# Patient Record
Sex: Female | Born: 1945
Health system: Southern US, Community
[De-identification: ages and names within clinical notes are randomized; demographics above are authoritative.]

## PROBLEM LIST (undated history)

## (undated) DIAGNOSIS — M1990 Unspecified osteoarthritis, unspecified site: Secondary | ICD-10-CM

## (undated) DIAGNOSIS — N390 Urinary tract infection, site not specified: Secondary | ICD-10-CM

## (undated) DIAGNOSIS — E039 Hypothyroidism, unspecified: Secondary | ICD-10-CM

## (undated) DIAGNOSIS — I10 Essential (primary) hypertension: Secondary | ICD-10-CM

## (undated) DIAGNOSIS — F4310 Post-traumatic stress disorder, unspecified: Secondary | ICD-10-CM

## (undated) DIAGNOSIS — I639 Cerebral infarction, unspecified: Secondary | ICD-10-CM

## (undated) DIAGNOSIS — E119 Type 2 diabetes mellitus without complications: Secondary | ICD-10-CM

## (undated) DIAGNOSIS — G4730 Sleep apnea, unspecified: Secondary | ICD-10-CM

## (undated) DIAGNOSIS — E785 Hyperlipidemia, unspecified: Secondary | ICD-10-CM

## (undated) DIAGNOSIS — F259 Schizoaffective disorder, unspecified: Secondary | ICD-10-CM

## (undated) DIAGNOSIS — J449 Chronic obstructive pulmonary disease, unspecified: Secondary | ICD-10-CM

## (undated) DIAGNOSIS — F3130 Bipolar disorder, current episode depressed, mild or moderate severity, unspecified: Secondary | ICD-10-CM

## (undated) DIAGNOSIS — K219 Gastro-esophageal reflux disease without esophagitis: Secondary | ICD-10-CM

## (undated) DIAGNOSIS — E079 Disorder of thyroid, unspecified: Secondary | ICD-10-CM

## (undated) DIAGNOSIS — D649 Anemia, unspecified: Secondary | ICD-10-CM

## (undated) HISTORY — PX: CHOLECYSTECTOMY: SHX55

## (undated) HISTORY — DX: Anemia, unspecified: D64.9

## (undated) HISTORY — PX: BREAST BIOPSY: SHX20

## (undated) HISTORY — DX: Schizoaffective disorder, unspecified: F25.9

## (undated) HISTORY — DX: Hyperlipidemia, unspecified: E78.5

## (undated) HISTORY — DX: Essential (primary) hypertension: I10

## (undated) HISTORY — PX: NASAL SINUS SURGERY: SHX719

## (undated) HISTORY — DX: Chronic obstructive pulmonary disease, unspecified: J44.9

## (undated) HISTORY — PX: COLONOSCOPY: SHX174

## (undated) HISTORY — DX: Bipolar disorder, current episode depressed, mild or moderate severity, unspecified: F31.30

## (undated) HISTORY — PX: REPLACEMENT TOTAL KNEE BILATERAL: SUR1225

## (undated) HISTORY — PX: BREAST SURGERY: SHX581

## (undated) HISTORY — DX: Type 2 diabetes mellitus without complications: E11.9

## (undated) HISTORY — PX: BILATERAL CARPAL TUNNEL RELEASE: SHX6508

## (undated) HISTORY — PX: ESOPHAGOGASTRODUODENOSCOPY: SHX1529

## (undated) HISTORY — PX: CATARACT EXTRACTION W/ INTRAOCULAR LENS  IMPLANT, BILATERAL: SHX1307

## (undated) HISTORY — PX: EYE SURGERY: SHX253

## (undated) HISTORY — DX: Post-traumatic stress disorder, unspecified: F43.10

## (undated) HISTORY — PX: JOINT REPLACEMENT: SHX530

## (undated) HISTORY — DX: Urinary tract infection, site not specified: N39.0

## (undated) HISTORY — DX: Gastro-esophageal reflux disease without esophagitis: K21.9

## (undated) HISTORY — PX: SHOULDER SURGERY: SHX246

---
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
## 2008-05-10 DIAGNOSIS — G459 Transient cerebral ischemic attack, unspecified: Secondary | ICD-10-CM

## 2008-05-10 HISTORY — DX: Transient cerebral ischemic attack, unspecified: G45.9

## 2018-05-10 HISTORY — PX: SHOULDER SURGERY: SHX246

## 2019-12-28 LAB — BASIC METABOLIC PANEL
BUN: 22 — AB (ref 4–21)
Creatinine: 0.9 (ref 0.5–1.1)
Glucose: 80
Potassium: 3.5 (ref 3.4–5.3)
Sodium: 136 — AB (ref 137–147)

## 2019-12-28 LAB — LIPID PANEL
Cholesterol: 282 — AB (ref 0–200)
HDL: 40 (ref 35–70)
LDL Cholesterol: 146
Triglycerides: 673 — AB (ref 40–160)

## 2019-12-28 LAB — CBC AND DIFFERENTIAL
HCT: 41 (ref 36–46)
Hemoglobin: 13.2 (ref 12.0–16.0)
Platelets: 421 — AB (ref 150–399)
WBC: 15.5

## 2019-12-28 LAB — HEPATIC FUNCTION PANEL
ALT: 41 — AB (ref 7–35)
AST: 31 (ref 13–35)
Alkaline Phosphatase: 29 (ref 25–125)

## 2019-12-28 LAB — TSH
TSH: 1.02 (ref 0.41–5.90)
TSH: 5.06 (ref <=5.90)

## 2019-12-28 LAB — HEMOGLOBIN A1C: Hemoglobin A1C: 6.8

## 2020-02-19 ENCOUNTER — Other Ambulatory Visit: Payer: Self-pay

## 2020-02-19 ENCOUNTER — Ambulatory Visit (INDEPENDENT_AMBULATORY_CARE_PROVIDER_SITE_OTHER): Payer: Medicare Other | Admitting: Family Medicine

## 2020-02-19 ENCOUNTER — Encounter: Payer: Self-pay | Admitting: Family Medicine

## 2020-02-19 VITALS — BP 150/80 | HR 80 | Ht 64.0 in | Wt 229.0 lb

## 2020-02-19 DIAGNOSIS — E1159 Type 2 diabetes mellitus with other circulatory complications: Secondary | ICD-10-CM

## 2020-02-19 DIAGNOSIS — F419 Anxiety disorder, unspecified: Secondary | ICD-10-CM

## 2020-02-19 DIAGNOSIS — E034 Atrophy of thyroid (acquired): Secondary | ICD-10-CM | POA: Diagnosis not present

## 2020-02-19 DIAGNOSIS — F324 Major depressive disorder, single episode, in partial remission: Secondary | ICD-10-CM

## 2020-02-19 DIAGNOSIS — D509 Iron deficiency anemia, unspecified: Secondary | ICD-10-CM

## 2020-02-19 DIAGNOSIS — M1990 Unspecified osteoarthritis, unspecified site: Secondary | ICD-10-CM

## 2020-02-19 DIAGNOSIS — J449 Chronic obstructive pulmonary disease, unspecified: Secondary | ICD-10-CM

## 2020-02-19 DIAGNOSIS — F4310 Post-traumatic stress disorder, unspecified: Secondary | ICD-10-CM

## 2020-02-19 DIAGNOSIS — K219 Gastro-esophageal reflux disease without esophagitis: Secondary | ICD-10-CM

## 2020-02-19 DIAGNOSIS — F250 Schizoaffective disorder, bipolar type: Secondary | ICD-10-CM

## 2020-02-19 DIAGNOSIS — Z7689 Persons encountering health services in other specified circumstances: Secondary | ICD-10-CM | POA: Diagnosis not present

## 2020-02-19 DIAGNOSIS — I10 Essential (primary) hypertension: Secondary | ICD-10-CM | POA: Diagnosis not present

## 2020-02-19 DIAGNOSIS — K635 Polyp of colon: Secondary | ICD-10-CM

## 2020-02-19 DIAGNOSIS — E782 Mixed hyperlipidemia: Secondary | ICD-10-CM

## 2020-02-19 DIAGNOSIS — I679 Cerebrovascular disease, unspecified: Secondary | ICD-10-CM | POA: Diagnosis not present

## 2020-02-19 DIAGNOSIS — Q453 Other congenital malformations of pancreas and pancreatic duct: Secondary | ICD-10-CM

## 2020-02-19 DIAGNOSIS — Z794 Long term (current) use of insulin: Secondary | ICD-10-CM

## 2020-02-19 DIAGNOSIS — F5101 Primary insomnia: Secondary | ICD-10-CM

## 2020-02-19 MED ORDER — AMLODIPINE BESYLATE 10 MG PO TABS: 10.0000 mg | ORAL_TABLET | Freq: Every day | ORAL | 0 refills | Status: DC

## 2020-02-19 MED ORDER — FERROUS SULFATE 325 (65 FE) MG PO TABS: 325.0000 mg | ORAL_TABLET | Freq: Every day | ORAL | 0 refills | Status: DC

## 2020-02-19 MED ORDER — LISINOPRIL 5 MG PO TABS: 5.0000 mg | ORAL_TABLET | Freq: Every day | ORAL | 0 refills | Status: DC

## 2020-02-19 MED ORDER — ALBUTEROL SULFATE HFA 108 (90 BASE) MCG/ACT IN AERS: 2.0000 | INHALATION_SPRAY | Freq: Four times a day (QID) | RESPIRATORY_TRACT | 2 refills | Status: DC | PRN

## 2020-02-19 MED ORDER — ONDANSETRON HCL 4 MG PO TABS: 4.0000 mg | ORAL_TABLET | Freq: Two times a day (BID) | ORAL | 1 refills | Status: DC

## 2020-02-19 MED ORDER — CYCLOBENZAPRINE HCL 10 MG PO TABS: 10.0000 mg | ORAL_TABLET | Freq: Three times a day (TID) | ORAL | 3 refills | Status: DC | PRN

## 2020-02-19 MED ORDER — MELOXICAM 7.5 MG PO TABS: 7.5000 mg | ORAL_TABLET | Freq: Every day | ORAL | 0 refills | Status: AC

## 2020-02-19 MED ORDER — OMEPRAZOLE 40 MG PO CPDR: 40.0000 mg | DELAYED_RELEASE_CAPSULE | Freq: Every day | ORAL | 0 refills | Status: DC

## 2020-02-19 MED ORDER — SIMVASTATIN 20 MG PO TABS: 20.0000 mg | ORAL_TABLET | Freq: Every day | ORAL | 0 refills | Status: DC

## 2020-02-19 MED ORDER — HYDROCHLOROTHIAZIDE 25 MG PO TABS: 25.0000 mg | ORAL_TABLET | Freq: Every day | ORAL | 0 refills | Status: DC

## 2020-02-19 MED ORDER — METOPROLOL SUCCINATE ER 100 MG PO TB24: 100.0000 mg | ORAL_TABLET | Freq: Every day | ORAL | 0 refills | Status: DC

## 2020-02-19 MED ORDER — FENOFIBRATE 145 MG PO TABS: 145.0000 mg | ORAL_TABLET | Freq: Every day | ORAL | 0 refills | Status: DC

## 2020-02-19 MED ORDER — LEVOTHYROXINE SODIUM 175 MCG PO TABS: 175.0000 ug | ORAL_TABLET | Freq: Every day | ORAL | 0 refills | Status: DC

## 2020-02-19 NOTE — Patient Instructions (Signed)
Carbohydrate Counting for Diabetes Mellitus, Adult  Carbohydrate counting is a method of keeping track of how many carbohydrates you eat. Eating carbohydrates naturally increases the amount of sugar (glucose) in the blood. Counting how many carbohydrates you eat helps keep your blood glucose within normal limits, which helps you manage your diabetes (diabetes mellitus). It is important to know how many carbohydrates you can safely have in each meal. This is different for every person. A diet and nutrition specialist (registered dietitian) can help you make a meal plan and calculate how many carbohydrates you should have at each meal and snack. Carbohydrates are found in the following foods:  Grains, such as breads and cereals.  Dried beans and soy products.  Starchy vegetables, such as potatoes, peas, and corn.  Fruit and fruit juices.  Milk and yogurt.  Sweets and snack foods, such as cake, cookies, candy, chips, and soft drinks. How do I count carbohydrates? There are two ways to count carbohydrates in food. You can use either of the methods or a combination of both. Reading "Nutrition Facts" on packaged food The "Nutrition Facts" list is included on the labels of almost all packaged foods and beverages in the [place] It includes:  The serving size.  Information about nutrients in each serving, including the grams (g) of carbohydrate per serving. To use the "Nutrition Facts":  Decide how many servings you will have.  Multiply the number of servings by the number of carbohydrates per serving.  The resulting number is the total amount of carbohydrates that you will be having. Learning standard serving sizes of other foods When you eat carbohydrate foods that are not packaged or do not include "Nutrition Facts" on the label, you need to measure the servings in order to count the amount of carbohydrates:  Measure the foods that you will eat with a food scale or measuring cup, if  needed.  Decide how many standard-size servings you will eat.  Multiply the number of servings by 15. Most carbohydrate-rich foods have about 15 g of carbohydrates per serving. ? For example, if you eat 8 oz (170 g) of strawberries, you will have eaten 2 servings and 30 g of carbohydrates (2 servings x 15 g = 30 g).  For foods that have more than one food mixed, such as soups and casseroles, you must count the carbohydrates in each food that is included. The following list contains standard serving sizes of common carbohydrate-rich foods. Each of these servings has about 15 g of carbohydrates:   hamburger bun or  English muffin.   oz (15 mL) syrup.   oz (14 g) jelly.  1 slice of bread.  1 six-inch tortilla.  3 oz (85 g) cooked rice or pasta.  4 oz (113 g) cooked dried beans.  4 oz (113 g) starchy vegetable, such as peas, corn, or potatoes.  4 oz (113 g) hot cereal.  4 oz (113 g) mashed potatoes or  of a large baked potato.  4 oz (113 g) canned or frozen fruit.  4 oz (120 mL) fruit juice.  4-6 crackers.  6 chicken nuggets.  6 oz (170 g) unsweetened dry cereal.  6 oz (170 g) plain fat-free yogurt or yogurt sweetened with artificial sweeteners.  8 oz (240 mL) milk.  8 oz (170 g) fresh fruit or one small piece of fruit.  24 oz (680 g) popped popcorn. Example of carbohydrate counting Sample meal  3 oz (85 g) chicken breast.  6 oz (170 g)   brown rice.  4 oz (113 g) corn.  8 oz (240 mL) milk.  8 oz (170 g) strawberries with sugar-free whipped topping. Carbohydrate calculation 1. Identify the foods that contain carbohydrates: ? Rice. ? Corn. ? Milk. ? Strawberries. 2. Calculate how many servings you have of each food: ? 2 servings rice. ? 1 serving corn. ? 1 serving milk. ? 1 serving strawberries. 3. Multiply each number of servings by 15 g: ? 2 servings rice x 15 g = 30 g. ? 1 serving corn x 15 g = 15 g. ? 1 serving milk x 15 g = 15 g. ? 1  serving strawberries x 15 g = 15 g. 4. Add together all of the amounts to find the total grams of carbohydrates eaten: ? 30 g + 15 g + 15 g + 15 g = 75 g of carbohydrates total. Summary  Carbohydrate counting is a method of keeping track of how many carbohydrates you eat.  Eating carbohydrates naturally increases the amount of sugar (glucose) in the blood.  Counting how many carbohydrates you eat helps keep your blood glucose within normal limits, which helps you manage your diabetes.  A diet and nutrition specialist (registered dietitian) can help you make a meal plan and calculate how many carbohydrates you should have at each meal and snack. This information is not intended to replace advice given to you by your health care provider. Make sure you discuss any questions you have with your health care provider. Document Revised: 11/18/2016 Document Reviewed: 10/08/2015 Elsevier Patient Education  2020 Elsevier Inc.  

## 2020-02-19 NOTE — Progress Notes (Signed)
Date:  02/19/2020   Name:  Angela Fernandez   DOB:  Jun 26, 1945   MRN:  [id number]   Chief Complaint: Establish Care  Patient is a 74 year old female who presents for a establish care exam. The patient reports the following problems: multiple. Health maintenance has been reviewed immunization.   No results found for: CREATININE, BUN, NA, K, CL, CO2 No results found for: CHOL, HDL, LDLCALC, LDLDIRECT, TRIG, CHOLHDL No results found for: TSH No results found for: HGBA1C No results found for: WBC, HGB, HCT, MCV, PLT No results found for: ALT, AST, GGT, ALKPHOS, BILITOT   Review of Systems  Constitutional: Negative.  Negative for chills, fatigue, fever and unexpected weight change.  HENT: Positive for mouth sores. Negative for congestion, ear discharge, ear pain, rhinorrhea, sinus pressure, sneezing and sore throat.   Eyes: Negative for photophobia, pain, discharge, redness and itching.  Respiratory: Negative for cough, shortness of breath, wheezing and stridor.   Cardiovascular: Negative for chest pain, palpitations and leg swelling.  Gastrointestinal: Negative for abdominal pain, blood in stool, constipation, diarrhea, nausea and vomiting.  Endocrine: Negative for cold intolerance, heat intolerance, polydipsia, polyphagia and polyuria.  Genitourinary: Negative for dysuria, flank pain, frequency, hematuria, menstrual problem, pelvic pain, urgency, vaginal bleeding and vaginal discharge.  Musculoskeletal: Positive for arthralgias and back pain. Negative for myalgias.  Skin: Negative for rash.  Allergic/Immunologic: Negative for environmental allergies and food allergies.  Neurological: Negative for dizziness, weakness, light-headedness, numbness and headaches.  Hematological: Negative for adenopathy. Does not bruise/bleed easily.  Psychiatric/Behavioral: Negative for dysphoric mood. The patient is not nervous/anxious.     There are no problems to display for this patient.   No Known  Allergies  Past Surgical History:  Procedure Laterality Date  . BREAST SURGERY     lumpectomy  . CHOLECYSTECTOMY    . SHOULDER SURGERY      Social History   Tobacco Use  . Smoking status: Former Smoker    Years: 54.00    Types: Cigarettes    Quit date: 05/10/2016    Years since quitting: 3.7  . Smokeless tobacco: Never Used  Substance Use Topics  . Alcohol use: Not Currently  . Drug use: Never     Medication list has been reviewed and updated.  Current Meds  Medication Sig  . acetaminophen (TYLENOL) 325 MG tablet Take 650 mg by mouth every 4 (four) hours as needed.  Marland Kitchen amLODipine (NORVASC) 10 MG tablet Take 10 mg by mouth daily. Take one-half tablet by mouth in the morning  . buPROPion (WELLBUTRIN SR) 200 MG 12 hr tablet Take 200 mg by mouth 2 (two) times daily. Take one and half tablet in the morning and 1 tablet in the evening/psych  . clonazePAM (KLONOPIN) 1 MG tablet Take 4.5 mg by mouth daily. Take 2 tabs in the am, 1.5 tabs at noon, and 1 tab at bedtime/ psych  . cyclobenzaprine (FLEXERIL) 10 MG tablet Take 10 mg by mouth 3 (three) times daily as needed for muscle spasms.  Marland Kitchen docusate sodium (COLACE) 100 MG capsule Take 200 mg by mouth 2 (two) times daily.  . Dulaglutide (TRULICITY) 6.31 SH/7.73YO SOPN Inject 0.75 mg into the skin once a week.  . fenofibrate (TRICOR) 145 MG tablet Take 145 mg by mouth daily.  . ferrous sulfate 325 (65 FE) MG tablet Take 325 mg by mouth daily with breakfast.  . hydrochlorothiazide (HYDRODIURIL) 25 MG tablet Take 25 mg by mouth daily.  . hydrOXYzine (  ATARAX/VISTARIL) 25 MG tablet Take 25 mg by mouth 3 (three) times daily as needed.  . Insulin Aspart (NOVOLOG FLEXPEN [place]) Inject 15 Units into the skin daily. After biggest meal of the day  . insulin glargine (LANTUS SOLOSTAR) 100 UNIT/ML Solostar Pen Inject 30 Units into the skin daily. 30 units daily into skin  . levothyroxine (SYNTHROID) 175 MCG tablet Take 175 mcg by mouth daily before  breakfast.  . lisinopril (ZESTRIL) 5 MG tablet Take 5 mg by mouth daily.  . Lurasidone HCl (LATUDA) 120 MG TABS Take 1 tablet by mouth daily. psych  . meloxicam (MOBIC) 7.5 MG tablet Take 7.5 mg by mouth daily.  . metoprolol succinate (TOPROL-XL) 100 MG 24 hr tablet Take 100 mg by mouth daily. Take with or immediately following a meal.  . Multiple Vitamins-Iron (MULTIVITAMINS WITH IRON) TABS tablet Take 1 tablet by mouth daily.  . Multiple Vitamins-Minerals (HAIR SKIN AND NAILS FORMULA PO) Take 1 tablet by mouth daily.  . naltrexone (DEPADE) 50 MG tablet Take 25 mg by mouth in the morning and at bedtime.  Marland Kitchen omeprazole (PRILOSEC) 40 MG capsule Take 40 mg by mouth daily.  . ondansetron (ZOFRAN) 4 MG tablet Take 4 mg by mouth 2 (two) times daily. One tab twice a day As needed  . promethazine (PHENERGAN) 12.5 MG tablet Take 12.5 mg by mouth every 8 (eight) hours as needed for nausea or vomiting.  . simvastatin (ZOCOR) 20 MG tablet Take 20 mg by mouth daily.  . traZODone (DESYREL) 50 MG tablet Take 50 mg by mouth at bedtime. Take one tablet at bedtime/ psych  . venlafaxine (EFFEXOR) 50 MG tablet Take 50 mg by mouth daily. 1 tablet daily/ psych  . zolpidem (AMBIEN) 10 MG tablet Take 10 mg by mouth at bedtime as needed for sleep. 1 tablet at bedtime as needed/ psych    PHQ 2/9 Scores 02/19/2020  PHQ - 2 Score 3  PHQ- 9 Score 8    GAD 7 : Generalized Anxiety Score 02/19/2020  Nervous, Anxious, on Edge 0  Control/stop worrying 1  Worry too much - different things 0  Trouble relaxing 0  Restless 0  Easily annoyed or irritable 0  Afraid - awful might happen 0  Total GAD 7 Score 1  Anxiety Difficulty Somewhat difficult    BP Readings from Last 3 Encounters:  02/19/20 (!) 160/100    Physical Exam Vitals and nursing note reviewed.  Constitutional:      General: She is not in acute distress.    Appearance: She is not diaphoretic.  HENT:     Head: Normocephalic and atraumatic.     Right  Ear: Tympanic membrane, ear canal and external ear normal.     Left Ear: Tympanic membrane, ear canal and external ear normal.     Nose: Nose normal.  Eyes:     General:        Right eye: No discharge.        Left eye: No discharge.     Conjunctiva/sclera: Conjunctivae normal.     Pupils: Pupils are equal, round, and reactive to light.  Neck:     Thyroid: No thyromegaly.     Vascular: No JVD.  Cardiovascular:     Rate and Rhythm: Normal rate and regular rhythm.     Heart sounds: Normal heart sounds. No murmur heard.  No friction rub. No gallop.   Pulmonary:     Effort: Pulmonary effort is normal.  Breath sounds: Normal breath sounds. No wheezing or rhonchi.  Abdominal:     General: Bowel sounds are normal.     Palpations: Abdomen is soft. There is no mass.     Tenderness: There is no abdominal tenderness. There is no guarding.  Musculoskeletal:        General: Normal range of motion.     Cervical back: Normal range of motion and neck supple.  Lymphadenopathy:     Cervical: No cervical adenopathy.  Skin:    General: Skin is warm and dry.  Neurological:     Mental Status: She is alert.     Deep Tendon Reflexes: Reflexes are normal and symmetric.     Wt Readings from Last 3 Encounters:  02/19/20 229 lb (103.9 kg)    BP (!) 160/100   Pulse 80   Ht 5\' 4"  (1.626 m)   Wt 229 lb (103.9 kg)   BMI 39.31 kg/m   Assessment and Plan:  1. Establishing care with new doctor, encounter for Patient chart was not available for review of previous encounters.  There were no previous labs, imaging, nor care everywhere available.  2. Type 2 diabetes mellitus with other circulatory complication, with long-term current use of insulin (HCC) Chronic.  Controlled.  Stable.  Patient is on a following regimen including Lantus 30 units nightly day.  Patient also takes NovoLog 15 units with the biggest meal the day for what may be a blood sugar greater than a certain level that has been  ascribed is at home.  Trulicity 3.38 mg/mL to inject 0.75 mg and sent once a week. - insulin glargine (LANTUS SOLOSTAR) 100 UNIT/ML Solostar Pen; Inject 30 Units into the skin daily. 30 units daily into skin - Insulin Aspart (NOVOLOG FLEXPEN [place]); Inject 15 Units into the skin daily. After biggest meal of the day - Dulaglutide (TRULICITY) 2.50 NL/9.7QB SOPN; Inject 0.75 mg into the skin once a week. - Ambulatory referral to Endocrinology  3. Cerebral vascular disease Chronic.  Controlled.  Stable.  Patient states she has a history of TIAs for which this is controlled with control of blood pressure, control of hyperlipidemia.  I am uncertain if she is currently on low-dose aspirin.  4. Hypothyroidism due to acquired atrophy of thyroid Chronic.  Controlled.  Stable.  We will continue 175 mcg levothyroxine daily. - levothyroxine (SYNTHROID) 175 MCG tablet; Take 1 tablet (175 mcg total) by mouth daily before breakfast.  Dispense: 90 tablet; Refill: 0  5. Primary hypertension Chronic.  Controlled.  Stable.  Continue lisinopril 5 mg once a day, hydrochlorothiazide 25 mg once a day, metoprolol XL 100 mg once a day, and amlodipine 10 mg 1 tablet 1/2 tablet in the morning. - amLODipine (NORVASC) 10 MG tablet; . Take one-half tablet by mouth in the morning  Dispense: 45 tablet; Refill: 0 - metoprolol succinate (TOPROL-XL) 100 MG 24 hr tablet; Take 1 tablet (100 mg total) by mouth daily. Take with or immediately following a meal.  Dispense: 90 tablet; Refill: 0 - hydrochlorothiazide (HYDRODIURIL) 25 MG tablet; Take 1 tablet (25 mg total) by mouth daily.  Dispense: 90 tablet; Refill: 0 - lisinopril (ZESTRIL) 5 MG tablet; Take 1 tablet (5 mg total) by mouth daily.  Dispense: 90 tablet; Refill: 0   6. Mixed hyperlipidemia Chronic.  Controlled.  Stable.  Continue fenofibrate 145 mg once a day and simvastatin 20 mg once a day. - fenofibrate (TRICOR) 145 MG tablet; Take 1 tablet (145 mg total) by mouth  daily.   Dispense: 90 tablet; Refill: 0 - simvastatin (ZOCOR) 20 MG tablet; Take 1 tablet (20 mg total) by mouth daily.  Dispense: 90 tablet; Refill: 0  7. Arthritis Chronic.  Controlled.  Stable.  Patient has history of shoulder pain where she had a rotator cuff injury as well as some back discomfort.  She will continue Flexeril as prescribed 3 times a day as needed and meloxicam 7.5 mg once a day. - cyclobenzaprine (FLEXERIL) 10 MG tablet; Take 1 tablet (10 mg total) by mouth 3 (three) times daily as needed for muscle spasms.  Dispense: 30 tablet; Refill: 3 - meloxicam (MOBIC) 7.5 MG tablet; Take 1 tablet (7.5 mg total) by mouth daily.  Dispense: 90 tablet; Refill: 0  8. Iron deficiency anemia, unspecified iron deficiency anemia type She with history of iron deficiency anemia for which she will continue ferrous sulfate 325 mg once every morning - ferrous sulfate 325 (65 FE) MG tablet; Take 1 tablet (325 mg total) by mouth daily with breakfast.  Dispense: 90 tablet; Refill: 0  9. Major depressive disorder in partial remission, unspecified whether recurrent [facility]) Patient with a history of depression for which she is followed by psychiatry [place].  Patient is on multiple medications and we will refer to psychiatry for continuance.  10. Anxiety Patient is on multiple medications for anxiety including controlled clonazepam.  This is been discussed with patient that this will need to be handled by psychiatry and this is been given referral information to secure a psychiatrist.  11. Schizoaffective disorder, bipolar type [facility]) Patient states she has a schizoaffective disorder and bipolar by her history.  For this she will be referred to psychiatry and information has been given for this for obtaining referral.  12. PTSD (post-traumatic stress disorder) Patient also states she has a PTSD disorder for which she will be referred to psychiatry for evaluation.  13. Primary insomnia Patient has and primary  insomnia for which she takes Ambien which is a controlled drug in which will be referred to psychiatry for further continuance as well as hydroxyzine on an as-needed basis.  Patient is also on there is a refill.  14. Chronic obstructive pulmonary disease, unspecified COPD type ([place]) Chronic.  Controlled.  Intermittent.  Patient will be given albuterol 1 to 2 puffs as needed.  15. Pancreatic ductal abnormality Patient states that she has some type of pancreatic duct abnormal for which she has been having endoscopy on yearly basis.  We will refer to gastroenterology in the meantime we are sending a release of information to her gastroenterologist in [place] for clarification.  - Ambulatory referral to Gastroenterology  16. Polyp of colon, unspecified part of colon, unspecified type There is some confusion as to why she was also getting colonoscopies on an annual basis and we will be sending for clarification with her gastroenterology in [place].  In the meantime referral has been placed with gastroenterology for continuance of surveillance. . - Ambulatory referral to Gastroenterology  17. Gastroesophageal reflux disease, unspecified whether esophagitis present Noted above patient has history of GERD and we will continue Prilosec 40 mg once a day. - ondansetron (ZOFRAN) 4 MG tablet; Take 1 tablet (4 mg total) by mouth 2 (two) times daily. One tab twice a day As needed  Dispense: 20 tablet; Refill: 1 - omeprazole (PRILOSEC) 40 MG capsule; Take 1 capsule (40 mg total) by mouth daily.  Dispense: 90 capsule; Refill: 0  I spent 60 minutes with this patient,  More than 50% of that time was spent in face to face education, counseling and care coordination.

## 2020-02-24 ENCOUNTER — Encounter: Payer: Self-pay | Admitting: Family Medicine

## 2020-02-25 ENCOUNTER — Other Ambulatory Visit: Payer: Self-pay

## 2020-02-25 DIAGNOSIS — M1990 Unspecified osteoarthritis, unspecified site: Secondary | ICD-10-CM

## 2020-02-25 DIAGNOSIS — J449 Chronic obstructive pulmonary disease, unspecified: Secondary | ICD-10-CM

## 2020-02-25 MED ORDER — AMOXICILLIN 500 MG PO CAPS: 2000.0000 mg | ORAL_CAPSULE | Freq: Every day | ORAL | 0 refills | Status: DC

## 2020-02-25 MED ORDER — TRAMADOL HCL 50 MG PO TABS: 50.0000 mg | ORAL_TABLET | Freq: Two times a day (BID) | ORAL | 0 refills | Status: AC | PRN

## 2020-02-25 MED ORDER — TRELEGY ELLIPTA 100-62.5-25 MCG/INH IN AEPB: 1.0000 | INHALATION_SPRAY | Freq: Every day | RESPIRATORY_TRACT | 0 refills | Status: DC

## 2020-02-25 NOTE — Progress Notes (Unsigned)
Added in Amox, Trellegy and Tramadol to pt's drug list- did not prescribe

## 2020-03-05 ENCOUNTER — Encounter: Payer: Self-pay | Admitting: Family Medicine

## 2020-03-06 ENCOUNTER — Other Ambulatory Visit: Payer: Self-pay

## 2020-03-06 ENCOUNTER — Telehealth: Payer: Self-pay | Admitting: Family Medicine

## 2020-03-06 NOTE — Telephone Encounter (Signed)
Pt needs a refill for one touch verio test strips / please advise and send to  [facility] [place] Phone:  [phone]  Fax:  [phone]

## 2020-03-06 NOTE — Telephone Encounter (Signed)
Spoke to pharm and faxed script for test strips to [phone]

## 2020-03-08 ENCOUNTER — Encounter: Payer: Self-pay | Admitting: Family Medicine

## 2020-03-17 ENCOUNTER — Encounter: Payer: Self-pay | Admitting: Family Medicine

## 2020-03-17 ENCOUNTER — Other Ambulatory Visit: Payer: Self-pay

## 2020-03-17 DIAGNOSIS — F324 Major depressive disorder, single episode, in partial remission: Secondary | ICD-10-CM

## 2020-03-17 DIAGNOSIS — F250 Schizoaffective disorder, bipolar type: Secondary | ICD-10-CM

## 2020-03-17 DIAGNOSIS — F419 Anxiety disorder, unspecified: Secondary | ICD-10-CM

## 2020-03-17 NOTE — Progress Notes (Unsigned)
Referral placed to [facility]

## 2020-03-21 ENCOUNTER — Telehealth: Payer: Self-pay

## 2020-03-21 ENCOUNTER — Encounter: Payer: Self-pay | Admitting: Family Medicine

## 2020-03-21 NOTE — Telephone Encounter (Signed)
I called daughter and left message concerning the [employer] message I received. I am trying to help her get her mother in somewhere. I have asked the CMA to see if they can help [place] with that. Also, I advised the daughter to see if they can get her in by getting an appt rather than a walk-in basis. Also, advised them to call customer service line on back of card and ask who is in network.

## 2020-04-12 DIAGNOSIS — R300 Dysuria: Secondary | ICD-10-CM | POA: Diagnosis not present

## 2020-04-18 ENCOUNTER — Ambulatory Visit: Payer: Medicare Other | Admitting: Family Medicine

## 2020-04-18 ENCOUNTER — Other Ambulatory Visit: Payer: Self-pay

## 2020-04-18 ENCOUNTER — Encounter: Payer: Self-pay | Admitting: Family Medicine

## 2020-04-18 ENCOUNTER — Ambulatory Visit (INDEPENDENT_AMBULATORY_CARE_PROVIDER_SITE_OTHER): Payer: Medicare Other | Admitting: Family Medicine

## 2020-04-18 VITALS — BP 110/74 | HR 69 | Ht 64.0 in | Wt 215.0 lb

## 2020-04-18 DIAGNOSIS — G8929 Other chronic pain: Secondary | ICD-10-CM

## 2020-04-18 DIAGNOSIS — E1159 Type 2 diabetes mellitus with other circulatory complications: Secondary | ICD-10-CM | POA: Diagnosis not present

## 2020-04-18 DIAGNOSIS — I10 Essential (primary) hypertension: Secondary | ICD-10-CM

## 2020-04-18 DIAGNOSIS — M25511 Pain in right shoulder: Secondary | ICD-10-CM

## 2020-04-18 DIAGNOSIS — E782 Mixed hyperlipidemia: Secondary | ICD-10-CM

## 2020-04-18 DIAGNOSIS — Z79899 Other long term (current) drug therapy: Secondary | ICD-10-CM | POA: Diagnosis not present

## 2020-04-18 DIAGNOSIS — Z794 Long term (current) use of insulin: Secondary | ICD-10-CM

## 2020-04-18 MED ORDER — GLUCAGON EMERGENCY 1 MG IJ KIT: 1.0000 | PACK | INTRAMUSCULAR | 0 refills | Status: DC | PRN

## 2020-04-18 MED ORDER — LANTUS SOLOSTAR 100 UNIT/ML ~~LOC~~ SOPN: 30.0000 [IU] | PEN_INJECTOR | Freq: Every day | SUBCUTANEOUS | 1 refills | Status: DC

## 2020-04-18 MED ORDER — TRULICITY 0.75 MG/0.5ML ~~LOC~~ SOAJ: 0.7500 mg | SUBCUTANEOUS | 0 refills | Status: DC

## 2020-04-18 NOTE — Progress Notes (Signed)
Date:  04/18/2020   Name:  Angela Fernandez   DOB:  Dec 10, 1945   MRN:  [id number]   Chief Complaint: Diabetes and Shoulder Pain (Ref to ortho)  Diabetes She presents for her follow-up diabetic visit. She has type 2 diabetes mellitus. Her disease course has been stable. There are no hypoglycemic associated symptoms. Pertinent negatives for diabetes include no chest pain, no fatigue, no foot paresthesias, no foot ulcerations, no polydipsia, no polyphagia, no polyuria, no visual change, no weakness and no weight loss. There are no hypoglycemic complications. Symptoms are stable. There are no diabetic complications. Current diabetic treatments: lantis/novalog/trulicity. She is compliant with treatment most of the time. She is following a generally healthy diet. Meal planning includes avoidance of concentrated sweets and carbohydrate counting. An ACE inhibitor/angiotensin II receptor blocker is being taken.  Shoulder Pain  The pain is present in the right shoulder. This is a chronic problem. The current episode started more than 1 year ago. The problem occurs daily. The problem has been gradually worsening (carriing groceries). The quality of the pain is described as aching. The pain is moderate. Pertinent negatives include no fever, inability to bear weight, itching, joint locking, joint swelling, limited range of motion, numbness, stiffness or tingling. The symptoms are aggravated by activity. She has tried NSAIDS for the symptoms.  Hypertension This is a chronic problem. The current episode started more than 1 year ago. The problem has been gradually improving since onset. The problem is controlled (110/74). Pertinent negatives include no chest pain or shortness of breath. Past treatments include beta blockers, calcium channel blockers, ACE inhibitors and diuretics. The current treatment provides moderate improvement.  Hyperlipidemia This is a chronic problem. The current episode started more than 1  year ago. Factors aggravating her hyperlipidemia include thiazides. Pertinent negatives include no chest pain or shortness of breath. Current antihyperlipidemic treatment includes statins. The current treatment provides moderate improvement of lipids. There are no compliance problems.  Risk factors for coronary artery disease include diabetes mellitus, dyslipidemia and hypertension.    Lab Results  Component Value Date   CREATININE 0.9 12/28/2019   BUN 22 (A) 12/28/2019   NA 136 (A) 12/28/2019   K 3.5 12/28/2019   Lab Results  Component Value Date   CHOL 282 (A) 12/28/2019   HDL 40 12/28/2019   LDLCALC 146 12/28/2019   TRIG 673 (A) 12/28/2019   Lab Results  Component Value Date   TSH 1.02 12/28/2019   TSH 5.06 12/28/2019   Lab Results  Component Value Date   HGBA1C 6.8 12/28/2019   Lab Results  Component Value Date   WBC 15.5 12/28/2019   HGB 13.2 12/28/2019   HCT 41 12/28/2019   PLT 421 (A) 12/28/2019   Lab Results  Component Value Date   ALT 41 (A) 12/28/2019   AST 31 12/28/2019   ALKPHOS 29 12/28/2019     Review of Systems  Constitutional: Negative for fatigue, fever and weight loss.  Respiratory: Negative for shortness of breath.   Cardiovascular: Negative for chest pain.  Endocrine: Negative for polydipsia, polyphagia and polyuria.  Musculoskeletal: Negative for stiffness.  Skin: Negative for itching.  Neurological: Negative for tingling, weakness and numbness.    There are no problems to display for this patient.   No Known Allergies  Past Surgical History:  Procedure Laterality Date  . BREAST SURGERY     lumpectomy  . CHOLECYSTECTOMY    . SHOULDER SURGERY      Social History  Tobacco Use  . Smoking status: Former Smoker    Years: 54.00    Types: Cigarettes    Quit date: 05/10/2016    Years since quitting: 3.9  . Smokeless tobacco: Never Used  Substance Use Topics  . Alcohol use: Not Currently  . Drug use: Never     Medication list  has been reviewed and updated.  Current Meds  Medication Sig  . acetaminophen (TYLENOL) 325 MG tablet Take 650 mg by mouth every 4 (four) hours as needed.  Marland Kitchen albuterol (VENTOLIN HFA) 108 (90 Base) MCG/ACT inhaler Inhale 2 puffs into the lungs every 6 (six) hours as needed for wheezing or shortness of breath.  Marland Kitchen amLODipine (NORVASC) 10 MG tablet Take 1 tablet (10 mg total) by mouth daily. Take one-half tablet by mouth in the morning /please give the dose is only 1/2 tablet a day and disregard 1 tablet a day.  Marland Kitchen buPROPion (WELLBUTRIN SR) 200 MG 12 hr tablet Take 1 tablet (200 mg total) by mouth 2 (two) times daily. Take 1 bid  . clonazePAM (KLONOPIN) 1 MG tablet Take 3.5 mg by mouth daily. Take 2 tabs in the am, 1.5 tabs at noon, and 1 tab at bedtime/ psych  . cyclobenzaprine (FLEXERIL) 10 MG tablet Take 1 tablet (10 mg total) by mouth 3 (three) times daily as needed for muscle spasms.  Marland Kitchen desvenlafaxine (PRISTIQ) 50 MG 24 hr tablet Take 50 mg by mouth daily.  Marland Kitchen docusate sodium (COLACE) 100 MG capsule Take 200 mg by mouth 2 (two) times daily.  . Dulaglutide (TRULICITY) 3.41 PF/7.9KW SOPN Inject 0.75 mg into the skin once a week.  . fenofibrate (TRICOR) 145 MG tablet Take 1 tablet (145 mg total) by mouth daily.  . ferrous sulfate 325 (65 FE) MG tablet Take 1 tablet (325 mg total) by mouth daily with breakfast.  . Fluticasone-Umeclidin-Vilant (TRELEGY ELLIPTA) 100-62.5-25 MCG/INH AEPB Inhale 1 puff into the lungs daily.  . hydrochlorothiazide (HYDRODIURIL) 25 MG tablet Take 1 tablet (25 mg total) by mouth daily.  . Insulin Aspart (NOVOLOG FLEXPEN [place]) Inject 15 Units into the skin daily. After biggest meal of the day  . insulin glargine (LANTUS SOLOSTAR) 100 UNIT/ML Solostar Pen Inject 30 Units into the skin daily. 30 units daily into skin  . levothyroxine (SYNTHROID) 175 MCG tablet Take 1 tablet (175 mcg total) by mouth daily before breakfast.  . lisinopril (ZESTRIL) 5 MG tablet Take 1 tablet (5 mg  total) by mouth daily.  . Lurasidone HCl (LATUDA) 120 MG TABS Take 1 tablet by mouth daily. psych  . meloxicam (MOBIC) 7.5 MG tablet Take 1 tablet (7.5 mg total) by mouth daily.  . metoprolol succinate (TOPROL-XL) 100 MG 24 hr tablet Take 1 tablet (100 mg total) by mouth daily. Take with or immediately following a meal.  . Multiple Vitamins-Iron (MULTIVITAMINS WITH IRON) TABS tablet Take 1 tablet by mouth daily.  . Multiple Vitamins-Minerals (HAIR SKIN AND NAILS FORMULA PO) Take 1 tablet by mouth daily.  Marland Kitchen omeprazole (PRILOSEC) 40 MG capsule Take 1 capsule (40 mg total) by mouth daily.  . simvastatin (ZOCOR) 20 MG tablet Take 1 tablet (20 mg total) by mouth daily.  . traZODone (DESYREL) 50 MG tablet Take 50 mg by mouth at bedtime. Take one tablet at bedtime/ psych  . zolpidem (AMBIEN) 5 MG tablet Take 5 mg by mouth at bedtime as needed for sleep. 1 tablet at bedtime as needed/ psych    [facility] 2/9 Scores 02/19/2020  PHQ -  2 Score 3  PHQ- 9 Score 8    GAD 7 : Generalized Anxiety Score 02/19/2020  Nervous, Anxious, on Edge 0  Control/stop worrying 1  Worry too much - different things 0  Trouble relaxing 0  Restless 0  Easily annoyed or irritable 0  Afraid - awful might happen 0  Total GAD 7 Score 1  Anxiety Difficulty Somewhat difficult    BP Readings from Last 3 Encounters:  04/18/20 110/74  02/19/20 (!) 150/80    Physical Exam Vitals and nursing note reviewed.  Constitutional:      General: She is not in acute distress.    Appearance: She is not diaphoretic.  HENT:     Head: Normocephalic and atraumatic.     Right Ear: External ear normal.     Left Ear: External ear normal.     Nose: Nose normal.     Mouth/Throat:     Mouth: Oropharynx is clear and moist.  Eyes:     General:        Right eye: No discharge.        Left eye: No discharge.     Extraocular Movements: EOM normal.     Conjunctiva/sclera: Conjunctivae normal.     Pupils: Pupils are equal, round, and reactive  to light.  Neck:     Thyroid: No thyromegaly.     Vascular: No JVD.  Cardiovascular:     Rate and Rhythm: Normal rate and regular rhythm.     Pulses: Intact distal pulses.     Heart sounds: Normal heart sounds. No murmur heard. No friction rub. No gallop.   Pulmonary:     Effort: Pulmonary effort is normal.     Breath sounds: Normal breath sounds. No decreased breath sounds, wheezing, rhonchi or rales.  Abdominal:     General: Bowel sounds are normal.     Palpations: Abdomen is soft. There is no mass.     Tenderness: There is no abdominal tenderness. There is no guarding.  Musculoskeletal:        General: No edema. Normal range of motion.     Cervical back: Normal range of motion and neck supple.  Lymphadenopathy:     Cervical: No cervical adenopathy.  Skin:    General: Skin is warm and dry.  Neurological:     Mental Status: She is alert.     Deep Tendon Reflexes: Reflexes are normal and symmetric.     Wt Readings from Last 3 Encounters:  04/18/20 215 lb (97.5 kg)  02/19/20 229 lb (103.9 kg)    BP 110/74   Pulse 69   Ht 5' 4" (1.626 m)   Wt 215 lb (97.5 kg)   SpO2 93%   BMI 36.90 kg/m   Assessment and Plan:  1. Type 2 diabetes mellitus with other circulatory complication, with long-term current use of insulin (HCC) Chronic.  Controlled.  Stable.  Patient is doing extremely well with blood sugars in the 1 60-1 20 range fasting.  Patient had one episode of hypoglycemia but this was probably secondary to mixing up her NovoLog with her Lantus.  We will obtain an A1c around December 20 when patient can come back to have labs charts we have been unable to obtain labs today due to timeframe.  In the meantime we will continue Trulicity 0.45 per 5 cc once a week.  Continuing her Lantus 30 units daily and NovoLog it would appear 15 units given with perhaps the most significant meal of  the day.  We will recheck patient in 4 months. - Dulaglutide (TRULICITY) 6.21 HY/8.6VH SOPN;  Inject 0.75 mg into the skin once a week.  Dispense: 2 mL; Refill: 0 - insulin glargine (LANTUS SOLOSTAR) 100 UNIT/ML Solostar Pen; Inject 30 Units into the skin daily. 30 units daily into skin  Dispense: 15 mL; Refill: 1 - Glucagon, rDNA, (GLUCAGON EMERGENCY) 1 MG KIT; Inject 1 Device as directed as needed (hypoglycemia).  Dispense: 1 kit; Refill: 0  2. Chronic right shoulder pain Chronic.  Uncontrolled pain.  Limited range of motion.  Patient had a previous surgery for which she was rehab however while carrying groceries upstairs she may have reinjured this sometime ago.  Currently patient has pain for which previously she has been on tramadol and meloxicam and Flexeril.  I have instructed them to discontinue her Flexeril but I do not think that there is a significant muscle spasm component and this is only contributing to her risk for falls and sedation. - Ambulatory referral to Orthopedic Surgery  3. Primary hypertension Chronic.  Controlled.  Stable.  Patient is on metoprolol XL 100 mg 1 a day.  Amlodipine 10 mg once a day.  Hydrochlorothiazide previously on 25 mg once a day.  But this has been changed, and lisinopril 5 mg once a day.  Blood pressure is doing extremely well today at 110/74 and patient has had about a 14 pound weight loss since last seen.  I am concerned about the possibility of prerenal azotemia and mucous membranes are dry today and I suggest that we will have her hydrochlorothiazide dosage to 12.5 mg a day and we will recheck in 4 months.  4. Mixed hyperlipidemia Chronic.  Controlled.  Stable.  Continue on simvastatin 20 mg once a day.  And fenofibrate 145 mg once a day.  We will check a lipid panel on December 20 when she returns in fasting.  5. Polypharmacy As noted polypharmacy is a concern particularly with her psychotropic meds.  I am concerned about sedation but from my standpoint I will stop the Flexeril I do not see the advantage of this and see more of a negative  concerned than a positive return with this.  I do have concerns with her driving of told her I do not think she should be driving and that she should not be living by herself at this time until we have a stabilization of her psychotropic medications.  I spent 60 minutes with this patient, More than 50% of that time was spent in face to face education, counseling and care coordination.

## 2020-04-21 ENCOUNTER — Telehealth: Payer: Self-pay | Admitting: Family Medicine

## 2020-04-21 NOTE — Telephone Encounter (Signed)
Left message for patient to call back and schedule Medicare Annual Wellness Visit (AWV) either virtually or in office. Whichever the patients preference is.  No history of AWV; please schedule at anytime with [facility] Health Advisor.  This should be a 40 minute visit  AWV-I PER [place] AS OF 05/10/2009

## 2020-04-28 ENCOUNTER — Other Ambulatory Visit: Payer: Self-pay

## 2020-04-28 ENCOUNTER — Other Ambulatory Visit: Payer: Medicare Other

## 2020-04-28 DIAGNOSIS — I10 Essential (primary) hypertension: Secondary | ICD-10-CM

## 2020-04-28 DIAGNOSIS — E1159 Type 2 diabetes mellitus with other circulatory complications: Secondary | ICD-10-CM | POA: Diagnosis not present

## 2020-04-28 DIAGNOSIS — E034 Atrophy of thyroid (acquired): Secondary | ICD-10-CM

## 2020-04-28 DIAGNOSIS — E782 Mixed hyperlipidemia: Secondary | ICD-10-CM | POA: Diagnosis not present

## 2020-04-28 DIAGNOSIS — Z794 Long term (current) use of insulin: Secondary | ICD-10-CM | POA: Diagnosis not present

## 2020-04-29 ENCOUNTER — Other Ambulatory Visit: Payer: Self-pay

## 2020-04-29 ENCOUNTER — Telehealth: Payer: Self-pay

## 2020-04-29 ENCOUNTER — Encounter: Payer: Self-pay | Admitting: Family Medicine

## 2020-04-29 LAB — RENAL FUNCTION PANEL
Albumin: 4.7 g/dL (ref 3.7–4.7)
BUN/Creatinine Ratio: 18 (ref 12–28)
BUN: 18 mg/dL (ref 8–27)
CO2: 22 mmol/L (ref 20–29)
Calcium: 10.2 mg/dL (ref 8.7–10.3)
Chloride: 100 mmol/L (ref 96–106)
Creatinine, Ser: 1.01 mg/dL — ABNORMAL HIGH (ref 0.57–1.00)
GFR calc Af Amer: 64 mL/min/{1.73_m2} (ref >59)
GFR calc non Af Amer: 56 mL/min/{1.73_m2} — ABNORMAL LOW (ref >59)
Glucose: 88 mg/dL (ref 65–99)
Phosphorus: 4.7 mg/dL — ABNORMAL HIGH (ref 3.0–4.3)
Potassium: 4.7 mmol/L (ref 3.5–5.2)
Sodium: 139 mmol/L (ref 134–144)

## 2020-04-29 LAB — HEMOGLOBIN A1C
Est. average glucose Bld gHb Est-mCnc: 120 mg/dL
Hgb A1c MFr Bld: 5.8 % — ABNORMAL HIGH (ref 4.8–5.6)

## 2020-04-29 LAB — THYROID PANEL WITH TSH
Free Thyroxine Index: 2.6 (ref 1.2–4.9)
T3 Uptake Ratio: 27 % (ref 24–39)
T4, Total: 9.7 ug/dL (ref 4.5–12.0)
TSH: 0.599 u[IU]/mL (ref 0.450–4.500)

## 2020-04-29 LAB — LIPID PANEL WITH LDL/HDL RATIO
Cholesterol, Total: 182 mg/dL (ref 100–199)
HDL: 43 mg/dL (ref >39)
LDL Chol Calc (NIH): 98 mg/dL (ref 0–99)
LDL/HDL Ratio: 2.3 ratio (ref 0.0–3.2)
Triglycerides: 241 mg/dL — ABNORMAL HIGH (ref 0–149)
VLDL Cholesterol Cal: 41 mg/dL — ABNORMAL HIGH (ref 5–40)

## 2020-04-29 NOTE — Telephone Encounter (Signed)
Spoke to [employer]- no changes right now- will let us know if the med list she receives on her mom shows any changes

## 2020-04-29 NOTE — Telephone Encounter (Unsigned)
Copied from [place]. Topic: Quick Communication - See Telephone Encounter >> Apr 29, 2020  2:37 PM Loma Boston wrote: CRM for notification. See Telephone encounter for: 04/29/20.Pt Has called and states Dr Lenna Sciara changed her meds and she can't remember what and how much and is requesting that Baxter Flattery call her daughter one day this week as daughter is off and will be able to take the call. Daughter wants to understand med changes as she gives her mom her meds. [name], on DPR call this week at [phone].

## 2020-04-30 ENCOUNTER — Encounter: Payer: Self-pay | Admitting: Gastroenterology

## 2020-04-30 ENCOUNTER — Other Ambulatory Visit: Payer: Self-pay

## 2020-04-30 ENCOUNTER — Ambulatory Visit (INDEPENDENT_AMBULATORY_CARE_PROVIDER_SITE_OTHER): Payer: Medicare Other | Admitting: Gastroenterology

## 2020-04-30 VITALS — BP 129/61 | HR 64 | Temp 97.5°F | Ht 64.0 in | Wt 217.4 lb

## 2020-04-30 DIAGNOSIS — R1319 Other dysphagia: Secondary | ICD-10-CM | POA: Diagnosis not present

## 2020-04-30 NOTE — Patient Instructions (Signed)
Please go to the [facility] on 05/19/2020 at 12:30 PM. You are able to eat a regular breakfast and a light lunch. If this date and time does not work for you, please call [phone].  Merry Christmas!

## 2020-05-05 NOTE — Progress Notes (Signed)
Angela Fernandez [address]  [place]  [place], [place] [address]  Main: [phone]  Fax: [phone]   Gastroenterology Consultation  Referring Provider:     Juline Patch, MD Primary Care Physician:  Juline Patch, MD Reason for Consultation:            HPI:    Chief Complaint  Patient presents with  . pancreatic ductal abnormality    Angela Fernandez is a 74 y.o. y/o female referred for consultation & management  by Dr. Juline Patch, MD.    September 2021 colonoscopy with 3 mm rectal polyp removed.  Diverticulosis and internal hemorrhoids reported.  Pathology shows hyperplastic polyp.  Upper endoscopy in September 2021 which was done for indications "surveillance for malignancy due to personal history of Barrett's esophagus, follow-up of polyps in the duodenum" and reports short segment Barrett's, small post mucosectomy scar in the ampulla that appeared healthy, no evidence of previous polyp.  Repeat upper endoscopy recommended in 3 years for surveillance.  There is also a pathology report from the distal esophagus from September 2021 that shows intestinal metaplasia.  Patient reports dysphagia to solids and liquids.  No weight loss.  No nausea or vomiting.  No blood in stool.  Past Medical History:  Diagnosis Date  . Anemia   . Bipolar affect, depressed ([place])   . COPD (chronic obstructive pulmonary disease) ([place])   . Diabetes mellitus without complication ([place])   . GERD (gastroesophageal reflux disease)   . Hyperlipidemia   . Hypertension   . PTSD (post-traumatic stress disorder)   . Schizoaffective disorder ([place])   . Thyroid disease     Past Surgical History:  Procedure Laterality Date  . BREAST SURGERY     lumpectomy  . CHOLECYSTECTOMY    . SHOULDER SURGERY      Prior to Admission medications   Medication Sig Start Date End Date Taking? Authorizing Provider  acetaminophen (TYLENOL) 325 MG tablet Take 650 mg by mouth every 4 (four) hours as  needed.   Yes [provider]  albuterol (VENTOLIN HFA) 108 (90 Base) MCG/ACT inhaler Inhale 2 puffs into the lungs every 6 (six) hours as needed for wheezing or shortness of breath. 02/19/20  Yes Juline Patch, MD  amLODipine (NORVASC) 10 MG tablet Take 1 tablet (10 mg total) by mouth daily. Take one-half tablet by mouth in the morning /please give the dose is only 1/2 tablet a day and disregard 1 tablet a day. 02/19/20  Yes Juline Patch, MD  buPROPion (WELLBUTRIN SR) 200 MG 12 hr tablet Take 1 tablet (200 mg total) by mouth 2 (two) times daily. Take 1 bid   Yes [provider]  clonazePAM (KLONOPIN) 1 MG tablet Take 3.5 mg by mouth daily. Take 2 tabs in the am, 1.5 tabs at noon, and 1 tab at bedtime/ psych   Yes [provider]  desvenlafaxine (PRISTIQ) 50 MG 24 hr tablet Take 50 mg by mouth daily.   Yes [provider]  docusate sodium (COLACE) 100 MG capsule Take 200 mg by mouth 2 (two) times daily.   Yes [provider]  Dulaglutide (TRULICITY) 3.87 FI/4.3PI SOPN Inject 0.75 mg into the skin once a week. 04/18/20  Yes Juline Patch, MD  fenofibrate (TRICOR) 145 MG tablet Take 1 tablet (145 mg total) by mouth daily. 02/19/20  Yes Juline Patch, MD  ferrous sulfate 325 (65 FE) MG tablet Take 1 tablet (325 mg total)  by mouth daily with breakfast. 02/19/20  Yes Juline Patch, MD  Fluticasone-Umeclidin-Vilant (TRELEGY ELLIPTA) 100-62.5-25 MCG/INH AEPB Inhale 1 puff into the lungs daily. 02/25/20  Yes Juline Patch, MD  Glucagon, rDNA, (GLUCAGON EMERGENCY) 1 MG KIT Inject 1 Device as directed as needed (hypoglycemia). 04/18/20  Yes Juline Patch, MD  hydrochlorothiazide (HYDRODIURIL) 25 MG tablet Take 1 tablet (25 mg total) by mouth daily. 02/19/20  Yes Juline Patch, MD  Insulin Aspart (NOVOLOG FLEXPEN [place]) Inject 15 Units into the skin daily. After biggest meal of the day   Yes [provider]  insulin glargine (LANTUS SOLOSTAR) 100  UNIT/ML Solostar Pen Inject 30 Units into the skin daily. 30 units daily into skin 04/18/20  Yes Juline Patch, MD  lactulose [facility]) 10 GM/15ML solution Take by mouth. 02/19/20  Yes [provider]  levothyroxine (SYNTHROID) 175 MCG tablet Take 1 tablet (175 mcg total) by mouth daily before breakfast. 02/19/20  Yes Juline Patch, MD  lisinopril (ZESTRIL) 5 MG tablet Take 1 tablet (5 mg total) by mouth daily. 02/19/20  Yes Juline Patch, MD  Lurasidone HCl (LATUDA) 120 MG TABS Take 1 tablet by mouth daily. psych   Yes [provider]  meloxicam (MOBIC) 7.5 MG tablet Take 1 tablet (7.5 mg total) by mouth daily. 02/19/20  Yes Juline Patch, MD  metoprolol succinate (TOPROL-XL) 100 MG 24 hr tablet Take 1 tablet (100 mg total) by mouth daily. Take with or immediately following a meal. 02/19/20  Yes Juline Patch, MD  Multiple Vitamins-Iron (MULTIVITAMINS WITH IRON) TABS tablet Take 1 tablet by mouth daily.   Yes [provider]  Multiple Vitamins-Minerals (HAIR SKIN AND NAILS FORMULA PO) Take 1 tablet by mouth daily.   Yes [provider]  omeprazole (PRILOSEC) 40 MG capsule Take 1 capsule (40 mg total) by mouth daily. 02/19/20  Yes Juline Patch, MD  simvastatin (ZOCOR) 20 MG tablet Take 1 tablet (20 mg total) by mouth daily. 02/19/20  Yes Juline Patch, MD  sulfamethoxazole-trimethoprim (BACTRIM DS) 800-160 MG tablet Take 1 tablet by mouth 2 (two) times daily. 04/12/20  Yes [provider]  traZODone (DESYREL) 50 MG tablet Take 50 mg by mouth at bedtime. Take one tablet at bedtime/ psych   Yes [provider]  zolpidem (AMBIEN) 10 MG tablet Take 10 mg by mouth at bedtime as needed. 02/21/20  Yes [provider]    History reviewed. No pertinent family history.   Social History   Tobacco Use  . Smoking status: Former Smoker    Years: 54.00    Types: Cigarettes    Quit date: 05/10/2016    Years since quitting: 3.9  .  Smokeless tobacco: Never Used  Substance Use Topics  . Alcohol use: Not Currently  . Drug use: Never    Allergies as of 04/30/2020  . (No Known Allergies)    Review of Systems:    All systems reviewed and negative except where noted in HPI.   Physical Exam:  BP 129/61   Pulse 64   Temp (!) 97.5 F (36.4 C) (Oral)   Ht 5' 4" (1.626 m)   Wt 217 lb 6.4 oz (98.6 kg)   BMI 37.32 kg/m  No LMP recorded. Psych:  Alert and cooperative. Normal mood and affect. General:   Alert,  Well-developed, well-nourished, pleasant and cooperative in NAD Head:  Normocephalic and atraumatic. Eyes:  Sclera clear, no icterus.   Conjunctiva pink.  Ears:  Normal auditory acuity. Nose:  No deformity, discharge, or lesions. Mouth:  No deformity or lesions,oropharynx pink & moist. Neck:  Supple; no masses or thyromegaly. Abdomen:  Normal bowel sounds.  No bruits.  Soft, non-tender and non-distended without masses, hepatosplenomegaly or hernias noted.  No guarding or rebound tenderness.    Msk:  Symmetrical without gross deformities. Good, equal movement & strength bilaterally. Pulses:  Normal pulses noted. Extremities:  No clubbing or edema.  No cyanosis. Neurologic:  Alert and oriented x3;  grossly normal neurologically. Skin:  Intact without significant lesions or rashes. No jaundice. Lymph Nodes:  No significant cervical adenopathy. Psych:  Alert and cooperative. Normal mood and affect.   Labs: CBC    Component Value Date/Time   WBC 15.5 12/28/2019 0000   HGB 13.2 12/28/2019 0000   HCT 41 12/28/2019 0000   PLT 421 (A) 12/28/2019 0000   CMP     Component Value Date/Time   NA 139 04/28/2020 1100   K 4.7 04/28/2020 1100   CL 100 04/28/2020 1100   CO2 22 04/28/2020 1100   GLUCOSE 88 04/28/2020 1100   BUN 18 04/28/2020 1100   CREATININE 1.01 (H) 04/28/2020 1100   CALCIUM 10.2 04/28/2020 1100   ALBUMIN 4.7 04/28/2020 1100   AST 31 12/28/2019 0000   ALT 41 (A) 12/28/2019 0000   ALKPHOS 29  12/28/2019 0000   GFRNONAA 56 (L) 04/28/2020 1100   GFRAA 64 04/28/2020 1100    Imaging Studies: No results found.  Assessment and Plan:   Angela Fernandez is a 74 y.o. y/o female has been referred for "pancreatic ductal abnormality"  I do not have any office reports from her previous gastroenterologist.  Patient is moving here locally and reestablishing GI care here  I will need to obtain her previous GI office notes to decipher if there is in fact a "pancreatic ductal abnormality" mentioned in their office notes.  I suspect that this may be referring to her previously known duodenal polyps that have been removed by her previous GIs.  Given her very recent endoscopies this year, she is not in need of another endoscopy  we will obtain swallow study for her dysphagia as her upper endoscopy was normal  We will await records from her previous gastroenterologist  Dr Angela Fernandez  Speech recognition software was used to dictate the above note.

## 2020-05-13 ENCOUNTER — Encounter: Payer: Self-pay | Admitting: Gastroenterology

## 2020-05-13 NOTE — Progress Notes (Unsigned)
Previous records obtained, office note from May 2021 by [facility] gastroenterology consultants.  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  Patient did undergo surveillance with EGD/ERCP  Per patient's latest endoscopy report from September 2021 surveillance is recommended in 3 years  There is no mention of any other pancreatic duct abnormality on the GI notes  Patient to follow-up in clinic in the next few months to discuss any ongoing reflux issues given her history of Barrett's and discussed possibly lowering her Prilosec dose to lowest dose possible  Can also discuss with patient if they would like to continue follow-up with me or switch to gastroenterologist in [place] when it is time for her surveillance EGD/ERCP in 3 years

## 2020-05-19 ENCOUNTER — Ambulatory Visit
Admission: RE | Admit: 2020-05-19 | Discharge: 2020-05-19 | Disposition: A | Discharge status: Home / Self Care | Payer: Medicare Other | Source: Ambulatory Visit | Attending: Gastroenterology | Admitting: Gastroenterology

## 2020-05-19 ENCOUNTER — Other Ambulatory Visit: Payer: Self-pay

## 2020-05-19 DIAGNOSIS — R1319 Other dysphagia: Secondary | ICD-10-CM | POA: Diagnosis not present

## 2020-05-19 DIAGNOSIS — R1310 Dysphagia, unspecified: Secondary | ICD-10-CM | POA: Diagnosis not present

## 2020-05-19 IMAGING — RF DG SWALLOWING FUNCTION
1 series · 1 of 1 positions shown · non-contrast
Comparison: None.

CLINICAL DATA: Dysphagia.

EXAM:
MODIFIED BARIUM SWALLOW
TECHNIQUE: Different consistencies of barium were administered orally to the
patient by the Speech Pathologist. Imaging of the pharynx was
performed in the lateral projection. The radiologist was present in
the fluoroscopy room for this study, providing personal supervision.
FLUOROSCOPY TIME:  Fluoroscopy Time:  48 seconds
Radiation Exposure Index (if provided by the fluoroscopic device):
3.9 mGy
Number of Acquired Spot Images: 0

[Series 1: cp_standard · 0.25mm/px · 1 of 1 slices shown]
[im 1/1]
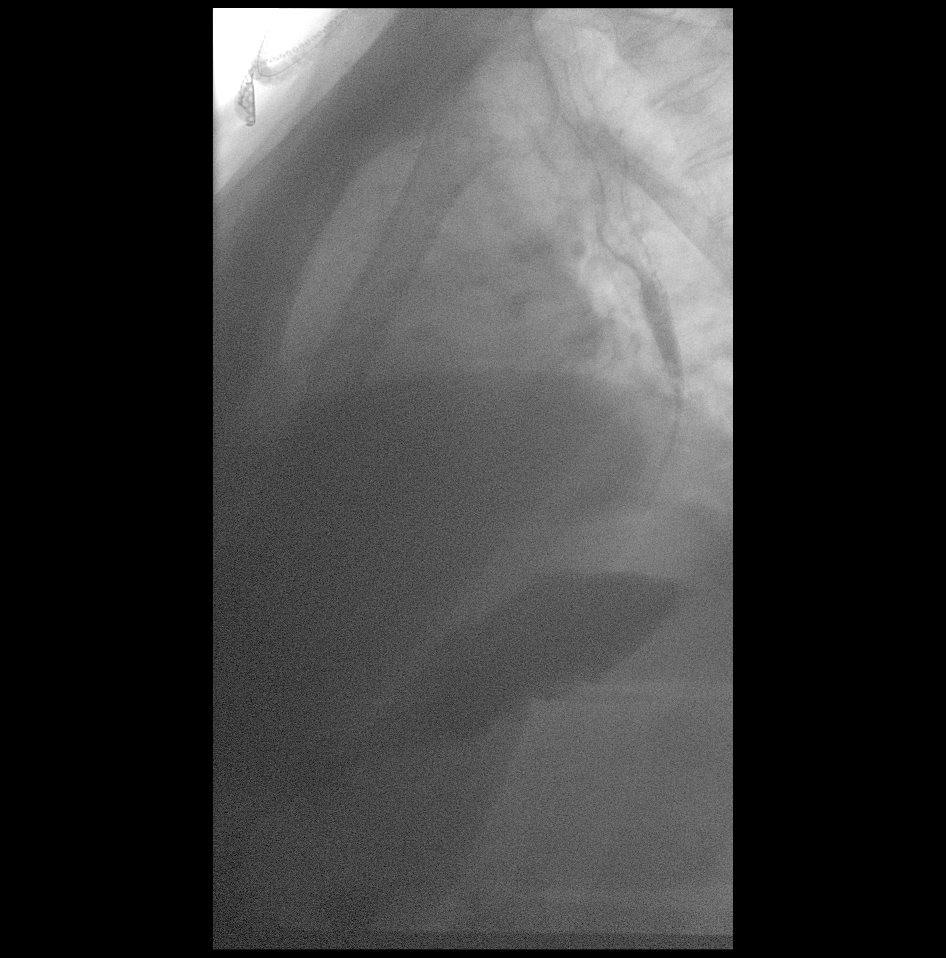

[1 of 1 positions shown; findings below may reference images not displayed]

FINDINGS: Real-time fluoroscopy of the swallowing function was performed with
a speech pathologist present.

Multiple consistencies of barium were administered which included
water, nectar, applesauce, STEFANO cracker, and barium tablet.

Normal swallow function. No laryngeal penetration or tracheal
aspiration.
IMPRESSION: Modified barium swallow as described above.

Please refer to the Speech Pathologists report for complete details
and recommendations.

## 2020-05-19 NOTE — Therapy (Signed)
[place]ela [place], [place], [address] Phone: [phone]   Fax:     Modified Barium Swallow  Patient Details  Name: Angela Fernandez MRN: [id number] Date of Birth: [birth year] No data recorded  Encounter Date: 05/19/2020   End of Session - 05/19/20 1349    Visit Number 1    Number of Visits 1    Date for SLP Re-Evaluation 05/19/20    SLP Start Time 3532    SLP Stop Time  1330    SLP Time Calculation (min) 25 min    Activity Tolerance Patient tolerated treatment well              Subjective Assessment - 05/19/20 1338    Subjective "When I eat bread or something dry, it gets caught in my throat and I cough."    Currently in Pain? No/denies              Objective Swallowing Evaluation: Type of Study: MBS-Modified Barium Swallow Study   Patient Details  Name: Angela Fernandez MRN: [id number] Date of Birth: 02/28/1946  Today's Date: 05/19/2020 Time: No data recorded-No data recorded No data recorded  Past Medical History:  Past Medical History:  Diagnosis Date  . Anemia   . Bipolar affect, depressed ([place])   . COPD (chronic obstructive pulmonary disease) ([place])   . Diabetes mellitus without complication ([place])   . GERD (gastroesophageal reflux disease)   . Hyperlipidemia   . Hypertension   . PTSD (post-traumatic stress disorder)   . Schizoaffective disorder ([place])   . Thyroid disease    Past Surgical History:  Past Surgical History:  Procedure Laterality Date  . BREAST SURGERY     lumpectomy  . CHOLECYSTECTOMY    . SHOULDER SURGERY     HPI: Angela Fernandez is a 75 y.o. female referred by her GI, Vonda Antigua, MD for Modified Barium Swallow due to reports of dysphagia. Past medical history is noted for bipolar, COPD, DM, GERD, HLD, HTN, PTSD, Schizoaffective disorder, Thyroid disease. Patient recently relocated to the area from [place]. She reports her "throat was stretched,"  several years ago; no records of any past esophageal dilation are available in EPIC. Her chief complaint is feeling that bread and dry foods become "stuck" about the area of her thyroid notch, causing her to cough.   Subjective: cooperative, pleasant    Assessment / Plan / Recommendation  CHL IP CLINICAL IMPRESSIONS 05/19/2020  Clinical Impression Patient presents with grossly functional oropharyngeal swallowing. Oral phase is characterized by adequate oral control, bolus formation, and  nterior to posterior transfer. Pharyngeal swallow initiation is WFL at the level of the valleculae. Pharyngeal stage is characterized by adequate tongue base retraction, hyolaryngeal excursion, and pharyngeal constriction. Epiglottic deflection and airway protection are complete; there is no observed laryngeal penetration or tracheal aspiration. There is no abnormal residue remaining after the swallow. Amplitude and duration of cricopharyngeus opening appear within normal limits. View of pt's cervical esophagus was limited due to shoulder positioning. GI may wish to consider further assessment of motility. Recommend regular diet and thin liquids, meds whole with liquid. Discussed with patient after the study some behavioral modifications that may reduce symptoms, such as eating smaller, more frequent meals, alternating solids with liquids, and remaining upright after eating and drinking.  SLP Visit Diagnosis Dysphagia, pharyngoesophageal phase (R13.14)  Attention and concentration deficit following --  Frontal lobe and executive function deficit following --  Impact on safety  and function Mild aspiration risk      CHL IP TREATMENT RECOMMENDATION 05/19/2020  Treatment Recommendations No treatment recommended at this time     Prognosis 05/19/2020  Prognosis for Safe Diet Advancement Good  Barriers to Reach Goals --  Barriers/Prognosis Comment --    CHL IP DIET RECOMMENDATION 05/19/2020  SLP Diet Recommendations  Regular solids;Thin liquid  Liquid Administration via Cup  Medication Administration Whole meds with liquid  Compensations Slow rate;Small sips/bites;Follow solids with liquid  Postural Changes Seated upright at 90 degrees;Remain semi-upright after after feeds/meals (Comment)      CHL IP OTHER RECOMMENDATIONS 05/19/2020  Recommended Consults Other (Comment)  Oral Care Recommendations Oral care BID  Other Recommendations --      CHL IP FOLLOW UP RECOMMENDATIONS 05/19/2020  Follow up Recommendations None      No flowsheet data found.         CHL IP ORAL PHASE 05/19/2020  Oral Phase WFL  Oral - Pudding Teaspoon --  Oral - Pudding Cup --  Oral - Honey Teaspoon --  Oral - Honey Cup --  Oral - Nectar Teaspoon --  Oral - Nectar Cup --  Oral - Nectar Straw --  Oral - Thin Teaspoon --  Oral - Thin Cup --  Oral - Thin Straw --  Oral - Puree --  Oral - Mech Soft --  Oral - Regular --  Oral - Multi-Consistency --  Oral - Pill --  Oral Phase - Comment --    CHL IP PHARYNGEAL PHASE 05/19/2020  Pharyngeal Phase WFL  Pharyngeal- Pudding Teaspoon --  Pharyngeal --  Pharyngeal- Pudding Cup --  Pharyngeal --  Pharyngeal- Honey Teaspoon --  Pharyngeal --  Pharyngeal- Honey Cup --  Pharyngeal --  Pharyngeal- Nectar Teaspoon --  Pharyngeal --  Pharyngeal- Nectar Cup [facility]  Pharyngeal Material does not enter airway  Pharyngeal- Nectar Straw --  Pharyngeal --  Pharyngeal- Thin Teaspoon --  Pharyngeal --  Pharyngeal- Thin Cup [facility]  Pharyngeal Material does not enter airway  Pharyngeal- Thin Straw --  Pharyngeal --  Pharyngeal- Puree WFL  Pharyngeal Material does not enter airway  Pharyngeal- Mechanical Soft WFL  Pharyngeal Material does not enter airway  Pharyngeal- Regular --  Pharyngeal --  Pharyngeal- Multi-consistency --  Pharyngeal --  Pharyngeal- Pill WFL  Pharyngeal Material does not enter airway  Pharyngeal Comment --     CHL IP CERVICAL ESOPHAGEAL PHASE 05/19/2020   Cervical Esophageal Phase WFL  Pudding Teaspoon --  Pudding Cup --  Honey Teaspoon --  Honey Cup --  Nectar Teaspoon --  Nectar Cup --  Nectar Straw --  Thin Teaspoon --  Thin Cup --  Thin Straw --  Puree --  Mechanical Soft --  Regular --  Multi-consistency --  Pill --  Cervical Esophageal Comment --     Deneise Lever, MS, CCC-SLP Speech-Language Pathologist  Aliene Altes 05/19/2020, 2:17 PM                                             Patient will benefit from skilled therapeutic intervention in order to improve the following deficits and impairments:   Other dysphagia - Plan: DG SWALLOW FUNC OP MEDICARE SPEECH PATH, DG SWALLOW FUNC OP MEDICARE SPEECH PATH        Problem List There are no problems to display for this  patient.   Aliene Altes 05/19/2020, 2:17 PM  [facility] [place], [place], [address] Phone: [phone]   Fax:     Name: Angela Fernandez MRN: [id number] Date of Birth: 1946-04-25

## 2020-05-20 ENCOUNTER — Telehealth: Payer: Self-pay

## 2020-05-20 NOTE — Telephone Encounter (Signed)
-----   Message from Virgel Manifold, MD sent at 05/20/2020 12:45 PM EST ----- Angela Fernandez please let the patient know, her swallow study did not show any oropharyngeal dysphagia issues.  I would recommend follow-up in clinic either in person or virtual in 3 to 4 weeks to discuss next steps.

## 2020-05-20 NOTE — Telephone Encounter (Signed)
Called patient to let her know the below information. Patient agreed on a follow up appointment with Dr. Bonna Gains on 06/16/2020 at 10:40 AM. Patient had no further questions.

## 2020-05-23 DIAGNOSIS — E063 Autoimmune thyroiditis: Secondary | ICD-10-CM | POA: Diagnosis not present

## 2020-05-23 DIAGNOSIS — E782 Mixed hyperlipidemia: Secondary | ICD-10-CM | POA: Diagnosis not present

## 2020-05-23 DIAGNOSIS — I10 Essential (primary) hypertension: Secondary | ICD-10-CM | POA: Diagnosis not present

## 2020-05-23 DIAGNOSIS — E119 Type 2 diabetes mellitus without complications: Secondary | ICD-10-CM | POA: Diagnosis not present

## 2020-05-27 ENCOUNTER — Other Ambulatory Visit: Payer: Self-pay | Admitting: Family Medicine

## 2020-05-27 DIAGNOSIS — E1159 Type 2 diabetes mellitus with other circulatory complications: Secondary | ICD-10-CM

## 2020-05-27 DIAGNOSIS — Z794 Long term (current) use of insulin: Secondary | ICD-10-CM

## 2020-06-02 ENCOUNTER — Other Ambulatory Visit: Payer: Self-pay

## 2020-06-09 ENCOUNTER — Other Ambulatory Visit: Payer: Self-pay | Admitting: Family Medicine

## 2020-06-09 DIAGNOSIS — E1159 Type 2 diabetes mellitus with other circulatory complications: Secondary | ICD-10-CM

## 2020-06-16 ENCOUNTER — Other Ambulatory Visit: Payer: Self-pay

## 2020-06-16 ENCOUNTER — Encounter: Payer: Self-pay | Admitting: Gastroenterology

## 2020-06-16 ENCOUNTER — Telehealth (INDEPENDENT_AMBULATORY_CARE_PROVIDER_SITE_OTHER): Payer: Medicare Other | Admitting: Gastroenterology

## 2020-06-16 DIAGNOSIS — R1319 Other dysphagia: Secondary | ICD-10-CM

## 2020-06-17 NOTE — Progress Notes (Signed)
Angela Antigua, MD [address]  [place]  [place], [place] [address]  Main: [phone]  Fax: [phone]   Primary Care Physician: Juline Patch, MD  Virtual Visit via Telephone Note  I connected with patient on 06/17/20 at 10:40 AM EST by telephone and verified that I am speaking with the correct person using two identifiers.   I discussed the limitations, risks, security and privacy concerns of performing an evaluation and management service by telephone and the availability of in person appointments. I also discussed with the patient that there may be a patient responsible charge related to this service. The patient expressed understanding and agreed to proceed.  Location of Patient: Home Location of Provider: Home Persons involved: Patient and provider only during the visit (nursing staff and front desk staff was involved in communicating with the patient prior to the appointment, reviewing medications and checking them in). Pt unable to connect via mychart video   History of Present Illness: Chief Complaint  Patient presents with  . Dysphagia    Patient stated that she had some piece of meat on her throat.     HPI: Angela Fernandez is a 75 y.o. female with history of Barrett's esophagus here for follow-up.  Patient is reporting dysphagia to solid foods.  Reports about 1 to 2 weeks ago she was eating meat and it got stuck in her mid chest, but eventually passed.  This has been occurring intermittently.  Her last upper endoscopy in September 2021 at outlying facility reports biopsies of the distal esophagus which showed gastric type mucosa with intestinal metaplasia.  It does not appear that biopsies were taken in the proximal and distal esophagus for EOE specifically.  Patient does report dilation with one of her previous upper endoscopies, and that endoscopy report is not available to [place].  Current Outpatient Medications  Medication Sig Dispense Refill  .  acetaminophen (TYLENOL) 325 MG tablet Take 650 mg by mouth every 4 (four) hours as needed.    Marland Kitchen albuterol (VENTOLIN HFA) 108 (90 Base) MCG/ACT inhaler Inhale 2 puffs into the lungs every 6 (six) hours as needed for wheezing or shortness of breath. 8 g 2  . amLODipine (NORVASC) 10 MG tablet Take 1 tablet (10 mg total) by mouth daily. Take one-half tablet by mouth in the morning /please give the dose is only 1/2 tablet a day and disregard 1 tablet a day. 45 tablet 0  . buPROPion (WELLBUTRIN SR) 200 MG 12 hr tablet Take 1 tablet (200 mg total) by mouth 2 (two) times daily. Take 1 bid    . clonazePAM (KLONOPIN) 1 MG tablet Take 3.5 mg by mouth daily. Take 2 tabs in the am, 1.5 tabs at noon, and 1 tab at bedtime/ psych    . desvenlafaxine (PRISTIQ) 50 MG 24 hr tablet Take 50 mg by mouth daily.    Marland Kitchen docusate sodium (COLACE) 100 MG capsule Take 200 mg by mouth 2 (two) times daily.    . fenofibrate (TRICOR) 145 MG tablet Take 1 tablet (145 mg total) by mouth daily. 90 tablet 0  . ferrous sulfate 325 (65 FE) MG tablet Take 1 tablet (325 mg total) by mouth daily with breakfast. 90 tablet 0  . Fluticasone-Umeclidin-Vilant (TRELEGY ELLIPTA) 100-62.5-25 MCG/INH AEPB Inhale 1 puff into the lungs daily. 1 each 0  . Glucagon, rDNA, (GLUCAGON EMERGENCY) 1 MG KIT Inject 1 Device as directed as needed (hypoglycemia). 1 kit 0  . hydrochlorothiazide (HYDRODIURIL) 25 MG tablet  Take 1 tablet (25 mg total) by mouth daily. 90 tablet 0  . Insulin Aspart (NOVOLOG FLEXPEN ) Inject 15 Units into the skin daily. After biggest meal of the day    . insulin glargine (LANTUS SOLOSTAR) 100 UNIT/ML Solostar Pen Inject 30 Units into the skin daily. 30 units daily into skin 15 mL 1  . lactulose (CHRONULAC) 10 GM/15ML solution Take by mouth.    . levothyroxine (SYNTHROID) 175 MCG tablet Take 1 tablet (175 mcg total) by mouth daily before breakfast. 90 tablet 0  . lisinopril (ZESTRIL) 5 MG tablet Take 1 tablet (5 mg total) by mouth  daily. 90 tablet 0  . Lurasidone HCl (LATUDA) 120 MG TABS Take 1 tablet by mouth daily. psych    . meloxicam (MOBIC) 7.5 MG tablet Take 1 tablet (7.5 mg total) by mouth daily. 90 tablet 0  . metoprolol succinate (TOPROL-XL) 100 MG 24 hr tablet Take 1 tablet (100 mg total) by mouth daily. Take with or immediately following a meal. 90 tablet 0  . Multiple Vitamins-Iron (MULTIVITAMINS WITH IRON) TABS tablet Take 1 tablet by mouth daily.    . Multiple Vitamins-Minerals (HAIR SKIN AND NAILS FORMULA PO) Take 1 tablet by mouth daily.    Marland Kitchen omeprazole (PRILOSEC) 40 MG capsule Take 1 capsule (40 mg total) by mouth daily. 90 capsule 0  . ondansetron (ZOFRAN) 4 MG tablet Take 1 tablet by mouth daily.    . simvastatin (ZOCOR) 20 MG tablet Take 1 tablet (20 mg total) by mouth daily. 90 tablet 0  . traZODone (DESYREL) 50 MG tablet Take 50 mg by mouth at bedtime. Take one tablet at bedtime/ psych    . TRULICITY 7.09 GG/8.3MO SOPN INJECT 1 SYRINGE SUBCUTANEOUSLY ONCE A WEEK 4 mL 0  . zolpidem (AMBIEN) 10 MG tablet Take 10 mg by mouth at bedtime as needed.    . sulfamethoxazole-trimethoprim (BACTRIM DS) 800-160 MG tablet Take 1 tablet by mouth 2 (two) times daily. (Patient not taking: Reported on 06/16/2020)    . traMADol (ULTRAM) 50 MG tablet Take 1 tablet by mouth 1 day or 1 dose. (Patient not taking: Reported on 06/16/2020)     No current facility-administered medications for this visit.    Allergies as of 06/16/2020  . (No Known Allergies)    Review of Systems:    All systems reviewed and negative except where noted in HPI.   Observations/Objective:  Labs: CMP     Component Value Date/Time   NA 139 04/28/2020 1100   K 4.7 04/28/2020 1100   CL 100 04/28/2020 1100   CO2 22 04/28/2020 1100   GLUCOSE 88 04/28/2020 1100   BUN 18 04/28/2020 1100   CREATININE 1.01 (H) 04/28/2020 1100   CALCIUM 10.2 04/28/2020 1100   ALBUMIN 4.7 04/28/2020 1100   AST 31 12/28/2019 0000   ALT 41 (A) 12/28/2019 0000    ALKPHOS 29 12/28/2019 0000   GFRNONAA 56 (L) 04/28/2020 1100   GFRAA 64 04/28/2020 1100   Lab Results  Component Value Date   WBC 15.5 12/28/2019   HGB 13.2 12/28/2019   HCT 41 12/28/2019   PLT 421 (A) 12/28/2019    Imaging Studies: DG SWALLOW FUNC OP MEDICARE SPEECH PATH  Result Date: 05/19/2020 CLINICAL DATA:  Dysphagia. EXAM: MODIFIED BARIUM SWALLOW TECHNIQUE: Different consistencies of barium were administered orally to the patient by the Speech Pathologist. Imaging of the pharynx was performed in the lateral projection. The radiologist was present in the fluoroscopy room for this  study, providing personal supervision. FLUOROSCOPY TIME:  Fluoroscopy Time:  48 seconds Radiation Exposure Index (if provided by the fluoroscopic device): 3.9 mGy Number of Acquired Spot Images: 0 COMPARISON:  None. FINDINGS: Real-time fluoroscopy of the swallowing function was performed with a speech pathologist present. Multiple consistencies of barium were administered which included water, nectar, applesauce, Graham cracker, and barium tablet. Normal swallow function. No laryngeal penetration or tracheal aspiration. IMPRESSION: Modified barium swallow as described above. Please refer to the Speech Pathologists report for complete details and recommendations. Electronically Signed   By: Kathreen Devoid   On: 05/19/2020 14:11 Objective Swallowing Evaluation: Type of Study: MBS-Modified Barium Swallow Study  Patient Details Name: Angela Fernandez MRN: [id number] Date of Birth: [birth year] Today's Date: 05/19/2020 Time: No data recorded-No data recorded No data recorded Past Medical History: Past Medical History: Diagnosis Date . Anemia  . Bipolar affect, depressed ([place])  . COPD (chronic obstructive pulmonary disease) ([place])  . Diabetes mellitus without complication ([place])  . GERD (gastroesophageal reflux disease)  . Hyperlipidemia  . Hypertension  . PTSD (post-traumatic stress disorder)  . Schizoaffective disorder ([place])  . Thyroid  disease  Past Surgical History: Past Surgical History: Procedure Laterality Date . BREAST SURGERY    lumpectomy . CHOLECYSTECTOMY   . SHOULDER SURGERY   HPI: Angela Fernandez is a 75 y.o. female referred by her GI, Angela Antigua, MD for Modified Barium Swallow due to reports of dysphagia. Past medical history is noted for bipolar, COPD, DM, GERD, HLD, HTN, PTSD, Schizoaffective disorder, Thyroid disease. Patient recently relocated to the area from [place]. She reports her "throat was stretched," several years ago; no records of any past esophageal dilation are available in EPIC. Her chief complaint is feeling that bread and dry foods become "stuck" about the area of her thyroid notch, causing her to cough.  Subjective: cooperative, pleasant Assessment / Plan / Recommendation CHL IP CLINICAL IMPRESSIONS 05/19/2020 Clinical Impression Patient presents with grossly functional oropharyngeal swallowing. Oral phase is characterized by adequate oral control, bolus formation, and  nterior to posterior transfer. Pharyngeal swallow initiation is WFL at the level of the valleculae. Pharyngeal stage is characterized by adequate tongue base retraction, hyolaryngeal excursion, and pharyngeal constriction. Epiglottic deflection and airway protection are complete; there is no observed laryngeal penetration or tracheal aspiration. There is no abnormal residue remaining after the swallow. Amplitude and duration of cricopharyngeus opening appear within normal limits. View of pt's cervical esophagus was limited due to shoulder positioning. GI may wish to consider further assessment of motility. Recommend regular diet and thin liquids, meds whole with liquid. Discussed with patient after the study some behavioral modifications that may reduce symptoms, such as eating smaller, more frequent meals, alternating solids with liquids, and remaining upright after eating and drinking. SLP Visit Diagnosis Dysphagia, pharyngoesophageal  phase (R13.14) Attention and concentration deficit following -- Frontal lobe and executive function deficit following -- Impact on safety and function Mild aspiration risk   CHL IP TREATMENT RECOMMENDATION 05/19/2020 Treatment Recommendations No treatment recommended at this time   Prognosis 05/19/2020 Prognosis for Safe Diet Advancement Good Barriers to Reach Goals -- Barriers/Prognosis Comment -- CHL IP DIET RECOMMENDATION 05/19/2020 SLP Diet Recommendations Regular solids;Thin liquid Liquid Administration via Cup Medication Administration Whole meds with liquid Compensations Slow rate;Small sips/bites;Follow solids with liquid Postural Changes Seated upright at 90 degrees;Remain semi-upright after after feeds/meals (Comment)   CHL IP OTHER RECOMMENDATIONS 05/19/2020 Recommended Consults Other (Comment) Oral Care Recommendations Oral care BID Other Recommendations --  CHL IP FOLLOW UP RECOMMENDATIONS 05/19/2020 Follow up Recommendations None   No flowsheet data found.     CHL IP ORAL PHASE 05/19/2020 Oral Phase WFL Oral - Pudding Teaspoon -- Oral - Pudding Cup -- Oral - Honey Teaspoon -- Oral - Honey Cup -- Oral - Nectar Teaspoon -- Oral - Nectar Cup -- Oral - Nectar Straw -- Oral - Thin Teaspoon -- Oral - Thin Cup -- Oral - Thin Straw -- Oral - Puree -- Oral - Mech Soft -- Oral - Regular -- Oral - Multi-Consistency -- Oral - Pill -- Oral Phase - Comment --  CHL IP PHARYNGEAL PHASE 05/19/2020 Pharyngeal Phase WFL Pharyngeal- Pudding Teaspoon -- Pharyngeal -- Pharyngeal- Pudding Cup -- Pharyngeal -- Pharyngeal- Honey Teaspoon -- Pharyngeal -- Pharyngeal- Honey Cup -- Pharyngeal -- Pharyngeal- Nectar Teaspoon -- Pharyngeal -- Pharyngeal- Nectar Cup [facility] Pharyngeal Material does not enter airway Pharyngeal- Nectar Straw -- Pharyngeal -- Pharyngeal- Thin Teaspoon -- Pharyngeal -- Pharyngeal- Thin Cup [facility] Pharyngeal Material does not enter airway Pharyngeal- Thin Straw -- Pharyngeal -- Pharyngeal- Puree WFL Pharyngeal  Material does not enter airway Pharyngeal- Mechanical Soft WFL Pharyngeal Material does not enter airway Pharyngeal- Regular -- Pharyngeal -- Pharyngeal- Multi-consistency -- Pharyngeal -- Pharyngeal- Pill WFL Pharyngeal Material does not enter airway Pharyngeal Comment --  CHL IP CERVICAL ESOPHAGEAL PHASE 05/19/2020 Cervical Esophageal Phase WFL Pudding Teaspoon -- Pudding Cup -- Honey Teaspoon -- Honey Cup -- Nectar Teaspoon -- Nectar Cup -- Nectar Straw -- Thin Teaspoon -- Thin Cup -- Thin Straw -- Puree -- Mechanical Soft -- Regular -- Multi-consistency -- Pill -- Cervical Esophageal Comment -- Deneise Lever, MS, CCC-SLP Speech-Language Pathologist Aliene Altes 05/19/2020, 2:20 PM               Assessment and Plan:   Angela Fernandez is a 75 y.o. y/o female here for follow-up of Barrett's esophagus and now reporting dysphagia to solid foods  Assessment and Plan: Patient states she chews her food well and even when it is chewed well, she is still having intermittent dysphagia to solid foods.  We discussed options of upper endoscopy for evaluation of Schatzki's ring, and possible dilation if necessary, distal and proximal biopsies for EOE  Alternative options of conservative management were discussed in detail, including but not limited to medication management, foregoing endoscopic procedures at this time and others.    I have discussed alternative options, risks & benefits,  which include, but are not limited to, bleeding, infection, perforation,respiratory complication & drug reaction.  The patient agrees with this plan & written consent will be obtained.    In 3 years from September 2021, patient will be due for surveillance of the ampulla for history of ampullary duodenal polyp resection.  This will need a side-viewing scope.  Therefore, in September 2024, we will need to set up patient's EGD for surveillance with an endoscopist that is able to use a side-viewing scope.  One of of our [place] providers is able to do this, and can set up the patient with them at that time  Follow Up Instructions:    I discussed the assessment and treatment plan with the patient. The patient was provided an opportunity to ask questions and all were answered. The patient agreed with the plan and demonstrated an understanding of the instructions.   The patient was advised to call back or seek an in-person evaluation if the symptoms worsen or if the condition fails to improve as anticipated.  I provided 15 minutes of non-face-to-face time during this encounter. Additional time was spent in reviewing patient's chart, placing orders etc.   Virgel Manifold, MD  Speech recognition software was used to dictate this note.

## 2020-07-06 ENCOUNTER — Other Ambulatory Visit: Payer: Self-pay | Admitting: Family Medicine

## 2020-07-06 DIAGNOSIS — E1159 Type 2 diabetes mellitus with other circulatory complications: Secondary | ICD-10-CM

## 2020-07-06 DIAGNOSIS — Z794 Long term (current) use of insulin: Secondary | ICD-10-CM

## 2020-07-06 NOTE — Telephone Encounter (Signed)
Requested Prescriptions  Pending Prescriptions Disp Refills  . TRULICITY 2.95 MW/4.1LK SOPN [Pharmacy Med Name: Trulicity 4.40 NU/2.7OZ Subcutaneous Solution Pen-injector] 4 mL 0    Sig: INJECT ONE [place] WEEKLY     Endocrinology:  Diabetes - GLP-1 Receptor Agonists Passed - 07/06/2020  5:15 PM      Passed - HBA1C is between 0 and 7.9 and within 180 days    Hemoglobin A1C  Date Value Ref Range Status  12/28/2019 6.8  Final   Hgb A1c MFr Bld  Date Value Ref Range Status  04/28/2020 5.8 (H) 4.8 - 5.6 % Final    Comment:             Prediabetes: 5.7 - 6.4          Diabetes: >6.4          Glycemic control for adults with diabetes: <7.0          Passed - Valid encounter within last 6 months    Recent Outpatient Visits          2 months ago Type 2 diabetes mellitus with other circulatory complication, with long-term current use of insulin ([place])   [facility] Juline Patch, MD   4 months ago Establishing care with new doctor, encounter for   [facility] Juline Patch, MD      Future Appointments            In 1 month Juline Patch, MD [facility], [facility]

## 2020-07-13 ENCOUNTER — Other Ambulatory Visit: Payer: Self-pay | Admitting: Family Medicine

## 2020-07-13 DIAGNOSIS — K219 Gastro-esophageal reflux disease without esophagitis: Secondary | ICD-10-CM

## 2020-07-13 NOTE — Telephone Encounter (Signed)
Requested Prescriptions  Pending Prescriptions Disp Refills  . omeprazole (PRILOSEC) 40 MG capsule [Pharmacy Med Name: Omeprazole 40 MG Oral Capsule Delayed Release] 90 capsule 1    Sig: Take 1 capsule by mouth once daily     Gastroenterology: Proton Pump Inhibitors Passed - 07/13/2020 11:27 AM      Passed - Valid encounter within last 12 months    Recent Outpatient Visits          2 months ago Type 2 diabetes mellitus with other circulatory complication, with long-term current use of insulin ([place])   [facility] Juline Patch, MD   4 months ago Establishing care with new doctor, encounter for   [place], MD      Future Appointments            In 1 month Juline Patch, MD [facility], [facility]

## 2020-07-14 ENCOUNTER — Other Ambulatory Visit: Payer: Self-pay | Admitting: Surgery

## 2020-07-14 ENCOUNTER — Other Ambulatory Visit (HOSPITAL_BASED_OUTPATIENT_CLINIC_OR_DEPARTMENT_OTHER): Payer: Self-pay | Admitting: Surgery

## 2020-07-14 DIAGNOSIS — S46011A Strain of muscle(s) and tendon(s) of the rotator cuff of right shoulder, initial encounter: Secondary | ICD-10-CM | POA: Diagnosis not present

## 2020-07-14 DIAGNOSIS — G8929 Other chronic pain: Secondary | ICD-10-CM | POA: Diagnosis not present

## 2020-07-14 DIAGNOSIS — E1159 Type 2 diabetes mellitus with other circulatory complications: Secondary | ICD-10-CM | POA: Diagnosis not present

## 2020-07-14 DIAGNOSIS — M12811 Other specific arthropathies, not elsewhere classified, right shoulder: Secondary | ICD-10-CM | POA: Diagnosis not present

## 2020-07-14 DIAGNOSIS — M7581 Other shoulder lesions, right shoulder: Secondary | ICD-10-CM | POA: Diagnosis not present

## 2020-07-14 DIAGNOSIS — M25511 Pain in right shoulder: Secondary | ICD-10-CM | POA: Diagnosis not present

## 2020-07-14 DIAGNOSIS — Z9889 Other specified postprocedural states: Secondary | ICD-10-CM | POA: Diagnosis not present

## 2020-07-14 DIAGNOSIS — Z794 Long term (current) use of insulin: Secondary | ICD-10-CM | POA: Diagnosis not present

## 2020-08-01 ENCOUNTER — Ambulatory Visit
Admission: RE | Admit: 2020-08-01 | Discharge: 2020-08-01 | Disposition: A | Discharge status: Home / Self Care | Payer: Medicare Other | Source: Ambulatory Visit | Attending: Surgery | Admitting: Surgery

## 2020-08-01 ENCOUNTER — Other Ambulatory Visit: Payer: Self-pay

## 2020-08-01 DIAGNOSIS — M19011 Primary osteoarthritis, right shoulder: Secondary | ICD-10-CM | POA: Diagnosis not present

## 2020-08-01 DIAGNOSIS — M25511 Pain in right shoulder: Secondary | ICD-10-CM | POA: Diagnosis not present

## 2020-08-01 DIAGNOSIS — M7581 Other shoulder lesions, right shoulder: Secondary | ICD-10-CM | POA: Diagnosis not present

## 2020-08-01 DIAGNOSIS — G8929 Other chronic pain: Secondary | ICD-10-CM | POA: Diagnosis not present

## 2020-08-01 DIAGNOSIS — S46011A Strain of muscle(s) and tendon(s) of the rotator cuff of right shoulder, initial encounter: Secondary | ICD-10-CM | POA: Diagnosis not present

## 2020-08-01 DIAGNOSIS — Z9889 Other specified postprocedural states: Secondary | ICD-10-CM | POA: Diagnosis not present

## 2020-08-01 IMAGING — CT CT SHOULDER*R* W/O CM
2 series · 10 of 14 positions shown, 12 images · non-contrast
Comparison: None.

CLINICAL DATA: Chronic right shoulder pain. History of rotator cuff
repair

EXAM:
CT OF THE UPPER RIGHT EXTREMITY WITHOUT CONTRAST
TECHNIQUE: Multidetector CT imaging of the upper right extremity was performed
according to the standard protocol.

[Series 2: shoulder 2.00 ax · axial · 0.35mm/px · z∈[-1011,-912]mm · 5 of 91 slices shown, 7 images (1 of 2)]
[im 16/91  soft-tissue]
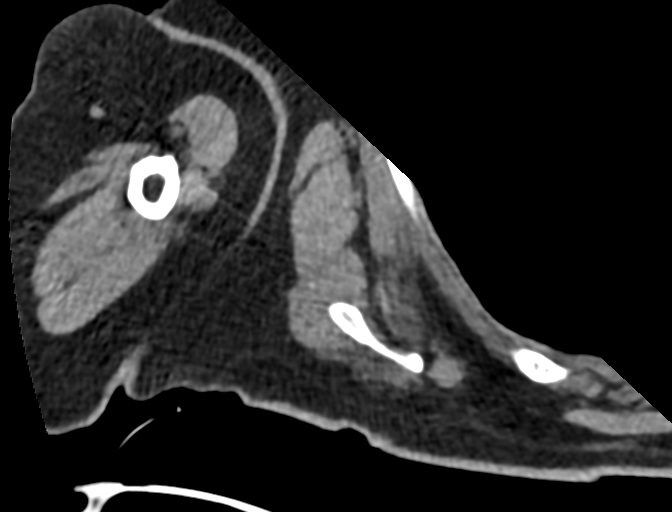
[im 16/91  bone]
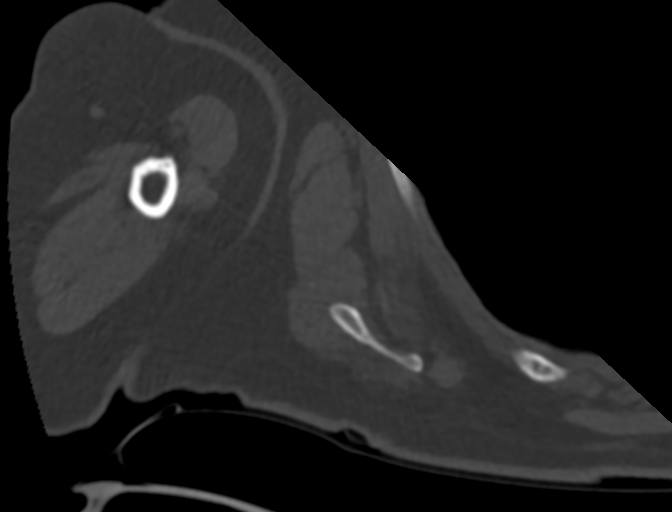
[im 31/91  bone]
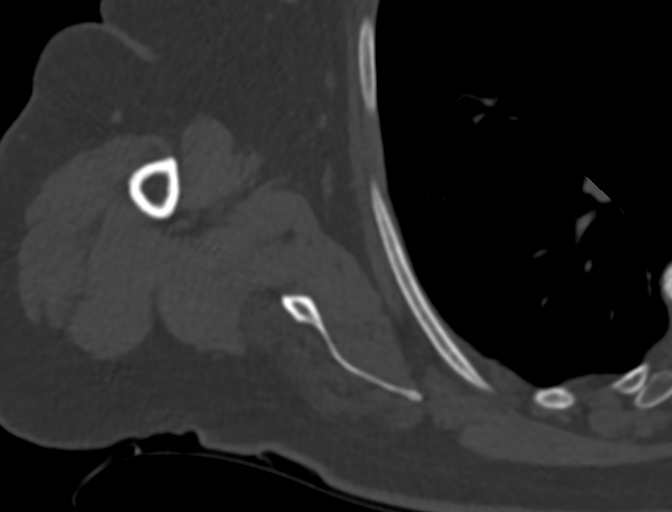
[im 46/91  bone]
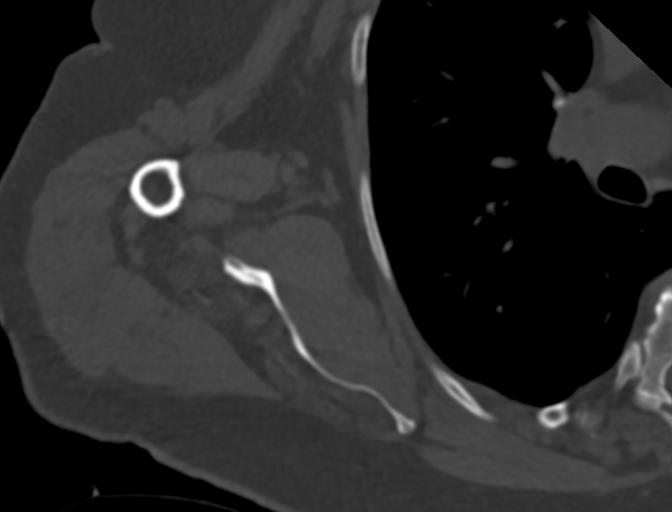
[im 61/91  bone]
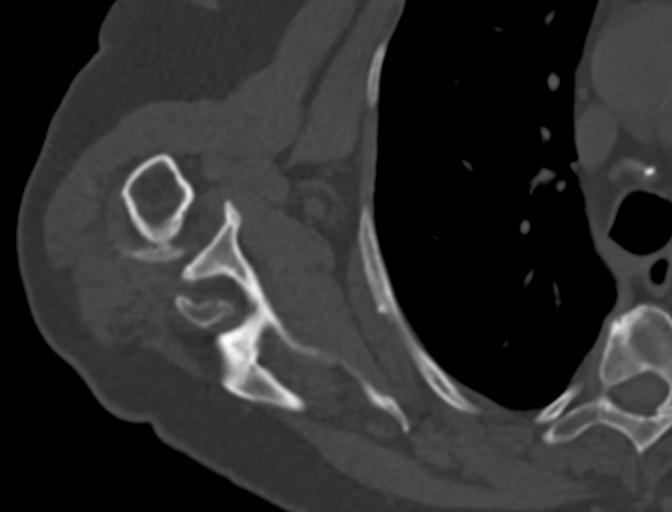
[im 76/91  soft-tissue]
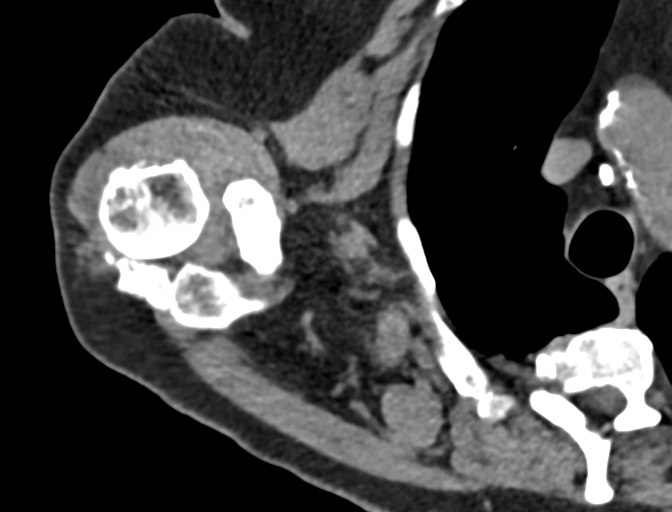
[im 76/91  bone]
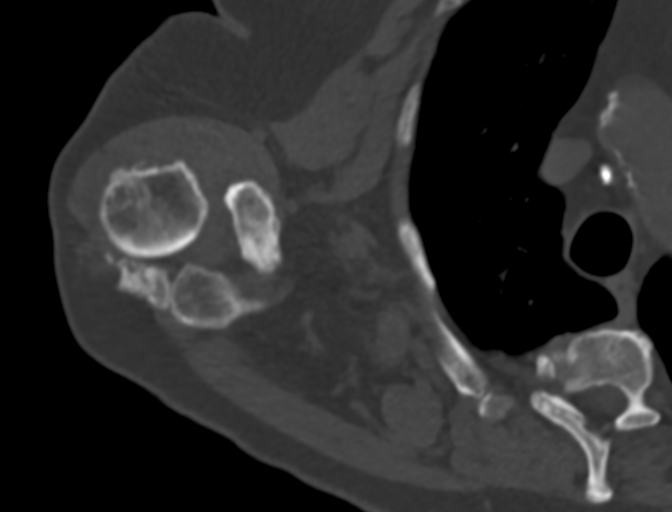

[Series 4: shoulder 2.00 ax · axial · 0.35mm/px · z∈[-1011,-912]mm · 5 of 91 slices shown (2 of 2)]
[im 16/91  bone]
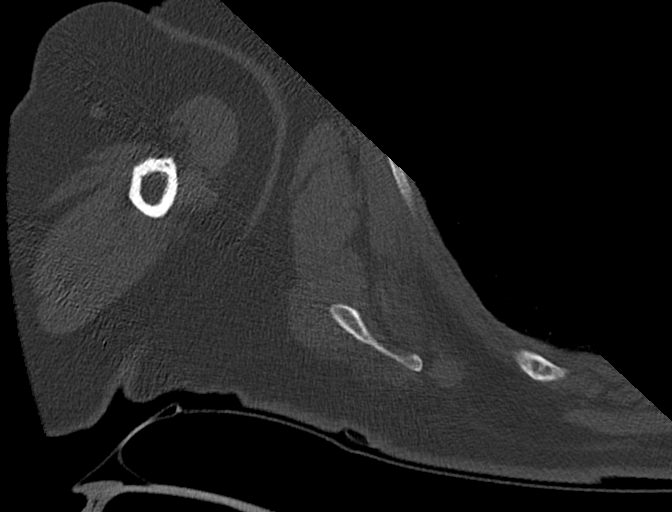
[im 31/91  bone]
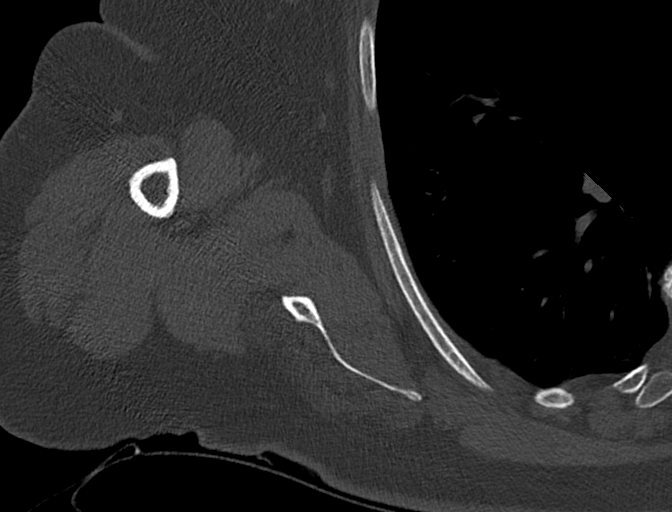
[im 46/91  bone]
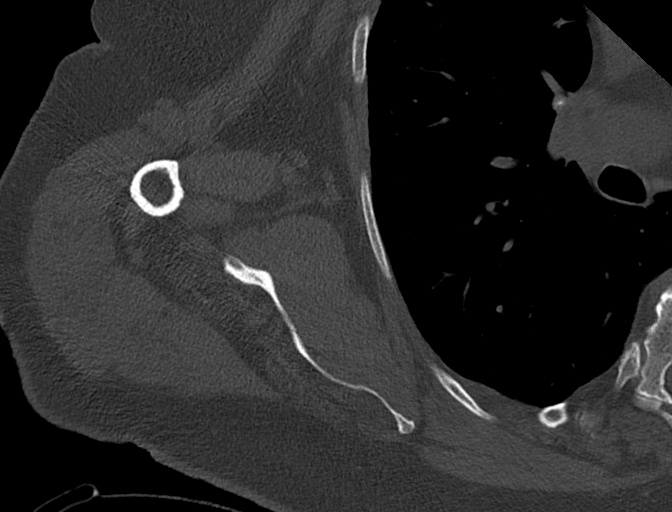
[im 61/91  bone]
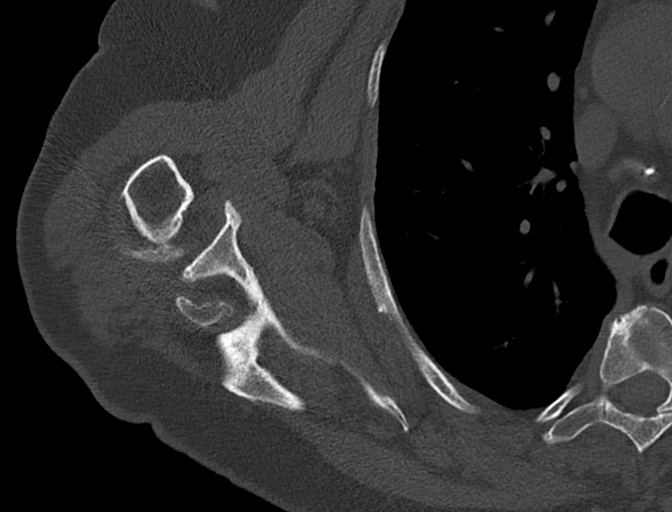
[im 76/91  bone]
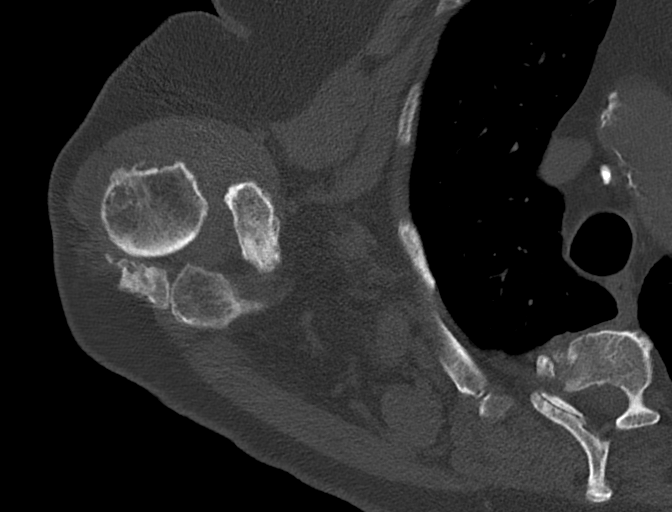

[10 of 14 positions shown; findings below may reference images not displayed]

FINDINGS: Bones/Joint/Cartilage

No acute fracture. No dislocation. Humeral head is superiorly and
slightly posteriorly subluxed relative to the glenoid. Severe
glenohumeral joint osteoarthritis manifested by joint space
narrowing, subchondral sclerosis/cystic change, and marginal
osteophyte formation. Multiple surgical anchor sites within the
greater tuberosity. There are subchondral cystic changes within the
glenoid, most pronounced superiorly. Humeral head articulates with
the undersurface of the acromion which has prominent subcortical
cystic changes and slight fragmentation. No large glenohumeral joint
effusion is evident.

Mild degenerative changes of the acromioclavicular joint. No large
subacromial-subdeltoid bursal fluid collection. Remaining visualized
osseous structures appear grossly intact.

Ligaments

Suboptimally assessed by CT.

Muscles and Tendons

Rotator cuff tendons appear chronically torn. Atrophy and fatty
infiltration of the supraspinatus, infraspinatus, and subscapularis
muscles. There is a large area of ossification within the region of
the infraspinatus muscle measuring 3.3 x 0.8 x 3.3 cm.

Soft tissues

No soft tissue fluid collection or hematoma. No axillary
lymphadenopathy. Visualized lung field is clear. Aortic
atherosclerosis.
IMPRESSION: 1. Severe right glenohumeral joint osteoarthritis with suggestion of
chronic full-thickness rotator cuff tear.
2. High-riding humeral head with remodeling and slight fragmentation
of the acromion.
3. Large area of ossification within the region of the infraspinatus
muscle measuring 3.3 x 0.8 x 3.3 cm.

Aortic Atherosclerosis ([KJ]-[KJ]).

## 2020-08-03 ENCOUNTER — Other Ambulatory Visit: Payer: Self-pay | Admitting: Family Medicine

## 2020-08-03 DIAGNOSIS — Z794 Long term (current) use of insulin: Secondary | ICD-10-CM

## 2020-08-03 DIAGNOSIS — E1159 Type 2 diabetes mellitus with other circulatory complications: Secondary | ICD-10-CM

## 2020-08-03 NOTE — Telephone Encounter (Signed)
Approved per protocol.  Requested Prescriptions  Pending Prescriptions Disp Refills  . TRULICITY 7.35 AP/0.1ID SOPN [Pharmacy Med Name: Trulicity 0.30 DT/1.4HO Subcutaneous Solution Pen-injector] 4 mL 0    Sig: INJECT [place] A WEEK     Endocrinology:  Diabetes - GLP-1 Receptor Agonists Passed - 08/03/2020  9:21 AM      Passed - HBA1C is between 0 and 7.9 and within 180 days    Hemoglobin A1C  Date Value Ref Range Status  12/28/2019 6.8  Final   Hgb A1c MFr Bld  Date Value Ref Range Status  04/28/2020 5.8 (H) 4.8 - 5.6 % Final    Comment:             Prediabetes: 5.7 - 6.4          Diabetes: >6.4          Glycemic control for adults with diabetes: <7.0          Passed - Valid encounter within last 6 months    Recent Outpatient Visits          3 months ago Type 2 diabetes mellitus with other circulatory complication, with long-term current use of insulin ([place])   [facility] Juline Patch, MD   5 months ago Establishing care with new doctor, encounter for   [facility] Juline Patch, MD      Future Appointments            In 3 weeks Juline Patch, MD [facility], [facility]

## 2020-08-19 ENCOUNTER — Other Ambulatory Visit: Payer: Self-pay | Admitting: Surgery

## 2020-08-22 ENCOUNTER — Encounter
Admission: RE | Admit: 2020-08-22 | Discharge: 2020-08-22 | Disposition: A | Discharge status: Home / Self Care | Payer: Medicare Other | Source: Ambulatory Visit | Attending: Surgery | Admitting: Surgery

## 2020-08-22 ENCOUNTER — Other Ambulatory Visit: Payer: Self-pay

## 2020-08-22 DIAGNOSIS — Z01818 Encounter for other preprocedural examination: Secondary | ICD-10-CM | POA: Insufficient documentation

## 2020-08-22 DIAGNOSIS — Z01810 Encounter for preprocedural cardiovascular examination: Secondary | ICD-10-CM | POA: Diagnosis not present

## 2020-08-22 HISTORY — DX: Hypothyroidism, unspecified: E03.9

## 2020-08-22 LAB — TYPE AND SCREEN
ABO/RH(D): A POS
Antibody Screen: NEGATIVE

## 2020-08-22 LAB — COMPREHENSIVE METABOLIC PANEL
ALT: 19 U/L (ref 0–44)
AST: 23 U/L (ref 15–41)
Albumin: 4.3 g/dL (ref 3.5–5.0)
Alkaline Phosphatase: 19 U/L — ABNORMAL LOW (ref 38–126)
Anion gap: 9 (ref 5–15)
BUN: 35 mg/dL — ABNORMAL HIGH (ref 8–23)
CO2: 28 mmol/L (ref 22–32)
Calcium: 9.8 mg/dL (ref 8.9–10.3)
Chloride: 101 mmol/L (ref 98–111)
Creatinine, Ser: 1.16 mg/dL — ABNORMAL HIGH (ref 0.44–1.00)
GFR, Estimated: 50 mL/min — ABNORMAL LOW (ref >60)
Glucose, Bld: 86 mg/dL (ref 70–99)
Potassium: 4.2 mmol/L (ref 3.5–5.1)
Sodium: 138 mmol/L (ref 135–145)
Total Bilirubin: 0.5 mg/dL (ref 0.3–1.2)
Total Protein: 7.2 g/dL (ref 6.5–8.1)

## 2020-08-22 LAB — SURGICAL PCR SCREEN
MRSA, PCR: NEGATIVE
Staphylococcus aureus: POSITIVE — AB

## 2020-08-22 LAB — CBC
HCT: 35 % — ABNORMAL LOW (ref 36.0–46.0)
Hemoglobin: 11.6 g/dL — ABNORMAL LOW (ref 12.0–15.0)
MCH: 31.4 pg (ref 26.0–34.0)
MCHC: 33.1 g/dL (ref 30.0–36.0)
MCV: 94.9 fL (ref 80.0–100.0)
Platelets: 349 10*3/uL (ref 150–400)
RBC: 3.69 MIL/uL — ABNORMAL LOW (ref 3.87–5.11)
RDW: 12.8 % (ref 11.5–15.5)
WBC: 9.5 10*3/uL (ref 4.0–10.5)
nRBC: 0 % (ref 0.0–0.2)

## 2020-08-22 NOTE — Patient Instructions (Addendum)
Your procedure is scheduled on:  Tuesday, April 26 Report to the Registration Desk on the 1st floor of the [facility]. To find out your arrival time, please call [phone] between 1PM - 3PM on: Monday, Apri 25  REMEMBER: Instructions that are not followed completely may result in serious medical risk, up to and including death; or upon the discretion of your surgeon and anesthesiologist your surgery may need to be rescheduled.  Do not eat food after midnight the night before surgery.  No gum chewing, lozengers or hard candies.  You may however, drink water up to 2 hours before you are scheduled to arrive for your surgery. Do not drink anything within 2 hours of your scheduled arrival time.  TAKE THESE MEDICATIONS THE MORNING OF SURGERY WITH A SIP OF WATER:  1.  Amlodipine 2.  Bupropion 3.  Clonazepam 4.  anora ellipta inhaler 5.  Levothyroxine 6.  Metoprolol 7.  Omeprazole - (take one the night before and one on the morning of surgery - helps to prevent nausea after surgery.)  Use inhalers on the day of surgery and bring your albuterol inhaler to the hospital.  DO NOT TAKE ANY INSULIN THE [place] OF SURGERY.   One week prior to surgery: STARTING April 19 Stop MELOXICAM, Anti-inflammatories (NSAIDS) such as Advil, Aleve, Ibuprofen, Motrin, Naproxen, Naprosyn and Aspirin based products such as Excedrin, Goodys Powder, BC Powder. Stop ANY OVER THE COUNTER supplements until after surgery.  No Alcohol for 24 hours before or after surgery.  No Smoking including e-cigarettes for 24 hours prior to surgery.  No chewable tobacco products for at least 6 hours prior to surgery.  No nicotine patches on the day of surgery.  Do not use any "recreational" drugs for at least a week prior to your surgery.  Please be advised that the combination of cocaine and anesthesia may have negative outcomes, up to and including death. If you test positive for cocaine, your surgery will be  cancelled.  On the morning of surgery brush your teeth with toothpaste and water, you may rinse your mouth with mouthwash if you wish. Do not swallow any toothpaste or mouthwash.  Do not wear jewelry, make-up, hairpins, clips or nail polish.  Do not wear lotions, powders, or perfumes.   Do not shave body from the neck down 48 hours prior to surgery just in case you cut yourself which could leave a site for infection.  Also, freshly shaved skin may become irritated if using the CHG soap.  Contact lenses, hearing aids and dentures may not be worn into surgery.  Do not bring valuables to the hospital. [facility] is not responsible for any missing/lost belongings or valuables.   Use CHG Soap or wipes as directed on instruction sheet.  Total Shoulder Arthroplasty:  use Benzolyl Peroxide 5% Gel as directed on instruction sheet.  Bring your C-PAP to the hospital with you in case you may have to spend the night.   Notify your doctor if there is any change in your medical condition (cold, fever, infection).  Wear comfortable clothing (specific to your surgery type) to the hospital.  Plan for stool softeners for home use; pain medications have a tendency to cause constipation. You can also help prevent constipation by eating foods high in fiber such as fruits and vegetables and drinking plenty of fluids as your diet allows.  After surgery, you can help prevent lung complications by doing breathing exercises.  Take deep breaths and cough every 1-2  hours. Your doctor may order a device called an Incentive Spirometer to help you take deep breaths.  If you are being admitted to the hospital overnight, leave your suitcase in the car. After surgery it may be brought to your room.  If you are being discharged the day of surgery, you will not be allowed to drive home. You will need a responsible adult (18 years or older) to drive you home and stay with you that night.   If you are taking public  transportation, you will need to have a responsible adult (18 years or older) with you. Please confirm with your physician that it is acceptable to use public transportation.   Please call the [place] Dept. at [phone] if you have any questions about these instructions.  Surgery Visitation Policy:  Patients undergoing a surgery or procedure may have one family member or support person with them as long as that person is not COVID-19 positive or experiencing its symptoms.  That person may remain in the waiting area during the procedure.  Inpatient Visitation:    Visiting hours are 7 a.m. to 8 p.m. Inpatients will be allowed two visitors daily. The visitors may change each day during the patient's stay. No visitors under the age of 54. Any visitor under the age of 25 must be accompanied by an adult. The visitor must pass COVID-19 screenings, use hand sanitizer when entering and exiting the patient's room and wear a mask at all times, including in the patient's room. Patients must also wear a mask when staff or their visitor are in the room. Masking is required regardless of vaccination status.

## 2020-08-24 ENCOUNTER — Other Ambulatory Visit: Payer: Self-pay | Admitting: Family Medicine

## 2020-08-24 DIAGNOSIS — E1159 Type 2 diabetes mellitus with other circulatory complications: Secondary | ICD-10-CM

## 2020-08-24 DIAGNOSIS — Z794 Long term (current) use of insulin: Secondary | ICD-10-CM

## 2020-08-24 NOTE — Telephone Encounter (Signed)
Requested Prescriptions  Pending Prescriptions Disp Refills  . TRULICITY 3.55 HR/4.1UL SOPN [Pharmacy Med Name: Trulicity 8.45 XM/4.6OE Subcutaneous Solution Pen-injector] 4 mL 0    Sig: INJECT [place] A WEEK     Endocrinology:  Diabetes - GLP-1 Receptor Agonists Passed - 08/24/2020  5:19 PM      Passed - HBA1C is between 0 and 7.9 and within 180 days    Hemoglobin A1C  Date Value Ref Range Status  12/28/2019 6.8  Final   Hgb A1c MFr Bld  Date Value Ref Range Status  04/28/2020 5.8 (H) 4.8 - 5.6 % Final    Comment:             Prediabetes: 5.7 - 6.4          Diabetes: >6.4          Glycemic control for adults with diabetes: <7.0          Passed - Valid encounter within last 6 months    Recent Outpatient Visits          4 months ago Type 2 diabetes mellitus with other circulatory complication, with long-term current use of insulin ([place])   [facility] Juline Patch, MD   6 months ago Establishing care with new doctor, encounter for   [place], MD      Future Appointments            In 1 month Juline Patch, MD [facility], [facility]

## 2020-08-25 DIAGNOSIS — G4733 Obstructive sleep apnea (adult) (pediatric): Secondary | ICD-10-CM | POA: Diagnosis not present

## 2020-08-26 ENCOUNTER — Other Ambulatory Visit: Admission: RE | Admit: 2020-08-26 | Payer: Medicare Other | Source: Ambulatory Visit

## 2020-08-29 ENCOUNTER — Other Ambulatory Visit: Payer: Medicare Other

## 2020-08-29 ENCOUNTER — Ambulatory Visit: Payer: Medicare Other | Admitting: Family Medicine

## 2020-09-01 ENCOUNTER — Other Ambulatory Visit
Admission: RE | Admit: 2020-09-01 | Discharge: 2020-09-01 | Disposition: A | Discharge status: Home / Self Care | Payer: Medicare Other | Source: Ambulatory Visit | Attending: Surgery | Admitting: Surgery

## 2020-09-01 ENCOUNTER — Other Ambulatory Visit: Payer: Self-pay

## 2020-09-01 DIAGNOSIS — E119 Type 2 diabetes mellitus without complications: Secondary | ICD-10-CM | POA: Diagnosis not present

## 2020-09-01 DIAGNOSIS — Z96653 Presence of artificial knee joint, bilateral: Secondary | ICD-10-CM | POA: Diagnosis not present

## 2020-09-01 DIAGNOSIS — Z79899 Other long term (current) drug therapy: Secondary | ICD-10-CM | POA: Diagnosis not present

## 2020-09-01 DIAGNOSIS — Z20822 Contact with and (suspected) exposure to covid-19: Secondary | ICD-10-CM | POA: Insufficient documentation

## 2020-09-01 DIAGNOSIS — E785 Hyperlipidemia, unspecified: Secondary | ICD-10-CM | POA: Diagnosis not present

## 2020-09-01 DIAGNOSIS — Z01812 Encounter for preprocedural laboratory examination: Secondary | ICD-10-CM | POA: Insufficient documentation

## 2020-09-01 DIAGNOSIS — S46011A Strain of muscle(s) and tendon(s) of the rotator cuff of right shoulder, initial encounter: Secondary | ICD-10-CM | POA: Diagnosis not present

## 2020-09-01 DIAGNOSIS — Z794 Long term (current) use of insulin: Secondary | ICD-10-CM | POA: Diagnosis not present

## 2020-09-01 DIAGNOSIS — F32A Depression, unspecified: Secondary | ICD-10-CM | POA: Diagnosis not present

## 2020-09-01 DIAGNOSIS — J449 Chronic obstructive pulmonary disease, unspecified: Secondary | ICD-10-CM | POA: Diagnosis not present

## 2020-09-01 DIAGNOSIS — I10 Essential (primary) hypertension: Secondary | ICD-10-CM | POA: Diagnosis not present

## 2020-09-01 DIAGNOSIS — E039 Hypothyroidism, unspecified: Secondary | ICD-10-CM | POA: Diagnosis not present

## 2020-09-01 DIAGNOSIS — Z7951 Long term (current) use of inhaled steroids: Secondary | ICD-10-CM | POA: Diagnosis not present

## 2020-09-01 DIAGNOSIS — Z87891 Personal history of nicotine dependence: Secondary | ICD-10-CM | POA: Diagnosis not present

## 2020-09-01 DIAGNOSIS — Z791 Long term (current) use of non-steroidal anti-inflammatories (NSAID): Secondary | ICD-10-CM | POA: Diagnosis not present

## 2020-09-01 DIAGNOSIS — K219 Gastro-esophageal reflux disease without esophagitis: Secondary | ICD-10-CM | POA: Diagnosis not present

## 2020-09-01 LAB — SARS CORONAVIRUS 2 (TAT 6-24 HRS): SARS Coronavirus 2: NEGATIVE

## 2020-09-01 MED ORDER — SODIUM CHLORIDE 0.9 % IV SOLN: INTRAVENOUS | Status: DC

## 2020-09-01 MED ORDER — CEFAZOLIN SODIUM-DEXTROSE 2-4 GM/100ML-% IV SOLN
2.0000 g | INTRAVENOUS | Status: AC
  Administered 2020-09-02: 2 g via INTRAVENOUS

## 2020-09-01 MED ORDER — ORAL CARE MOUTH RINSE
15.0000 mL | Freq: Once | OROMUCOSAL | Status: AC
Start: 2020-09-01 — End: 2020-09-02

## 2020-09-01 MED ORDER — CHLORHEXIDINE GLUCONATE 0.12 % MT SOLN: 15.0000 mL | Freq: Once | OROMUCOSAL | Status: AC

## 2020-09-02 ENCOUNTER — Encounter
Admission: AD | Disposition: A | Discharge status: Home / Self Care | Payer: Self-pay | Source: Home / Self Care | Attending: Surgery

## 2020-09-02 ENCOUNTER — Other Ambulatory Visit: Payer: Self-pay

## 2020-09-02 ENCOUNTER — Encounter: Payer: Self-pay | Admitting: Surgery

## 2020-09-02 ENCOUNTER — Inpatient Hospital Stay: Payer: Medicare Other

## 2020-09-02 ENCOUNTER — Ambulatory Visit: Payer: Medicare Other

## 2020-09-02 ENCOUNTER — Inpatient Hospital Stay
Admission: AD | Admit: 2020-09-02 | Discharge: 2020-09-06 | DRG: 483 | Disposition: A | Discharge status: Home Health | Payer: Medicare Other | Attending: Surgery | Admitting: Surgery

## 2020-09-02 DIAGNOSIS — K219 Gastro-esophageal reflux disease without esophagitis: Secondary | ICD-10-CM | POA: Diagnosis present

## 2020-09-02 DIAGNOSIS — Z7951 Long term (current) use of inhaled steroids: Secondary | ICD-10-CM | POA: Diagnosis not present

## 2020-09-02 DIAGNOSIS — G8918 Other acute postprocedural pain: Secondary | ICD-10-CM | POA: Diagnosis not present

## 2020-09-02 DIAGNOSIS — Z20822 Contact with and (suspected) exposure to covid-19: Secondary | ICD-10-CM | POA: Diagnosis present

## 2020-09-02 DIAGNOSIS — S46011A Strain of muscle(s) and tendon(s) of the rotator cuff of right shoulder, initial encounter: Principal | ICD-10-CM | POA: Diagnosis present

## 2020-09-02 DIAGNOSIS — E785 Hyperlipidemia, unspecified: Secondary | ICD-10-CM | POA: Diagnosis present

## 2020-09-02 DIAGNOSIS — Z419 Encounter for procedure for purposes other than remedying health state, unspecified: Secondary | ICD-10-CM

## 2020-09-02 DIAGNOSIS — Z96653 Presence of artificial knee joint, bilateral: Secondary | ICD-10-CM | POA: Diagnosis present

## 2020-09-02 DIAGNOSIS — Z791 Long term (current) use of non-steroidal anti-inflammatories (NSAID): Secondary | ICD-10-CM

## 2020-09-02 DIAGNOSIS — G8929 Other chronic pain: Secondary | ICD-10-CM | POA: Diagnosis not present

## 2020-09-02 DIAGNOSIS — E119 Type 2 diabetes mellitus without complications: Secondary | ICD-10-CM | POA: Diagnosis present

## 2020-09-02 DIAGNOSIS — Z820 Family history of epilepsy and other diseases of the nervous system: Secondary | ICD-10-CM

## 2020-09-02 DIAGNOSIS — E039 Hypothyroidism, unspecified: Secondary | ICD-10-CM | POA: Diagnosis present

## 2020-09-02 DIAGNOSIS — Z9889 Other specified postprocedural states: Secondary | ICD-10-CM | POA: Diagnosis not present

## 2020-09-02 DIAGNOSIS — Z87891 Personal history of nicotine dependence: Secondary | ICD-10-CM | POA: Diagnosis not present

## 2020-09-02 DIAGNOSIS — F4310 Post-traumatic stress disorder, unspecified: Secondary | ICD-10-CM | POA: Diagnosis present

## 2020-09-02 DIAGNOSIS — Z96611 Presence of right artificial shoulder joint: Secondary | ICD-10-CM | POA: Diagnosis not present

## 2020-09-02 DIAGNOSIS — Z79890 Hormone replacement therapy (postmenopausal): Secondary | ICD-10-CM

## 2020-09-02 DIAGNOSIS — J449 Chronic obstructive pulmonary disease, unspecified: Secondary | ICD-10-CM | POA: Diagnosis present

## 2020-09-02 DIAGNOSIS — F32A Depression, unspecified: Secondary | ICD-10-CM | POA: Diagnosis present

## 2020-09-02 DIAGNOSIS — X500XXA Overexertion from strenuous movement or load, initial encounter: Secondary | ICD-10-CM | POA: Diagnosis not present

## 2020-09-02 DIAGNOSIS — Z79899 Other long term (current) drug therapy: Secondary | ICD-10-CM | POA: Diagnosis not present

## 2020-09-02 DIAGNOSIS — I10 Essential (primary) hypertension: Secondary | ICD-10-CM | POA: Diagnosis present

## 2020-09-02 DIAGNOSIS — Z794 Long term (current) use of insulin: Secondary | ICD-10-CM

## 2020-09-02 DIAGNOSIS — M25511 Pain in right shoulder: Secondary | ICD-10-CM | POA: Diagnosis not present

## 2020-09-02 DIAGNOSIS — Z471 Aftercare following joint replacement surgery: Secondary | ICD-10-CM | POA: Diagnosis not present

## 2020-09-02 DIAGNOSIS — S46011D Strain of muscle(s) and tendon(s) of the rotator cuff of right shoulder, subsequent encounter: Secondary | ICD-10-CM | POA: Diagnosis present

## 2020-09-02 DIAGNOSIS — M12811 Other specific arthropathies, not elsewhere classified, right shoulder: Secondary | ICD-10-CM | POA: Diagnosis not present

## 2020-09-02 DIAGNOSIS — M7581 Other shoulder lesions, right shoulder: Secondary | ICD-10-CM | POA: Diagnosis not present

## 2020-09-02 HISTORY — PX: REVERSE SHOULDER ARTHROPLASTY: SHX5054

## 2020-09-02 LAB — ABO/RH: ABO/RH(D): A POS

## 2020-09-02 LAB — GLUCOSE, CAPILLARY
Glucose-Capillary: 115 mg/dL — ABNORMAL HIGH (ref 70–99)
Glucose-Capillary: 142 mg/dL — ABNORMAL HIGH (ref 70–99)
Glucose-Capillary: 148 mg/dL — ABNORMAL HIGH (ref 70–99)
Glucose-Capillary: 106 mg/dL — ABNORMAL HIGH (ref 70–99)
Glucose-Capillary: 109 mg/dL — ABNORMAL HIGH (ref 70–99)

## 2020-09-02 IMAGING — DX DG SHOULDER 2+V PORT*R*
2 series · 2 of 2 positions shown · non-contrast
Comparison: None.

CLINICAL DATA: Status post right reverse shoulder arthroplasty.

EXAM:
PORTABLE RIGHT SHOULDER

[shoulder ap]
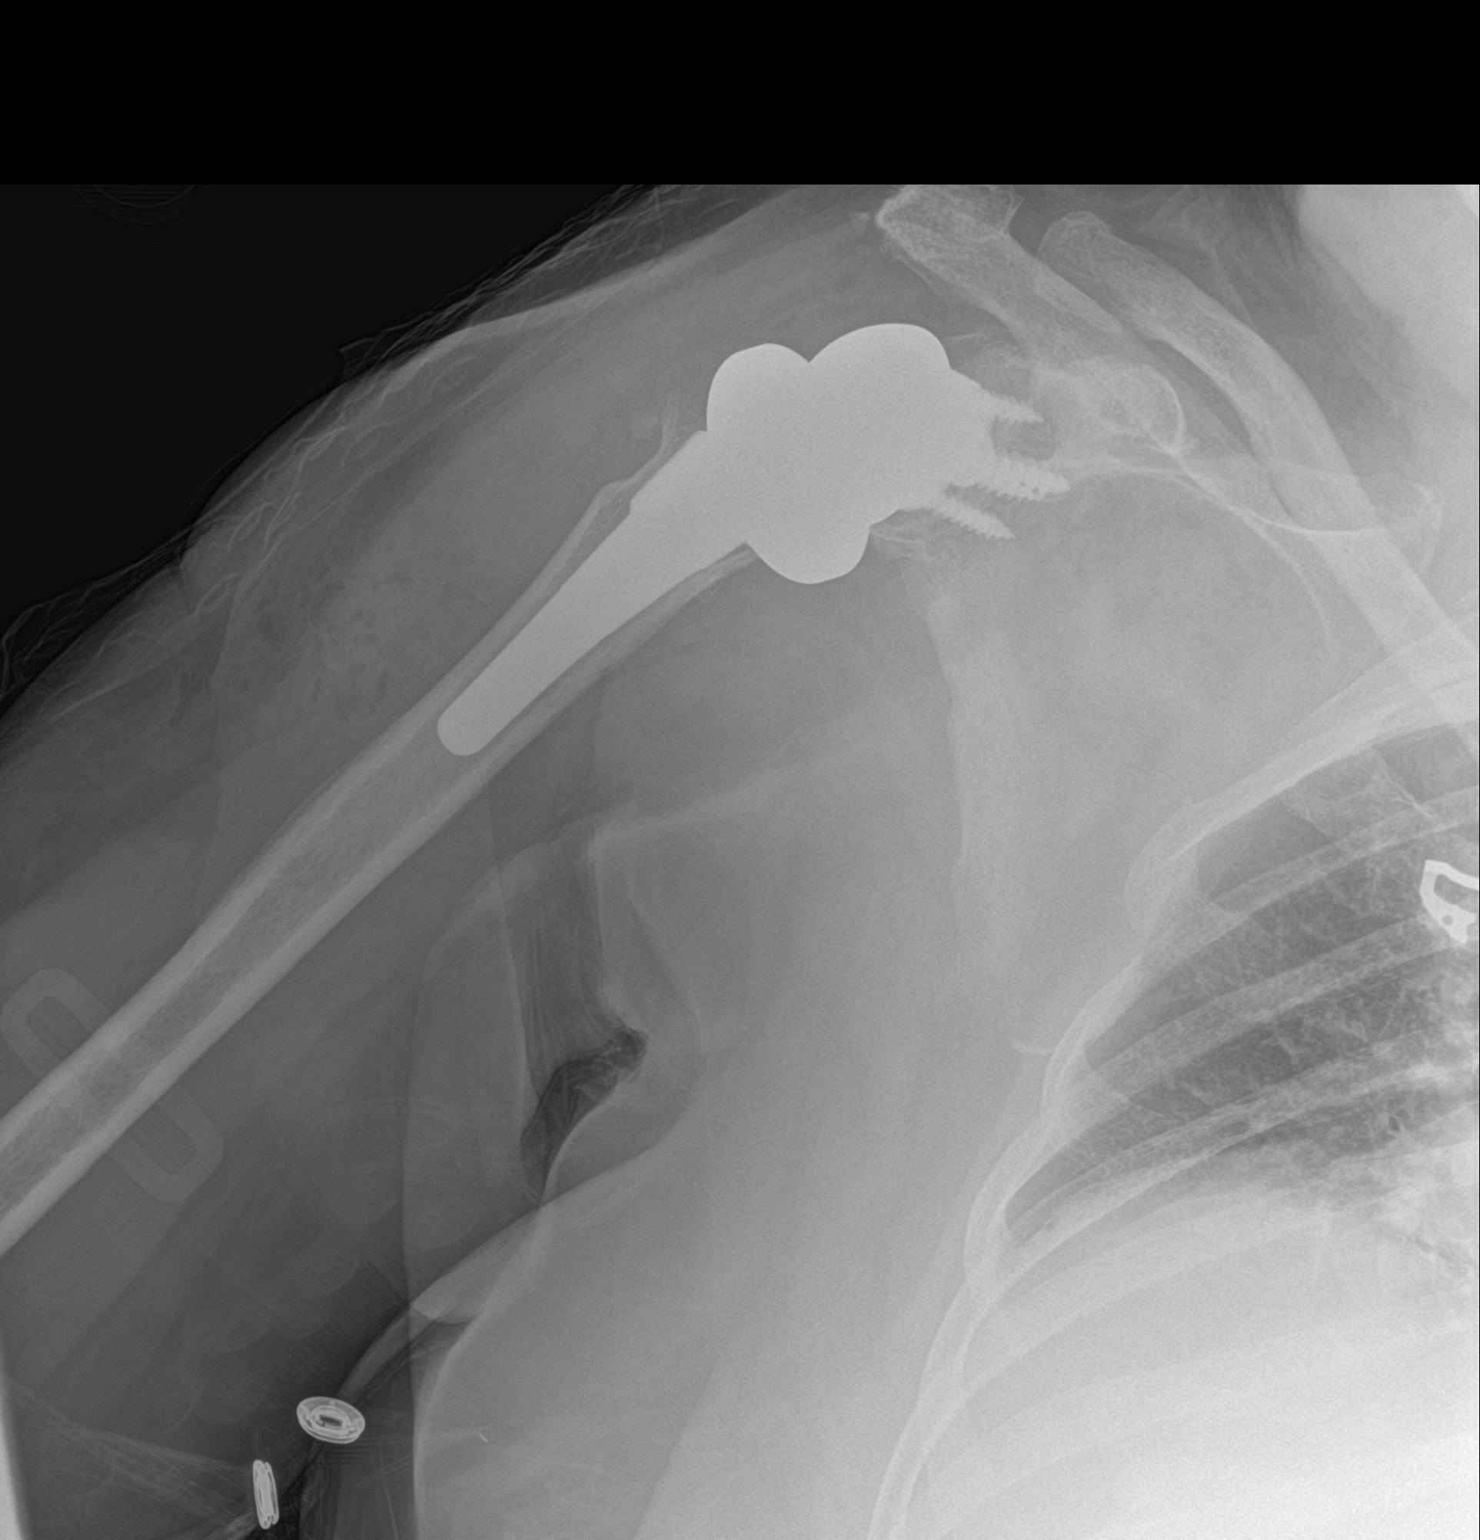

[shoulder obl]
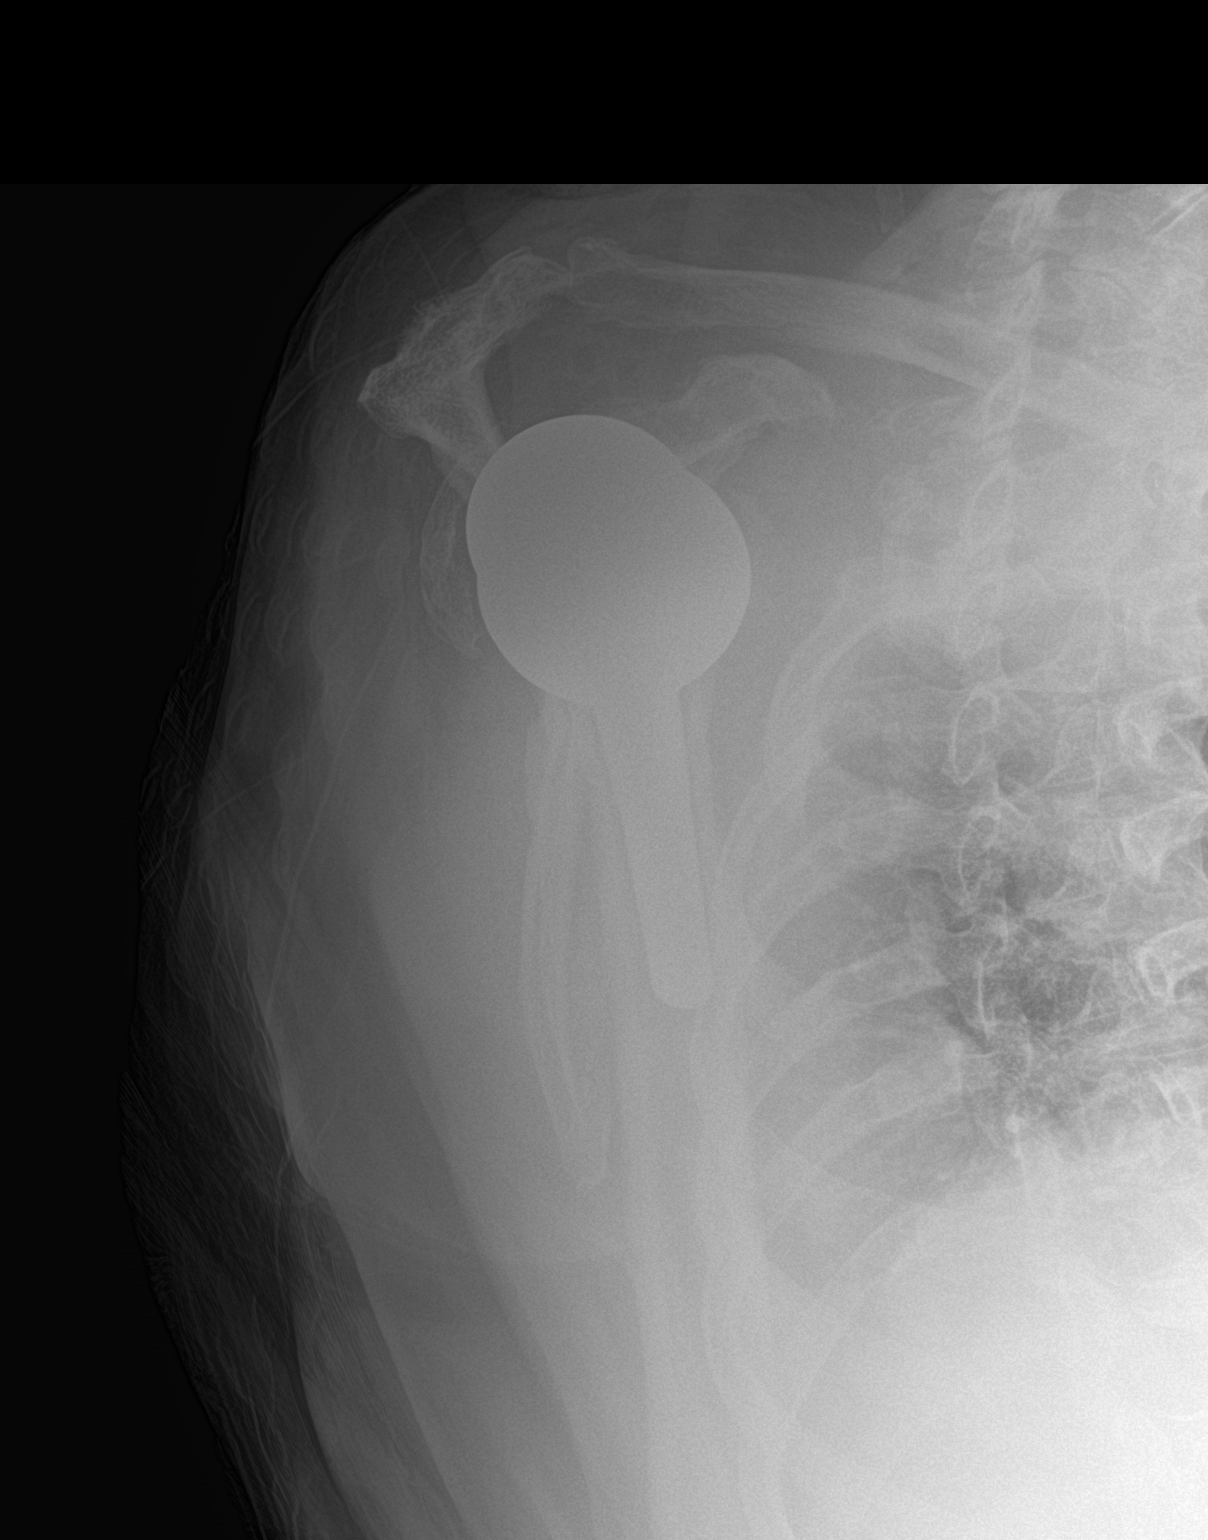

[2 of 2 positions shown; findings below may reference images not displayed]

FINDINGS: Two-view exam shows the patient to be status post right reverse
shoulder arthroplasty. No evidence for immediate hardware
complications. Bones are diffusely demineralized.
IMPRESSION: Postoperative changes without evidence for hardware complication.

## 2020-09-02 SURGERY — ARTHROPLASTY, SHOULDER, TOTAL, REVERSE
Anesthesia: General | Site: Shoulder | Laterality: Right

## 2020-09-02 MED ORDER — BUPIVACAINE-EPINEPHRINE (PF) 0.5% -1:200000 IJ SOLN
INTRAMUSCULAR | Status: DC | PRN
  Administered 2020-09-02: 30 mL via PERINEURAL

## 2020-09-02 MED ORDER — ROCURONIUM BROMIDE 10 MG/ML (PF) SYRINGE
PREFILLED_SYRINGE | INTRAVENOUS | Status: AC
  Filled 2020-09-02: qty 10

## 2020-09-02 MED ORDER — SUGAMMADEX SODIUM 200 MG/2ML IV SOLN
INTRAVENOUS | Status: DC | PRN
  Administered 2020-09-02: 200 mg via INTRAVENOUS

## 2020-09-02 MED ORDER — BUPIVACAINE HCL (PF) 0.5 % IJ SOLN
INTRAMUSCULAR | Status: AC
  Filled 2020-09-02: qty 10

## 2020-09-02 MED ORDER — CLONAZEPAM 1 MG PO TABS
1.5000 mg | ORAL_TABLET | Freq: Every day | ORAL | Status: DC
  Administered 2020-09-03 – 2020-09-06 (×4): 1.5 mg via ORAL
  Filled 2020-09-02 (×4): qty 1

## 2020-09-02 MED ORDER — FENTANYL CITRATE (PF) 100 MCG/2ML IJ SOLN: 25.0000 ug | INTRAMUSCULAR | Status: DC | PRN

## 2020-09-02 MED ORDER — OXYCODONE HCL 5 MG PO TABS
5.0000 mg | ORAL_TABLET | ORAL | Status: DC | PRN
  Administered 2020-09-03 (×2): 10 mg via ORAL
  Administered 2020-09-03 – 2020-09-04 (×2): 5 mg via ORAL
  Administered 2020-09-04 – 2020-09-05 (×4): 10 mg via ORAL
  Administered 2020-09-05: 5 mg via ORAL
  Administered 2020-09-05: 10 mg via ORAL
  Administered 2020-09-06 (×2): 5 mg via ORAL
  Filled 2020-09-02 (×3): qty 2
  Filled 2020-09-02: qty 1
  Filled 2020-09-02 (×3): qty 2
  Filled 2020-09-02: qty 1
  Filled 2020-09-02: qty 2
  Filled 2020-09-02 (×5): qty 1

## 2020-09-02 MED ORDER — EPHEDRINE SULFATE 50 MG/ML IJ SOLN
INTRAMUSCULAR | Status: DC | PRN
  Administered 2020-09-02 (×4): 5 mg via INTRAVENOUS

## 2020-09-02 MED ORDER — ZOLPIDEM TARTRATE 5 MG PO TABS: 5.0000 mg | ORAL_TABLET | Freq: Every evening | ORAL | Status: DC | PRN

## 2020-09-02 MED ORDER — DEXAMETHASONE SODIUM PHOSPHATE 10 MG/ML IJ SOLN
INTRAMUSCULAR | Status: DC | PRN
  Administered 2020-09-02: 5 mg via INTRAVENOUS

## 2020-09-02 MED ORDER — KETOROLAC TROMETHAMINE 0.5 % OP SOLN
1.0000 [drp] | Freq: Three times a day (TID) | OPHTHALMIC | Status: AC | PRN
  Filled 2020-09-02: qty 3

## 2020-09-02 MED ORDER — FERROUS SULFATE 325 (65 FE) MG PO TABS
325.0000 mg | ORAL_TABLET | Freq: Every day | ORAL | Status: DC
  Administered 2020-09-03 – 2020-09-06 (×4): 325 mg via ORAL
  Filled 2020-09-02 (×4): qty 1

## 2020-09-02 MED ORDER — ONDANSETRON HCL 4 MG/2ML IJ SOLN
INTRAMUSCULAR | Status: DC | PRN
  Administered 2020-09-02: 4 mg via INTRAVENOUS

## 2020-09-02 MED ORDER — BUPIVACAINE-EPINEPHRINE (PF) 0.5% -1:200000 IJ SOLN
INTRAMUSCULAR | Status: AC
  Filled 2020-09-02: qty 90

## 2020-09-02 MED ORDER — HAIR SKIN AND NAILS FORMULA PO TABS: ORAL_TABLET | Freq: Every day | ORAL | Status: DC

## 2020-09-02 MED ORDER — HYDROMORPHONE HCL 1 MG/ML IJ SOLN
0.2500 mg | INTRAMUSCULAR | Status: DC | PRN
Start: 2020-09-02 — End: 2020-09-06

## 2020-09-02 MED ORDER — CEFAZOLIN SODIUM-DEXTROSE 2-4 GM/100ML-% IV SOLN
2.0000 g | Freq: Four times a day (QID) | INTRAVENOUS | Status: AC
  Administered 2020-09-02 – 2020-09-03 (×3): 2 g via INTRAVENOUS
  Filled 2020-09-02 (×3): qty 100

## 2020-09-02 MED ORDER — TRAMADOL HCL 50 MG PO TABS
50.0000 mg | ORAL_TABLET | Freq: Four times a day (QID) | ORAL | Status: DC | PRN
  Administered 2020-09-03 – 2020-09-04 (×2): 50 mg via ORAL
  Filled 2020-09-02 (×2): qty 1

## 2020-09-02 MED ORDER — DEXMEDETOMIDINE (PRECEDEX) IN NS 20 MCG/5ML (4 MCG/ML) IV SYRINGE
PREFILLED_SYRINGE | INTRAVENOUS | Status: DC | PRN
  Administered 2020-09-02 (×2): 8 ug via INTRAVENOUS

## 2020-09-02 MED ORDER — ONDANSETRON HCL 4 MG/2ML IJ SOLN: 4.0000 mg | Freq: Once | INTRAMUSCULAR | Status: DC | PRN

## 2020-09-02 MED ORDER — METOCLOPRAMIDE HCL 10 MG PO TABS: 5.0000 mg | ORAL_TABLET | Freq: Three times a day (TID) | ORAL | Status: DC | PRN

## 2020-09-02 MED ORDER — ONDANSETRON HCL 4 MG PO TABS: 4.0000 mg | ORAL_TABLET | Freq: Four times a day (QID) | ORAL | Status: DC | PRN

## 2020-09-02 MED ORDER — FENTANYL CITRATE (PF) 100 MCG/2ML IJ SOLN
INTRAMUSCULAR | Status: AC
  Filled 2020-09-02: qty 2

## 2020-09-02 MED ORDER — TAB-A-VITE/IRON PO TABS
1.0000 | ORAL_TABLET | Freq: Every day | ORAL | Status: DC
  Administered 2020-09-03 – 2020-09-06 (×4): 1 via ORAL
  Filled 2020-09-02 (×4): qty 1

## 2020-09-02 MED ORDER — BSS IO SOLN
15.0000 mL | Freq: Once | INTRAOCULAR | Status: AC
  Administered 2020-09-02: 15 mL
  Filled 2020-09-02: qty 15

## 2020-09-02 MED ORDER — SUCCINYLCHOLINE CHLORIDE 200 MG/10ML IV SOSY
PREFILLED_SYRINGE | INTRAVENOUS | Status: AC
  Filled 2020-09-02: qty 10

## 2020-09-02 MED ORDER — VENLAFAXINE HCL ER 75 MG PO CP24
75.0000 mg | ORAL_CAPSULE | Freq: Every day | ORAL | Status: DC
  Administered 2020-09-03 – 2020-09-06 (×4): 75 mg via ORAL
  Filled 2020-09-02 (×4): qty 1

## 2020-09-02 MED ORDER — LIDOCAINE HCL (PF) 2 % IJ SOLN
INTRAMUSCULAR | Status: AC
  Filled 2020-09-02: qty 5

## 2020-09-02 MED ORDER — LEVOTHYROXINE SODIUM 50 MCG PO TABS
175.0000 ug | ORAL_TABLET | Freq: Every day | ORAL | Status: DC
  Administered 2020-09-03 – 2020-09-06 (×4): 175 ug via ORAL
  Filled 2020-09-02 (×5): qty 1

## 2020-09-02 MED ORDER — ALBUTEROL SULFATE HFA 108 (90 BASE) MCG/ACT IN AERS
2.0000 | INHALATION_SPRAY | Freq: Four times a day (QID) | RESPIRATORY_TRACT | Status: DC | PRN
  Filled 2020-09-02: qty 6.7

## 2020-09-02 MED ORDER — SIMVASTATIN 20 MG PO TABS
20.0000 mg | ORAL_TABLET | Freq: Every day | ORAL | Status: DC
  Administered 2020-09-02 – 2020-09-05 (×3): 20 mg via ORAL
  Filled 2020-09-02 (×4): qty 1

## 2020-09-02 MED ORDER — LISINOPRIL 5 MG PO TABS
5.0000 mg | ORAL_TABLET | Freq: Every day | ORAL | Status: DC
  Administered 2020-09-03 – 2020-09-05 (×3): 5 mg via ORAL
  Filled 2020-09-02 (×4): qty 1

## 2020-09-02 MED ORDER — FLEET ENEMA 7-19 GM/118ML RE ENEM: 1.0000 | ENEMA | Freq: Once | RECTAL | Status: DC | PRN

## 2020-09-02 MED ORDER — SODIUM CHLORIDE 0.9 % IV SOLN: INTRAVENOUS | Status: DC

## 2020-09-02 MED ORDER — MAGNESIUM HYDROXIDE 400 MG/5ML PO SUSP
30.0000 mL | Freq: Every day | ORAL | Status: DC | PRN
  Administered 2020-09-06: 30 mL via ORAL
  Filled 2020-09-02: qty 30

## 2020-09-02 MED ORDER — FENTANYL CITRATE (PF) 100 MCG/2ML IJ SOLN
INTRAMUSCULAR | Status: DC | PRN
  Administered 2020-09-02: 50 ug via INTRAVENOUS
  Administered 2020-09-02: 25 ug via INTRAVENOUS

## 2020-09-02 MED ORDER — METOCLOPRAMIDE HCL 5 MG/ML IJ SOLN
5.0000 mg | Freq: Three times a day (TID) | INTRAMUSCULAR | Status: DC | PRN
Start: 2020-09-02 — End: 2020-09-06

## 2020-09-02 MED ORDER — POLYMYXIN B-TRIMETHOPRIM 10000-0.1 UNIT/ML-% OP SOLN
1.0000 [drp] | Freq: Three times a day (TID) | OPHTHALMIC | Status: AC
  Administered 2020-09-02 (×2): 1 [drp] via OPHTHALMIC
  Filled 2020-09-02: qty 10

## 2020-09-02 MED ORDER — METOPROLOL SUCCINATE ER 50 MG PO TB24
100.0000 mg | ORAL_TABLET | Freq: Every day | ORAL | Status: DC
  Administered 2020-09-03 – 2020-09-06 (×4): 100 mg via ORAL
  Filled 2020-09-02 (×4): qty 2

## 2020-09-02 MED ORDER — LIDOCAINE HCL (CARDIAC) PF 100 MG/5ML IV SOSY
PREFILLED_SYRINGE | INTRAVENOUS | Status: DC | PRN
  Administered 2020-09-02: 90 mg via INTRAVENOUS

## 2020-09-02 MED ORDER — MIDAZOLAM HCL 2 MG/2ML IJ SOLN
INTRAMUSCULAR | Status: AC
  Filled 2020-09-02: qty 2

## 2020-09-02 MED ORDER — SODIUM CHLORIDE 0.9 % IV SOLN
INTRAVENOUS | Status: DC | PRN
  Administered 2020-09-02: 30 mL

## 2020-09-02 MED ORDER — BUPROPION HCL ER (SR) 100 MG PO TB12
200.0000 mg | ORAL_TABLET | Freq: Two times a day (BID) | ORAL | Status: DC
  Administered 2020-09-02 – 2020-09-06 (×8): 200 mg via ORAL
  Filled 2020-09-02 (×9): qty 2

## 2020-09-02 MED ORDER — BUPIVACAINE LIPOSOME 1.3 % IJ SUSP
INTRAMUSCULAR | Status: AC
  Filled 2020-09-02: qty 20

## 2020-09-02 MED ORDER — KETOROLAC TROMETHAMINE 15 MG/ML IJ SOLN
7.5000 mg | Freq: Four times a day (QID) | INTRAMUSCULAR | Status: AC
  Administered 2020-09-02 (×2): 7.5 mg via INTRAVENOUS
  Filled 2020-09-02 (×3): qty 1

## 2020-09-02 MED ORDER — EPHEDRINE 5 MG/ML INJ
INTRAVENOUS | Status: AC
  Filled 2020-09-02: qty 10

## 2020-09-02 MED ORDER — SUCCINYLCHOLINE CHLORIDE 20 MG/ML IJ SOLN
INTRAMUSCULAR | Status: DC | PRN
  Administered 2020-09-02: 100 mg via INTRAVENOUS

## 2020-09-02 MED ORDER — ACETAMINOPHEN 10 MG/ML IV SOLN
INTRAVENOUS | Status: AC
  Filled 2020-09-02: qty 100

## 2020-09-02 MED ORDER — ACETAMINOPHEN 325 MG PO TABS
325.0000 mg | ORAL_TABLET | Freq: Four times a day (QID) | ORAL | Status: DC | PRN
  Administered 2020-09-05 – 2020-09-06 (×2): 650 mg via ORAL
  Filled 2020-09-02 (×2): qty 2

## 2020-09-02 MED ORDER — ONDANSETRON HCL 4 MG/2ML IJ SOLN: 4.0000 mg | Freq: Four times a day (QID) | INTRAMUSCULAR | Status: DC | PRN

## 2020-09-02 MED ORDER — BISACODYL 10 MG RE SUPP
10.0000 mg | Freq: Every day | RECTAL | Status: DC | PRN
  Administered 2020-09-04: 10 mg via RECTAL
  Filled 2020-09-02: qty 1

## 2020-09-02 MED ORDER — CYCLOBENZAPRINE HCL 10 MG PO TABS
10.0000 mg | ORAL_TABLET | Freq: Three times a day (TID) | ORAL | Status: DC | PRN
  Administered 2020-09-03 – 2020-09-06 (×3): 10 mg via ORAL
  Filled 2020-09-02 (×3): qty 1

## 2020-09-02 MED ORDER — ENOXAPARIN SODIUM 40 MG/0.4ML ~~LOC~~ SOLN
40.0000 mg | SUBCUTANEOUS | Status: DC
  Administered 2020-09-03 – 2020-09-06 (×4): 40 mg via SUBCUTANEOUS
  Filled 2020-09-02 (×4): qty 0.4

## 2020-09-02 MED ORDER — CEFAZOLIN SODIUM-DEXTROSE 2-4 GM/100ML-% IV SOLN
INTRAVENOUS | Status: AC
  Filled 2020-09-02: qty 100

## 2020-09-02 MED ORDER — TRAZODONE HCL 50 MG PO TABS
50.0000 mg | ORAL_TABLET | Freq: Every day | ORAL | Status: DC
  Administered 2020-09-02 – 2020-09-05 (×4): 50 mg via ORAL
  Filled 2020-09-02 (×4): qty 1

## 2020-09-02 MED ORDER — TRANEXAMIC ACID 1000 MG/10ML IV SOLN
INTRAVENOUS | Status: DC | PRN
  Administered 2020-09-02: 1000 mg

## 2020-09-02 MED ORDER — DOCUSATE SODIUM 100 MG PO CAPS: 100.0000 mg | ORAL_CAPSULE | Freq: Two times a day (BID) | ORAL | Status: DC

## 2020-09-02 MED ORDER — ACETAMINOPHEN 10 MG/ML IV SOLN
INTRAVENOUS | Status: DC | PRN
  Administered 2020-09-02: 1000 mg via INTRAVENOUS

## 2020-09-02 MED ORDER — CHLORHEXIDINE GLUCONATE 0.12 % MT SOLN
OROMUCOSAL | Status: AC
  Administered 2020-09-02: 15 mL via OROMUCOSAL
  Filled 2020-09-02: qty 15

## 2020-09-02 MED ORDER — PROPOFOL 10 MG/ML IV BOLUS
INTRAVENOUS | Status: DC | PRN
  Administered 2020-09-02: 140 mg via INTRAVENOUS

## 2020-09-02 MED ORDER — DOCUSATE SODIUM 100 MG PO CAPS
200.0000 mg | ORAL_CAPSULE | Freq: Two times a day (BID) | ORAL | Status: DC
  Administered 2020-09-02 – 2020-09-06 (×8): 200 mg via ORAL
  Filled 2020-09-02 (×8): qty 2

## 2020-09-02 MED ORDER — PHENYLEPHRINE HCL (PRESSORS) 10 MG/ML IV SOLN
INTRAVENOUS | Status: DC | PRN
  Administered 2020-09-02: 100 ug via INTRAVENOUS

## 2020-09-02 MED ORDER — TRANEXAMIC ACID 1000 MG/10ML IV SOLN
INTRAVENOUS | Status: AC
  Filled 2020-09-02: qty 10

## 2020-09-02 MED ORDER — PANTOPRAZOLE SODIUM 40 MG PO TBEC
40.0000 mg | DELAYED_RELEASE_TABLET | Freq: Every day | ORAL | Status: DC
  Administered 2020-09-03 – 2020-09-06 (×4): 40 mg via ORAL
  Filled 2020-09-02 (×4): qty 1

## 2020-09-02 MED ORDER — KETOROLAC TROMETHAMINE 15 MG/ML IJ SOLN
INTRAMUSCULAR | Status: AC
  Administered 2020-09-02: 15 mg via INTRAVENOUS
  Filled 2020-09-02: qty 1

## 2020-09-02 MED ORDER — HYDROCHLOROTHIAZIDE 25 MG PO TABS
12.5000 mg | ORAL_TABLET | Freq: Every day | ORAL | Status: DC
  Administered 2020-09-03 – 2020-09-05 (×3): 12.5 mg via ORAL
  Filled 2020-09-02 (×4): qty 1

## 2020-09-02 MED ORDER — KETOROLAC TROMETHAMINE 15 MG/ML IJ SOLN: 15.0000 mg | Freq: Once | INTRAMUSCULAR | Status: AC

## 2020-09-02 MED ORDER — UMECLIDINIUM-VILANTEROL 62.5-25 MCG/INH IN AEPB
1.0000 | INHALATION_SPRAY | Freq: Every day | RESPIRATORY_TRACT | Status: DC
  Administered 2020-09-02 – 2020-09-06 (×5): 1 via RESPIRATORY_TRACT
  Filled 2020-09-02: qty 14

## 2020-09-02 MED ORDER — SODIUM CHLORIDE FLUSH 0.9 % IV SOLN
INTRAVENOUS | Status: AC
  Filled 2020-09-02: qty 40

## 2020-09-02 MED ORDER — INSULIN GLARGINE 100 UNIT/ML ~~LOC~~ SOLN
30.0000 [IU] | Freq: Every day | SUBCUTANEOUS | Status: DC
  Administered 2020-09-02 – 2020-09-06 (×5): 30 [IU] via SUBCUTANEOUS
  Filled 2020-09-02 (×5): qty 0.3

## 2020-09-02 MED ORDER — FENOFIBRATE 160 MG PO TABS
160.0000 mg | ORAL_TABLET | Freq: Every day | ORAL | Status: DC
  Administered 2020-09-03 – 2020-09-06 (×4): 160 mg via ORAL
  Filled 2020-09-02 (×4): qty 1

## 2020-09-02 MED ORDER — INSULIN ASPART 100 UNIT/ML ~~LOC~~ SOLN
0.0000 [IU] | Freq: Three times a day (TID) | SUBCUTANEOUS | Status: DC
  Administered 2020-09-03: 3 [IU] via SUBCUTANEOUS
  Administered 2020-09-04 (×2): 2 [IU] via SUBCUTANEOUS
  Administered 2020-09-05 (×2): 3 [IU] via SUBCUTANEOUS
  Administered 2020-09-05 – 2020-09-06 (×2): 2 [IU] via SUBCUTANEOUS
  Filled 2020-09-02 (×7): qty 1

## 2020-09-02 MED ORDER — PROPOFOL 10 MG/ML IV BOLUS
INTRAVENOUS | Status: AC
  Filled 2020-09-02: qty 40

## 2020-09-02 MED ORDER — POLYVINYL ALCOHOL 1.4 % OP SOLN
Freq: Three times a day (TID) | OPHTHALMIC | Status: DC | PRN
  Filled 2020-09-02: qty 15

## 2020-09-02 MED ORDER — BUPIVACAINE HCL (PF) 0.5 % IJ SOLN
INTRAMUSCULAR | Status: DC | PRN
  Administered 2020-09-02: 17 mL

## 2020-09-02 MED ORDER — AMLODIPINE BESYLATE 5 MG PO TABS
5.0000 mg | ORAL_TABLET | Freq: Every day | ORAL | Status: DC
  Administered 2020-09-03 – 2020-09-05 (×3): 5 mg via ORAL
  Filled 2020-09-02 (×5): qty 1

## 2020-09-02 MED ORDER — FENTANYL CITRATE (PF) 100 MCG/2ML IJ SOLN
INTRAMUSCULAR | Status: AC
  Administered 2020-09-02: 50 ug
  Filled 2020-09-02: qty 2

## 2020-09-02 MED ORDER — SODIUM CHLORIDE 0.9 % IV SOLN
INTRAVENOUS | Status: DC | PRN
  Administered 2020-09-02: 25 ug/min via INTRAVENOUS

## 2020-09-02 MED ORDER — LACTULOSE 10 GM/15ML PO SOLN
10.0000 g | Freq: Every day | ORAL | Status: DC | PRN
  Administered 2020-09-04: 10 g via ORAL
  Filled 2020-09-02: qty 30

## 2020-09-02 MED ORDER — DIPHENHYDRAMINE HCL 12.5 MG/5ML PO ELIX: 12.5000 mg | ORAL_SOLUTION | ORAL | Status: DC | PRN

## 2020-09-02 MED ORDER — ACETAMINOPHEN 500 MG PO TABS
1000.0000 mg | ORAL_TABLET | Freq: Four times a day (QID) | ORAL | Status: AC
  Administered 2020-09-02 – 2020-09-03 (×3): 1000 mg via ORAL
  Filled 2020-09-02 (×4): qty 2

## 2020-09-02 MED ORDER — PHENYLEPHRINE HCL (PRESSORS) 10 MG/ML IV SOLN
INTRAVENOUS | Status: AC
  Filled 2020-09-02: qty 1

## 2020-09-02 MED ORDER — LURASIDONE HCL 40 MG PO TABS
120.0000 mg | ORAL_TABLET | Freq: Every day | ORAL | Status: DC
  Administered 2020-09-03 – 2020-09-06 (×4): 120 mg via ORAL
  Filled 2020-09-02 (×4): qty 3

## 2020-09-02 MED ORDER — LIDOCAINE HCL (PF) 1 % IJ SOLN
INTRAMUSCULAR | Status: AC
  Filled 2020-09-02: qty 5

## 2020-09-02 MED ORDER — ROCURONIUM BROMIDE 100 MG/10ML IV SOLN
INTRAVENOUS | Status: DC | PRN
  Administered 2020-09-02: 40 mg via INTRAVENOUS
  Administered 2020-09-02: 10 mg via INTRAVENOUS
  Administered 2020-09-02 (×2): 20 mg via INTRAVENOUS

## 2020-09-02 MED ORDER — LIDOCAINE HCL (CARDIAC) PF 100 MG/5ML IV SOSY: PREFILLED_SYRINGE | INTRAVENOUS | Status: DC | PRN

## 2020-09-02 SURGICAL SUPPLY — 75 items
APL PRP STRL LF DISP 70% ISPRP (MISCELLANEOUS) ×1
BASEPLATE AUG MED W-TAPER (Plate) ×2 IMPLANT
BEARING HUMERAL SHLDER 36M STD (Shoulder) ×1 IMPLANT
BIT DRILL 2.7 W/STOP DISP (BIT) ×2 IMPLANT
BIT DRILL TWIST 2.7 (BIT) ×2 IMPLANT
BLADE SAW SAG 25X90X1.19 (BLADE) ×2 IMPLANT
BNDG COHESIVE 6X5 TAN STRL LF (GAUZE/BANDAGES/DRESSINGS) ×2 IMPLANT
BRNG HUM STD 36 RVRS SHLDR (Shoulder) ×1 IMPLANT
BSPLAT GLND MED AUG TPR ADPR (Plate) ×1 IMPLANT
CHLORAPREP W/TINT 26 (MISCELLANEOUS) ×2 IMPLANT
COOLER POLAR GLACIER W/PUMP (MISCELLANEOUS) ×2 IMPLANT
COVER BACK TABLE REUSABLE LG (DRAPES) ×2 IMPLANT
COVER WAND RF STERILE (DRAPES) ×2 IMPLANT
DRAPE 3/4 80X56 (DRAPES) ×4 IMPLANT
DRAPE IMP U-DRAPE 54X76 (DRAPES) ×4 IMPLANT
DRAPE INCISE IOBAN 66X45 STRL (DRAPES) ×4 IMPLANT
DRSG OPSITE POSTOP 4X8 (GAUZE/BANDAGES/DRESSINGS) ×2 IMPLANT
DRSG TEGADERM 2-3/8X2-3/4 SM (GAUZE/BANDAGES/DRESSINGS) ×4 IMPLANT
ELECT BLADE 6.5 EXT (BLADE) IMPLANT
ELECT CAUTERY BLADE 6.4 (BLADE) ×4 IMPLANT
ELECT REM PT RETURN 9FT ADLT (ELECTROSURGICAL) ×2
ELECTRODE REM PT RTRN 9FT ADLT (ELECTROSURGICAL) ×1 IMPLANT
GAUZE XEROFORM 1X8 LF (GAUZE/BANDAGES/DRESSINGS) ×2 IMPLANT
GLENOID SPHERE STD STRL 36MM (Orthopedic Implant) ×2 IMPLANT
GLOVE SRG 8 PF TXTR STRL LF DI (GLOVE) ×1 IMPLANT
GLOVE SURG ENC MOIS LTX SZ7.5 (GLOVE) ×8 IMPLANT
GLOVE SURG ENC MOIS LTX SZ8 (GLOVE) ×8 IMPLANT
GLOVE SURG UNDER LTX SZ8 (GLOVE) ×2 IMPLANT
GLOVE SURG UNDER POLY LF SZ8 (GLOVE) ×2
GOWN STRL REUS W/ TWL LRG LVL3 (GOWN DISPOSABLE) ×1 IMPLANT
GOWN STRL REUS W/ TWL XL LVL3 (GOWN DISPOSABLE) ×1 IMPLANT
GOWN STRL REUS W/TWL LRG LVL3 (GOWN DISPOSABLE) ×2
GOWN STRL REUS W/TWL XL LVL3 (GOWN DISPOSABLE) ×2
HOOD PEEL AWAY FLYTE STAYCOOL (MISCELLANEOUS) ×10 IMPLANT
IV NS IRRIG 3000ML ARTHROMATIC (IV SOLUTION) ×2 IMPLANT
KIT STABILIZATION SHOULDER (MISCELLANEOUS) ×2 IMPLANT
KIT TURNOVER KIT A (KITS) ×2 IMPLANT
MANIFOLD NEPTUNE II (INSTRUMENTS) ×2 IMPLANT
MASK FACE SPIDER DISP (MASK) ×2 IMPLANT
MAT ABSORB  FLUID 56X50 GRAY (MISCELLANEOUS) ×1
MAT ABSORB FLUID 56X50 GRAY (MISCELLANEOUS) ×1 IMPLANT
NDL SAFETY ECLIPSE 18X1.5 (NEEDLE) ×1 IMPLANT
NEEDLE HYPO 18GX1.5 SHARP (NEEDLE) ×2
NEEDLE HYPO 22GX1.5 SAFETY (NEEDLE) ×2 IMPLANT
NEEDLE SPNL 20GX3.5 QUINCKE YW (NEEDLE) ×2 IMPLANT
NS IRRIG 500ML POUR BTL (IV SOLUTION) ×2 IMPLANT
PACK ARTHROSCOPY SHOULDER (MISCELLANEOUS) ×2 IMPLANT
PAD ARMBOARD 7.5X6 YLW CONV (MISCELLANEOUS) ×2 IMPLANT
PAD WRAPON POLAR SHDR UNIV (MISCELLANEOUS) ×1 IMPLANT
PENCIL SMOKE EVACUATOR (MISCELLANEOUS) ×2 IMPLANT
PIN THREADED REVERSE (PIN) ×2 IMPLANT
PULSAVAC PLUS IRRIG FAN TIP (DISPOSABLE) ×2
REAMER GUIDE BUSHING SURG DISP (MISCELLANEOUS) ×2 IMPLANT
REAMER GUIDE W/SCREW AUG (MISCELLANEOUS) ×2 IMPLANT
SCREW BONE STRL 6.5MMX25MM (Screw) ×2 IMPLANT
SCREW LOCKING 4.75MMX15MM (Screw) ×2 IMPLANT
SCREW LOCKING NS 4.75MMX20MM (Screw) ×4 IMPLANT
SCREW LOCKING STRL 4.75X25X3.5 (Screw) ×2 IMPLANT
SHOULDER HUMERAL BEAR 36M STD (Shoulder) ×2 IMPLANT
SLING ULTRA II M (MISCELLANEOUS) ×2 IMPLANT
STAPLER SKIN PROX 35W (STAPLE) ×2 IMPLANT
STEM HUMERAL STRL 7MMX55MM (Stem) ×2 IMPLANT
STRIP CLOSURE SKIN 1/2X4 (GAUZE/BANDAGES/DRESSINGS) ×2 IMPLANT
SUT ETHIBOND 0 MO6 C/R (SUTURE) ×2 IMPLANT
SUT FIBERWIRE #2 38 BLUE 1/2 (SUTURE) ×6
SUT VIC AB 0 CT1 36 (SUTURE) ×2 IMPLANT
SUT VIC AB 2-0 CT1 27 (SUTURE) ×4
SUT VIC AB 2-0 CT1 TAPERPNT 27 (SUTURE) ×2 IMPLANT
SUTURE FIBERWR #2 38 BLUE 1/2 (SUTURE) ×3 IMPLANT
SWABSTK COMLB BENZOIN TINCTURE (MISCELLANEOUS) ×2 IMPLANT
SYR 10ML LL (SYRINGE) ×2 IMPLANT
TIP FAN IRRIG PULSAVAC PLUS (DISPOSABLE) ×1 IMPLANT
TOWEL OR 17X26 4PK STRL BLUE (TOWEL DISPOSABLE) ×2 IMPLANT
TRAY HUM REV SHOULDER STD +6 (Shoulder) ×2 IMPLANT
WRAPON POLAR PAD SHDR UNIV (MISCELLANEOUS) ×2

## 2020-09-02 NOTE — Evaluation (Signed)
Occupational Therapy Evaluation Patient Details Name: Angela Fernandez MRN: [id number] DOB: 07/14/1945 Today's Date: 09/02/2020    History of Present Illness Patient is a 75 year old female with traumatic complete tear of right rotator cuff with shoulder pain and weakness. History of previous right TSA. s/p reverse right TSA on 09/02/20. PMHx includes COPD, anxiety, depression, bipolar disorder, GERD, HTN, anemia, DMII, HLD, PTSD, and schizoaffective disorder, and TIA (2010). Patient had anesthetic complication of right sided corneal abrasion per anesthesia note.   Clinical Impression   Patient was seen for an OT evaluation this date. Pt is living with her daughter who reports will be providing care for pt at discharge. Prior to surgery, pt was generally independent, only using a SPC for longer community distances and having increasing difficulty with overhead tasks such as hair care using RUE 2/2 pain and decreased ROM. Pt received in bed, very groggy/lethargic initially but improved with repositioning and lighting and increased engagement in session. Dtr present throughout session. Pt has orders for RUE to be immobilized and will be NWBing per MD. Patient presents with impaired strength/ROM, pain, and sensation to RUE with block not resolved yet. These impairments result in a decreased ability to perform self care tasks requiring max assist for UB/LB dressing and bathing and max assist for application of polar care and sling/immobilizer. Pt/dtr instructed in polar care mgt, sling/immobilizer mgt, ROM exercises for RUE, RUE precautions, adaptive strategies for bathing/dressing/toileting/grooming, positioning and considerations for sleep, and home/routines modifications to maximize falls prevention, safety, and independence. Handout provided. OT adjusted sling/immobilizer and polar care to improve comfort, optimize positioning, and to maximize skin integrity/safety. Will benefit from additional adjustment next  date. Pt/dtr verbalized understanding of all education/training provided. Pt will benefit from skilled OT services to address these limitations and improve independence in daily tasks. Recommend HHOT services to continue therapy to maximize return to PLOF, address home/routines modifications and safety, minimize falls risk, and minimize caregiver burden.       Follow Up Recommendations  Home health OT    Equipment Recommendations  Other (comment) (grab bars installed in tub/shower)    Recommendations for Other Services       Precautions / Restrictions Precautions Precautions: Fall;Shoulder Shoulder Interventions: Shoulder sling/immobilizer;At all times;Shoulder abduction pillow;Off for dressing/bathing/exercises Precaution Booklet Issued: Yes (comment) Required Braces or Orthoses: Sling Restrictions Weight Bearing Restrictions: Yes RUE Weight Bearing: Non weight bearing      Mobility Bed Mobility Overal bed mobility: Needs Assistance Bed Mobility: Supine to Sit;Sit to Supine     Supine to sit: Supervision;HOB elevated Sit to supine: Min assist;HOB elevated   General bed mobility comments: deferred 2/2 very groggy    Transfers Overall transfer level: Needs assistance Equipment used: None Transfers: Sit to/from Stand Sit to Stand: Min guard         General transfer comment: Min guard for safety. 2 bouts of standing performed. patient able to maintain NWB of RUE with functional mobility    Balance Overall balance assessment: Needs assistance;History of Falls Sitting-balance support: Feet supported Sitting balance-Leahy Scale: Good     Standing balance support: No upper extremity supported Standing balance-Leahy Scale: Fair                             ADL either performed or assessed with clinical judgement   ADL Overall ADL's : Needs assistance/impaired  General ADL Comments: Pt currently  requires MAX A for grooming, bathing, dressing, and pericare; ADL transfers deferred during evaluation 2/2 lethargy. Dtr reports able to provide needed level of assist at dtr's home     Vision Baseline Vision/History: Wears glasses Wears Glasses: Reading only Patient Visual Report: Blurring of vision Additional Comments: Pt reports R eye blurriness and discomfort "feels like sand is in my eye"     Perception     Praxis      Pertinent Vitals/Pain Pain Assessment: Faces Pain Score: 8  Faces Pain Scale: Hurts little more Pain Location: right eye Pain Descriptors / Indicators: Burning;Discomfort Pain Intervention(s): Monitored during session;Premedicated before session     Hand Dominance Right   Extremity/Trunk Assessment Upper Extremity Assessment Upper Extremity Assessment: RUE deficits/detail RUE Deficits / Details: impaired RUE sensation. patient reports thumb is still numb. Patient able to activate hand movement. Did not assess elbow or shoulder ROM as sling in place RUE: Unable to fully assess due to immobilization RUE Sensation: decreased light touch;decreased proprioception RUE Coordination: decreased gross motor;decreased fine motor   Lower Extremity Assessment Lower Extremity Assessment: Overall WFL for tasks assessed RLE Deficits / Details: 5/5 strength knee extension, ankle dorsiflexion and plantarflexion. RLE Sensation: WNL LLE Deficits / Details: 5/5 strength knee extension, ankle dorsiflexion and plantarflexion. LLE Sensation: WNL       Communication Communication Communication: No difficulties   Cognition Arousal/Alertness: Suspect due to medications Behavior During Therapy: WFL for tasks assessed/performed Overall Cognitive Status: Within Functional Limits for tasks assessed                                 General Comments: pt very sleepy/groggy, improved a little with repositioning and lights for session   General Comments  patient does  complain of right eye pain and blurry vision. nurse brought eye drops during session and patient reports this helps with the pain    Exercises Other Exercises Other Exercises: Pt/dtr instructed in polar care mgt, shoulder sling/immobilizer mgt, falls prevention, home/routines modifications, hemi techniques for grooming, bathing, and dressing, bed mobility and positioning of RUE while at rest, RUE precautions, and RUE PROM/AROM restrictions; handout provided   Shoulder Instructions      Home Living Family/patient expects to be discharged to:: Private residence Living Arrangements: Children (daughter and other family members) Available Help at Discharge: Family;Available PRN/intermittently Type of Home: House Home Access: Stairs to enter [employer] of Steps: 4 Entrance Stairs-Rails: Can reach both Home Layout: Two level;Bed/bath upstairs Alternate Level Stairs-Number of Steps: 1 flight Alternate Level Stairs-Rails: Left (left side rail going up) Bathroom Shower/Tub: Tub/shower unit (with glass slide doors that have bar)   Bathroom Toilet: Handicapped height     Home Equipment: Cane - single point;Hand held shower head          Prior Functioning/Environment Level of Independence: Needs assistance;Independent with assistive device(s)  Gait / Transfers Assistance Needed: Patient needs a single point cane for ambulation outside the home. no device used inside the house. There was a fall that occured at the daughter's home where the patient missed a step on the stairs. ADL's / Homemaking Assistance Needed: Pt/dtr endorse pt was modified independent with basic ADL (some difficulty with hair care 2/2 RUE ROM limitations), able to do laundry, dusting, dishes, and watered plants; dtr sets up medications   Comments: Patient needs a single point cane for ambulation outside the home. no device  used inside the house. There was a fall that occured at the daughter's home where the  patient missed a step on the stairs.        OT Problem List: Decreased strength;Pain;Decreased range of motion;Impaired sensation;Decreased cognition;Decreased activity tolerance;Impaired balance (sitting and/or standing);Decreased knowledge of use of DME or AE;Impaired UE functional use;Impaired vision/perception;Decreased knowledge of precautions      OT Treatment/Interventions: Self-care/ADL training;Therapeutic exercise;Therapeutic activities;DME and/or AE instruction;Patient/family education;Balance training;Manual therapy;Visual/perceptual remediation/compensation    OT Goals(Current goals can be found in the care plan section) Acute Rehab OT Goals Patient Stated Goal: get better and go home OT Goal Formulation: With patient/family Time For Goal Achievement: 09/16/20 Potential to Achieve Goals: Good ADL Goals Pt Will Perform Upper Body Dressing: with caregiver independent in assisting Pt Will Perform Lower Body Dressing: with caregiver independent in assisting Pt Will Transfer to Toilet: with supervision;ambulating (comfort height toilet, LRAD for amb) Additional ADL Goal #1: Pt will independently instruct family/caregiver in polar care mgt Additional ADL Goal #2: Pt will independently instruct family/caregiver in shoulder sling/immobilizer  OT Frequency: Min 2X/week   Barriers to D/C:            Co-evaluation              AM-PAC OT "6 Clicks" Daily Activity     Outcome Measure Help from another person eating meals?: A Little Help from another person taking care of personal grooming?: A Lot Help from another person toileting, which includes using toliet, bedpan, or urinal?: A Lot Help from another person bathing (including washing, rinsing, drying)?: A Lot Help from another person to put on and taking off regular upper body clothing?: A Lot Help from another person to put on and taking off regular lower body clothing?: A Lot 6 Click Score: 13   End of Session     Activity Tolerance: Patient tolerated treatment well Patient left: in bed;with call bell/phone within reach;with bed alarm set;with family/visitor present;with SCD's reapplied;Other (comment) (polar care and sling in place)  OT Visit Diagnosis: Other abnormalities of gait and mobility (R26.89);History of falling (Z91.81);Pain Pain - Right/Left: Right Pain - part of body:  (eye)                Time: 8182-9937 OT Time Calculation (min): 49 min Charges:  OT General Charges $OT Visit: 1 Visit OT Evaluation $OT Eval Moderate Complexity: 1 Mod OT Treatments $Self Care/Home Management : 38-52 mins  Hanley Hays, MPH, MS, OTR/L ascom [phone] 09/02/20, 3:57 PM

## 2020-09-02 NOTE — H&P (Signed)
History of Present Illness:  Angela Fernandez is a 75 y.o. female who presents today as a result of a call to our clinic for right shoulder pain and weakness.   The patient's symptoms began 2 to 3 years ago and developed as a result of a fall at home. She was diagnosed with a large rotator cuff tear and underwent an attempted rotator cuff repair. However, shortly after surgery, she reinjured her shoulder lifting some groceries. She was told by her surgeon at the time that it could not be fixed again. Recently, she has moved into this area and is now trying to establish orthopedic care locally. The patient describes the symptoms as marked (major pain with significant limitations) and have the quality of being aching, miserable, nagging, stabbing and tender. The pain is localized to the lateral arm/shoulder and localized to the anterior shoulder. These symptoms are aggravated with normal daily activities, with sleeping, at higher levels of activity, with overhead activity and reaching behind the back. She has tried acetaminophen, non-steroidal anti-inflammatories (Mobic), narcotics and muscle relaxants with limited benefit. She has tried physical therapy , rest and ice with no significant benefit. She has not tried any steroid injections recently for these symptoms. The patient does note some intermittent neck discomfort but is unsure as to whether it is related to her shoulder. She denies any numbness or paresthesias down her arm to her hand. This complaint is not work related. She is a sports non-participant.  Shoulder Surgical History:  The patient has had a rotator cuff repair and a decompression in the past.  PMH/PSH/Family History/Social History/Meds/Allergies:  I have reviewed past medical, surgical, social and family history, medications and allergies as documented in the EMR.  Current Outpatient Medications: . acetaminophen (TYLENOL) 325 MG tablet Take by mouth  . albuterol 90 mcg/actuation inhaler  Inhale into the lungs  . amLODIPine (NORVASC) 10 MG tablet Take 10 mg by mouth  . amoxicillin (AMOXIL) 500 MG capsule Take 500 mg by mouth 2 (two) times daily 4 caps 1 hour before dental appointment.  Jearl Klinefelter ELLIPTA 62.5-25 mcg/actuation inhaler  . blood glucose diagnostic test strip 1 each (1 strip total) 3 (three) times daily Use as instructed. 100 each 11  . buPROPion (WELLBUTRIN SR) 200 MG SR tablet Take by mouth  . clonazePAM (KLONOPIN) 1 MG tablet Take by mouth Takes 1.5mg  in the am, 1mg  in the afternoon, and 1 mg in the pm for a total daily dose of 3.5mg .  . cyclobenzaprine (FLEXERIL) 10 MG tablet Take 10 mg by mouth  . desvenlafaxine 50 mg tablet, extended release Take 50 mg by mouth once daily  . desvenlafaxine succinate (PRISTIQ) 50 MG ER tablet Take by mouth  . docusate (COLACE) 100 MG capsule Take 200 mg by mouth 2 (two) times daily  . dulaglutide (TRULICITY) 4.27 CW/2.3 mL pen injector Inject subcutaneously  . fenofibrate nanocrystallized (TRICOR) 145 MG tablet Take by mouth  . ferrous sulfate 325 (65 FE) MG tablet Take 325 mg by mouth daily with breakfast  . FLOVENT DISKUS 250 mcg/actuation diskus inhaler  . fluticasone-umeclidinium-vilanterol (TRELEGY ELLIPTA) 100-62.5-25 mcg inhaler Inhale 1 inhalation into the lungs once daily  . glucagon (GLUCAGEN) 1 mg injection Inject 1 Device as directed as needed (hypoglycemia).  Marland Kitchen HUMALOG KWIKPEN INSULIN pen injector (concentration 100 units/mL)  . hydroCHLOROthiazide (HYDRODIURIL) 25 MG tablet Take 12.5 tablets by mouth once daily  . insulin ASPART (NOVOLOG) 100 unit/mL injection Inject 5 Units subcutaneously Inject 5 units with meals  if bs is over 160  . insulin GLARGINE (LANTUS SOLOSTAR U-100 INSULIN) pen injector (concentration 100 units/mL) Inject 30 Units subcutaneously once daily Patient injecting in he morning  . lactulose (ENULOSE) 10 gram/15 mL oral solution Take by mouth  . levothyroxine (SYNTHROID) 175 MCG tablet Take by  mouth  . lisinopriL (ZESTRIL) 5 MG tablet Take 5 mg by mouth once daily  . lurasidone (LATUDA) 120 mg tablet Take 1 tablet by mouth  . meloxicam (MOBIC) 7.5 MG tablet Take by mouth  . metoprolol succinate (TOPROL-XL) 100 MG XL tablet Take 100 mg by mouth  . multivitamin tablet Take 1 tablet by mouth once daily  . omeprazole (PRILOSEC) 40 MG DR capsule Take by mouth  . ondansetron (ZOFRAN) 4 MG tablet Take by mouth  . ONETOUCH DELICA PLUS LANCET  . simvastatin (ZOCOR) 20 MG tablet Take 1 tablet (20 mg total) by mouth daily.  . traMADoL (ULTRAM) 50 mg tablet Take by mouth  . traZODone (DESYREL) 50 MG tablet Take 50 mg by mouth  . zolpidem (AMBIEN) 10 mg tablet Take by mouth   Allergies: No Known Allergies  Past Medical History:  . Anemia  . Bipolar affect, depressed (CMS-HCC)  . COPD (chronic obstructive pulmonary disease) (CMS-HCC)  . Diabetes mellitus without complication (CMS-HCC)  . GERD (gastroesophageal reflux disease)  . Hyperlipidemia  . Hypertension  . PTSD (post-traumatic stress disorder)  . Schizoaffective disorder (CMS-HCC)  . Thyroid disease   Past Surgical History:  . BACK SURGERY - Fatty cyst removal  . Bilateral Hand Surgery  . Bilateral Total Knee Arthroplasty  . Breast Surgery - Cyst Removal  . CATARACT EXTRACTION W/ INTRAOCULAR LENS IMPLANT & ANTERIOR VITRECTOMY, BILATERAL  . CHOLECYSTECTOMY  . COLONOSCOPY  . Right Shoulder Surgery  . UPPER GASTROINTESTINAL ENDOSCOPY   Family History:  . Alzheimer's disease Mother  . Pancreatitis Father    Social History:   Socioeconomic History:  Marland Kitchen Marital status: Single  Spouse name: Not on file  . Number of children: Not on file  . Years of education: Not on file  . Highest education level: Not on file  Occupational History  . Not on file  Tobacco Use  . Smoking status: Former Smoker  Packs/day: 1.00  Quit date: 05/23/2017  Years since quitting: 3.1  . Smokeless tobacco: Not on file  Substance and Sexual  Activity  . Alcohol use: Not Currently  . Drug use: Not Currently  . Sexual activity: Not on file  Other Topics Concern  . Not on file  Social History Narrative  . Not on file   Social Determinants of Health:   Financial Resource Strain: Not on file  Food Insecurity: Not on file  Transportation Needs: Not on file   Review of Systems:  A comprehensive 14 point ROS was performed, reviewed, and the pertinent orthopaedic findings are documented in the HPI.  Physical Exam:  Vitals:  07/14/20 1334  BP: 128/86  Weight: 93.7 kg (206 lb 9.6 oz)  Height: 162.6 cm (5\' 4" )  PainSc: 5  PainLoc: Shoulder   General/Constitutional: The patient appears to be well-nourished, well-developed, and in no acute distress. Neuro/Psych: Normal mood and affect, oriented to person, place and time. Eyes: Non-icteric. Pupils are equal, round, and reactive to light, and exhibit synchronous movement. ENT: Unremarkable. Lymphatic: No palpable adenopathy. Respiratory: Lungs clear to auscultation, Normal chest excursion, No wheezes and Non-labored breathing Cardiovascular: Regular rate and rhythm. No murmurs. and No edema, swelling or tenderness, except as noted  in detailed exam. Integumentary: No impressive skin lesions present, except as noted in detailed exam. Musculoskeletal: Unremarkable, except as noted in detailed exam.  Right shoulder exam: SKIN: Well-healed surgical incision, otherwise unremarkable SWELLING: none WARMTH: none LYMPH NODES: no adenopathy palpable CREPITUS: Intermittent glenohumeral crepitance TENDERNESS: Mildly tender over anterior shoulder ROM (active):  Forward flexion: 65 degrees Abduction: 60 degrees Internal rotation: L3 ROM (passive):  Forward flexion: 145 degrees Abduction: 140 degrees ER/IR at 90 abd: 90 degrees / 55 degrees  She has mild pain at the extremes of all motions, and more severe pain with attempted active forward flexion or abduction.  STRENGTH: Forward  flexion: 2/5 Abduction: 2/5 External rotation: 3+-4/5 Internal rotation: 4-4+/5 Pain with RC testing: Mild pain with resisted forward flexion, abduction, and external rotation  STABILITY: Normal  SPECIAL TESTS: Luan Pulling' test: positive, mild Speed's test: Not evaluated Capsulitis - pain w/ passive ER: no Crossed arm test: Minimally positive Crank: Not evaluated Anterior apprehension: Negative Posterior apprehension: Not evaluated  Cervical Spine:  Supple, non-tender.  ROM: Flexion: 45 degrees Extension: 30 degrees Left/Right turn: 55 degrees / 55 degrees Left/Right tilt: 35 degrees / 35 degrees  She has no pain with range of motion. Neck motion does not reproduce the patient's shoulder symptoms. She has a negative Spurling's test bilaterally. She is neurovascularly intact to the right upper extremity.  Imaging:  Shoulder X-Ray Imaging: True AP, Y-scapular, and axillary views of the right shoulder are obtained. These films demonstrate moderate degenerative changes as manifest by near complete loss of the superior portion of the glenohumeral joint space. The subacromial space is markedly decreased. There is no subacromial or infra-clavicular spurring. She demonstrates a Type I-II acromion.  Assessment:  . Traumatic complete tear of right rotator cuff.  . Status post right rotator cuff repair.  . Rotator cuff tendinitis, right.  . Rotator cuff arthropathy, right.   Plan:  The treatment options were discussed with the patient and her granddaughter. In addition, patient educational materials were provided regarding the diagnosis and treatment options. The patient is quite frustrated by her symptoms and functional limitations, and is ready to consider more aggressive treatment options. Therefore, I have recommended a surgical procedure, specifically a reverse right total shoulder arthroplasty. The procedure was discussed with the patient, as were the potential risks (including  bleeding, infection, nerve and/or blood vessel injury, persistent or recurrent pain, loosening and/or failure of the components, dislocation, need for further surgery, blood clots, strokes, heart attacks and/or arhythmias, pneumonia, etc.) and benefits. The patient states her understanding and wishes to proceed. All of the patient's questions and concerns were answered. She can call any time with further concerns. She will follow up post-surgery, routine.   H&P reviewed and patient re-examined. No changes.

## 2020-09-02 NOTE — Anesthesia Procedure Notes (Cosign Needed)
Procedure Name: Intubation Date/Time: 09/02/2020 7:45 AM Performed by: Alvin Critchley, MD Pre-anesthesia Checklist: Patient identified, Emergency Drugs available, Suction available and Patient being monitored Patient Re-evaluated:Patient Re-evaluated prior to induction Oxygen Delivery Method: Circle system utilized Preoxygenation: Pre-oxygenation with 100% oxygen Induction Type: IV induction Ventilation: Mask ventilation without difficulty Laryngoscope Size: Mac and 3 Grade View: Grade I Tube type: Oral Number of attempts: 1 Airway Equipment and Method: Stylet and Oral airway Placement Confirmation: ETT inserted through vocal cords under direct vision,  positive ETCO2 and breath sounds checked- equal and bilateral Secured at: 21 cm Tube secured with: Tape Dental Injury: Teeth and Oropharynx as per pre-operative assessment

## 2020-09-02 NOTE — Anesthesia Postprocedure Evaluation (Signed)
Anesthesia Post Note  Patient: Tane Biegler  Procedure(s) Performed: REVERSE SHOULDER ARTHROPLASTY (Right Shoulder)  Patient location during evaluation: PACU Anesthesia Type: General Level of consciousness: awake and alert Pain management: pain level controlled Vital Signs Assessment: post-procedure vital signs reviewed and stable Respiratory status: spontaneous breathing, nonlabored ventilation, respiratory function stable and patient connected to nasal cannula oxygen Cardiovascular status: blood pressure returned to baseline and stable Postop Assessment: no apparent nausea or vomiting Anesthetic complications: yes (right sided corneal abrasion) Comments:  Right eye erythematous and tearing, pt reports "feels like something is in it."  Saline drops, Poly-trim and Toradol eye drops ordered per corneal abrasion order set.  Pt advised that if the eye is not improving or worsening to call back for referral to ophthalmology.    No complications documented.   Last Vitals:  Vitals:   09/02/20 1047 09/02/20 1100  BP: 128/62 (!) 119/91  Pulse:  68  Resp: 18 16  Temp: 36.9 C   SpO2: 98% 97%    Last Pain:  Vitals:   09/02/20 1100  TempSrc:   PainSc: 0-No pain                 Brett Canales Deionte Spivack

## 2020-09-02 NOTE — Op Note (Signed)
09/02/2020  10:47 AM  Patient:   Angela Fernandez  Pre-Op Diagnosis:   Massive irreparable rotator cuff tear with advanced cuff arthropathy, right shoulder.  Post-Op Diagnosis:   Same  Procedure:   Reverse right total shoulder arthroplasty.  Surgeon:   Pascal Lux, MD  Assistant:   Cameron Proud, PA-C; Marijean Bravo, PA-S  Anesthesia:   General endotracheal with an interscalene block using Sensorcaine placed preoperatively by the anesthesiologist.  Findings:   As above.  Complications:   None  EBL:   100 cc  Fluids:   800 cc crystalloid  UOP:   None  TT:   None  Drains:   None  Closure:   Staples  Implants:   All press-fit Biomet Comprehensive system with a #7 micro-humeral stem, a +6 mm offset 40 mm humeral tray with a standard insert, and a medium augmented mini-base plate with a 36 mm glenosphere.  Brief Clinical Note:   The patient is a 75 year old female with a long history of progressively worsening right shoulder pain and weakness. Her symptoms have progressed despite medications, activity modification, etc. Her history and examination are consistent with advanced rotator cuff arthropathy secondary to a massive irreparable rotator cuff tear following a failed rotator cuff repair. These findings were confirmed by preoperative MRI scanning. The patient presents at this time for a reverse right total shoulder arthroplasty.  Procedure:   The patient underwent placement of an interscalene block using Sensorcaine by the anesthesiologist in the preoperative holding area before being brought into the operating room and lain in the supine position. The patient then underwent general endotracheal intubation and anesthesia before the patient was repositioned in the beach chair position using the beach chair positioner. The right shoulder and upper extremity were prepped with ChloraPrep solution before being draped sterilely. Preoperative antibiotics were administered.   A standard  anterior approach to the shoulder was made through an approximately 4-5 inch incision. The incision was carried down through the subcutaneous tissues to expose the deltopectoral fascia. The interval between the deltoid and pectoralis muscles was identified and this plane developed, retracting the cephalic vein laterally with the deltoid muscle. The conjoined tendon was identified. Its lateral margin was dissected and the Kolbel self-retraining retractor inserted. The "three sisters" were identified and cauterized. Bursal tissues were removed to improve visualization. The subscapularis tendon was released from its attachment to the lesser tuberosity 1 cm proximal to its insertion and several tagging sutures placed. The inferior capsule was released with care after identifying and protecting the axillary nerve. The proximal humeral cut was made at approximately 25 of retroversion using the extra-medullary guide.   Attention was redirected to the glenoid. The labrum was debrided circumferentially before the center of the glenoid was marked with electrocautery. The guidewire was drilled into the glenoid neck using the appropriate guide, a medium augmented guide. Given the shallowness of her glenoid and the superior inclination of the glenoid due to posterior superior wear, it was felt best to proceed with an augmented baseplate. After verifying its position, the guidewire was overreamed with the mini-baseplate reamer to create a flat surface over the inferior 50% of the glenoid. The appropriate preparations were then made for reaming the superior portion of the glenoid using the medium augmented guide system. Once this was completed, the adequacy of reaming was verified using the appropriate trial guide. The permanent mini-baseplate was then impacted into place. It was stabilized with a 25 x 6.5 mm central screw and four peripheral locking  screws. The permanent 36 mm glenosphere was then impacted into place and its  Morse taper locking mechanism verified using manual distraction.  Attention was directed to the humeral side. The humeral canal was reamed sequentially beginning with the end-cutting reamer then progressing from a 4 mm reamer up to an 8 mm reamer. This provided excellent circumferential chatter. The canal was broached beginning with a #6 broach and progressing to a #8 broach. This was left in place and a trial reduction performed using the standard and +6 mm offset trial humeral platforms. Even with the +6 mm offset trial platform, the shoulder was too tight. Therefore, the trial stem was removed and an additional 4 to 5 mm of bone resected off the proximal humerus. A repeat trial reduction with was performed using the #7 stem with the +6 mm offset trial platform. The arm demonstrated excellent range of motion as the hand could be brought across the chest to the opposite shoulder and brought to the top of the patient's head and to the patient's ear. The shoulder appeared stable throughout this range of motion.   The joint was dislocated and the trial components removed. The permanent #7 micro-stem was impacted into place with care taken to maintain the appropriate version. The permanent +6 mm offset 40 mm humeral platform with the standard insert was put together on the back table and impacted into place. Again, the [facility] taper locking mechanism was verified using manual distraction. The shoulder was relocated using two finger pressure and again placed through a range of motion with the findings as described above.  The wound was copiously irrigated with sterile saline solution using the jet lavage system before a total of 20 cc of Exparel diluted out to 60 cc with normal saline and 30 cc of 0.5% Sensorcaine with epinephrine was injected into the pericapsular and peri-incisional tissues to help with postoperative analgesia. The subscapularis tendon was reapproximated using #2 FiberWire interrupted sutures. The  deltopectoral interval was closed using #0 Vicryl interrupted sutures before the subcutaneous tissues were closed using 2-0 Vicryl interrupted sutures. The skin was closed using staples. Prior to closing the skin, 1 g of transexemic acid in 10 cc of normal saline was injected intra-articularly to help with postoperative bleeding. A sterile occlusive dressing was applied to the wound before the arm was placed into a shoulder immobilizer with an abduction pillow. A Polar Care system also was applied to the shoulder. The patient was then transferred back to a hospital bed before being awakened, extubated, and returned to the recovery room in satisfactory condition after tolerating the procedure well.

## 2020-09-02 NOTE — Transfer of Care (Signed)
Immediate Anesthesia Transfer of Care Note  Patient: Angela Fernandez  Procedure(s) Performed: REVERSE SHOULDER ARTHROPLASTY (Right Shoulder)  Patient Location: PACU  Anesthesia Type:General  Level of Consciousness: awake and alert   Airway & Oxygen Therapy: Patient Spontanous Breathing and Patient connected to face mask oxygen  Post-op Assessment: Report given to RN and Post -op Vital signs reviewed and stable  Post vital signs: Reviewed and stable  Last Vitals:  Vitals Value Taken Time  BP 128/62 09/02/20 1047  Temp 36.9 C 09/02/20 1047  Pulse 66 09/02/20 1050  Resp 17 09/02/20 1050  SpO2 98 % 09/02/20 1050  Vitals shown include unvalidated device data.  Last Pain:  Vitals:   09/02/20 0736  TempSrc:   PainSc: 5          Complications: No complications documented.

## 2020-09-02 NOTE — Evaluation (Signed)
Physical Therapy Evaluation Patient Details Name: Angela Fernandez MRN: [id number] DOB: 1945-10-17 Today's Date: 09/02/2020   History of Present Illness  Patient is a 75 year old female with traumatic complete tear of right rotator cuff with shoulder pain and weakness. History of previous right TSA. s/p reverse right TSA on 09/02/20. PMHx includes COPD, anxiety, depression, bipolar disorder, GERD, HTN, anemia, DMII, HLD, PTSD, and schizoaffective disorder, and TIA (2010). Patient had anesthetic complication of right sided corneal abrasion per anesthesia note.   Clinical Impression  Patient agreeable to PT evaluation and mobilizing following shoulder surgery. RUE sling in place during session. Patient complains most of 8/10 right eye pain (complication during surgery per notes) and blurred vision in that eye. Patient is ambulatory with cane for long distance at baseline, otherwise ambulates without assistive device and independent with activity.  Patient was able to maintain NWB of RUE with all functional mobility after initial education. Two bouts of ambulation performed with hand held assistance the first time and Min guard/no UE support on the second bout of walking. Patient needs occasional cues with ambulation for increased awareness of RUE/sling and to avoid bumping into objects for safety and protection of right shoulder. Patient reports her legs feel generally weak with ambulation. Sp02 88% on room air with ambulation and increased to 91% with short rest break. Recommend to continue PT maximize independence and safety in preparation for discharge home with family support.      Follow Up Recommendations Home health PT;Supervision for mobility/OOB    Equipment Recommendations  3in1 (PT)    Recommendations for Other Services       Precautions / Restrictions Precautions Precautions: Fall;Shoulder Shoulder Interventions: Shoulder sling/immobilizer;At all times Required Braces or Orthoses:  Sling Restrictions Weight Bearing Restrictions: Yes RUE Weight Bearing: Non weight bearing      Mobility  Bed Mobility Overal bed mobility: Needs Assistance Bed Mobility: Supine to Sit;Sit to Supine     Supine to sit: Supervision;HOB elevated Sit to supine: Min assist;HOB elevated   General bed mobility comments: assistance for LE support to return to  bed. cues for safety and to maintain NWB of RUE with functional activity. patient able to maintain weight bearing status witout difficulty with sling in place    Transfers Overall transfer level: Needs assistance Equipment used: None Transfers: Sit to/from Stand Sit to Stand: Min guard         General transfer comment: Min guard for safety. 2 bouts of standing performed. patient able to maintain NWB of RUE with functional mobility  Ambulation/Gait Ambulation/Gait assistance: Min guard Gait Distance (Feet):  (57ft and then 25 ft) Assistive device: 1 person hand held assist;None Gait Pattern/deviations: Narrow base of support Gait velocity: decreased   General Gait Details: Patient ambulated twice with cues for safety and for increased awareness of RUE/sling and to avoid hitting objects with right arm while walking. Patient required HHA with first walk and Min guard assistance with second walking bout. Patient reports her legs feel weak. Did not progress ambulation further at this time. Sp02 88% initially after walking and increased quickly to 91% after seated rest break on room air.  Stairs            Wheelchair Mobility    Modified Rankin (Stroke Patients Only)       Balance Overall balance assessment: Needs assistance;History of Falls Sitting-balance support: Feet supported Sitting balance-Leahy Scale: Good     Standing balance support: No upper extremity supported Standing balance-Leahy Scale: Fair  Pertinent Vitals/Pain Pain Assessment: 0-10 Pain Score: 8   Pain Location: right eye Pain Descriptors / Indicators: Burning Pain Intervention(s): Monitored during session (polar care re-applied to right shoulder at end of session)    Home Living Family/patient expects to be discharged to:: Private residence Living Arrangements: Children (daughter and other family members) Available Help at Discharge: Family;Available PRN/intermittently Type of Home: House Home Access: Stairs to enter Entrance Stairs-Rails: Can reach both Entrance Stairs-Number of Steps: 4 Home Layout: Two level;Bed/bath upstairs Home Equipment: Cane - single point      Prior Function Level of Independence: Needs assistance;Independent with assistive device(s)         Comments: Patient needs a single point cane for ambulation outside the home. no device used inside the house. There was a fall that occured at the daughter's home where the patient missed a step on the stairs.     Hand Dominance   Dominant Hand: Right    Extremity/Trunk Assessment   Upper Extremity Assessment Upper Extremity Assessment: RUE deficits/detail RUE Deficits / Details: impaired RUE sensation. patient reports thumb is still numb. Patient able to activate hand movement. Did not assess elbow or shoulder ROM as sling in place RUE: Unable to fully assess due to immobilization    Lower Extremity Assessment Lower Extremity Assessment: RLE deficits/detail;LLE deficits/detail RLE Deficits / Details: 5/5 strength knee extension, ankle dorsiflexion and plantarflexion. RLE Sensation: WNL LLE Deficits / Details: 5/5 strength knee extension, ankle dorsiflexion and plantarflexion. LLE Sensation: WNL       Communication      Cognition Arousal/Alertness: Awake/alert Behavior During Therapy: WFL for tasks assessed/performed Overall Cognitive Status: Within Functional Limits for tasks assessed                                        General Comments General comments (skin integrity,  edema, etc.): patient does complain of right eye pain and blurry vision. nurse brought eye drops during session and patient reports this helps with the pain    Exercises     Assessment/Plan    PT Assessment Patient needs continued PT services  PT Problem List Decreased strength;Decreased range of motion;Decreased mobility;Decreased balance;Decreased safety awareness;Decreased knowledge of use of DME;Decreased knowledge of precautions       PT Treatment Interventions DME instruction;Gait training;Stair training;Therapeutic activities;Functional mobility training;Therapeutic exercise;Balance training;Neuromuscular re-education;Patient/family education    PT Goals (Current goals can be found in the Care Plan section)  Acute Rehab PT Goals Patient Stated Goal: to have less eye pain PT Goal Formulation: With patient Time For Goal Achievement: 09/16/20 Potential to Achieve Goals: Good    Frequency 7X/week   Barriers to discharge        Co-evaluation               AM-PAC PT "6 Clicks" Mobility  Outcome Measure Help needed turning from your back to your side while in a flat bed without using bedrails?: None Help needed moving from lying on your back to sitting on the side of a flat bed without using bedrails?: A Little Help needed moving to and from a bed to a chair (including a wheelchair)?: A Little Help needed standing up from a chair using your arms (e.g., wheelchair or bedside chair)?: A Little Help needed to walk in hospital room?: A Little Help needed climbing 3-5 steps with a railing? : A Little 6 Click Score: 19  End of Session Equipment Utilized During Treatment: Oxygen (oxygen removed for mobility and replaced at end of session per family request and Sp02 91%) Activity Tolerance: Patient tolerated treatment well Patient left: in bed;with call bell/phone within reach;with bed alarm set;with SCD's reapplied;with family/visitor present (polar care re-applied) Nurse  Communication: Mobility status PT Visit Diagnosis: Unsteadiness on feet (R26.81);Muscle weakness (generalized) (M62.81);History of falling (Z91.81)    Time: 5176-1607 PT Time Calculation (min) (ACUTE ONLY): 26 min   Charges:   PT Evaluation $PT Eval Low Complexity: 1 Low PT Treatments $Therapeutic Activity: 8-22 mins        Minna Merritts, PT, MPT   Percell Locus 09/02/2020, 2:41 PM

## 2020-09-02 NOTE — Anesthesia Preprocedure Evaluation (Addendum)
Anesthesia Evaluation  Patient identified by MRN, date of birth, ID band Patient awake    Reviewed: Allergy & Precautions, NPO status , Patient's Chart, lab work & pertinent test results  Airway Mallampati: III  TM Distance: <3 FB     Dental  (+) Chipped   Pulmonary COPD, former smoker,    Pulmonary exam normal        Cardiovascular hypertension, Normal cardiovascular exam     Neuro/Psych PSYCHIATRIC DISORDERS Anxiety Depression Bipolar Disorder Schizophrenia TIA   GI/Hepatic Neg liver ROS, GERD  ,  Endo/Other  diabetesHypothyroidism   Renal/GU negative Renal ROS     Musculoskeletal   Abdominal Normal abdominal exam  (+)   Peds negative pediatric ROS (+)  Hematology  (+) anemia ,   Anesthesia Other Findings Past Medical History: No date: Anemia No date: Bipolar affect, depressed (HCC) No date: COPD (chronic obstructive pulmonary disease) (HCC) No date: Diabetes mellitus without complication (HCC)     Comment:  type II No date: GERD (gastroesophageal reflux disease) No date: Hyperlipidemia No date: Hypertension No date: Hypothyroidism No date: PTSD (post-traumatic stress disorder) No date: Schizoaffective disorder ([place]) 2010: TIA (transient ischemic attack)  Reproductive/Obstetrics                            Anesthesia Physical Anesthesia Plan  ASA: III  Anesthesia Plan: General   Post-op Pain Management:  Regional for Post-op pain   Induction: Intravenous  PONV Risk Score and Plan:   Airway Management Planned: Oral ETT  Additional Equipment:   Intra-op Plan:   Post-operative Plan: Extubation in OR  Informed Consent: I have reviewed the patients History and Physical, chart, labs and discussed the procedure including the risks, benefits and alternatives for the proposed anesthesia with the patient or authorized representative who has indicated his/her understanding and  acceptance.     Dental advisory given  Plan Discussed with: CRNA and Surgeon  Anesthesia Plan Comments: (Patient and surgeon request interscalene block for postop pain control.  The risks were explained to the patient including difficulty breathing, pneumothorax, cardiac arrest, seizures, nerve damage, block not working, infection.  Patient understands and accepts risks and wishes to proceed with the block.)       Anesthesia Quick Evaluation

## 2020-09-02 NOTE — Anesthesia Procedure Notes (Signed)
Anesthesia Regional Block: Interscalene brachial plexus block   Pre-Anesthetic Checklist: ,, timeout performed, Correct Patient, Correct Site, Correct Laterality, Correct Procedure, Correct Position, site marked, Risks and benefits discussed,  Surgical consent,  Pre-op evaluation,  At surgeon's request and post-op pain management  Laterality: Right  Prep: chloraprep, alcohol swabs       Needles:  Injection technique: Single-shot  Needle Type: Stimiplex     Needle Length: 5cm  Needle Gauge: 22     Additional Needles:   Procedures:, nerve stimulator,,, ultrasound used (permanent image in chart),,,,   Nerve Stimulator or Paresthesia:  Response: biceps flexion, 0.6 mA,   Additional Responses:   Narrative:  Start time: 09/02/2020 7:28 AM End time: 09/02/2020 7:35 AM Injection made incrementally with aspirations every 5 mL.  Performed by: Personally  Anesthesiologist: Alvin Critchley, MD  Additional Notes: Functioning IV was confirmed and monitors were applied.  A 44mm 22ga Stimuplex needle was used. Sterile prep and drape,hand hygiene and sterile gloves were used.  Negative aspiration and negative test dose prior to incremental administration of local anesthetic. The patient tolerated the procedure well.  No pain on injection and easy injection throughout with good spread.

## 2020-09-03 ENCOUNTER — Encounter: Payer: Self-pay | Admitting: Surgery

## 2020-09-03 LAB — CBC
HCT: 30.6 % — ABNORMAL LOW (ref 36.0–46.0)
Hemoglobin: 9.9 g/dL — ABNORMAL LOW (ref 12.0–15.0)
MCH: 31.3 pg (ref 26.0–34.0)
MCHC: 32.4 g/dL (ref 30.0–36.0)
MCV: 96.8 fL (ref 80.0–100.0)
Platelets: 245 10*3/uL (ref 150–400)
RBC: 3.16 MIL/uL — ABNORMAL LOW (ref 3.87–5.11)
RDW: 13.1 % (ref 11.5–15.5)
WBC: 11.7 10*3/uL — ABNORMAL HIGH (ref 4.0–10.5)
nRBC: 0 % (ref 0.0–0.2)

## 2020-09-03 LAB — GLUCOSE, CAPILLARY
Glucose-Capillary: 88 mg/dL (ref 70–99)
Glucose-Capillary: 199 mg/dL — ABNORMAL HIGH (ref 70–99)
Glucose-Capillary: 103 mg/dL — ABNORMAL HIGH (ref 70–99)

## 2020-09-03 LAB — BASIC METABOLIC PANEL
Anion gap: 10 (ref 5–15)
BUN: 39 mg/dL — ABNORMAL HIGH (ref 8–23)
CO2: 24 mmol/L (ref 22–32)
Calcium: 8.7 mg/dL — ABNORMAL LOW (ref 8.9–10.3)
Chloride: 103 mmol/L (ref 98–111)
Creatinine, Ser: 1.13 mg/dL — ABNORMAL HIGH (ref 0.44–1.00)
GFR, Estimated: 51 mL/min — ABNORMAL LOW (ref >60)
Glucose, Bld: 102 mg/dL — ABNORMAL HIGH (ref 70–99)
Potassium: 3.6 mmol/L (ref 3.5–5.1)
Sodium: 137 mmol/L (ref 135–145)

## 2020-09-03 NOTE — Progress Notes (Signed)
Physical Therapy Treatment Patient Details Name: Angela Fernandez MRN: [id number] DOB: 17-Nov-1945 Today's Date: 09/03/2020    History of Present Illness Patient is a 75 year old female with traumatic complete tear of right rotator cuff with shoulder pain and weakness. History of previous right TSA. s/p reverse right TSA on 09/02/20. PMHx includes COPD, anxiety, depression, bipolar disorder, GERD, HTN, anemia, DMII, HLD, PTSD, and schizoaffective disorder, and TIA (2010). Patient had anesthetic complication of right sided corneal abrasion per anesthesia note.    PT Comments    Pt fatigued after sitting up in chair for several hours. Appears less fatigued and lethargic then previous day. Pt able to raise to standing without difficulty with good initial standing balance and wide BOS.  Gait training in room with single point cane and CG/SBA for safety.  Pt able to safely maneuver around objects and change directions without LOB.  Reaching tasks out of BOS with single UE support and SBA.  Sitting EOB for lunch with assistance for set up.  Pt returned to bed with supervision and assist to support R UE and reduce pain.  Pt stated her daughter will be available on 4/29 to assist her at home. Continue to recommend HHPT   Follow Up Recommendations  Home health PT;Supervision for mobility/OOB     Equipment Recommendations  3in1 (PT)    Recommendations for Other Services       Precautions / Restrictions Precautions Precautions: Fall;Shoulder Shoulder Interventions: Shoulder sling/immobilizer;At all times;Shoulder abduction pillow;Off for dressing/bathing/exercises Precaution Booklet Issued: Yes (comment) Required Braces or Orthoses: Sling Restrictions Weight Bearing Restrictions: Yes RUE Weight Bearing: Non weight bearing    Mobility  Bed Mobility Overal bed mobility: Needs Assistance Bed Mobility: Supine to Sit;Sit to Supine     Supine to sit: Supervision;HOB elevated Sit to supine: Min  guard        Transfers Overall transfer level: Needs assistance Equipment used: None Transfers: Sit to/from Stand Sit to Stand: Min guard            Ambulation/Gait Ambulation/Gait assistance: Min guard Gait Distance (Feet): 40 Feet Assistive device: Straight cane Gait Pattern/deviations: Narrow base of support Gait velocity: decreased   General Gait Details: Pt ambulated in room, to/from [facility] without LOB   Stairs             Wheelchair Mobility    Modified Rankin (Stroke Patients Only)       Balance                                            Cognition Arousal/Alertness: Awake/alert Behavior During Therapy: WFL for tasks assessed/performed Overall Cognitive Status: Within Functional Limits for tasks assessed                                 General Comments: Much more awake and alert this session      Exercises      General Comments General comments (skin integrity, edema, etc.): Pt required assistance for set up at lunch      Pertinent Vitals/Pain Pain Assessment: 0-10 Pain Score: 7  Pain Location: Right UE Pain Descriptors / Indicators: Burning;Discomfort Pain Intervention(s): Monitored during session;Repositioned    Home Living  Prior Function            PT Goals (current goals can now be found in the care plan section) Acute Rehab PT Goals Patient Stated Goal: get better and go home Progress towards PT goals: Progressing toward goals (Will need to practice stairs prior to possible d/c on Friday)    Frequency    7X/week      PT Plan      Co-evaluation              AM-PAC PT "6 Clicks" Mobility   Outcome Measure  Help needed turning from your back to your side while in a flat bed without using bedrails?: None Help needed moving from lying on your back to sitting on the side of a flat bed without using bedrails?: A Little Help needed moving to and from a  bed to a chair (including a wheelchair)?: A Little Help needed standing up from a chair using your arms (e.g., wheelchair or bedside chair)?: A Little Help needed to walk in hospital room?: A Little Help needed climbing 3-5 steps with a railing? : A Little 6 Click Score: 19    End of Session   Activity Tolerance: Patient tolerated treatment well Patient left: in bed;with call bell/phone within reach;with bed alarm set;with SCD's reapplied;with family/visitor present Nurse Communication: Mobility status PT Visit Diagnosis: Unsteadiness on feet (R26.81);Muscle weakness (generalized) (M62.81);History of falling (Z91.81)     Time: 1120-1200 PT Time Calculation (min) (ACUTE ONLY): 40 min  Charges:  $Gait Training: 8-22 mins $Therapeutic Activity: 23-37 mins                    Mikel Cella, PTA  [name] 09/03/2020, 3:16 PM

## 2020-09-03 NOTE — TOC Progression Note (Signed)
Transition of Care [facility]) - Progression Note    Patient Details  Name: Angela Fernandez MRN: [id number] Date of Birth: 24-Oct-1945  Transition of Care [facility]) CM/SW [place], RN Phone Number: 09/03/2020, 3:37 PM  Clinical Narrative:   Met with the patient at the bedside She will be going home with her daughter however her daughter is not available until Friday, she stated that she has a cane at home and does not need additional DME, She is set up with [facility] services, she has transportation to and from the Doctor and can afford her medications, will continue to monitor for needs until DC     Expected Discharge Plan: (P) [place] Barriers to Discharge: (P) Continued Medical Work up,Other (comment) (Family not available to help until Friday)  Expected Discharge Plan and Services Expected Discharge Plan: (P) [place]       Living arrangements for the past 2 months: (P) Single Family Home                                       Social Determinants of Health (SDOH) Interventions    Readmission Risk Interventions No flowsheet data found.

## 2020-09-03 NOTE — Progress Notes (Signed)
  Subjective: 1 Day Post-Op Procedure(s) (LRB): REVERSE SHOULDER ARTHROPLASTY (Right) Patient reports pain as moderate in the right shoulder, just received oxycodone prior to me entering the room.   Patient is well, and has had no acute complaints or problems PT and care management to assist with discharge planning. Negative for chest pain and shortness of breath Fever: no Gastrointestinal:Negative for nausea and vomiting this AM.  Objective: Vital signs in last 24 hours: Temp:  [97.6 F (36.4 C)-98.4 F (36.9 C)] 97.6 F (36.4 C) (04/27 0809) Pulse Rate:  [67-70] 68 (04/27 0809) Resp:  [14-20] 16 (04/27 0809) BP: (110-150)/(42-94) 150/71 (04/27 0809) SpO2:  [93 %-100 %] 100 % (04/27 0809)  Intake/Output from previous day:  Intake/Output Summary (Last 24 hours) at 09/03/2020 0940 Last data filed at 09/02/2020 1853 Gross per 24 hour  Intake 850.03 ml  Output 100 ml  Net 750.03 ml    Intake/Output this shift: No intake/output data recorded.  Labs: Recent Labs    09/03/20 0437  HGB 9.9*   Recent Labs    09/03/20 0437  WBC 11.7*  RBC 3.16*  HCT 30.6*  PLT 245   Recent Labs    09/03/20 0437  NA 137  K 3.6  CL 103  CO2 24  BUN 39*  CREATININE 1.13*  GLUCOSE 102*  CALCIUM 8.7*   No results for input(s): LABPT, INR in the last 72 hours.   EXAM General - Patient is Alert, Appropriate and Oriented Extremity - ABD soft Incision: dressing C/D/I  Patient is intact to light touch to the right arm.  Able to flex and extend the wrist without pain. Able to flex and extend her fingers without pain and make a full fist. Dressing/Incision - clean, dry, no drainage, honeycomb dressing without any active drainage. Motor Function - intact, moving foot and toes well on exam.  Abdomen soft with intact bowel sounds.  Past Medical History:  Diagnosis Date  . Anemia   . Bipolar affect, depressed ([place])   . COPD (chronic obstructive pulmonary disease) ([place])   . Diabetes  mellitus without complication ([place])    type II  . GERD (gastroesophageal reflux disease)   . Hyperlipidemia   . Hypertension   . Hypothyroidism   . PTSD (post-traumatic stress disorder)   . Schizoaffective disorder ([place])   . TIA (transient ischemic attack) 2010    Assessment/Plan: 1 Day Post-Op Procedure(s) (LRB): REVERSE SHOULDER ARTHROPLASTY (Right) Active Problems:   Status post reverse arthroplasty of shoulder, right  Estimated body mass index is 33.79 kg/m as calculated from the following:   Height as of this encounter: 5' 4.5" (1.638 m).   Weight as of this encounter: 90.7 kg. Advance diet Up with therapy D/C IV fluids when tolerating po intake.  Labs reviewed this AM. WBC 11.7, Hg 9.9 this AM.  CBC and BMP ordered for tomorrow morning. Patient with sleep apnea, monitor O2 Sats today. Up with therapy today. Will continue to monitor progress, plan is for discharge home with HHPT.  DVT Prophylaxis - Lovenox, TED hose and SCDs Non-weightbearing to the right arm.  Raquel Brazen Domangue, PA-C [facility] Orthopaedic Surgery 09/03/2020, 9:40 AM

## 2020-09-03 NOTE — Progress Notes (Signed)
Occupational Therapy Treatment Patient Details Name: Angela Fernandez MRN: [id number] DOB: August 04, 1945 Today's Date: 09/03/2020    History of present illness Patient is a 75 year old female with traumatic complete tear of right rotator cuff with shoulder pain and weakness. History of previous right TSA. s/p reverse right TSA on 09/02/20. PMHx includes COPD, anxiety, depression, bipolar disorder, GERD, HTN, anemia, DMII, HLD, PTSD, and schizoaffective disorder, and TIA (2010). Patient had anesthetic complication of right sided corneal abrasion per anesthesia note.   OT comments  Pt seen for OT treatment this date to f/u re: safety with ADLs/ADL mobility. Pt requires MOD A for seated UB bathing/dressing and education re: modified technique including see-saw to bathe/dry underarms. In addition, OT provides re-education re: polar care mgt, sling mgt, compression stocking mgt, shld dangle for ADLs. Pt with good understanding, but somewhat groggy and endorses feeling she will need further education. Pt given handout as well for f/u education. Will continue to follow in acute setting and continue to recommend [place]. Pt left in chair with chair alarm and all needs met and in reach.    Follow Up Recommendations  Home health OT    Equipment Recommendations  Other (comment) (grab bars)    Recommendations for Other Services      Precautions / Restrictions Precautions Precautions: Fall;Shoulder Shoulder Interventions: Shoulder sling/immobilizer;At all times;Shoulder abduction pillow;Off for dressing/bathing/exercises Precaution Booklet Issued: Yes (comment) Required Braces or Orthoses: Sling Restrictions Weight Bearing Restrictions: Yes RUE Weight Bearing: Non weight bearing       Mobility Bed Mobility Overal bed mobility: Needs Assistance Bed Mobility: Supine to Sit     Supine to sit: Supervision;HOB elevated Sit to supine: Min guard   General bed mobility comments: increased time     Transfers Overall transfer level: Needs assistance Equipment used: 1 person hand held assist Transfers: Sit to/from Stand Sit to Stand: Min guard         General transfer comment: increased time    Balance Overall balance assessment: Needs assistance;History of Falls Sitting-balance support: Feet supported Sitting balance-Leahy Scale: Good     Standing balance support: No upper extremity supported Standing balance-Leahy Scale: Fair                             ADL either performed or assessed with clinical judgement   ADL Overall ADL's : Needs assistance/impaired     Grooming: Minimal assistance;Sitting   Upper Body Bathing: Moderate assistance;Sitting Upper Body Bathing Details (indicate cue type and reason): cues for see-saw method Lower Body Bathing: Moderate assistance;Sitting/lateral leans Lower Body Bathing Details (indicate cue type and reason): seated EOB Upper Body Dressing : Moderate assistance;Sitting   Lower Body Dressing: Maximal assistance;Sitting/lateral leans Lower Body Dressing Details (indicate cue type and reason): increased time and assist to thread socks, ed re: one-hand technique Toilet Transfer: Min guard;Stand-pivot   Toileting- Clothing Manipulation and Hygiene: Min guard;Sit to/from stand       Functional mobility during ADLs: Min guard (1p HHA from EOB to chair ~15' away on opposite side of room)       Vision Baseline Vision/History: Wears glasses Wears Glasses: Reading only Patient Visual Report: Blurring of vision     Perception     Praxis      Cognition Arousal/Alertness: Awake/alert Behavior During Therapy: WFL for tasks assessed/performed Overall Cognitive Status: Within Functional Limits for tasks assessed  General Comments: able to follow all commands        Exercises Other Exercises Other Exercises: Pt instructed in compression stocking mgt, polar care mgt,  see-saw UB bathing method.   Shoulder Instructions       General Comments Pt required assistance for set up at lunch    Pertinent Vitals/ Pain       Pain Assessment: 0-10 Pain Score: 5  Pain Location: Right UE Pain Descriptors / Indicators: Burning;Discomfort Pain Intervention(s): Limited activity within patient's tolerance;Monitored during session;Repositioned  Home Living                                          Prior Functioning/Environment              Frequency  Min 2X/week        Progress Toward Goals  OT Goals(current goals can now be found in the care plan section)  Progress towards OT goals: Progressing toward goals  Acute Rehab OT Goals Patient Stated Goal: get better and go home OT Goal Formulation: With patient/family Time For Goal Achievement: 09/16/20 Potential to Achieve Goals: Good  Plan Discharge plan remains appropriate    Co-evaluation                 AM-PAC OT "6 Clicks" Daily Activity     Outcome Measure   Help from another person eating meals?: A Little Help from another person taking care of personal grooming?: A Lot Help from another person toileting, which includes using toliet, bedpan, or urinal?: A Lot Help from another person bathing (including washing, rinsing, drying)?: A Lot Help from another person to put on and taking off regular upper body clothing?: A Lot Help from another person to put on and taking off regular lower body clothing?: A Lot 6 Click Score: 13    End of Session Equipment Utilized During Treatment: Rolling walker  OT Visit Diagnosis: Other abnormalities of gait and mobility (R26.89);History of falling (Z91.81);Pain Pain - Right/Left: Right Pain - part of body: Shoulder   Activity Tolerance Patient tolerated treatment well   Patient Left in bed;with call bell/phone within reach;with bed alarm set;with family/visitor present;with SCD's reapplied;Other (comment)   Nurse  Communication          Time: 1916-6060 OT Time Calculation (min): 54 min  Charges: OT General Charges $OT Visit: 1 Visit OT Treatments $Self Care/Home Management : 23-37 mins $Therapeutic Activity: 23-37 mins  Gerrianne Scale, MS, OTR/L ascom [phone] 09/03/20, 5:08 PM

## 2020-09-04 LAB — CBC
HCT: 33 % — ABNORMAL LOW (ref 36.0–46.0)
Hemoglobin: 10.7 g/dL — ABNORMAL LOW (ref 12.0–15.0)
MCH: 31.5 pg (ref 26.0–34.0)
MCHC: 32.4 g/dL (ref 30.0–36.0)
MCV: 97.1 fL (ref 80.0–100.0)
Platelets: 269 10*3/uL (ref 150–400)
RBC: 3.4 MIL/uL — ABNORMAL LOW (ref 3.87–5.11)
RDW: 13 % (ref 11.5–15.5)
WBC: 13.7 10*3/uL — ABNORMAL HIGH (ref 4.0–10.5)
nRBC: 0 % (ref 0.0–0.2)

## 2020-09-04 LAB — BASIC METABOLIC PANEL
Anion gap: 9 (ref 5–15)
BUN: 17 mg/dL (ref 8–23)
CO2: 27 mmol/L (ref 22–32)
Calcium: 9.1 mg/dL (ref 8.9–10.3)
Chloride: 101 mmol/L (ref 98–111)
Creatinine, Ser: 0.67 mg/dL (ref 0.44–1.00)
GFR, Estimated: >60 mL/min (ref >60)
Glucose, Bld: 111 mg/dL — ABNORMAL HIGH (ref 70–99)
Potassium: 4 mmol/L (ref 3.5–5.1)
Sodium: 137 mmol/L (ref 135–145)

## 2020-09-04 LAB — GLUCOSE, CAPILLARY
Glucose-Capillary: 108 mg/dL — ABNORMAL HIGH (ref 70–99)
Glucose-Capillary: 135 mg/dL — ABNORMAL HIGH (ref 70–99)
Glucose-Capillary: 123 mg/dL — ABNORMAL HIGH (ref 70–99)
Glucose-Capillary: 141 mg/dL — ABNORMAL HIGH (ref 70–99)

## 2020-09-04 LAB — SURGICAL PATHOLOGY

## 2020-09-04 NOTE — Progress Notes (Signed)
  Subjective: 2 Days Post-Op Procedure(s) (LRB): REVERSE SHOULDER ARTHROPLASTY (Right) Patient reports pain as moderate in the right shoulder Patient is well, and has had no acute complaints or problems, has not had a BM in several days. PT and care management to assist with discharge planning. Current plan is for discharge home with HHPT.  Daughter will be available to help the patient return home tomorrow. Negative for chest pain and shortness of breath Fever: no Gastrointestinal:Negative for nausea and vomiting this AM.  Objective: Vital signs in last 24 hours: Temp:  [97.8 F (36.6 C)-98.3 F (36.8 C)] 97.8 F (36.6 C) (04/28 0809) Pulse Rate:  [74-83] 79 (04/28 0809) Resp:  [16-18] 18 (04/28 0809) BP: (143-156)/(55-75) 153/69 (04/28 0809) SpO2:  [95 %-100 %] 97 % (04/28 0809)  Intake/Output from previous day:  Intake/Output Summary (Last 24 hours) at 09/04/2020 1013 Last data filed at 09/04/2020 0646 Gross per 24 hour  Intake 514.99 ml  Output --  Net 514.99 ml    Intake/Output this shift: No intake/output data recorded.  Labs: Recent Labs    09/03/20 0437 09/04/20 0528  HGB 9.9* 10.7*   Recent Labs    09/03/20 0437 09/04/20 0528  WBC 11.7* 13.7*  RBC 3.16* 3.40*  HCT 30.6* 33.0*  PLT 245 269   Recent Labs    09/03/20 0437 09/04/20 0528  NA 137 137  K 3.6 4.0  CL 103 101  CO2 24 27  BUN 39* 17  CREATININE 1.13* 0.67  GLUCOSE 102* 111*  CALCIUM 8.7* 9.1   No results for input(s): LABPT, INR in the last 72 hours.   EXAM General - Patient is Alert, Appropriate and Oriented Extremity - ABD soft Incision: dressing C/D/I  Patient is intact to light touch to the right arm.  Able to flex and extend the wrist without pain. Able to flex and extend her fingers without pain and make a full fist. Dressing/Incision - clean, dry, no drainage, honeycomb dressing without any active drainage. Motor Function - intact, moving foot and toes well on exam.   Abdomen soft with intact bowel sounds.  Past Medical History:  Diagnosis Date  . Anemia   . Bipolar affect, depressed ([place])   . COPD (chronic obstructive pulmonary disease) ([place])   . Diabetes mellitus without complication ([place])    type II  . GERD (gastroesophageal reflux disease)   . Hyperlipidemia   . Hypertension   . Hypothyroidism   . PTSD (post-traumatic stress disorder)   . Schizoaffective disorder ([place])   . TIA (transient ischemic attack) 2010    Assessment/Plan: 2 Days Post-Op Procedure(s) (LRB): REVERSE SHOULDER ARTHROPLASTY (Right) Active Problems:   Status post reverse arthroplasty of shoulder, right  Estimated body mass index is 33.79 kg/m as calculated from the following:   Height as of this encounter: 5' 4.5" (1.638 m).   Weight as of this encounter: 90.7 kg. Advance diet Up with therapy   Labs reviewed this AM. WBC 13.7 this AM.  CBC tomorrow morning. Patient denies any SOB this AM, no issues with urination at the moment. Encourage incentive spirometer. Patient has not had a BM, move to FLEET enema today. Up with therapy as tolerated. Plan for discharge home tomorrow with HHPT pending progress with PT today.  DVT Prophylaxis - Lovenox, TED hose and SCDs Non-weightbearing to the right arm.  Raquel Latorria Zeoli, PA-C [facility] 09/04/2020, 10:13 AM

## 2020-09-04 NOTE — Plan of Care (Signed)

## 2020-09-04 NOTE — Progress Notes (Signed)
Physical Therapy Treatment Patient Details Name: Angela Fernandez MRN: [id number] DOB: 1945-09-12 Today's Date: 09/04/2020    History of Present Illness Patient is a 75 year old female with traumatic complete tear of right rotator cuff with shoulder pain and weakness. History of previous right TSA. s/p reverse right TSA on 09/02/20. PMHx includes COPD, anxiety, depression, bipolar disorder, GERD, HTN, anemia, DMII, HLD, PTSD, and schizoaffective disorder, and TIA (2010). Patient had anesthetic complication of right sided corneal abrasion per anesthesia note.    PT Comments    Pt was just finishing up OT upon arriving. She agrees to PT session and is cooperative and pleasant throughout. Pt does present with some cognition concerns that come to light during session. Poor insight of deficits but was able to perform all desired task. She only required physical assistance with getting out of bed. Per pt, " my daughter took a week off to help me when I leave." Attempted to call pt's daughter without answer. Pt was able to ambulate 200 ft and perform ascending/descending 15 stairs without difficulty. Recommend pt use gait belt for home ambulation until balance/safety improves. Left gait belt in pt's room. Pt will continue to benefit from skilled PT at DC to address deficits while maximizing independence with ADLs. At conclusion of session, pt was in bed with call bell in reach and bed alarm in place.    Follow Up Recommendations  Home health PT;Supervision for mobility/OOB     Equipment Recommendations  3in1 (PT);Other (comment) (reports she has cane at home already)       Precautions / Restrictions Precautions Precautions: Fall;Shoulder Shoulder Interventions: Shoulder sling/immobilizer;At all times;Shoulder abduction pillow;Off for dressing/bathing/exercises Precaution Booklet Issued: Yes (comment) Required Braces or Orthoses: Sling Restrictions Weight Bearing Restrictions: Yes RUE Weight Bearing:  Non weight bearing    Mobility  Bed Mobility Overal bed mobility: Needs Assistance Bed Mobility: Supine to Sit;Sit to Supine     Supine to sit: Min assist (flat bed) Sit to supine: Min guard   General bed mobility comments: Pt performed getting in/out of bed 2 x during session. once with supervision however heavy use of bed rails and with HOB elevated. One time from flat bed surface with min assist    Transfers Overall transfer level: Needs assistance Equipment used: Straight cane Transfers: Sit to/from Stand Sit to Stand: Supervision            Ambulation/Gait Ambulation/Gait assistance: Min guard Gait Distance (Feet): 200 Feet Assistive device: Straight cane Gait Pattern/deviations: Narrow base of support (occasional scissoring however able to correct without intervention) Gait velocity: decreased   General Gait Details: Pt was able to safely ambulate 200 ft with SPC without fatigue. does have occasional scissoring but able to correct without intervention.   Stairs Stairs: Yes Stairs assistance: Min guard Stair Management: No rails;With cane;Step to pattern;Forwards Number of Stairs: 15 General stair comments: Pt was able to ascend/desced 15 stair to simulate pt's home environment.      Balance Overall balance assessment: Needs assistance;History of Falls Sitting-balance support: Feet supported;No upper extremity supported Sitting balance-Leahy Scale: Good     Standing balance support: Single extremity supported;During functional activity Standing balance-Leahy Scale: Fair Standing balance comment: is at high fall risk but does state daughter will be homw with her for first week after DC         Cognition Arousal/Alertness: Awake/alert Behavior During Therapy: WFL for tasks assessed/performed Overall Cognitive Status: No family/caregiver present to determine baseline cognitive functioning  General Comments: Pt is alert and cooperative however  does have some decreased safety awareness. Author attempted to contact daughter( patient lives with) to discuss baseline cognition      Exercises Other Exercises Other Exercises: Pt educated in falls prevention, need to call for staff when getting up, removed and repositioned polar care and shoulder sling/immobilizer for improved comfort and optimal positioning        Pertinent Vitals/Pain Pain Assessment: 0-10 Pain Score: 5  Pain Location: Right UE Pain Descriptors / Indicators: Burning;Discomfort Pain Intervention(s): Limited activity within patient's tolerance;Monitored during session;Premedicated before session;Repositioned;Ice applied       Prior Function            PT Goals (current goals can now be found in the care plan section) Acute Rehab PT Goals Patient Stated Goal: get better and go home Progress towards PT goals: Progressing toward goals    Frequency    7X/week      PT Plan Current plan remains appropriate       AM-PAC PT "6 Clicks" Mobility   Outcome Measure  Help needed turning from your back to your side while in a flat bed without using bedrails?: A Little Help needed moving from lying on your back to sitting on the side of a flat bed without using bedrails?: A Little Help needed moving to and from a bed to a chair (including a wheelchair)?: A Little Help needed standing up from a chair using your arms (e.g., wheelchair or bedside chair)?: A Little Help needed to walk in hospital room?: A Little Help needed climbing 3-5 steps with a railing? : A Little 6 Click Score: 18    End of Session Equipment Utilized During Treatment: Other (comment) (Pt was able to maintain >91 % sao2 with rm air only) Activity Tolerance: Patient tolerated treatment well Patient left: in bed;with call bell/phone within reach;with bed alarm set;with SCD's reapplied;with family/visitor present Nurse Communication: Mobility status PT Visit Diagnosis: Unsteadiness on feet  (R26.81);Muscle weakness (generalized) (M62.81);History of falling (Z91.81)     Time: 2979-8921 PT Time Calculation (min) (ACUTE ONLY): 16 min  Charges:  $Gait Training: 8-22 mins                     Julaine Fusi PTA 09/04/20, 2:33 PM

## 2020-09-04 NOTE — Progress Notes (Signed)
Occupational Therapy Treatment Patient Details Name: Angela Fernandez MRN: [id number] DOB: 11/14/1945 Today's Date: 09/04/2020    History of present illness Patient is a 75 year old female with traumatic complete tear of right rotator cuff with shoulder pain and weakness. History of previous right TSA. s/p reverse right TSA on 09/02/20. PMHx includes COPD, anxiety, depression, bipolar disorder, GERD, HTN, anemia, DMII, HLD, PTSD, and schizoaffective disorder, and TIA (2010). Patient had anesthetic complication of right sided corneal abrasion per anesthesia note.   OT comments  Pt seen for OT tx this date. Pt laying in bed, endorses recently getting back to bed from [facility] without assist. Pt's polar care not connected and SCDs not connected either. Pt reported that she didn't want to bother the staff. Pt educated in safety, falls prevention, and need to ask for assistance for staff for OOB needs/wants. Pt verbalized understanding. Pt required Min A for bed mobility and cues for sequencing as a result of poor positioning to start. Shoulder sling/immobilizer and polar care removed and replaced to improve comfort and optimal positioning. Pt endorsing feeling like the sling was much more supportive after adjustment and noted decreased pain/discomfort in R shoulder. Pt continues to require significant assist for sling/polar care mgt, LB ADL, and UB ADL. Continue to recommend [facility] services as well as initial supervision/assist from family/caregivers to ensure safety.    Follow Up Recommendations  Home health OT    Equipment Recommendations  Other (comment) (grab bars)    Recommendations for Other Services      Precautions / Restrictions Precautions Precautions: Fall;Shoulder Shoulder Interventions: Shoulder sling/immobilizer;At all times;Shoulder abduction pillow;Off for dressing/bathing/exercises Required Braces or Orthoses: Sling Restrictions Weight Bearing Restrictions: Yes RUE Weight Bearing: Non  weight bearing       Mobility Bed Mobility Overal bed mobility: Needs Assistance Bed Mobility: Supine to Sit;Sit to Supine     Supine to sit: HOB elevated;Min guard;Min assist Sit to supine: Min guard   General bed mobility comments: increased time, effort, Min A for trunk elevation, cues for sequencing to get sitting EOB after being poorly positioned in the bed    Transfers                      Balance Overall balance assessment: Needs assistance;History of Falls Sitting-balance support: Feet supported;No upper extremity supported Sitting balance-Leahy Scale: Good                                     ADL either performed or assessed with clinical judgement   ADL Overall ADL's : Needs assistance/impaired                                       General ADL Comments: Pt continues to require assist for ADL transfers, LB ADL tasks, and sling/polar caremgt     Vision Baseline Vision/History: Wears glasses Wears Glasses: Reading only     Perception     Praxis      Cognition Arousal/Alertness: Awake/alert Behavior During Therapy: WFL for tasks assessed/performed Overall Cognitive Status: No family/caregiver present to determine baseline cognitive functioning                                 General Comments: Pt demo's decreased insight  into deficits and safety awareness - pt reports getting back to bed from [facility] without assist and not calling for assist 2/2 not wanting to bother staff. Pt educated in falls prevention and safety        Exercises Other Exercises Other Exercises: Pt educated in falls prevention, need to call for staff when getting up, removed and repositioned polar care and shoulder sling/immobilizer for improved comfort and optimal positioning   Shoulder Instructions       General Comments      Pertinent Vitals/ Pain       Pain Assessment: 0-10 Pain Score: 6  Pain Location: Right UE Pain  Descriptors / Indicators: Burning;Discomfort Pain Intervention(s): Limited activity within patient's tolerance;Monitored during session;Premedicated before session;Repositioned;Ice applied  Home Living                                          Prior Functioning/Environment              Frequency  Min 2X/week        Progress Toward Goals  OT Goals(current goals can now be found in the care plan section)  Progress towards OT goals: Progressing toward goals  Acute Rehab OT Goals Patient Stated Goal: get better and go home OT Goal Formulation: With patient/family Time For Goal Achievement: 09/16/20 Potential to Achieve Goals: Good  Plan Discharge plan remains appropriate;Frequency remains appropriate    Co-evaluation                 AM-PAC OT "6 Clicks" Daily Activity     Outcome Measure   Help from another person eating meals?: A Little Help from another person taking care of personal grooming?: A Lot Help from another person toileting, which includes using toliet, bedpan, or urinal?: A Lot Help from another person bathing (including washing, rinsing, drying)?: A Lot Help from another person to put on and taking off regular upper body clothing?: A Lot Help from another person to put on and taking off regular lower body clothing?: A Lot 6 Click Score: 13    End of Session    OT Visit Diagnosis: Other abnormalities of gait and mobility (R26.89);History of falling (Z91.81);Pain Pain - Right/Left: Right Pain - part of body: Shoulder   Activity Tolerance Patient tolerated treatment well   Patient Left in bed;with call bell/phone within reach;with bed alarm set;with SCD's reapplied;Other (comment) (PTA in room for session)   Nurse Communication Mobility status;Other (comment) (seems to have gotten back to bed on her own; had large BM)        Time: 1497-0263 OT Time Calculation (min): 15 min  Charges: OT General Charges $OT Visit: 1  Visit OT Treatments $Self Care/Home Management : 8-22 mins  Hanley Hays, MPH, MS, OTR/L ascom [phone] 09/04/20, 2:38 PM

## 2020-09-05 LAB — GLUCOSE, CAPILLARY
Glucose-Capillary: 199 mg/dL — ABNORMAL HIGH (ref 70–99)
Glucose-Capillary: 134 mg/dL — ABNORMAL HIGH (ref 70–99)
Glucose-Capillary: 148 mg/dL — ABNORMAL HIGH (ref 70–99)
Glucose-Capillary: 105 mg/dL — ABNORMAL HIGH (ref 70–99)

## 2020-09-05 LAB — URINALYSIS, ROUTINE W REFLEX MICROSCOPIC
Bilirubin Urine: NEGATIVE
Glucose, UA: NEGATIVE mg/dL
Ketones, ur: NEGATIVE mg/dL
Nitrite: NEGATIVE
Protein, ur: NEGATIVE mg/dL
Specific Gravity, Urine: 1.021 (ref 1.005–1.030)
pH: 5 (ref 5.0–8.0)

## 2020-09-05 LAB — BASIC METABOLIC PANEL
Anion gap: 10 (ref 5–15)
BUN: 18 mg/dL (ref 8–23)
CO2: 26 mmol/L (ref 22–32)
Calcium: 9.7 mg/dL (ref 8.9–10.3)
Chloride: 98 mmol/L (ref 98–111)
Creatinine, Ser: 0.71 mg/dL (ref 0.44–1.00)
GFR, Estimated: >60 mL/min (ref >60)
Glucose, Bld: 124 mg/dL — ABNORMAL HIGH (ref 70–99)
Potassium: 4.1 mmol/L (ref 3.5–5.1)
Sodium: 134 mmol/L — ABNORMAL LOW (ref 135–145)

## 2020-09-05 MED ORDER — OXYCODONE HCL 5 MG PO TABS: 5.0000 mg | ORAL_TABLET | ORAL | 0 refills | Status: DC | PRN

## 2020-09-05 MED ORDER — SULFAMETHOXAZOLE-TRIMETHOPRIM 800-160 MG PO TABS
1.0000 | ORAL_TABLET | Freq: Two times a day (BID) | ORAL | Status: DC
  Administered 2020-09-05 – 2020-09-06 (×2): 1 via ORAL
  Filled 2020-09-05 (×2): qty 1

## 2020-09-05 MED ORDER — ASPIRIN EC 325 MG PO TBEC: 325.0000 mg | DELAYED_RELEASE_TABLET | Freq: Every day | ORAL | 0 refills | Status: DC

## 2020-09-05 NOTE — Progress Notes (Signed)
Physical Therapy Treatment Patient Details Name: Angela Fernandez MRN: [id number] DOB: 1945/10/14 Today's Date: 09/05/2020    History of Present Illness Patient is a 75 year old female with traumatic complete tear of right rotator cuff with shoulder pain and weakness. History of previous right TSA. s/p reverse right TSA on 09/02/20. PMHx includes COPD, anxiety, depression, bipolar disorder, GERD, HTN, anemia, DMII, HLD, PTSD, and schizoaffective disorder, and TIA (2010). Patient had anesthetic complication of right sided corneal abrasion per anesthesia note.    PT Comments    Pt was long sitting in bed with supportive daughter at bedside. Author returned to discuss DC and concerns. Upon arriving, pt's cognition much more altered than previously seen yesterday and earlier in day. Pt  required extensive assistance to safely get OOB and only was able to ambulate ~ 45 ft prior to needing to return to room. MD notified. Acute PT will change recs to SNF at DC due to safety concern and need for 24/7 assistance. Acute PT will return next date and continue to follow per POC. RN x 2 aware of pt's safety concerns. MD ordered UA.    Follow Up Recommendations  SNF     Equipment Recommendations  Other (comment) (defer to next level of care)       Precautions / Restrictions Precautions Precautions: Fall;Shoulder Shoulder Interventions: Shoulder sling/immobilizer;At all times;Shoulder abduction pillow;Off for dressing/bathing/exercises Precaution Booklet Issued: Yes (comment) Required Braces or Orthoses: Sling Restrictions Weight Bearing Restrictions: Yes RUE Weight Bearing: Non weight bearing    Mobility  Bed Mobility Overal bed mobility: Needs Assistance Bed Mobility: Supine to Sit;Sit to Supine     Supine to sit: Min assist;Mod assist     General bed mobility comments: Pt required more assistance to progress from supine to EOB short sit. Pt was c/o R foot pain and cervical pain.     Transfers Overall transfer level: Needs assistance Equipment used: Straight cane Transfers: Sit to/from Stand Sit to Stand: Min assist;Mod assist         General transfer comment: Pt required more assistance to stand from EOB surface. posterior LOB upon standing  Ambulation/Gait Ambulation/Gait assistance: Min assist Gait Distance (Feet): 45 Feet Assistive device: Straight cane Gait Pattern/deviations: Step-to pattern;Antalgic Gait velocity: decreased   General Gait Details: Pt is not safe with ambulation this afternoon. Pts daughter was with Chief Strategy Officer throughout session. MD notified in change in status.       Balance Overall balance assessment: Needs assistance;History of Falls Sitting-balance support: Feet supported;No upper extremity supported Sitting balance-Leahy Scale: Good     Standing balance support: Bilateral upper extremity supported;During functional activity Standing balance-Leahy Scale: Fair       Cognition Arousal/Alertness: Awake/alert Behavior During Therapy: WFL for tasks assessed/performed Overall Cognitive Status: Impaired/Different from baseline Area of Impairment: Orientation;Attention;Memory;Following commands;Safety/judgement;Awareness;Problem solving      General Comments: Pt's daughter arrived to DC to home however upon arriving, pt's cognition much different from previously observed.             Pertinent Vitals/Pain Pain Assessment: No/denies pain           PT Goals (current goals can now be found in the care plan section) Acute Rehab PT Goals Patient Stated Goal: get better and go home Progress towards PT goals: Progressing toward goals    Frequency    7X/week      PT Plan Discharge plan needs to be updated       AM-PAC PT "6 Clicks" Mobility  Outcome Measure  Help needed turning from your back to your side while in a flat bed without using bedrails?: A Little Help needed moving from lying on your back to sitting  on the side of a flat bed without using bedrails?: A Little Help needed moving to and from a bed to a chair (including a wheelchair)?: A Little Help needed standing up from a chair using your arms (e.g., wheelchair or bedside chair)?: A Little Help needed to walk in hospital room?: A Little Help needed climbing 3-5 steps with a railing? : A Little 6 Click Score: 18    End of Session Equipment Utilized During Treatment: Gait belt Activity Tolerance: Patient limited by pain;Other (comment) (limited by cognition deficits) Patient left: in bed;with call bell/phone within reach;with bed alarm set;with SCD's reapplied;with family/visitor present Nurse Communication: Mobility status PT Visit Diagnosis: Unsteadiness on feet (R26.81);Muscle weakness (generalized) (M62.81);History of falling (Z91.81)     Time: 6808-8110 PT Time Calculation (min) (ACUTE ONLY): 14 min  Charges:  $Therapeutic Activity: 8-22 mins                     Julaine Fusi PTA 09/05/20, 4:05 PM

## 2020-09-05 NOTE — Progress Notes (Signed)
  Subjective: 3 Days Post-Op Procedure(s) (LRB): REVERSE SHOULDER ARTHROPLASTY (Right) Patient reports pain as moderate in the right shoulder Patient is well, and has had no acute complaints or problems, reports she had a small BM yesterday. Current plan is for discharge home with HHPT, daughter is available to help at home beginning today. Negative for chest pain and shortness of breath Fever: no Gastrointestinal:Negative for nausea and vomiting this AM.  Objective: Vital signs in last 24 hours: Temp:  [97.8 F (36.6 C)-98.6 F (37 C)] 98.2 F (36.8 C) (04/29 0435) Pulse Rate:  [73-79] 73 (04/29 0435) Resp:  [16-18] 18 (04/29 0435) BP: (141-153)/(57-69) 141/57 (04/29 0435) SpO2:  [92 %-97 %] 92 % (04/29 0435)  Intake/Output from previous day: No intake or output data in the 24 hours ending 09/05/20 0747  Intake/Output this shift: No intake/output data recorded.  Labs: Recent Labs    09/03/20 0437 09/04/20 0528  HGB 9.9* 10.7*   Recent Labs    09/03/20 0437 09/04/20 0528  WBC 11.7* 13.7*  RBC 3.16* 3.40*  HCT 30.6* 33.0*  PLT 245 269   Recent Labs    09/04/20 0528 09/05/20 0434  NA 137 134*  K 4.0 4.1  CL 101 98  CO2 27 26  BUN 17 18  CREATININE 0.67 0.71  GLUCOSE 111* 124*  CALCIUM 9.1 9.7   No results for input(s): LABPT, INR in the last 72 hours.  EXAM General - Patient is Alert, Appropriate and Oriented Extremity - ABD soft Incision: dressing C/D/I  Patient is intact to light touch to the right arm.  Able to flex and extend the wrist without pain. Able to flex and extend her fingers without pain and make a full fist. Dressing/Incision - clean, dry, no drainage, honeycomb dressing without any active drainage. Motor Function - intact, moving foot and toes well on exam.  Abdomen soft with intact bowel sounds. Negative homans to bilateral lower extremities.  Past Medical History:  Diagnosis Date  . Anemia   . Bipolar affect, depressed ([place])   .  COPD (chronic obstructive pulmonary disease) ([place])   . Diabetes mellitus without complication ([place])    type II  . GERD (gastroesophageal reflux disease)   . Hyperlipidemia   . Hypertension   . Hypothyroidism   . PTSD (post-traumatic stress disorder)   . Schizoaffective disorder ([place])   . TIA (transient ischemic attack) 2010   Assessment/Plan: 3 Days Post-Op Procedure(s) (LRB): REVERSE SHOULDER ARTHROPLASTY (Right) Active Problems:   Status post reverse arthroplasty of shoulder, right  Estimated body mass index is 33.79 kg/m as calculated from the following:   Height as of this encounter: 5' 4.5" (1.638 m).   Weight as of this encounter: 90.7 kg. Advance diet Up with therapy   Vitals stable this AM. Patient did well with PT yesterday afternoon. Patient has had a small BM, move on to enema this AM. Up with therapy as tolerated. Plan for discharge home with HHPT today.  DVT Prophylaxis - Lovenox, TED hose and SCDs Non-weightbearing to the right arm.  Raquel Loveda Colaizzi, PA-C [facility] 09/05/2020, 7:47 AM

## 2020-09-05 NOTE — Progress Notes (Signed)
Occupational Therapy Treatment Patient Details Name: Angela Fernandez MRN: [id number] DOB: 06-Jul-1945 Today's Date: 09/05/2020    History of present illness Patient is a 75 year old female with traumatic complete tear of right rotator cuff with shoulder pain and weakness. History of previous right TSA. s/p reverse right TSA on 09/02/20. PMHx includes COPD, anxiety, depression, bipolar disorder, GERD, HTN, anemia, DMII, HLD, PTSD, and schizoaffective disorder, and TIA (2010). Patient had anesthetic complication of right sided corneal abrasion per anesthesia note.   OT comments  Pt seen for 2nd OT tx this date. Per staff report, pt recently became confused and removed shoulder sling and polar care. OT removed and repositioned to maximize optimal positioning, comfort, and to support carryover of education provided to pt and daughter who was present this afternoon. Pt/dtr educated in falls prevention strategies. Pt educated in use of call bell. Refilled polar care with ice. Pt demonstrating increased impairments in sitting balance this date and cognition which has limited pt's progress towards OT goals. Given pt's slow progress towards goals and increased confusion, recommending short term rehab to support safe recovery and eventual return home with supervision/assist.   Follow Up Recommendations  SNF;Supervision/Assistance - 24 hour    Equipment Recommendations       Recommendations for Other Services      Precautions / Restrictions Precautions Precautions: Fall;Shoulder Shoulder Interventions: Shoulder sling/immobilizer;At all times;Shoulder abduction pillow;Off for dressing/bathing/exercises Precaution Booklet Issued: Yes (comment) Required Braces or Orthoses: Sling Restrictions Weight Bearing Restrictions: Yes RUE Weight Bearing: Non weight bearing       Mobility Bed Mobility Overal bed mobility: Needs Assistance Bed Mobility: Sit to Supine     Supine to sit: Min assist;Mod  assist Sit to supine: Min guard   General bed mobility comments: MAX A for scooting up in bed for improved positioning, CGA back to bed    Transfers Overall transfer level: Needs assistance Equipment used: Straight cane Transfers: Sit to/from Stand Sit to Stand: Min assist;Mod assist         General transfer comment: Pt required more assistance to stand from EOB surface. posterior LOB upon standing    Balance Overall balance assessment: Needs assistance;History of Falls Sitting-balance support: Feet supported;No upper extremity supported Sitting balance-Leahy Scale: Poor Sitting balance - Comments: pt with recurrent posterior slight LOB requiring VC and tactile cues to correct Postural control: Posterior lean Standing balance support: Bilateral upper extremity supported;During functional activity Standing balance-Leahy Scale: Fair                             ADL either performed or assessed with clinical judgement   ADL Overall ADL's : Needs assistance/impaired                                       General ADL Comments: Pt continues to require significant assist for UB and LB ADL and sling/polar care mgt and today with ADL mobility 2/2 R foot pain     Vision       Perception     Praxis      Cognition Arousal/Alertness: Awake/alert Behavior During Therapy: WFL for tasks assessed/performed Overall Cognitive Status: Impaired/Different from baseline Area of Impairment: Orientation;Attention;Memory;Following commands;Safety/judgement;Awareness;Problem solving  General Comments: Pt mildly confused, earlier had taken sling off and has attempted a couple times to get OOB without assist, dtr endorses pt more confused than baseline        Exercises Other Exercises Other Exercises: Additional instruction provided to both pt and daughter present in sling application, positioning, polar care mgt, and  falls prevention strategies; sling and polar care adjusted for optimal positioning after pt had recently removed per staff report   Shoulder Instructions       General Comments      Pertinent Vitals/ Pain       Pain Assessment: 0-10 Pain Score: 8  Pain Location: R foot in arch and ball of foot Pain Descriptors / Indicators: Burning;Discomfort Pain Intervention(s): Limited activity within patient's tolerance;Monitored during session;Repositioned;Patient requesting pain meds-RN notified  Home Living                                          Prior Functioning/Environment              Frequency  Min 2X/week        Progress Toward Goals  OT Goals(current goals can now be found in the care plan section)  Progress towards OT goals: Not progressing toward goals - comment (pt demo's increased confusion limiting her safety wiht ADL/mobility)  Acute Rehab OT Goals Patient Stated Goal: get better and go home OT Goal Formulation: With patient/family Time For Goal Achievement: 09/16/20 Potential to Achieve Goals: [place] Discharge plan needs to be updated;Frequency remains appropriate    Co-evaluation                 AM-PAC OT "6 Clicks" Daily Activity     Outcome Measure   Help from another person eating meals?: A Little Help from another person taking care of personal grooming?: A Lot Help from another person toileting, which includes using toliet, bedpan, or urinal?: A Lot Help from another person bathing (including washing, rinsing, drying)?: A Lot Help from another person to put on and taking off regular upper body clothing?: A Lot Help from another person to put on and taking off regular lower body clothing?: A Lot 6 Click Score: 13    End of Session    OT Visit Diagnosis: Other abnormalities of gait and mobility (R26.89);History of falling (Z91.81);Pain Pain - Right/Left: Right Pain - part of body: Shoulder;Ankle and joints of foot    Activity Tolerance Patient tolerated treatment well   Patient Left in bed;with call bell/phone within reach;with bed alarm set;with SCD's reapplied;Other (comment);with family/visitor present (polar care and sling in place)   Nurse Communication Other (comment) (increased confusion, may need sitter overnight)        Time: 6815-9470 OT Time Calculation (min): 24 min  Charges: OT General Charges $OT Visit: 1 Visit OT Treatments $Self Care/Home Management : 23-37 mins  Hanley Hays, MPH, MS, OTR/L ascom [phone] 09/05/20, 4:44 PM

## 2020-09-05 NOTE — Plan of Care (Signed)

## 2020-09-05 NOTE — Care Management Important Message (Signed)
Important Message  Patient Details  Name: Angela Fernandez MRN: [id number] Date of Birth: [birth year]   Medicare Important Message Given:  Yes     Juliann Pulse A Rilyn Scroggs 09/05/2020, 10:06 AM

## 2020-09-05 NOTE — Progress Notes (Signed)
Physical Therapy Treatment Patient Details Name: Angela Fernandez MRN: [id number] DOB: 12/04/1945 Today's Date: 09/05/2020    History of Present Illness Patient is a 75 year old female with traumatic complete tear of right rotator cuff with shoulder pain and weakness. History of previous right TSA. s/p reverse right TSA on 09/02/20. PMHx includes COPD, anxiety, depression, bipolar disorder, GERD, HTN, anemia, DMII, HLD, PTSD, and schizoaffective disorder, and TIA (2010). Patient had anesthetic complication of right sided corneal abrasion per anesthesia note.    PT Comments    Pt was supine in bed with HOB elevated ~ 20 degrees upon arriving. She is alert and oriented x 3. Unable to corretcly state time. She was cooperative and pleasant throughout. Pt planning to DC home with daughter this afternoon. Author requested to meet with daughter prior to DC to discuss some safety concerns. Pt was able to exit bed (L side) with min assist. Stood to [facility] and ambulated 100 ft. Did perform stairs as review. Demonstrated safer technique with going sideways with +1 UE support on rail. Reports having 3 flights to enter home. Overall pt continues to demonstrate safe enough abilities to DC home with supervision. Will issued daughter gait belt for home use and educate of there ex, car transfers, home entry and home safety modifications.   Follow Up Recommendations  Home health PT;Supervision for mobility/OOB     Equipment Recommendations  3in1 (PT);Other (comment)       Precautions / Restrictions Precautions Precautions: Fall;Shoulder Shoulder Interventions: Shoulder sling/immobilizer;At all times;Shoulder abduction pillow;Off for dressing/bathing/exercises Precaution Booklet Issued: Yes (comment) Required Braces or Orthoses: Sling Restrictions Weight Bearing Restrictions: Yes RUE Weight Bearing: Non weight bearing    Mobility  Bed Mobility Overal bed mobility: Needs Assistance Bed Mobility: Supine to  Sit;Sit to Supine     Supine to sit: Min assist     General bed mobility comments: Pt continues to require min assist to safely exit L sid eof bed.    Transfers Overall transfer level: Needs assistance Equipment used: Straight cane Transfers: Sit to/from Stand Sit to Stand: Min guard         General transfer comment: CGA for safety  Ambulation/Gait Ambulation/Gait assistance: Min guard;Supervision Gait Distance (Feet): 100 Feet Assistive device: Straight cane Gait Pattern/deviations: Narrow base of support Gait velocity: decreased   General Gait Details: Pt was able to safely ambulate 100 ft with SPC without fatigue. Does have occasional scissoring but able to correct without intervention.   Stairs Stairs: Yes Stairs assistance: Min guard Stair Management: No rails;Forwards;One rail Left;Sideways Number of Stairs: 4 General stair comments: Pt was able to safely ascend/descend 4 stair with use of +1 rail   Wheelchair Mobility    Modified Rankin (Stroke Patients Only)       Balance Overall balance assessment: Needs assistance;History of Falls Sitting-balance support: Feet supported;No upper extremity supported Sitting balance-Leahy Scale: Good     Standing balance support: Single extremity supported;During functional activity Standing balance-Leahy Scale: Fair Standing balance comment: is at high fall risk but does state daughter will be homw with her for first week after DC      Cognition Arousal/Alertness: Awake/alert Behavior During Therapy: WFL for tasks assessed/performed Overall Cognitive Status: No family/caregiver present to determine baseline cognitive functioning      General Comments: Pt is alert and cooperative however does have some decreased safety awareness.             Pertinent Vitals/Pain Pain Assessment: 0-10 Pain Score: 7  Pain  Location: Right UE Pain Descriptors / Indicators: Burning;Discomfort Pain Intervention(s): Limited  activity within patient's tolerance;Monitored during session;Repositioned;Premedicated before session;Ice applied           PT Goals (current goals can now be found in the care plan section) Acute Rehab PT Goals Patient Stated Goal: get better and go home Progress towards PT goals: Progressing toward goals    Frequency    7X/week      PT Plan Current plan remains appropriate       AM-PAC PT "6 Clicks" Mobility   Outcome Measure  Help needed turning from your back to your side while in a flat bed without using bedrails?: A Little Help needed moving from lying on your back to sitting on the side of a flat bed without using bedrails?: A Little Help needed moving to and from a bed to a chair (including a wheelchair)?: A Little Help needed standing up from a chair using your arms (e.g., wheelchair or bedside chair)?: A Little Help needed to walk in hospital room?: A Little Help needed climbing 3-5 steps with a railing? : A Little 6 Click Score: 18    End of Session Equipment Utilized During Treatment: Gait belt Activity Tolerance: Patient tolerated treatment well Patient left: in bed;with call bell/phone within reach;with bed alarm set;with SCD's reapplied;with family/visitor present Nurse Communication: Mobility status PT Visit Diagnosis: Unsteadiness on feet (R26.81);Muscle weakness (generalized) (M62.81);History of falling (Z91.81)     Time: 7209-4709 PT Time Calculation (min) (ACUTE ONLY): 24 min  Charges:  $Gait Training: 8-22 mins $Therapeutic Activity: 8-22 mins                     Julaine Fusi PTA 09/05/20, 11:25 AM

## 2020-09-05 NOTE — Discharge Instructions (Signed)
Diet: As you were doing prior to hospitalization   Shower:  May shower but keep the wounds dry, use an occlusive plastic wrap, NO SOAKING IN TUB.  If the bandage gets wet, change with a clean dry gauze.  Dressing:  You may change your dressing as needed. Change the dressing with sterile gauze dressing.    Blood Clot Prevention: Take 1 325mg  aspirin daily for the next 30 days.  Activity:  Increase activity slowly as tolerated, but follow the weight bearing instructions below.  No lifting or driving for 6 weeks.  Weight Bearing:   Non-weightbearing to the right upper extremity.  To prevent constipation: you may use a stool softener such as -  Colace (over the counter) 100 mg by mouth twice a day  Drink plenty of fluids (prune juice may be helpful) and high fiber foods Miralax (over the counter) for constipation as needed.    Itching:  If you experience itching with your medications, try taking only a single pain pill, or even half a pain pill at a time.  You may take up to 10 pain pills per day, and you can also use benadryl over the counter for itching or also to help with sleep.   Precautions:  If you experience chest pain or shortness of breath - call 911 immediately for transfer to the hospital emergency department!!  If you develop a fever greater that 101 F, purulent drainage from wound, increased redness or drainage from wound, or calf pain-Call [place]                                              Follow- Up Appointment:  Please call for an appointment to be seen in 2 weeks at [facility]

## 2020-09-05 NOTE — Progress Notes (Signed)
Occupational Therapy Treatment Patient Details Name: Angela Fernandez MRN: [id number] DOB: 1946/02/24 Today's Date: 09/05/2020    History of present illness Patient is a 75 year old female with traumatic complete tear of right rotator cuff with shoulder pain and weakness. History of previous right TSA. s/p reverse right TSA on 09/02/20. PMHx includes COPD, anxiety, depression, bipolar disorder, GERD, HTN, anemia, DMII, HLD, PTSD, and schizoaffective disorder, and TIA (2010). Patient had anesthetic complication of right sided corneal abrasion per anesthesia note.   OT comments  Pt seen for OT tx this date. Pt in bed, polar care disconnected and pt reporting attempts to reposition into sidelying to support improved comfort for R foot. Pt endorses 10/10 R foot pain. Foot inspected, no visual issues identified. Gentle prolonged stretching to R foot and gentle AROM/PROM of R foot in attempt to improve comfort, pt endorsing pain went from 10 to 8/10 with stretching. Pt also reports having been premedicated but it is not working well to address pain adequately. Pillow placed between pt's knees to support R foot positioning. Pt educated on importance of keeping polar care connected for pain control and inflammation control as well as notifying nursing staff of any needs/concerns, especially any OOB needs. Pt verbalized understanding. Pt not progressing as expected. Pt noted with several steps into home and per pt's report, daughter only staying with her for 1 week. May need to consider short term rehab at a SNF if pt is unable to make meaningful gains in therapy soon. Pt endorses being open to this open if necessary.    Follow Up Recommendations  Home health OT;Other (comment) (supervision/assist for ADL mobility, bathing, dressing, and toileting)    Equipment Recommendations  Other (comment) (grab bars)    Recommendations for Other Services      Precautions / Restrictions Precautions Precautions:  Fall;Shoulder Shoulder Interventions: Shoulder sling/immobilizer;At all times;Shoulder abduction pillow;Off for dressing/bathing/exercises Precaution Booklet Issued: Yes (comment) Required Braces or Orthoses: Sling Restrictions Weight Bearing Restrictions: Yes RUE Weight Bearing: Non weight bearing       Mobility Bed Mobility Overal bed mobility: Needs Assistance Bed Mobility: Supine to Sit;Sit to Supine     Supine to sit: Min assist     General bed mobility comments: Pt repositioning to side lying in bed in attempts to improve R foot comfort, assist for bed positioning to optimize comfort but pt able to roll without assist    Transfers Overall transfer level: Needs assistance Equipment used: Straight cane Transfers: Sit to/from Stand Sit to Stand: Min guard         General transfer comment: CGA for safety    Balance Overall balance assessment: Needs assistance;History of Falls Sitting-balance support: Feet supported;No upper extremity supported Sitting balance-Leahy Scale: Good     Standing balance support: Single extremity supported;During functional activity Standing balance-Leahy Scale: Fair Standing balance comment: is at high fall risk but does state daughter will be homw with her for first week after DC                           ADL either performed or assessed with clinical judgement   ADL Overall ADL's : Needs assistance/impaired                                       General ADL Comments: Pt continues to require significant assist for UB and  LB ADL and sling/polar care mgt and today with ADL mobility 2/2 R foot pain     Vision       Perception     Praxis      Cognition Arousal/Alertness: Awake/alert Behavior During Therapy: WFL for tasks assessed/performed Overall Cognitive Status: No family/caregiver present to determine baseline cognitive functioning                                 General Comments:  Pt alert, agreeable to therapy, and generally does well; however does demonstrate some decreased safety awareness        Exercises Other Exercises Other Exercises: Gentle prolonged stretching to R foot and gentle AROM/PROM of R foot in attempt to improve comfort, pt endorsing pain went from 10 to 8/10 with stretching   Shoulder Instructions       General Comments      Pertinent Vitals/ Pain       Pain Assessment: 0-10 Pain Score: 10-Worst pain ever Pain Location: R foot in arch and ball of foot Pain Descriptors / Indicators: Burning;Discomfort Pain Intervention(s): Limited activity within patient's tolerance;Monitored during session;Repositioned;Premedicated before session  Home Living                                          Prior Functioning/Environment              Frequency  Min 2X/week        Progress Toward Goals  OT Goals(current goals can now be found in the care plan section)  Progress towards OT goals: Not progressing toward goals - comment  Acute Rehab OT Goals Patient Stated Goal: get better and go home OT Goal Formulation: With patient/family Time For Goal Achievement: 09/16/20 Potential to Achieve Goals: Good  Plan Discharge plan remains appropriate;Frequency remains appropriate    Co-evaluation                 AM-PAC OT "6 Clicks" Daily Activity     Outcome Measure   Help from another person eating meals?: A Little Help from another person taking care of personal grooming?: A Lot Help from another person toileting, which includes using toliet, bedpan, or urinal?: A Lot Help from another person bathing (including washing, rinsing, drying)?: A Lot Help from another person to put on and taking off regular upper body clothing?: A Lot Help from another person to put on and taking off regular lower body clothing?: A Lot 6 Click Score: 13    End of Session    OT Visit Diagnosis: Other abnormalities of gait and mobility  (R26.89);History of falling (Z91.81);Pain Pain - Right/Left: Right Pain - part of body: Shoulder;Ankle and joints of foot   Activity Tolerance Patient limited by pain   Patient Left in bed;with call bell/phone within reach;with bed alarm set;with SCD's reapplied;Other (comment) (polar care and sling in place)   Nurse Communication Other (comment) (R foot pain)        Time: 6734-1937 OT Time Calculation (min): 12 min  Charges: OT General Charges $OT Visit: 1 Visit OT Treatments $Therapeutic Activity: 8-22 mins  Hanley Hays, MPH, MS, OTR/L ascom [phone] 09/05/20, 11:47 AM

## 2020-09-05 NOTE — Discharge Summary (Addendum)
Physician Discharge Summary  Patient ID: Angela Fernandez MRN: [id number] DOB/AGE: 11-06-1945 75 y.o.  Admit date: 09/02/2020 Discharge date: 09/06/2020  Admission Diagnoses:  Status post reverse arthroplasty of shoulder, right [Z96.611]  Discharge Diagnoses: Patient Active Problem List   Diagnosis Date Noted  . Status post reverse arthroplasty of shoulder, right 09/02/2020    Past Medical History:  Diagnosis Date  . Anemia   . Bipolar affect, depressed ([place])   . COPD (chronic obstructive pulmonary disease) ([place])   . Diabetes mellitus without complication ([place])    type II  . GERD (gastroesophageal reflux disease)   . Hyperlipidemia   . Hypertension   . Hypothyroidism   . PTSD (post-traumatic stress disorder)   . Schizoaffective disorder ([place])   . TIA (transient ischemic attack) 2010   Transfusion: None.   Consultants (if any):   Discharged Condition: Improved  Hospital Course: Angela Fernandez is an 75 y.o. female who was admitted 09/02/2020 with a diagnosis of a massive irreparable rotator cuff tear with advanced cuff arthropathy of the right shoulder and went to the operating room on 09/02/2020 and underwent the above named procedures.    Surgeries: Procedure(s): REVERSE SHOULDER ARTHROPLASTY on 09/02/2020 Patient tolerated the surgery well. Taken to PACU where she was stabilized and then transferred to the orthopedic floor.  Started on Lovenox 44m q 24 hrs. Foot pumps applied bilaterally at 80 mm. Heels elevated on bed with rolled towels. No evidence of DVT. Negative [place]. Physical therapy started on day #1 for gait training and transfer. OT started day #1 for ADL and assisted devices.  The patient ambulated 45 feet with physical therapy on postop day 3  Patient's IV was removed on POD3.  Implants: All press-fit Bi[employer] Comprehensive system with a #7 micro-humeral stem, a +6 mm offset 40 mm humeral tray with a standard insert, and a medium augmented mini-base plate with a 36  mm glenosphere.  She was given perioperative antibiotics:  Anti-infectives (From admission, onward)   Start     Dose/Rate Route Frequency Ordered Stop   09/06/20 0000  sulfamethoxazole-trimethoprim (BACTRIM DS) 800-160 MG tablet        1 tablet Oral Every 12 hours 09/06/20 0643     09/05/20 2200  sulfamethoxazole-trimethoprim (BACTRIM DS) 800-160 MG per tablet 1 tablet        1 tablet Oral Every 12 hours 09/05/20 1656 09/15/20 2159   09/02/20 1400  ceFAZolin (ANCEF) IVPB 2g/100 mL premix        2 g 200 mL/hr over 30 Minutes Intravenous Every 6 hours 09/02/20 1202 09/04/20 0046   09/02/20 0609  ceFAZolin (ANCEF) 2-4 GM/100ML-% IVPB       Note to Pharmacy: WMaryagnes Amos  : cabinet override      09/02/20 0609 09/02/20 0805   09/02/20 0600  ceFAZolin (ANCEF) IVPB 2g/100 mL premix        2 g 200 mL/hr over 30 Minutes Intravenous On call to O.R. 09/01/20 2230 09/02/20 0818    .  She was given sequential compression devices, early ambulation, and Lovenox for DVT prophylaxis.  She benefited maximally from the hospital stay and there were no complications.    Recent vital signs:  Vitals:   09/05/20 1647 09/06/20 0445  BP: 129/74 (!) 147/83  Pulse: 76 80  Resp: 20 17  Temp: 98.7 F (37.1 C) 98.3 F (36.8 C)  SpO2: 93% 94%    Recent laboratory studies:  Lab Results  Component Value Date   HGB  10.7 (L) 09/04/2020   HGB 9.9 (L) 09/03/2020   HGB 11.6 (L) 08/22/2020   Lab Results  Component Value Date   WBC 13.7 (H) 09/04/2020   PLT 269 09/04/2020   No results found for: INR Lab Results  Component Value Date   NA 134 (L) 09/05/2020   K 4.1 09/05/2020   CL 98 09/05/2020   CO2 26 09/05/2020   BUN 18 09/05/2020   CREATININE 0.71 09/05/2020   GLUCOSE 124 (H) 09/05/2020    Discharge Medications:   Allergies as of 09/06/2020   No Known Allergies     Medication List    TAKE these medications   acetaminophen 325 MG tablet Commonly known as: TYLENOL Take 650 mg by  mouth every 4 (four) hours as needed.   albuterol 108 (90 Base) MCG/ACT inhaler Commonly known as: VENTOLIN HFA Inhale 2 puffs into the lungs every 6 (six) hours as needed for wheezing or shortness of breath.   amLODipine 10 MG tablet Commonly known as: NORVASC Take 1 tablet (10 mg total) by mouth daily. Take one-half tablet by mouth in the morning /please give the dose is only 1/2 tablet a day and disregard 1 tablet a day. What changed:   how much to take  additional instructions   Anoro Ellipta 62.5-25 MCG/INH Aepb Generic drug: umeclidinium-vilanterol Inhale 1 puff into the lungs daily.   aspirin EC 325 MG tablet Take 1 tablet (325 mg total) by mouth daily.   buPROPion 200 MG 12 hr tablet Commonly known as: WELLBUTRIN SR Take 1 tablet (200 mg total) by mouth 2 (two) times daily. Take 1 bid   clonazePAM 1 MG tablet Commonly known as: KLONOPIN Take 1.5 mg by mouth daily. Take 1.5 tabs in the am, 1 tabs at noon, and 1 tab at bedtime/ psych   cyclobenzaprine 10 MG tablet Commonly known as: FLEXERIL Take 1 tablet (10 mg total) by mouth at bedtime. What changed:   when to take this  reasons to take this   desvenlafaxine 50 MG 24 hr tablet Commonly known as: PRISTIQ Take 50 mg by mouth at bedtime.   docusate sodium 100 MG capsule Commonly known as: COLACE Take 200 mg by mouth 2 (two) times daily.   fenofibrate 145 MG tablet Commonly known as: TRICOR Take 1 tablet (145 mg total) by mouth daily. What changed: additional instructions   ferrous sulfate 325 (65 FE) MG tablet Take 1 tablet (325 mg total) by mouth daily with breakfast. What changed:   when to take this  additional instructions   Glucagon Emergency 1 MG Kit Inject 1 Device as directed as needed (hypoglycemia).   HAIR SKIN AND NAILS FORMULA PO Take 1 tablet by mouth at bedtime.   PRESERVISION AREDS PO Take 1 capsule by mouth in the morning and at bedtime.   hydrochlorothiazide 25 MG  tablet Commonly known as: HYDRODIURIL Take 1 tablet (25 mg total) by mouth daily. What changed: how much to take   lactulose 10 GM/15ML solution Commonly known as: CHRONULAC Take 10 g by mouth daily as needed.   Lantus SoloStar 100 UNIT/ML Solostar Pen Generic drug: insulin glargine Inject 30 Units into the skin daily. 30 units daily into skin   Latuda 120 MG Tabs Generic drug: Lurasidone HCl Take 1 tablet by mouth daily. Psych Takes at 4 pm   levothyroxine 175 MCG tablet Commonly known as: SYNTHROID Take 1 tablet (175 mcg total) by mouth daily before breakfast.   lisinopril 5 MG tablet  Commonly known as: ZESTRIL Take 1 tablet (5 mg total) by mouth daily.   meloxicam 7.5 MG tablet Commonly known as: MOBIC Take 1 tablet (7.5 mg total) by mouth daily. What changed: additional instructions   metoprolol succinate 100 MG 24 hr tablet Commonly known as: TOPROL-XL Take 1 tablet (100 mg total) by mouth daily. Take with or immediately following a meal.   multivitamins with iron Tabs tablet Take 1 tablet by mouth daily. Takes at 4 pm   NOVOLOG FLEXPEN [place] Inject 5 Units into the skin daily after supper.   omeprazole 40 MG capsule Commonly known as: PRILOSEC Take 1 capsule by mouth once daily   ondansetron 4 MG tablet Commonly known as: ZOFRAN Take 1 tablet by mouth daily as needed.   OVER THE COUNTER MEDICATION Take 1 Scoop by mouth daily in the afternoon. Collagen protein powder   oxyCODONE 5 MG immediate release tablet Commonly known as: Oxy IR/ROXICODONE Take 1 tablet (5 mg total) by mouth every 4 (four) hours as needed for moderate pain.   simvastatin 20 MG tablet Commonly known as: ZOCOR Take 1 tablet (20 mg total) by mouth daily. What changed: when to take this   sulfamethoxazole-trimethoprim 800-160 MG tablet Commonly known as: BACTRIM DS Take 1 tablet by mouth every 12 (twelve) hours.   SYSTANE OP Apply 2 drops to eye 3 (three) times daily as needed.    traMADol 50 MG tablet Commonly known as: ULTRAM Take 1 tablet (50 mg total) by mouth every 6 (six) hours as needed for moderate pain. What changed: reasons to take this   traZODone 50 MG tablet Commonly known as: DESYREL Take 50 mg by mouth at bedtime. Take one tablet at bedtime/ psych   Trulicity 5.59 RC/1.6LA Sopn Generic drug: Dulaglutide INJECT 1 SYRINGEFUL SUBCUTANEOUSLY ONCE A WEEK   zolpidem 5 MG tablet Commonly known as: AMBIEN Take 5 mg by mouth at bedtime.      Diagnostic Studies: DG Shoulder Right Port  Result Date: 09/02/2020 CLINICAL DATA:  Status post right reverse shoulder arthroplasty. EXAM: PORTABLE RIGHT SHOULDER COMPARISON:  None. FINDINGS: Two-view exam shows the patient to be status post right reverse shoulder arthroplasty. No evidence for immediate hardware complications. Bones are diffusely demineralized. IMPRESSION: Postoperative changes without evidence for hardware complication. Electronically Signed   By: Misty Stanley M.D.   On: 09/02/2020 12:36   [place] OR NERVE BLOCK-IMAGE ONLY [facility])  Result Date: 09/02/2020 There is no interpretation for this exam.  This order is for images obtained during a surgical procedure.  Please See "Surgeries" Tab for more information regarding the procedure.   Disposition: Discharge disposition: 01-Home or Self Care     Plan for discharge home with HHPT this afternoon when daughter is able to assist.   Follow-up Information    Duanne Guess, PA-C On 09/19/2020.   Specialties: Orthopedic Surgery, Emergency Medicine Why: Staple removal. 3:15 pm Contact information: [place] [address] [phone]              Signed: Joesphine Bare 09/06/2020, 6:43 AM

## 2020-09-06 LAB — GLUCOSE, CAPILLARY
Glucose-Capillary: 127 mg/dL — ABNORMAL HIGH (ref 70–99)
Glucose-Capillary: 103 mg/dL — ABNORMAL HIGH (ref 70–99)

## 2020-09-06 MED ORDER — TRAMADOL HCL 50 MG PO TABS: 50.0000 mg | ORAL_TABLET | Freq: Four times a day (QID) | ORAL | 0 refills | Status: DC | PRN

## 2020-09-06 MED ORDER — CYCLOBENZAPRINE HCL 10 MG PO TABS: 10.0000 mg | ORAL_TABLET | Freq: Every day | ORAL | 0 refills | Status: DC

## 2020-09-06 MED ORDER — SULFAMETHOXAZOLE-TRIMETHOPRIM 800-160 MG PO TABS: 1.0000 | ORAL_TABLET | Freq: Two times a day (BID) | ORAL | 0 refills | Status: DC

## 2020-09-06 NOTE — Progress Notes (Signed)
Physical Therapy Treatment Patient Details Name: Angela Fernandez MRN: [id number] DOB: 04/18/1946 Today's Date: 09/06/2020    History of Present Illness Patient is a 75 year old female with traumatic complete tear of right rotator cuff with shoulder pain and weakness. History of previous right TSA. s/p reverse right TSA on 09/02/20. PMHx includes COPD, anxiety, depression, bipolar disorder, GERD, HTN, anemia, DMII, HLD, PTSD, and schizoaffective disorder, and TIA (2010). Patient had anesthetic complication of right sided corneal abrasion per anesthesia note.    PT Comments    Pt was much more alert and oriented today versus yesterday. Daughter and son in law present throughout. Pt was able to demonstrate safe functional mobility throughout session and both pt/pt's family feel safe with DC to home with HHPT to follow.  Highly recommend continued skilled PT at DC to address deficits with balance while improving independence with ADLs.    Follow Up Recommendations  Home health PT;Supervision/Assistance - 24 hour;Supervision for mobility/OOB     Equipment Recommendations  Other (comment) (pt reports having all needed equipment at home)       Precautions / Restrictions Precautions Precautions: Fall;Shoulder Shoulder Interventions: Shoulder sling/immobilizer;At all times;Shoulder abduction pillow;Off for dressing/bathing/exercises Precaution Booklet Issued: Yes (comment) Required Braces or Orthoses: Sling Restrictions Weight Bearing Restrictions: Yes RUE Weight Bearing: Non weight bearing    Mobility  Bed Mobility Overal bed mobility: Needs Assistance Bed Mobility: Supine to Sit;Sit to Supine     Supine to sit: Min assist Sit to supine: Min assist   General bed mobility comments: Daughter assisted pt in/out of bed during session    Transfers Overall transfer level: Needs assistance Equipment used: Straight cane Transfers: Sit to/from Stand Sit to Stand: Min guard          General transfer comment: CGA for safety  Ambulation/Gait Ambulation/Gait assistance: Min guard Gait Distance (Feet): 75 Feet Assistive device: Straight cane Gait Pattern/deviations: Step-to pattern;Antalgic Gait velocity: decreased   General Gait Details: pt has much improved gait safety today versus yesterday   Stairs Stairs: Yes Stairs assistance: Min guard Stair Management: One rail Left;Forwards Number of Stairs: 4 General stair comments: Pt demonstrated safe ability to ascend/descend 4 stair with CGA for safety      Balance Overall balance assessment: Needs assistance;History of Falls Sitting-balance support: Feet supported;No upper extremity supported Sitting balance-Leahy Scale: Poor     Standing balance support: Bilateral upper extremity supported;During functional activity Standing balance-Leahy Scale: Fair       Cognition Arousal/Alertness: Awake/alert Behavior During Therapy: WFL for tasks assessed/performed Overall Cognitive Status:  (per family, "she is more at baseline today") Area of Impairment: Orientation;Attention;Memory;Following commands;Safety/judgement;Awareness;Problem solving      General Comments: Pt was A and able to consistently follow commands throughout         General Comments General comments (skin integrity, edema, etc.): reviewed NWB/positioning, polar care, and sling adjustments with pt/pt's daughter      Pertinent Vitals/Pain Pain Assessment: 0-10 Pain Score: 5  Faces Pain Scale: Hurts a little bit Pain Location: R foot in arch and ball of foot Pain Descriptors / Indicators: Burning;Discomfort Pain Intervention(s): Monitored during session;Limited activity within patient's tolerance;Premedicated before session;Repositioned;Ice applied           PT Goals (current goals can now be found in the care plan section) Acute Rehab PT Goals Patient Stated Goal: get better and go home Progress towards PT goals: Progressing toward  goals    Frequency    7X/week  PT Plan Discharge plan needs to be updated       AM-PAC PT "6 Clicks" Mobility   Outcome Measure  Help needed turning from your back to your side while in a flat bed without using bedrails?: A Little Help needed moving from lying on your back to sitting on the side of a flat bed without using bedrails?: A Little Help needed moving to and from a bed to a chair (including a wheelchair)?: A Little Help needed standing up from a chair using your arms (e.g., wheelchair or bedside chair)?: A Little Help needed to walk in hospital room?: A Little Help needed climbing 3-5 steps with a railing? : A Little 6 Click Score: 18    End of Session Equipment Utilized During Treatment: Gait belt Activity Tolerance: Patient tolerated treatment well Patient left: in bed;with call bell/phone within reach;with bed alarm set;with SCD's reapplied;with family/visitor present Nurse Communication: Mobility status PT Visit Diagnosis: Unsteadiness on feet (R26.81);Muscle weakness (generalized) (M62.81);History of falling (Z91.81)     Time: 3086-5784 PT Time Calculation (min) (ACUTE ONLY): 20 min  Charges:  $Gait Training: 8-22 mins                     Julaine Fusi PTA 09/06/20, 12:18 PM

## 2020-09-06 NOTE — Progress Notes (Signed)
Pt provided discharge instructions and all printed prescriptions. VSS, Daughter accompanied pt at discharge and mode of transport is wheelchair.    09/06/20 1234  Vitals  Temp 98 F (36.7 C)  BP 129/71  MAP (mmHg) 89  BP Location Left Arm  BP Method Automatic  Patient Position (if appropriate) Sitting  Pulse Rate 80  Resp 18  MEWS COLOR  MEWS Score Color Green  Oxygen Therapy  SpO2 94 %  O2 Device Room [employer]

## 2020-09-06 NOTE — Progress Notes (Signed)
No acute events overnight condition remains stable pain controlled on oral analgesics. Observation contunes

## 2020-09-06 NOTE — Plan of Care (Signed)
  Problem: Education: Goal: Knowledge of General Education information will improve Description: Including pain rating scale, medication(s)/side effects and non-pharmacologic comfort measures Outcome: Progressing   Problem: Health Behavior/Discharge Planning: Goal: Ability to manage health-related needs will improve Outcome: Progressing   Problem: Clinical Measurements: Goal: Ability to maintain clinical measurements within normal limits will improve Outcome: Progressing Goal: Diagnostic test results will improve Outcome: Progressing Goal: Cardiovascular complication will be avoided Outcome: Progressing   Problem: Activity: Goal: Risk for activity intolerance will decrease Outcome: Progressing   

## 2020-09-06 NOTE — Progress Notes (Signed)
  Subjective: 4 Days Post-Op Procedure(s) (LRB): REVERSE SHOULDER ARTHROPLASTY (Right) Patient reports pain as moderate in the right shoulder Patient is well, and has had no acute complaints or problems, reports she had a small BM Thursday. Current plan is for discharge home with HHPT, daughter is available to help at home beginning today. Negative for chest pain and shortness of breath Fever: no Gastrointestinal:Negative for nausea and vomiting this AM.  Objective: Vital signs in last 24 hours: Temp:  [98.3 F (36.8 C)-98.7 F (37.1 C)] 98.3 F (36.8 C) (04/30 0445) Pulse Rate:  [76-80] 80 (04/30 0445) Resp:  [17-20] 17 (04/30 0445) BP: (129-158)/(55-83) 147/83 (04/30 0445) SpO2:  [93 %-100 %] 94 % (04/30 0445)  Intake/Output from previous day:  Intake/Output Summary (Last 24 hours) at 09/06/2020 0637 Last data filed at 09/05/2020 1027 Gross per 24 hour  Intake 240 ml  Output --  Net 240 ml    Intake/Output this shift: No intake/output data recorded.  Labs: Recent Labs    09/04/20 0528  HGB 10.7*   Recent Labs    09/04/20 0528  WBC 13.7*  RBC 3.40*  HCT 33.0*  PLT 269   Recent Labs    09/04/20 0528 09/05/20 0434  NA 137 134*  K 4.0 4.1  CL 101 98  CO2 27 26  BUN 17 18  CREATININE 0.67 0.71  GLUCOSE 111* 124*  CALCIUM 9.1 9.7   No results for input(s): LABPT, INR in the last 72 hours.  EXAM General - Patient is Alert, Appropriate and Oriented Extremity - ABD soft Incision: dressing C/D/I  Patient is intact to light touch to the right arm.  Able to flex and extend the wrist without pain. Able to flex and extend her fingers without pain and make a full fist. Dressing/Incision - clean, dry, no drainage, honeycomb dressing without any active drainage. Motor Function - intact, moving foot and toes well on exam.  Negative homans to bilateral lower extremities.  Past Medical History:  Diagnosis Date  . Anemia   . Bipolar affect, depressed ([place])   .  COPD (chronic obstructive pulmonary disease) ([place])   . Diabetes mellitus without complication ([place])    type II  . GERD (gastroesophageal reflux disease)   . Hyperlipidemia   . Hypertension   . Hypothyroidism   . PTSD (post-traumatic stress disorder)   . Schizoaffective disorder ([place])   . TIA (transient ischemic attack) 2010   Assessment/Plan: 4 Days Post-Op Procedure(s) (LRB): REVERSE SHOULDER ARTHROPLASTY (Right) Active Problems:   Status post reverse arthroplasty of shoulder, right  Estimated body mass index is 33.79 kg/m as calculated from the following:   Height as of this encounter: 5' 4.5" (1.638 m).   Weight as of this encounter: 90.7 kg. Advance diet Up with therapy   Vitals stable this AM. Patient did well with PT yesterday afternoon. Patient has had a small BM, move on to enema this AM. Up with therapy as tolerated. Plan for discharge home with HHPT today.  DVT Prophylaxis - Lovenox, TED hose and SCDs Non-weightbearing to the right arm.  Reche Dixon, PA-C [facility] Orthopaedic Surgery 09/06/2020, 6:37 AM

## 2020-09-07 DIAGNOSIS — Z794 Long term (current) use of insulin: Secondary | ICD-10-CM | POA: Diagnosis not present

## 2020-09-07 DIAGNOSIS — Z96653 Presence of artificial knee joint, bilateral: Secondary | ICD-10-CM | POA: Diagnosis not present

## 2020-09-07 DIAGNOSIS — Z8673 Personal history of transient ischemic attack (TIA), and cerebral infarction without residual deficits: Secondary | ICD-10-CM | POA: Diagnosis not present

## 2020-09-07 DIAGNOSIS — E119 Type 2 diabetes mellitus without complications: Secondary | ICD-10-CM | POA: Diagnosis not present

## 2020-09-07 DIAGNOSIS — K219 Gastro-esophageal reflux disease without esophagitis: Secondary | ICD-10-CM | POA: Diagnosis not present

## 2020-09-07 DIAGNOSIS — Z87891 Personal history of nicotine dependence: Secondary | ICD-10-CM | POA: Diagnosis not present

## 2020-09-07 DIAGNOSIS — S46011D Strain of muscle(s) and tendon(s) of the rotator cuff of right shoulder, subsequent encounter: Secondary | ICD-10-CM | POA: Diagnosis not present

## 2020-09-07 DIAGNOSIS — Z9181 History of falling: Secondary | ICD-10-CM | POA: Diagnosis not present

## 2020-09-07 DIAGNOSIS — Z7982 Long term (current) use of aspirin: Secondary | ICD-10-CM | POA: Diagnosis not present

## 2020-09-07 DIAGNOSIS — J449 Chronic obstructive pulmonary disease, unspecified: Secondary | ICD-10-CM | POA: Diagnosis not present

## 2020-09-07 DIAGNOSIS — I10 Essential (primary) hypertension: Secondary | ICD-10-CM | POA: Diagnosis not present

## 2020-09-07 DIAGNOSIS — D649 Anemia, unspecified: Secondary | ICD-10-CM | POA: Diagnosis not present

## 2020-09-07 DIAGNOSIS — E785 Hyperlipidemia, unspecified: Secondary | ICD-10-CM | POA: Diagnosis not present

## 2020-09-07 DIAGNOSIS — Z79899 Other long term (current) drug therapy: Secondary | ICD-10-CM | POA: Diagnosis not present

## 2020-09-07 DIAGNOSIS — Z96611 Presence of right artificial shoulder joint: Secondary | ICD-10-CM | POA: Diagnosis not present

## 2020-09-07 DIAGNOSIS — E039 Hypothyroidism, unspecified: Secondary | ICD-10-CM | POA: Diagnosis not present

## 2020-09-08 DIAGNOSIS — S46011D Strain of muscle(s) and tendon(s) of the rotator cuff of right shoulder, subsequent encounter: Secondary | ICD-10-CM | POA: Diagnosis not present

## 2020-09-09 ENCOUNTER — Encounter: Payer: Self-pay | Admitting: Family Medicine

## 2020-09-09 DIAGNOSIS — I10 Essential (primary) hypertension: Secondary | ICD-10-CM | POA: Diagnosis not present

## 2020-09-09 DIAGNOSIS — E039 Hypothyroidism, unspecified: Secondary | ICD-10-CM | POA: Diagnosis not present

## 2020-09-09 DIAGNOSIS — D649 Anemia, unspecified: Secondary | ICD-10-CM | POA: Diagnosis not present

## 2020-09-09 DIAGNOSIS — K219 Gastro-esophageal reflux disease without esophagitis: Secondary | ICD-10-CM | POA: Diagnosis not present

## 2020-09-09 DIAGNOSIS — E119 Type 2 diabetes mellitus without complications: Secondary | ICD-10-CM | POA: Diagnosis not present

## 2020-09-09 DIAGNOSIS — E785 Hyperlipidemia, unspecified: Secondary | ICD-10-CM | POA: Diagnosis not present

## 2020-09-09 DIAGNOSIS — J449 Chronic obstructive pulmonary disease, unspecified: Secondary | ICD-10-CM | POA: Diagnosis not present

## 2020-09-09 DIAGNOSIS — Z96611 Presence of right artificial shoulder joint: Secondary | ICD-10-CM | POA: Diagnosis not present

## 2020-09-09 DIAGNOSIS — Z9181 History of falling: Secondary | ICD-10-CM | POA: Diagnosis not present

## 2020-09-09 DIAGNOSIS — Z87891 Personal history of nicotine dependence: Secondary | ICD-10-CM | POA: Diagnosis not present

## 2020-09-09 DIAGNOSIS — Z8673 Personal history of transient ischemic attack (TIA), and cerebral infarction without residual deficits: Secondary | ICD-10-CM | POA: Diagnosis not present

## 2020-09-09 DIAGNOSIS — Z79899 Other long term (current) drug therapy: Secondary | ICD-10-CM | POA: Diagnosis not present

## 2020-09-09 DIAGNOSIS — Z96653 Presence of artificial knee joint, bilateral: Secondary | ICD-10-CM | POA: Diagnosis not present

## 2020-09-09 DIAGNOSIS — Z794 Long term (current) use of insulin: Secondary | ICD-10-CM | POA: Diagnosis not present

## 2020-09-09 DIAGNOSIS — S46011D Strain of muscle(s) and tendon(s) of the rotator cuff of right shoulder, subsequent encounter: Secondary | ICD-10-CM | POA: Diagnosis not present

## 2020-09-09 DIAGNOSIS — Z7982 Long term (current) use of aspirin: Secondary | ICD-10-CM | POA: Diagnosis not present

## 2020-09-10 ENCOUNTER — Other Ambulatory Visit: Payer: Self-pay

## 2020-09-10 ENCOUNTER — Ambulatory Visit (INDEPENDENT_AMBULATORY_CARE_PROVIDER_SITE_OTHER): Payer: Medicare Other | Admitting: Family Medicine

## 2020-09-10 ENCOUNTER — Encounter: Payer: Self-pay | Admitting: Family Medicine

## 2020-09-10 ENCOUNTER — Ambulatory Visit: Payer: Medicare Other

## 2020-09-10 VITALS — BP 100/62 | HR 68 | Ht 64.5 in | Wt 198.0 lb

## 2020-09-10 DIAGNOSIS — I10 Essential (primary) hypertension: Secondary | ICD-10-CM

## 2020-09-10 DIAGNOSIS — Z79899 Other long term (current) drug therapy: Secondary | ICD-10-CM | POA: Diagnosis not present

## 2020-09-10 DIAGNOSIS — Z794 Long term (current) use of insulin: Secondary | ICD-10-CM | POA: Diagnosis not present

## 2020-09-10 DIAGNOSIS — E1159 Type 2 diabetes mellitus with other circulatory complications: Secondary | ICD-10-CM | POA: Diagnosis not present

## 2020-09-10 NOTE — Progress Notes (Signed)
Date:  09/10/2020   Name:  Angela Fernandez   DOB:  [birth year]   MRN:  [id number]   Chief Complaint: Hypotension  Hypertension This is a chronic problem. The current episode started more than 1 year ago. The problem has been gradually improving since onset. The problem is controlled. Associated symptoms include malaise/fatigue and shortness of breath. Pertinent negatives include no anxiety, blurred vision, chest pain, headaches, neck pain, orthopnea, palpitations, peripheral edema, PND or sweats. There are no associated agents to hypertension. Past treatments include ACE inhibitors, beta blockers, calcium channel blockers and diuretics. The current treatment provides moderate improvement. There are no compliance problems.  There is no history of angina, kidney disease, CAD/MI, CVA, heart failure, left ventricular hypertrophy, PVD or retinopathy. tia. There is no history of chronic renal disease, a hypertension causing med or renovascular disease.    Lab Results  Component Value Date   CREATININE 0.71 09/05/2020   BUN 18 09/05/2020   NA 134 (L) 09/05/2020   K 4.1 09/05/2020   CL 98 09/05/2020   CO2 26 09/05/2020   Lab Results  Component Value Date   CHOL 182 04/28/2020   HDL 43 04/28/2020   LDLCALC 98 04/28/2020   TRIG 241 (H) 04/28/2020   Lab Results  Component Value Date   TSH 0.599 04/28/2020   Lab Results  Component Value Date   HGBA1C 5.8 (H) 04/28/2020   Lab Results  Component Value Date   WBC 13.7 (H) 09/04/2020   HGB 10.7 (L) 09/04/2020   HCT 33.0 (L) 09/04/2020   MCV 97.1 09/04/2020   PLT 269 09/04/2020   Lab Results  Component Value Date   ALT 19 08/22/2020   AST 23 08/22/2020   ALKPHOS 19 (L) 08/22/2020   BILITOT 0.5 08/22/2020     Review of Systems  Constitutional: Positive for malaise/fatigue. Negative for chills, fatigue, fever and unexpected weight change.  HENT: Negative for congestion, ear discharge, ear pain, rhinorrhea, sinus pressure, sneezing  and sore throat.   Eyes: Negative for blurred vision, photophobia, pain, discharge, redness and itching.  Respiratory: Positive for shortness of breath. Negative for cough, wheezing and stridor.   Cardiovascular: Negative for chest pain, palpitations, orthopnea and PND.  Gastrointestinal: Negative for abdominal pain, blood in stool, constipation, diarrhea, nausea and vomiting.  Endocrine: Negative for cold intolerance, heat intolerance, polydipsia, polyphagia and polyuria.  Genitourinary: Negative for dysuria, flank pain, frequency, hematuria, menstrual problem, pelvic pain, urgency, vaginal bleeding and vaginal discharge.  Musculoskeletal: Negative for arthralgias, back pain, myalgias and neck pain.  Skin: Negative for rash.  Allergic/Immunologic: Negative for environmental allergies and food allergies.  Neurological: Negative for dizziness, weakness, light-headedness, numbness and headaches.  Hematological: Negative for adenopathy. Does not bruise/bleed easily.  Psychiatric/Behavioral: Negative for dysphoric mood. The patient is not nervous/anxious.     Patient Active Problem List   Diagnosis Date Noted  . Status post reverse arthroplasty of shoulder, right 09/02/2020    No Known Allergies  Past Surgical History:  Procedure Laterality Date  . BILATERAL CARPAL TUNNEL RELEASE Bilateral   . BREAST SURGERY Right    lumpectomy  . CATARACT EXTRACTION W/ INTRAOCULAR LENS  IMPLANT, BILATERAL Bilateral   . CHOLECYSTECTOMY    . COLONOSCOPY    . ESOPHAGOGASTRODUODENOSCOPY    . EYE SURGERY    . JOINT REPLACEMENT    . NASAL SINUS SURGERY    . REPLACEMENT TOTAL KNEE BILATERAL Bilateral   . REVERSE SHOULDER ARTHROPLASTY Right 09/02/2020   Procedure:  REVERSE SHOULDER ARTHROPLASTY;  Surgeon: Corky Mull, MD;  Location: [facility] ORS;  Service: Orthopedics;  Laterality: Right;  . SHOULDER SURGERY Right 2020   rotator cuff repair    Social History   Tobacco Use  . Smoking status: Former Smoker     Years: 54.00    Types: Cigarettes    Quit date: 05/10/2016    Years since quitting: 4.3  . Smokeless tobacco: Never Used  Vaping Use  . Vaping Use: Never used  Substance Use Topics  . Alcohol use: Not Currently    Comment: quit in age 36's or 11's  . Drug use: Not Currently    Types: Cocaine, Heroin, Marijuana, LSD    Comment: last used in age 56's     Medication list has been reviewed and updated.  Current Meds  Medication Sig  . acetaminophen (TYLENOL) 325 MG tablet Take 650 mg by mouth every 4 (four) hours as needed.  Marland Kitchen albuterol (VENTOLIN HFA) 108 (90 Base) MCG/ACT inhaler Inhale 2 puffs into the lungs every 6 (six) hours as needed for wheezing or shortness of breath.  Marland Kitchen amLODipine (NORVASC) 10 MG tablet Take 1 tablet (10 mg total) by mouth daily. Take one-half tablet by mouth in the morning /please give the dose is only 1/2 tablet a day and disregard 1 tablet a day. (Patient taking differently: Take 5 mg by mouth daily. Take one-half tablet by mouth in the morning)  . aspirin EC 325 MG tablet Take 1 tablet (325 mg total) by mouth daily.  Marland Kitchen buPROPion (WELLBUTRIN SR) 200 MG 12 hr tablet Take 1 tablet (200 mg total) by mouth 2 (two) times daily. Take 1 bid  . clonazePAM (KLONOPIN) 1 MG tablet Take 1.5 mg by mouth daily. Take 1.5 tabs in the am, 1 tabs at noon, and 1 tab at bedtime/ psych  . cyclobenzaprine (FLEXERIL) 10 MG tablet Take 1 tablet (10 mg total) by mouth at bedtime.  Marland Kitchen desvenlafaxine (PRISTIQ) 50 MG 24 hr tablet Take 50 mg by mouth at bedtime.  . docusate sodium (COLACE) 100 MG capsule Take 200 mg by mouth 2 (two) times daily.  . fenofibrate (TRICOR) 145 MG tablet Take 1 tablet (145 mg total) by mouth daily. (Patient taking differently: Take 145 mg by mouth daily. 4pm)  . ferrous sulfate 325 (65 FE) MG tablet Take 1 tablet (325 mg total) by mouth daily with breakfast. (Patient taking differently: Take 325 mg by mouth daily. Takes at 4 pm)  . Glucagon, rDNA, (GLUCAGON  EMERGENCY) 1 MG KIT Inject 1 Device as directed as needed (hypoglycemia).  . hydrochlorothiazide (HYDRODIURIL) 25 MG tablet Take 1 tablet (25 mg total) by mouth daily. (Patient taking differently: Take 12.5 mg by mouth daily.)  . Insulin Aspart (NOVOLOG FLEXPEN [place]) Inject 5 Units into the skin daily after supper.  . insulin glargine (LANTUS SOLOSTAR) 100 UNIT/ML Solostar Pen Inject 30 Units into the skin daily. 30 units daily into skin  . lactulose (CHRONULAC) 10 GM/15ML solution Take 10 g by mouth daily as needed.  Marland Kitchen levothyroxine (SYNTHROID) 175 MCG tablet Take 1 tablet (175 mcg total) by mouth daily before breakfast.  . lisinopril (ZESTRIL) 5 MG tablet Take 1 tablet (5 mg total) by mouth daily.  . Lurasidone HCl (LATUDA) 120 MG TABS Take 1 tablet by mouth daily. Psych Takes at 4 pm  . meloxicam (MOBIC) 7.5 MG tablet Take 1 tablet (7.5 mg total) by mouth daily. (Patient taking differently: Take 7.5 mg by mouth daily.  May take another dose in the afternoon)  . metoprolol succinate (TOPROL-XL) 100 MG 24 hr tablet Take 1 tablet (100 mg total) by mouth daily. Take with or immediately following a meal.  . Multiple Vitamins-Minerals (HAIR SKIN AND NAILS FORMULA PO) Take 1 tablet by mouth at bedtime.  . Multiple Vitamins-Minerals (PRESERVISION AREDS PO) Take 1 capsule by mouth in the morning and at bedtime.  Marland Kitchen omeprazole (PRILOSEC) 40 MG capsule Take 1 capsule by mouth once daily  . OVER THE COUNTER MEDICATION Take 1 Scoop by mouth daily in the afternoon. Collagen protein powder  . oxyCODONE (OXY IR/ROXICODONE) 5 MG immediate release tablet Take 1 tablet (5 mg total) by mouth every 4 (four) hours as needed for moderate pain.  Vladimir Faster Glycol-Propyl Glycol (SYSTANE OP) Apply 2 drops to eye 3 (three) times daily as needed.  . simvastatin (ZOCOR) 20 MG tablet Take 1 tablet (20 mg total) by mouth daily. (Patient taking differently: Take 20 mg by mouth at bedtime.)  . sulfamethoxazole-trimethoprim  (BACTRIM DS) 800-160 MG tablet Take 1 tablet by mouth every 12 (twelve) hours.  . traMADol (ULTRAM) 50 MG tablet Take 1 tablet (50 mg total) by mouth every 6 (six) hours as needed for moderate pain.  . traZODone (DESYREL) 50 MG tablet Take 50 mg by mouth at bedtime. Take one tablet at bedtime/ psych  . TRULICITY 1.30 QM/5.7QI SOPN INJECT 1 SYRINGEFUL SUBCUTANEOUSLY ONCE A WEEK  . umeclidinium-vilanterol (ANORO ELLIPTA) 62.5-25 MCG/INH AEPB Inhale 1 puff into the lungs daily.  Marland Kitchen zolpidem (AMBIEN) 5 MG tablet Take 5 mg by mouth at bedtime.  . [DISCONTINUED] Multiple Vitamins-Iron (MULTIVITAMINS WITH IRON) TABS tablet Take 1 tablet by mouth daily. Takes at 4 pm    PHQ 2/9 Scores 02/19/2020  PHQ - 2 Score 3  PHQ- 9 Score 8    GAD 7 : Generalized Anxiety Score 02/19/2020  Nervous, Anxious, on Edge 0  Control/stop worrying 1  Worry too much - different things 0  Trouble relaxing 0  Restless 0  Easily annoyed or irritable 0  Afraid - awful might happen 0  Total GAD 7 Score 1  Anxiety Difficulty Somewhat difficult    BP Readings from Last 3 Encounters:  09/10/20 100/62  09/06/20 129/71  08/22/20 129/63    Physical Exam  Wt Readings from Last 3 Encounters:  09/10/20 198 lb (89.8 kg)  09/02/20 199 lb 15.3 oz (90.7 kg)  08/22/20 200 lb (90.7 kg)    BP 100/62 (Patient Position: Sitting)   Pulse 68   Ht 5' 4.5" (1.638 m)   Wt 198 lb (89.8 kg)   BMI 33.46 kg/m   Assessment and Plan: Overall patient has had significant improvement of her medical circumstances and that she is lost a significant amount of weight and has had improvement of all of her numbers including diabetes and hypertension. 1. Type 2 diabetes mellitus with other circulatory complication, with long-term current use of insulin ([place]) Patient has fasting sugars that are in the 1 15-1 20 range and is doing very well at current dosing of Lantus 20 units in NovoLog 5 units with evening meal as well as Trulicity.  Patient  is followed by [facility] endocrinology but her next appointment is later on this month and has been sometime and patient has been doing very well regimen but is starting to be somewhat concerning about level of control.  First her glucoses are in the 70 range for her preevening meal and therefore we have suggested  that she discontinue her NovoLog at this time.  We have also began slowly decreasing her Lantus by 2 units every 2 weeks until she returns to see me in 6 weeks at which time she should have seen endocrinology to see if this progresses or needs to be maintained or further decreased.  2. Primary hypertension Blood pressure has come down significantly to the point that she is starting to get somewhat orthostatic.  On evaluation it is noted that there is some dehydration from her mucous membranes evaluation.  Blood pressure today is significantly improved at 100/62.  Given that she is somewhat symptomatic with change in position we will begin decreasing her hypertensive treatment.  We will discontinue her amlodipine and hydrochlorothiazide and will continue her lisinopril and beta-blocker metoprolol as well we will recheck in 6 weeks and reevaluate any further continuance of care  3. Polypharmacy Patient had a significant list of polypharmacy upon arrival from previous medical circumstance.  Given that she is under better medical supervision by family and that patient has also demonstrated a significant effort on her part we will can begin to pull back on some of her medications including over-the-counter preparations.

## 2020-09-11 DIAGNOSIS — E785 Hyperlipidemia, unspecified: Secondary | ICD-10-CM | POA: Diagnosis not present

## 2020-09-11 DIAGNOSIS — J449 Chronic obstructive pulmonary disease, unspecified: Secondary | ICD-10-CM | POA: Diagnosis not present

## 2020-09-11 DIAGNOSIS — Z79899 Other long term (current) drug therapy: Secondary | ICD-10-CM | POA: Diagnosis not present

## 2020-09-11 DIAGNOSIS — E039 Hypothyroidism, unspecified: Secondary | ICD-10-CM | POA: Diagnosis not present

## 2020-09-11 DIAGNOSIS — Z87891 Personal history of nicotine dependence: Secondary | ICD-10-CM | POA: Diagnosis not present

## 2020-09-11 DIAGNOSIS — Z8673 Personal history of transient ischemic attack (TIA), and cerebral infarction without residual deficits: Secondary | ICD-10-CM | POA: Diagnosis not present

## 2020-09-11 DIAGNOSIS — Z794 Long term (current) use of insulin: Secondary | ICD-10-CM | POA: Diagnosis not present

## 2020-09-11 DIAGNOSIS — S46011D Strain of muscle(s) and tendon(s) of the rotator cuff of right shoulder, subsequent encounter: Secondary | ICD-10-CM | POA: Diagnosis not present

## 2020-09-11 DIAGNOSIS — Z96611 Presence of right artificial shoulder joint: Secondary | ICD-10-CM | POA: Diagnosis not present

## 2020-09-11 DIAGNOSIS — K219 Gastro-esophageal reflux disease without esophagitis: Secondary | ICD-10-CM | POA: Diagnosis not present

## 2020-09-11 DIAGNOSIS — D649 Anemia, unspecified: Secondary | ICD-10-CM | POA: Diagnosis not present

## 2020-09-11 DIAGNOSIS — Z7982 Long term (current) use of aspirin: Secondary | ICD-10-CM | POA: Diagnosis not present

## 2020-09-11 DIAGNOSIS — Z96653 Presence of artificial knee joint, bilateral: Secondary | ICD-10-CM | POA: Diagnosis not present

## 2020-09-11 DIAGNOSIS — I10 Essential (primary) hypertension: Secondary | ICD-10-CM | POA: Diagnosis not present

## 2020-09-11 DIAGNOSIS — E119 Type 2 diabetes mellitus without complications: Secondary | ICD-10-CM | POA: Diagnosis not present

## 2020-09-11 DIAGNOSIS — Z9181 History of falling: Secondary | ICD-10-CM | POA: Diagnosis not present

## 2020-09-16 DIAGNOSIS — Z794 Long term (current) use of insulin: Secondary | ICD-10-CM | POA: Diagnosis not present

## 2020-09-16 DIAGNOSIS — J449 Chronic obstructive pulmonary disease, unspecified: Secondary | ICD-10-CM | POA: Diagnosis not present

## 2020-09-16 DIAGNOSIS — Z79899 Other long term (current) drug therapy: Secondary | ICD-10-CM | POA: Diagnosis not present

## 2020-09-16 DIAGNOSIS — Z7982 Long term (current) use of aspirin: Secondary | ICD-10-CM | POA: Diagnosis not present

## 2020-09-16 DIAGNOSIS — D649 Anemia, unspecified: Secondary | ICD-10-CM | POA: Diagnosis not present

## 2020-09-16 DIAGNOSIS — Z87891 Personal history of nicotine dependence: Secondary | ICD-10-CM | POA: Diagnosis not present

## 2020-09-16 DIAGNOSIS — K219 Gastro-esophageal reflux disease without esophagitis: Secondary | ICD-10-CM | POA: Diagnosis not present

## 2020-09-16 DIAGNOSIS — E039 Hypothyroidism, unspecified: Secondary | ICD-10-CM | POA: Diagnosis not present

## 2020-09-16 DIAGNOSIS — E785 Hyperlipidemia, unspecified: Secondary | ICD-10-CM | POA: Diagnosis not present

## 2020-09-16 DIAGNOSIS — Z96611 Presence of right artificial shoulder joint: Secondary | ICD-10-CM | POA: Diagnosis not present

## 2020-09-16 DIAGNOSIS — I10 Essential (primary) hypertension: Secondary | ICD-10-CM | POA: Diagnosis not present

## 2020-09-16 DIAGNOSIS — Z96653 Presence of artificial knee joint, bilateral: Secondary | ICD-10-CM | POA: Diagnosis not present

## 2020-09-16 DIAGNOSIS — Z8673 Personal history of transient ischemic attack (TIA), and cerebral infarction without residual deficits: Secondary | ICD-10-CM | POA: Diagnosis not present

## 2020-09-16 DIAGNOSIS — E119 Type 2 diabetes mellitus without complications: Secondary | ICD-10-CM | POA: Diagnosis not present

## 2020-09-16 DIAGNOSIS — Z9181 History of falling: Secondary | ICD-10-CM | POA: Diagnosis not present

## 2020-09-16 DIAGNOSIS — S46011D Strain of muscle(s) and tendon(s) of the rotator cuff of right shoulder, subsequent encounter: Secondary | ICD-10-CM | POA: Diagnosis not present

## 2020-09-18 DIAGNOSIS — E119 Type 2 diabetes mellitus without complications: Secondary | ICD-10-CM | POA: Diagnosis not present

## 2020-09-18 DIAGNOSIS — Z8673 Personal history of transient ischemic attack (TIA), and cerebral infarction without residual deficits: Secondary | ICD-10-CM | POA: Diagnosis not present

## 2020-09-18 DIAGNOSIS — Z7982 Long term (current) use of aspirin: Secondary | ICD-10-CM | POA: Diagnosis not present

## 2020-09-18 DIAGNOSIS — Z87891 Personal history of nicotine dependence: Secondary | ICD-10-CM | POA: Diagnosis not present

## 2020-09-18 DIAGNOSIS — E039 Hypothyroidism, unspecified: Secondary | ICD-10-CM | POA: Diagnosis not present

## 2020-09-18 DIAGNOSIS — E785 Hyperlipidemia, unspecified: Secondary | ICD-10-CM | POA: Diagnosis not present

## 2020-09-18 DIAGNOSIS — Z9181 History of falling: Secondary | ICD-10-CM | POA: Diagnosis not present

## 2020-09-18 DIAGNOSIS — K219 Gastro-esophageal reflux disease without esophagitis: Secondary | ICD-10-CM | POA: Diagnosis not present

## 2020-09-18 DIAGNOSIS — Z96653 Presence of artificial knee joint, bilateral: Secondary | ICD-10-CM | POA: Diagnosis not present

## 2020-09-18 DIAGNOSIS — S46011D Strain of muscle(s) and tendon(s) of the rotator cuff of right shoulder, subsequent encounter: Secondary | ICD-10-CM | POA: Diagnosis not present

## 2020-09-18 DIAGNOSIS — Z96611 Presence of right artificial shoulder joint: Secondary | ICD-10-CM | POA: Diagnosis not present

## 2020-09-18 DIAGNOSIS — D649 Anemia, unspecified: Secondary | ICD-10-CM | POA: Diagnosis not present

## 2020-09-18 DIAGNOSIS — Z794 Long term (current) use of insulin: Secondary | ICD-10-CM | POA: Diagnosis not present

## 2020-09-18 DIAGNOSIS — I10 Essential (primary) hypertension: Secondary | ICD-10-CM | POA: Diagnosis not present

## 2020-09-18 DIAGNOSIS — Z79899 Other long term (current) drug therapy: Secondary | ICD-10-CM | POA: Diagnosis not present

## 2020-09-18 DIAGNOSIS — J449 Chronic obstructive pulmonary disease, unspecified: Secondary | ICD-10-CM | POA: Diagnosis not present

## 2020-09-19 DIAGNOSIS — M25511 Pain in right shoulder: Secondary | ICD-10-CM | POA: Diagnosis not present

## 2020-09-19 DIAGNOSIS — Z96611 Presence of right artificial shoulder joint: Secondary | ICD-10-CM | POA: Diagnosis not present

## 2020-09-19 DIAGNOSIS — M6281 Muscle weakness (generalized): Secondary | ICD-10-CM | POA: Diagnosis not present

## 2020-09-19 DIAGNOSIS — M25611 Stiffness of right shoulder, not elsewhere classified: Secondary | ICD-10-CM | POA: Diagnosis not present

## 2020-09-21 DIAGNOSIS — Z20822 Contact with and (suspected) exposure to covid-19: Secondary | ICD-10-CM | POA: Diagnosis not present

## 2020-09-25 DIAGNOSIS — M25511 Pain in right shoulder: Secondary | ICD-10-CM | POA: Diagnosis not present

## 2020-09-25 DIAGNOSIS — E119 Type 2 diabetes mellitus without complications: Secondary | ICD-10-CM | POA: Diagnosis not present

## 2020-09-25 DIAGNOSIS — Z96611 Presence of right artificial shoulder joint: Secondary | ICD-10-CM | POA: Diagnosis not present

## 2020-09-25 LAB — HEMOGLOBIN A1C
Hemoglobin A1C: 5.5
Hemoglobin A1C: 5.7

## 2020-09-25 LAB — BASIC METABOLIC PANEL
BUN: 28 — AB (ref 4–21)
Creatinine: 1 (ref 0.5–1.1)

## 2020-09-25 LAB — HEPATIC FUNCTION PANEL
ALT: 15 (ref 7–35)
AST: 22 (ref 13–35)

## 2020-09-25 LAB — TSH
TSH: 2.2 (ref 0.41–5.90)
TSH: 2.29 (ref <=5.90)

## 2020-09-26 DIAGNOSIS — E119 Type 2 diabetes mellitus without complications: Secondary | ICD-10-CM | POA: Diagnosis not present

## 2020-09-26 DIAGNOSIS — E782 Mixed hyperlipidemia: Secondary | ICD-10-CM | POA: Diagnosis not present

## 2020-09-26 DIAGNOSIS — I10 Essential (primary) hypertension: Secondary | ICD-10-CM | POA: Diagnosis not present

## 2020-09-26 DIAGNOSIS — E063 Autoimmune thyroiditis: Secondary | ICD-10-CM | POA: Diagnosis not present

## 2020-09-28 ENCOUNTER — Other Ambulatory Visit: Payer: Self-pay | Admitting: Family Medicine

## 2020-09-28 DIAGNOSIS — E1159 Type 2 diabetes mellitus with other circulatory complications: Secondary | ICD-10-CM

## 2020-09-28 DIAGNOSIS — Z794 Long term (current) use of insulin: Secondary | ICD-10-CM

## 2020-10-02 DIAGNOSIS — Z96611 Presence of right artificial shoulder joint: Secondary | ICD-10-CM | POA: Diagnosis not present

## 2020-10-02 DIAGNOSIS — M25511 Pain in right shoulder: Secondary | ICD-10-CM | POA: Diagnosis not present

## 2020-10-03 ENCOUNTER — Ambulatory Visit: Payer: Medicare Other | Admitting: Family Medicine

## 2020-10-10 DIAGNOSIS — Z96611 Presence of right artificial shoulder joint: Secondary | ICD-10-CM | POA: Diagnosis not present

## 2020-10-10 DIAGNOSIS — M25511 Pain in right shoulder: Secondary | ICD-10-CM | POA: Diagnosis not present

## 2020-10-15 DIAGNOSIS — M25511 Pain in right shoulder: Secondary | ICD-10-CM | POA: Diagnosis not present

## 2020-10-15 DIAGNOSIS — Z96611 Presence of right artificial shoulder joint: Secondary | ICD-10-CM | POA: Diagnosis not present

## 2020-10-17 ENCOUNTER — Telehealth: Payer: Self-pay | Admitting: Family Medicine

## 2020-10-17 DIAGNOSIS — S42151A Displaced fracture of neck of scapula, right shoulder, initial encounter for closed fracture: Secondary | ICD-10-CM | POA: Insufficient documentation

## 2020-10-17 DIAGNOSIS — Z96611 Presence of right artificial shoulder joint: Secondary | ICD-10-CM | POA: Diagnosis not present

## 2020-10-17 NOTE — Telephone Encounter (Signed)
Copied from [place] [phone]. Topic: Medicare AWV >> Oct 17, 2020  2:19 PM Cher Nakai R wrote: Reason for CRM No answer unable to leave a message for patient to call back and schedule Medicare Annual Wellness Visit (AWV) in office.   If unable to come into the office for AWV,  please offer to do virtually or by telephone.  No hx of AWV eligible for AWVI as of 05/10/2009  Please schedule at anytime with [facility] Health Advisor.      40 Minutes appointment   Any questions, please call me at [phone]

## 2020-10-18 ENCOUNTER — Other Ambulatory Visit: Payer: Self-pay | Admitting: Family Medicine

## 2020-10-18 DIAGNOSIS — D509 Iron deficiency anemia, unspecified: Secondary | ICD-10-CM

## 2020-10-20 DIAGNOSIS — Z96611 Presence of right artificial shoulder joint: Secondary | ICD-10-CM | POA: Diagnosis not present

## 2020-10-20 DIAGNOSIS — M25511 Pain in right shoulder: Secondary | ICD-10-CM | POA: Diagnosis not present

## 2020-10-20 MED ORDER — FERROUS SULFATE 325 (65 FE) MG PO TABS: 325.0000 mg | ORAL_TABLET | Freq: Every day | ORAL | 0 refills | Status: DC

## 2020-10-21 ENCOUNTER — Telehealth: Payer: Self-pay | Admitting: Pharmacist

## 2020-10-21 DIAGNOSIS — Z9229 Personal history of other drug therapy: Secondary | ICD-10-CM

## 2020-10-21 NOTE — Progress Notes (Signed)
[place] [facility])                                            [place] Team                                        Statin Quality Measure Assessment    10/21/2020  Ednamae Schiano [birth year] [id number]  Per review of chart and payor information, patient has a diagnosis of diabetes but is not currently filling a statin prescription.  This places patient into the SUPD (Statin Use In Patients with Diabetes) measure for CMS.    Patient has not had simvastatin filled this year If deemed therapeutically appropriate, please review the patient's statin medication regimen at her upcoming appointment on 10/24/20.  The 10-year ASCVD risk score Mikey Bussing DC Brooke Bonito., et al., 2013) is: 32.2%   Values used to calculate the score:     Age: 75 years     Sex: Female     Is Non-Hispanic African American: No     Diabetic: Yes     Tobacco smoker: No     Systolic Blood Pressure: 101 mmHg     Is BP treated: Yes     HDL Cholesterol: 43 mg/dL     Total Cholesterol: 182 mg/dL 04/28/2020  To close the measure, the patient has to pick up /fill a 90 day supply of a statin or have a statin exclusion code associated with a visit.     Component Value Date/Time   CHOL 182 04/28/2020 1100   TRIG 241 (H) 04/28/2020 1100   HDL 43 04/28/2020 1100   LDLCALC 98 04/28/2020 1100    Please consider ONE of the following recommendations:  Initiate high intensity statin Atorvastatin 40mg  once daily, #90, 3 refills   Rosuvastatin 20mg  once daily, #90, 3 refills    Initiate moderate intensity          statin with reduced frequency if prior          statin intolerance 1x weekly, #13, 3 refills   2x weekly, #26, 3 refills   3x weekly, #39, 3 refills    Code for past statin intolerance or  other exclusions (required annually)   Provider Requirements: Associate code during an office visit or telehealth encounter  Drug Induced Myopathy G72.0   Myopathy, unspecified  G72.9   Myositis, unspecified M60.9   Rhabdomyolysis B51.02   Alcoholic fatty liver H85.2   Cirrhosis of liver K74.69   Prediabetes R73.03   PCOS E28.2   Toxic liver disease, unspecified K71.9   Adverse effect of antihyperlipidemic and antiarteriosclerotic drugs, initial encounter Keya Paha, PharmD, [place] Pharmacist [phone]

## 2020-10-23 ENCOUNTER — Other Ambulatory Visit: Payer: Self-pay | Admitting: Family Medicine

## 2020-10-23 DIAGNOSIS — E1159 Type 2 diabetes mellitus with other circulatory complications: Secondary | ICD-10-CM

## 2020-10-23 DIAGNOSIS — Z96611 Presence of right artificial shoulder joint: Secondary | ICD-10-CM | POA: Diagnosis not present

## 2020-10-23 DIAGNOSIS — M25511 Pain in right shoulder: Secondary | ICD-10-CM | POA: Diagnosis not present

## 2020-10-24 ENCOUNTER — Ambulatory Visit (INDEPENDENT_AMBULATORY_CARE_PROVIDER_SITE_OTHER): Payer: Medicare Other | Admitting: Family Medicine

## 2020-10-24 ENCOUNTER — Other Ambulatory Visit: Payer: Self-pay

## 2020-10-24 ENCOUNTER — Encounter: Payer: Self-pay | Admitting: Family Medicine

## 2020-10-24 VITALS — BP 130/76 | HR 74 | Ht 64.5 in | Wt 196.0 lb

## 2020-10-24 DIAGNOSIS — I10 Essential (primary) hypertension: Secondary | ICD-10-CM | POA: Diagnosis not present

## 2020-10-24 DIAGNOSIS — I872 Venous insufficiency (chronic) (peripheral): Secondary | ICD-10-CM | POA: Diagnosis not present

## 2020-10-24 DIAGNOSIS — I739 Peripheral vascular disease, unspecified: Secondary | ICD-10-CM

## 2020-10-24 DIAGNOSIS — R413 Other amnesia: Secondary | ICD-10-CM | POA: Diagnosis not present

## 2020-10-24 DIAGNOSIS — E034 Atrophy of thyroid (acquired): Secondary | ICD-10-CM

## 2020-10-24 DIAGNOSIS — E782 Mixed hyperlipidemia: Secondary | ICD-10-CM | POA: Diagnosis not present

## 2020-10-24 MED ORDER — FENOFIBRATE 145 MG PO TABS: 145.0000 mg | ORAL_TABLET | Freq: Every day | ORAL | 1 refills | Status: DC

## 2020-10-24 MED ORDER — SIMVASTATIN 20 MG PO TABS: 20.0000 mg | ORAL_TABLET | Freq: Every day | ORAL | 1 refills | Status: DC

## 2020-10-24 MED ORDER — METOPROLOL SUCCINATE ER 100 MG PO TB24
100.0000 mg | ORAL_TABLET | Freq: Every day | ORAL | 1 refills | Status: DC
Start: 2020-10-24 — End: 2021-04-28

## 2020-10-24 MED ORDER — LEVOTHYROXINE SODIUM 175 MCG PO TABS: 175.0000 ug | ORAL_TABLET | Freq: Every day | ORAL | 1 refills | Status: DC

## 2020-10-24 MED ORDER — LISINOPRIL 5 MG PO TABS: 5.0000 mg | ORAL_TABLET | Freq: Every day | ORAL | 1 refills | Status: DC

## 2020-10-24 NOTE — Progress Notes (Signed)
Date:  10/24/2020   Name:  Angela Fernandez   DOB:  [birth year]   MRN:  [id number]   Chief Complaint: Hypertension, Hyperlipidemia, and Hypothyroidism  Hypertension This is a chronic problem. The current episode started more than 1 year ago. The problem has been waxing and waning since onset. The problem is controlled. Pertinent negatives include no anxiety, blurred vision, chest pain, headaches, malaise/fatigue, neck pain, orthopnea, palpitations, peripheral edema, PND, shortness of breath or sweats. The current treatment provides moderate improvement. There are no compliance problems.  There is no history of angina, kidney disease, CAD/MI, CVA, heart failure, left ventricular hypertrophy, PVD or retinopathy. There is no history of chronic renal disease, a hypertension causing med or renovascular disease.  Hyperlipidemia She has no history of chronic renal disease. Pertinent negatives include no chest pain, myalgias or shortness of breath. Current antihyperlipidemic treatment includes statins.   Lab Results  Component Value Date   CREATININE 1.0 09/25/2020   BUN 28 (A) 09/25/2020   NA 134 (L) 09/05/2020   K 4.1 09/05/2020   CL 98 09/05/2020   CO2 26 09/05/2020   Lab Results  Component Value Date   CHOL 182 04/28/2020   HDL 43 04/28/2020   LDLCALC 98 04/28/2020   TRIG 241 (H) 04/28/2020   Lab Results  Component Value Date   TSH 2.20 09/25/2020   Lab Results  Component Value Date   HGBA1C 5.5 09/25/2020   Lab Results  Component Value Date   WBC 13.7 (H) 09/04/2020   HGB 10.7 (L) 09/04/2020   HCT 33.0 (L) 09/04/2020   MCV 97.1 09/04/2020   PLT 269 09/04/2020   Lab Results  Component Value Date   ALT 19 08/22/2020   AST 23 08/22/2020   ALKPHOS 19 (L) 08/22/2020   BILITOT 0.5 08/22/2020     Review of Systems  Constitutional:  Negative for chills, fever and malaise/fatigue.  HENT:  Negative for drooling, ear discharge, ear pain and sore throat.   Eyes:  Negative for  blurred vision.  Respiratory:  Negative for cough, shortness of breath and wheezing.   Cardiovascular:  Negative for chest pain, palpitations, orthopnea, leg swelling and PND.  Gastrointestinal:  Negative for abdominal pain, blood in stool, constipation, diarrhea and nausea.  Endocrine: Negative for polydipsia.  Genitourinary:  Negative for dysuria, frequency, hematuria and urgency.  Musculoskeletal:  Negative for back pain, myalgias and neck pain.  Skin:  Negative for rash.  Allergic/Immunologic: Negative for environmental allergies.  Neurological:  Negative for dizziness and headaches.  Hematological:  Does not bruise/bleed easily.  Psychiatric/Behavioral:  Negative for suicidal ideas. The patient is not nervous/anxious.    Patient Active Problem List   Diagnosis Date Noted   Status post reverse arthroplasty of shoulder, right 09/02/2020    No Known Allergies  Past Surgical History:  Procedure Laterality Date   BILATERAL CARPAL TUNNEL RELEASE Bilateral    BREAST SURGERY Right    lumpectomy   CATARACT EXTRACTION W/ INTRAOCULAR LENS  IMPLANT, BILATERAL Bilateral    CHOLECYSTECTOMY     COLONOSCOPY     ESOPHAGOGASTRODUODENOSCOPY     EYE SURGERY     JOINT REPLACEMENT     NASAL SINUS SURGERY     REPLACEMENT TOTAL KNEE BILATERAL Bilateral    REVERSE SHOULDER ARTHROPLASTY Right 09/02/2020   Procedure: REVERSE SHOULDER ARTHROPLASTY;  Surgeon: Corky Mull, MD;  Location: [facility] ORS;  Service: Orthopedics;  Laterality: Right;   SHOULDER SURGERY Right 2020   rotator cuff  repair    Social History   Tobacco Use   Smoking status: Former    Years: 54.00    Pack years: 0.00    Types: Cigarettes    Quit date: 05/10/2016    Years since quitting: 4.4   Smokeless tobacco: Never  Vaping Use   Vaping Use: Never used  Substance Use Topics   Alcohol use: Not Currently    Comment: quit in age 25's or 61's   Drug use: Not Currently    Types: Cocaine, Heroin, Marijuana, LSD    Comment:  last used in age 70's     Medication list has been reviewed and updated.  Current Meds  Medication Sig   acetaminophen (TYLENOL) 325 MG tablet Take 650 mg by mouth every 4 (four) hours as needed.   albuterol (VENTOLIN HFA) 108 (90 Base) MCG/ACT inhaler Inhale 2 puffs into the lungs every 6 (six) hours as needed for wheezing or shortness of breath.   buPROPion (WELLBUTRIN SR) 200 MG 12 hr tablet Take 1 tablet (200 mg total) by mouth 2 (two) times daily. Take 1 bid   clonazePAM (KLONOPIN) 1 MG tablet Take 1.5 mg by mouth daily. Take 1.5 tabs in the am, 1 tabs at noon, and 1 tab at bedtime/ psych   cyclobenzaprine (FLEXERIL) 10 MG tablet Take 1 tablet (10 mg total) by mouth at bedtime.   desvenlafaxine (PRISTIQ) 50 MG 24 hr tablet Take 50 mg by mouth at bedtime.   docusate sodium (COLACE) 100 MG capsule Take 200 mg by mouth 2 (two) times daily.   fenofibrate (TRICOR) 145 MG tablet Take 1 tablet (145 mg total) by mouth daily. (Patient taking differently: Take 145 mg by mouth daily. 4pm)   ferrous sulfate 325 (65 FE) MG tablet Take 1 tablet (325 mg total) by mouth daily with breakfast.   Glucagon, rDNA, (GLUCAGON EMERGENCY) 1 MG KIT Inject 1 Device as directed as needed (hypoglycemia).   insulin glargine (LANTUS SOLOSTAR) 100 UNIT/ML Solostar Pen Inject 30 Units into the skin daily. 30 units daily into skin (Patient taking differently: Inject 24 Units into the skin daily. 30 units daily into skin)   lactulose (CHRONULAC) 10 GM/15ML solution Take 10 g by mouth daily as needed.   levothyroxine (SYNTHROID) 175 MCG tablet Take 1 tablet (175 mcg total) by mouth daily before breakfast.   lisinopril (ZESTRIL) 5 MG tablet Take 1 tablet (5 mg total) by mouth daily.   Lurasidone HCl (LATUDA) 120 MG TABS Take 1 tablet by mouth daily. Psych Takes at 4 pm   meloxicam (MOBIC) 7.5 MG tablet Take 1 tablet (7.5 mg total) by mouth daily. (Patient taking differently: Take 7.5 mg by mouth daily. May take another dose  in the afternoon)   metoprolol succinate (TOPROL-XL) 100 MG 24 hr tablet Take 1 tablet (100 mg total) by mouth daily. Take with or immediately following a meal.   Multiple Vitamins-Minerals (HAIR SKIN AND NAILS FORMULA PO) Take 1 tablet by mouth at bedtime.   Multiple Vitamins-Minerals (PRESERVISION AREDS PO) Take 1 capsule by mouth in the morning and at bedtime.   omeprazole (PRILOSEC) 40 MG capsule Take 1 capsule by mouth once daily   OVER THE COUNTER MEDICATION Take 1 Scoop by mouth daily in the afternoon. Collagen protein powder   simvastatin (ZOCOR) 20 MG tablet Take 1 tablet (20 mg total) by mouth daily. (Patient taking differently: Take 20 mg by mouth at bedtime.)   traZODone (DESYREL) 50 MG tablet Take 50 mg by  mouth at bedtime. Take one tablet at bedtime/ psych   TRULICITY 3.78 HY/8.5OY SOPN INJECT ONE SYRINGEFUL SUBCUTANEOUSLY ONCE WEEKLY   umeclidinium-vilanterol (ANORO ELLIPTA) 62.5-25 MCG/INH AEPB Inhale 1 puff into the lungs daily.    PHQ 2/9 Scores 10/24/2020 02/19/2020  PHQ - 2 Score 1 3  PHQ- 9 Score 6 8    GAD 7 : Generalized Anxiety Score 10/24/2020 02/19/2020  Nervous, Anxious, on Edge 2 0  Control/stop worrying 1 1  Worry too much - different things 0 0  Trouble relaxing 0 0  Restless 0 0  Easily annoyed or irritable 0 0  Afraid - awful might happen 0 0  Total GAD 7 Score 3 1  Anxiety Difficulty Not difficult at all Somewhat difficult    BP Readings from Last 3 Encounters:  10/24/20 130/76  09/10/20 100/62  09/06/20 129/71    Physical Exam Vitals and nursing note reviewed.  Constitutional:      Appearance: She is well-developed.  HENT:     Head: Normocephalic.     Right Ear: Tympanic membrane, ear canal and external ear normal.     Left Ear: Tympanic membrane, ear canal and external ear normal.     Nose: Nose normal. No congestion or rhinorrhea.  Eyes:     General: Lids are everted, no foreign bodies appreciated. No scleral icterus.       Left eye: No  foreign body or hordeolum.     Conjunctiva/sclera: Conjunctivae normal.     Right eye: Right conjunctiva is not injected.     Left eye: Left conjunctiva is not injected.     Pupils: Pupils are equal, round, and reactive to light.  Neck:     Thyroid: No thyromegaly.     Vascular: No JVD.     Trachea: No tracheal deviation.  Cardiovascular:     Rate and Rhythm: Normal rate and regular rhythm.     Pulses:          Dorsalis pedis pulses are 1+ on the right side and 1+ on the left side.       Posterior tibial pulses are 1+ on the right side and 1+ on the left side.     Heart sounds: Normal heart sounds, S1 normal and S2 normal. No murmur heard. No systolic murmur is present.  No diastolic murmur is present.    No friction rub. No gallop. No S3 or S4 sounds.  Pulmonary:     Effort: Pulmonary effort is normal. No respiratory distress.     Breath sounds: Normal breath sounds. No wheezing, rhonchi or rales.  Abdominal:     General: Bowel sounds are normal.     Palpations: Abdomen is soft. There is no mass.     Tenderness: There is no abdominal tenderness. There is no guarding or rebound.  Musculoskeletal:        General: No tenderness. Normal range of motion.     Cervical back: Normal range of motion and neck supple.     Right lower leg: 1+ Pitting Edema present.     Left lower leg: 1+ Pitting Edema present.  Lymphadenopathy:     Cervical: No cervical adenopathy.  Skin:    General: Skin is warm.     Findings: No rash.  Neurological:     Mental Status: She is alert and oriented to person, place, and time.     Cranial Nerves: Cranial nerves are intact. No cranial nerve deficit.     Sensory: Sensation is  intact.     Motor: Motor function is intact.     Deep Tendon Reflexes: Reflexes normal.  Psychiatric:        Mood and Affect: Mood is not anxious or depressed.    Wt Readings from Last 3 Encounters:  10/24/20 196 lb (88.9 kg)  09/10/20 198 lb (89.8 kg)  09/02/20 199 lb 15.3 oz  (90.7 kg)    BP 130/76   Pulse 74   Ht 5' 4.5" (1.638 m)   Wt 196 lb (88.9 kg)   BMI 33.12 kg/m   Assessment and Plan:  1. Hypothyroidism due to acquired atrophy of thyroid Chronic.  Controlled.  Stable.  Continue levothyroxine 175 mcg daily. - levothyroxine (SYNTHROID) 175 MCG tablet; Take 1 tablet (175 mcg total) by mouth daily before breakfast.  Dispense: 90 tablet; Refill: 1  2. Mixed hyperlipidemia .  Controlled.  Stable.  Continue fenofibrate 145 mg and simvastatin 20 mg once a day. - fenofibrate (TRICOR) 145 MG tablet; Take 1 tablet (145 mg total) by mouth daily. 4pm  Dispense: 90 tablet; Refill: 1 - simvastatin (ZOCOR) 20 MG tablet; Take 1 tablet (20 mg total) by mouth daily.  Dispense: 90 tablet; Refill: 1  3. Primary hypertension Chronic.  Controlled.  Stable.  Blood pressure is 130/76.  We will continue lisinopril 5 mg once a day metoprolol XL 100 mg once a day. - lisinopril (ZESTRIL) 5 MG tablet; Take 1 tablet (5 mg total) by mouth daily.  Dispense: 90 tablet; Refill: 1 - metoprolol succinate (TOPROL-XL) 100 MG 24 hr tablet; Take 1 tablet (100 mg total) by mouth daily. Take with or immediately following a meal.  Dispense: 90 tablet; Refill: 1  4. Memory changes New onset.  Episodic.  Patient has had circumstances that she is repeating questions and may have some relatively recent memory loss.  Family member would like to have him evaluated by neurology for early dementia.  We will refer there for - Ambulatory referral to Neurology  5. Venous insufficiency Chronic.  Controlled.  Stable.  Patient does have 1+ edema which improves with elevation at night.  We will refer to vein and vascular for evaluation. - Ambulatory referral to Vascular Surgery  6. Claudication of left lower extremity (HCC) Chronic.  New onset of symptoms that patient is awakening with left pain of the lower leg which is relieved by lowering the leg in a dependent position.  Although there is a 1+  dorsalis pedis posterior tibial there is concern for a vascular concern and will refer to vascular for evaluation - Ambulatory referral to Vascular Surgery

## 2020-10-27 DIAGNOSIS — M25511 Pain in right shoulder: Secondary | ICD-10-CM | POA: Diagnosis not present

## 2020-10-27 DIAGNOSIS — Z96611 Presence of right artificial shoulder joint: Secondary | ICD-10-CM | POA: Diagnosis not present

## 2020-10-30 DIAGNOSIS — Z96611 Presence of right artificial shoulder joint: Secondary | ICD-10-CM | POA: Diagnosis not present

## 2020-10-30 DIAGNOSIS — M25511 Pain in right shoulder: Secondary | ICD-10-CM | POA: Diagnosis not present

## 2020-11-06 DIAGNOSIS — M25511 Pain in right shoulder: Secondary | ICD-10-CM | POA: Diagnosis not present

## 2020-11-06 DIAGNOSIS — Z96611 Presence of right artificial shoulder joint: Secondary | ICD-10-CM | POA: Diagnosis not present

## 2020-11-11 DIAGNOSIS — Z96611 Presence of right artificial shoulder joint: Secondary | ICD-10-CM | POA: Diagnosis not present

## 2020-11-11 DIAGNOSIS — M25511 Pain in right shoulder: Secondary | ICD-10-CM | POA: Diagnosis not present

## 2020-11-13 DIAGNOSIS — M25511 Pain in right shoulder: Secondary | ICD-10-CM | POA: Diagnosis not present

## 2020-11-13 DIAGNOSIS — Z96611 Presence of right artificial shoulder joint: Secondary | ICD-10-CM | POA: Diagnosis not present

## 2020-11-17 DIAGNOSIS — Z96611 Presence of right artificial shoulder joint: Secondary | ICD-10-CM | POA: Diagnosis not present

## 2020-11-17 DIAGNOSIS — M25511 Pain in right shoulder: Secondary | ICD-10-CM | POA: Diagnosis not present

## 2020-11-18 ENCOUNTER — Telehealth: Payer: Self-pay | Admitting: Family Medicine

## 2020-11-18 NOTE — Telephone Encounter (Signed)
Copied from [place] [id number] [phone]. Topic: Medicare AWV >> Nov 18, 2020 10:30 AM Cher Nakai R wrote: Reason for CRM:  Left message for patient to call back and schedule Medicare Annual Wellness Visit (AWV) in office.   If unable to come into the office for AWV,  please offer to do virtually or by telephone.  No hx of AWV eligible for AWVI as of 05/10/2009  Please schedule at anytime with [facility] Advisor.      40 Minutes appointment   Any questions, please call me at [phone]

## 2020-11-19 DIAGNOSIS — M25511 Pain in right shoulder: Secondary | ICD-10-CM | POA: Diagnosis not present

## 2020-11-19 DIAGNOSIS — Z96611 Presence of right artificial shoulder joint: Secondary | ICD-10-CM | POA: Diagnosis not present

## 2020-11-21 ENCOUNTER — Encounter (INDEPENDENT_AMBULATORY_CARE_PROVIDER_SITE_OTHER): Payer: Medicare Other | Admitting: Nurse Practitioner

## 2020-11-26 ENCOUNTER — Encounter: Payer: Self-pay | Admitting: Family Medicine

## 2020-11-27 ENCOUNTER — Telehealth: Payer: Self-pay

## 2020-11-27 ENCOUNTER — Encounter: Payer: Self-pay | Admitting: Family Medicine

## 2020-11-27 DIAGNOSIS — Z96611 Presence of right artificial shoulder joint: Secondary | ICD-10-CM | POA: Diagnosis not present

## 2020-11-27 DIAGNOSIS — M25511 Pain in right shoulder: Secondary | ICD-10-CM | POA: Diagnosis not present

## 2020-11-27 NOTE — Telephone Encounter (Signed)
Called [facility] and left the number for neurology. They have said they tried to call her and can't get answer

## 2020-11-28 ENCOUNTER — Other Ambulatory Visit (HOSPITAL_COMMUNITY): Payer: Self-pay | Admitting: Surgery

## 2020-11-28 ENCOUNTER — Other Ambulatory Visit: Payer: Self-pay | Admitting: Family Medicine

## 2020-11-28 ENCOUNTER — Other Ambulatory Visit: Payer: Self-pay | Admitting: Surgery

## 2020-11-28 DIAGNOSIS — Z96611 Presence of right artificial shoulder joint: Secondary | ICD-10-CM

## 2020-11-28 DIAGNOSIS — S42151D Displaced fracture of neck of scapula, right shoulder, subsequent encounter for fracture with routine healing: Secondary | ICD-10-CM

## 2020-11-28 DIAGNOSIS — E1159 Type 2 diabetes mellitus with other circulatory complications: Secondary | ICD-10-CM

## 2020-11-28 DIAGNOSIS — Z794 Long term (current) use of insulin: Secondary | ICD-10-CM

## 2020-12-01 ENCOUNTER — Telehealth: Payer: Self-pay

## 2020-12-01 DIAGNOSIS — M25511 Pain in right shoulder: Secondary | ICD-10-CM | POA: Diagnosis not present

## 2020-12-01 DIAGNOSIS — Z96611 Presence of right artificial shoulder joint: Secondary | ICD-10-CM | POA: Diagnosis not present

## 2020-12-01 NOTE — Telephone Encounter (Unsigned)
Copied from [place] [phone]. Topic: General - Other >> Dec 01, 2020 12:21 PM Leward Quan A wrote: Reason for CRM: Patient called in to inquire of Dr Ronnald Ramp about sending an Rx to her pharmacy say that she have a UTI, burning during urination, abdominal pressure, feeling of urgency with little output since 11/30/20. Patient states that she can not come to office for a urine sample since she have PT and does not have her own transportation to get around. Please call patient with questions ans concerns at  Ph# [phone]

## 2020-12-01 NOTE — Telephone Encounter (Signed)
Called patient and left her VM informing her she needs to call the office back to schedule an appt with Dr Ronnald Ramp. Told her Dr Ronnald Ramp cannot call in abx without seeing her first.

## 2020-12-01 NOTE — Telephone Encounter (Signed)
Pt returned office call. Assist pt with scheduling an apt with provider.

## 2020-12-02 ENCOUNTER — Encounter: Payer: Self-pay | Admitting: Emergency Medicine

## 2020-12-02 ENCOUNTER — Ambulatory Visit
Admission: EM | Admit: 2020-12-02 | Discharge: 2020-12-02 | Disposition: A | Discharge status: Home / Self Care | Payer: Medicare Other | Attending: Family Medicine | Admitting: Family Medicine

## 2020-12-02 ENCOUNTER — Other Ambulatory Visit: Payer: Self-pay

## 2020-12-02 ENCOUNTER — Ambulatory Visit: Payer: Self-pay

## 2020-12-02 ENCOUNTER — Other Ambulatory Visit: Payer: Self-pay | Admitting: Family Medicine

## 2020-12-02 DIAGNOSIS — N390 Urinary tract infection, site not specified: Secondary | ICD-10-CM

## 2020-12-02 DIAGNOSIS — Z794 Long term (current) use of insulin: Secondary | ICD-10-CM

## 2020-12-02 DIAGNOSIS — E1159 Type 2 diabetes mellitus with other circulatory complications: Secondary | ICD-10-CM

## 2020-12-02 LAB — POCT URINALYSIS DIP (MANUAL ENTRY)
Bilirubin, UA: NEGATIVE
Glucose, UA: NEGATIVE mg/dL
Ketones, POC UA: NEGATIVE mg/dL
Nitrite, UA: POSITIVE — AB
Protein Ur, POC: 30 mg/dL — AB
Spec Grav, UA: 1.02 (ref 1.010–1.025)
Urobilinogen, UA: 0.2 E.U./dL
pH, UA: 6 (ref 5.0–8.0)

## 2020-12-02 MED ORDER — CEPHALEXIN 500 MG PO CAPS: 500.0000 mg | ORAL_CAPSULE | Freq: Two times a day (BID) | ORAL | 0 refills | Status: AC

## 2020-12-02 MED ORDER — CEFTRIAXONE SODIUM 500 MG IJ SOLR
500.0000 mg | Freq: Once | INTRAMUSCULAR | Status: AC
  Administered 2020-12-02: 500 mg via INTRAMUSCULAR

## 2020-12-02 NOTE — Discharge Instructions (Addendum)
Start Keflex 500 mg twice daily and take daily for 10 days. If your urine culture shows the need to change your antibiotics we will call you. If you do not hear from our clinic, complete entire course of medication as prescribed.

## 2020-12-02 NOTE — ED Provider Notes (Signed)
Angela Fernandez    CSN: [id number] Arrival date & time: 12/02/20  1324      History   Chief Complaint Chief Complaint  Patient presents with   Urinary Frequency   Urinary Retention    HPI Angela Fernandez is a 75 y.o. female.   HPI Patient with a medical history of recurrent UTI and Type 2 Diabetes, presents with 3-4 days of dysuria, urine frequency, and pressure. Denies N&V, fever, abdominal pain or flank pain.    Past Medical History:  Diagnosis Date   Anemia    Bipolar affect, depressed ([place])    COPD (chronic obstructive pulmonary disease) ([place])    Diabetes mellitus without complication (HCC)    type II   GERD (gastroesophageal reflux disease)    Hyperlipidemia    Hypertension    Hypothyroidism    PTSD (post-traumatic stress disorder)    Schizoaffective disorder (HCC)    TIA (transient ischemic attack) 2010    Patient Active Problem List   Diagnosis Date Noted   Status post reverse arthroplasty of shoulder, right 09/02/2020    Past Surgical History:  Procedure Laterality Date   BILATERAL CARPAL TUNNEL RELEASE Bilateral    BREAST SURGERY Right    lumpectomy   CATARACT EXTRACTION W/ INTRAOCULAR LENS  IMPLANT, BILATERAL Bilateral    CHOLECYSTECTOMY     COLONOSCOPY     ESOPHAGOGASTRODUODENOSCOPY     EYE SURGERY     JOINT REPLACEMENT     NASAL SINUS SURGERY     REPLACEMENT TOTAL KNEE BILATERAL Bilateral    REVERSE SHOULDER ARTHROPLASTY Right 09/02/2020   Procedure: REVERSE SHOULDER ARTHROPLASTY;  Surgeon: Corky Mull, MD;  Location: [facility] ORS;  Service: Orthopedics;  Laterality: Right;   SHOULDER SURGERY Right 2020   rotator cuff repair    OB History   No obstetric history on file.      Home Medications    Prior to Admission medications   Medication Sig Start Date End Date Taking? Authorizing Provider  cephALEXin (KEFLEX) 500 MG capsule Take 1 capsule (500 mg total) by mouth 2 (two) times daily for 10 days. 12/02/20 12/12/20 Yes Scot Jun, FNP  insulin glargine (LANTUS SOLOSTAR) 100 UNIT/ML Solostar Pen Inject 30 Units into the skin daily. 30 units daily into skin Patient taking differently: Inject 24 Units into the skin daily. 30 units daily into skin 04/18/20  Yes Juline Patch, MD  levothyroxine (SYNTHROID) 175 MCG tablet Take 1 tablet (175 mcg total) by mouth daily before breakfast. 10/24/20  Yes Juline Patch, MD  lisinopril (ZESTRIL) 5 MG tablet Take 1 tablet (5 mg total) by mouth daily. 10/24/20  Yes Juline Patch, MD  metoprolol succinate (TOPROL-XL) 100 MG 24 hr tablet Take 1 tablet (100 mg total) by mouth daily. Take with or immediately following a meal. 10/24/20  Yes Juline Patch, MD  simvastatin (ZOCOR) 20 MG tablet Take 1 tablet (20 mg total) by mouth daily. 10/24/20  Yes Juline Patch, MD  TRULICITY 3.21 YY/4.8GN SOPN INJECT ONE SYRINGEFUL SUBCUTANEOUSLY ONCE WEEKLY 12/02/20  Yes Juline Patch, MD  acetaminophen (TYLENOL) 325 MG tablet Take 650 mg by mouth every 4 (four) hours as needed.    [provider]  albuterol (VENTOLIN HFA) 108 (90 Base) MCG/ACT inhaler Inhale 2 puffs into the lungs every 6 (six) hours as needed for wheezing or shortness of breath. 02/19/20   Juline Patch, MD  buPROPion (WELLBUTRIN SR) 200 MG 12 hr tablet Take  1 tablet (200 mg total) by mouth 2 (two) times daily. Take 1 bid    [provider]  clonazePAM (KLONOPIN) 1 MG tablet Take 1.5 mg by mouth daily. Take 1.5 tabs in the am, 1 tabs at noon, and 1 tab at bedtime/ psych    [provider]  cyclobenzaprine (FLEXERIL) 10 MG tablet Take 1 tablet (10 mg total) by mouth at bedtime. 09/06/20   Reche Dixon, PA-C  desvenlafaxine (PRISTIQ) 50 MG 24 hr tablet Take 50 mg by mouth at bedtime.    [provider]  docusate sodium (COLACE) 100 MG capsule Take 200 mg by mouth 2 (two) times daily.    [provider]  fenofibrate (TRICOR) 145 MG tablet Take 1 tablet (145 mg total) by mouth daily. 4pm  10/24/20   Juline Patch, MD  ferrous sulfate 325 (65 FE) MG tablet Take 1 tablet (325 mg total) by mouth daily with breakfast. 10/20/20   Glean Hess, MD  Glucagon, rDNA, (GLUCAGON EMERGENCY) 1 MG KIT Inject 1 Device as directed as needed (hypoglycemia). 04/18/20   Juline Patch, MD  lactulose (CHRONULAC) 10 GM/15ML solution Take 10 g by mouth daily as needed. 02/19/20   [provider]  Lurasidone HCl (LATUDA) 120 MG TABS Take 1 tablet by mouth daily. Psych Takes at 4 pm    [provider]  meloxicam (MOBIC) 7.5 MG tablet Take 1 tablet (7.5 mg total) by mouth daily. Patient taking differently: Take 7.5 mg by mouth daily. May take another dose in the afternoon 02/19/20   Juline Patch, MD  Multiple Vitamins-Minerals (HAIR SKIN AND NAILS FORMULA PO) Take 1 tablet by mouth at bedtime.    [provider]  Multiple Vitamins-Minerals (PRESERVISION AREDS PO) Take 1 capsule by mouth in the morning and at bedtime.    [provider]  omeprazole (PRILOSEC) 40 MG capsule Take 1 capsule by mouth once daily 07/13/20   Juline Patch, MD  OVER THE COUNTER MEDICATION Take 1 Scoop by mouth daily in the afternoon. Collagen protein powder    [provider]  Polyethyl Glycol-Propyl Glycol (SYSTANE OP) Apply 2 drops to eye 3 (three) times daily as needed. Patient not taking: Reported on 10/24/2020    [provider]  traMADol (ULTRAM) 50 MG tablet Take 1 tablet (50 mg total) by mouth every 6 (six) hours as needed for moderate pain. Patient not taking: No sig reported 09/06/20   Reche Dixon, PA-C  traZODone (DESYREL) 50 MG tablet Take 50 mg by mouth at bedtime. Take one tablet at bedtime/ psych    [provider]  umeclidinium-vilanterol (ANORO ELLIPTA) 62.5-25 MCG/INH AEPB Inhale 1 puff into the lungs daily.    [provider]  zolpidem (AMBIEN) 5 MG tablet Take 5 mg by mouth at bedtime. Patient not taking: No sig reported    [provider]    Family History Family History  Problem Relation Age of Onset   Pancreatitis Father     Social History Social History   Tobacco Use   Smoking status: Former    Years: 54.00    Types: Cigarettes    Quit date: 05/10/2016    Years since quitting: 4.5   Smokeless tobacco: Never  Vaping Use   Vaping Use: Never used  Substance Use Topics   Alcohol use: Not Currently    Comment: quit in age 75's or 23's   Drug use: Not Currently    Types: Cocaine, Heroin,  Marijuana, LSD    Comment: last used in age 71's     Allergies   Patient has no known allergies.   Review of Systems Review of Systems Pertinent negatives listed in HPI   Physical Exam Triage Vital Signs ED Triage Vitals  Enc Vitals Group     BP 12/02/20 1338 (!) 145/78     Pulse Rate 12/02/20 1338 71     Resp 12/02/20 1338 18     Temp 12/02/20 1338 97.6 F (36.4 C)     Temp Source 12/02/20 1338 Oral     SpO2 12/02/20 1338 97 %     Weight --      Height --      Head Circumference --      Peak Flow --      Pain Score 12/02/20 1335 6     Pain Loc --      Pain Edu? --      Excl. in [place]? --    No data found.  Updated Vital Signs BP (!) 145/78 (BP Location: Left Arm)   Pulse 71   Temp 97.6 F (36.4 C) (Oral)   Resp 18   SpO2 97%   Visual Acuity Right Eye Distance:   Left Eye Distance:   Bilateral Distance:    Right Eye Near:   Left Eye Near:    Bilateral Near:     Physical Exam General appearance: Alert, well developed, well nourished, cooperative and in no distress Head: Normocephalic, without obvious abnormality, atraumatic Respiratory: Respirations even and unlabored, normal respiratory rate Heart: rate and rhythm normal. No gallop or murmurs noted on exam  Abdomen: BS +, no distention, no rebound tenderness, or no CVA tenderness Skin: Skin color, texture, turgor normal. No rashes seen  Psych: Appropriate mood and affect. Neurologic: No focal neurological deficit    UC  Treatments / Results  Labs (all labs ordered are listed, but only abnormal results are displayed) Labs Reviewed  POCT URINALYSIS DIP (MANUAL ENTRY) - Abnormal; Notable for the following components:      Result Value   Clarity, UA cloudy (*)    Blood, UA moderate (*)    Protein Ur, POC =30 (*)    Nitrite, UA Positive (*)    Leukocytes, UA Moderate (2+) (*)    All other components within normal limits  URINE CULTURE    EKG   Radiology No results found.  Procedures Procedures (including critical care time)  Medications Ordered in UC Medications  cefTRIAXone (ROCEPHIN) injection 500 mg (has no administration in time range)    Initial Impression / Assessment and Plan / UC Course  I have reviewed the triage vital signs and the nursing notes.  Pertinent labs & imaging results that were available during my care of the patient were reviewed by me and considered in my medical decision making (see chart for details).      Acute UTI. Given length of symptoms and patient history of DM,  will cover with Rocephin to reduce risk o f urosepsis. UA abnormal and findings consistent with UTI. Empiric antibiotic treatment initiated. Encouraged increase intake of water.Urine culture pending.  ER if symptoms become severe. Follow-up with PCP if symptoms do not completely resolve.     Final Clinical Impressions(s) / UC Diagnoses   Final diagnoses:  Acute UTI (urinary tract infection)     Discharge Instructions      Start Keflex 500 mg twice daily and take daily for 10 days. If your urine  culture shows the need to change your antibiotics we will call you. If you do not hear from our clinic, complete entire course of medication as prescribed.   ED Prescriptions     Medication Sig Dispense Auth. Provider   cephALEXin (KEFLEX) 500 MG capsule Take 1 capsule (500 mg total) by mouth 2 (two) times daily for 10 days. 20 capsule Scot Jun, FNP      PDMP not reviewed this  encounter.   Scot Jun, FNP 12/02/20 1400

## 2020-12-02 NOTE — ED Triage Notes (Signed)
Patient c/o urinary frequency and retention x 4 days.   Patient denies "burning" and fever.   Patient endorses fatigue and chills.   Patient endorses back pain.   Patient endorses cloudy urine.   Patient endorses ABD pressure.   Patient hasn't taken any medications for symptoms.

## 2020-12-04 ENCOUNTER — Ambulatory Visit: Payer: Medicare Other | Admitting: Family Medicine

## 2020-12-04 LAB — URINE CULTURE
Culture: >=100000 — AB
Special Requests: NORMAL

## 2020-12-05 ENCOUNTER — Encounter (INDEPENDENT_AMBULATORY_CARE_PROVIDER_SITE_OTHER): Payer: Medicare Other | Admitting: Nurse Practitioner

## 2020-12-05 ENCOUNTER — Ambulatory Visit: Payer: Medicare Other | Admitting: Family Medicine

## 2020-12-09 ENCOUNTER — Ambulatory Visit
Admission: RE | Admit: 2020-12-09 | Discharge: 2020-12-09 | Disposition: A | Discharge status: Home / Self Care | Payer: Medicare Other | Source: Ambulatory Visit | Attending: Surgery | Admitting: Surgery

## 2020-12-09 ENCOUNTER — Other Ambulatory Visit: Payer: Self-pay

## 2020-12-09 ENCOUNTER — Ambulatory Visit: Payer: Medicare Other

## 2020-12-09 DIAGNOSIS — Z471 Aftercare following joint replacement surgery: Secondary | ICD-10-CM | POA: Diagnosis not present

## 2020-12-09 DIAGNOSIS — S42151D Displaced fracture of neck of scapula, right shoulder, subsequent encounter for fracture with routine healing: Secondary | ICD-10-CM | POA: Diagnosis not present

## 2020-12-09 DIAGNOSIS — Z96611 Presence of right artificial shoulder joint: Secondary | ICD-10-CM | POA: Diagnosis not present

## 2020-12-09 IMAGING — CT CT SHOULDER*R* W/O CM
1 series · 12 of 14 positions shown, 15 images · non-contrast
Comparison: CT scan [DATE]

CLINICAL DATA: Status post reversed total shoulder arthroplasty.
Closed displaced fracture of the scapular neck.

EXAM:
CT OF THE UPPER RIGHT EXTREMITY WITHOUT CONTRAST
TECHNIQUE: Multidetector CT imaging of the upper right extremity was performed
according to the standard protocol.

[Series 7: ax st · axial · 0.39mm/px · z∈[+162,+296]mm · 12 of 92 slices shown, 15 images]
[im 8/92  soft-tissue]
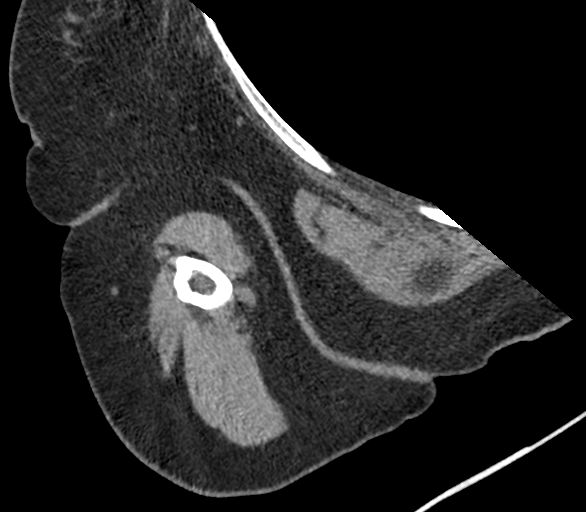
[im 8/92  bone]
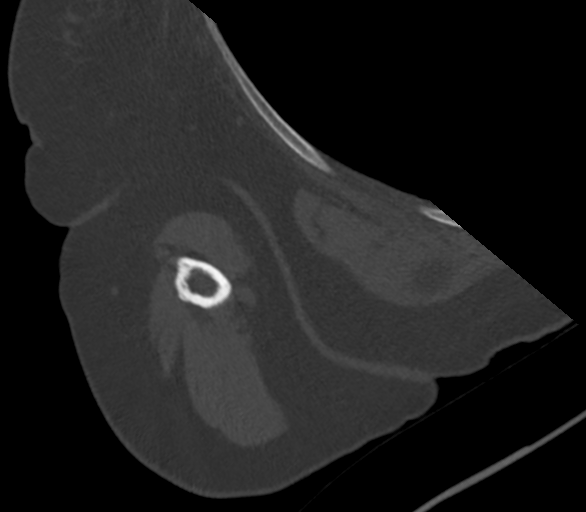
[im 15/92  bone]
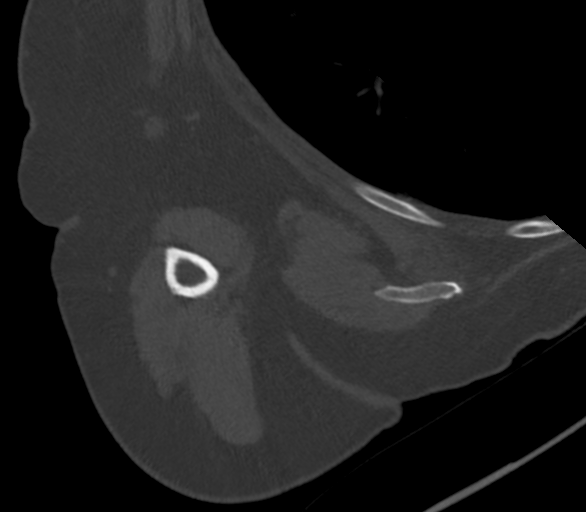
[im 22/92  bone]
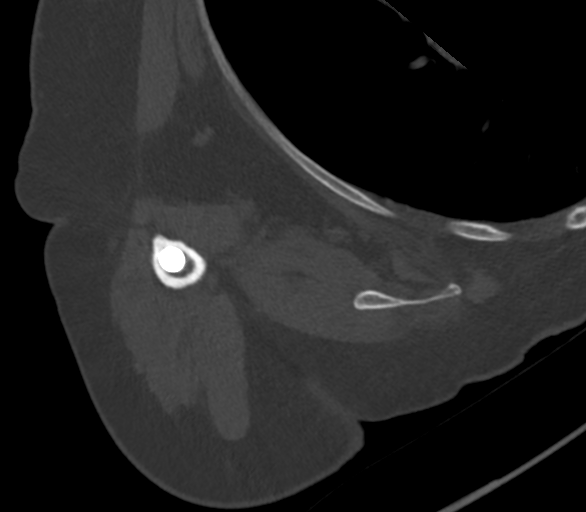
[im 29/92  bone]
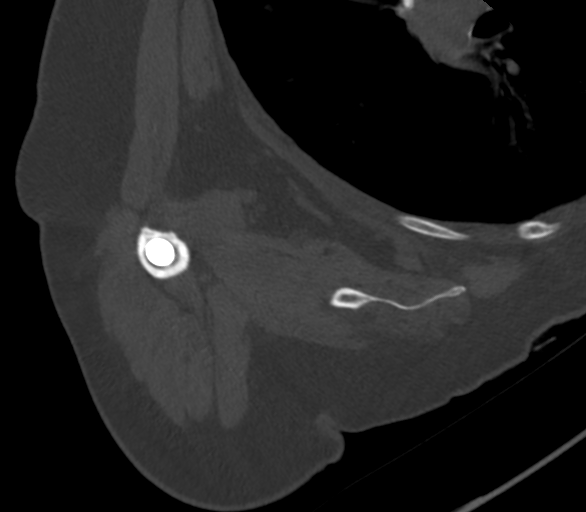
[im 36/92  soft-tissue]
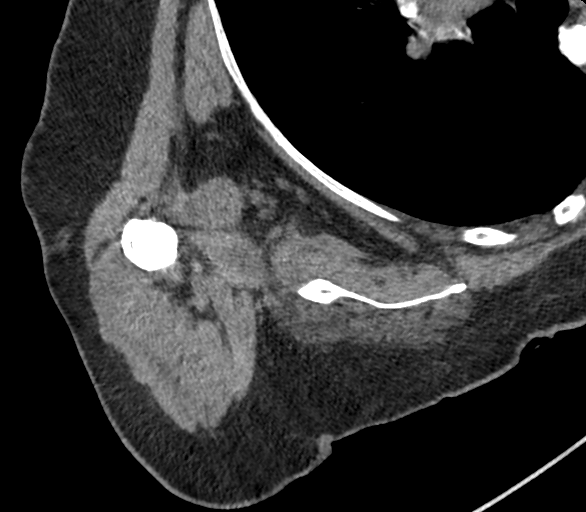
[im 36/92  bone]
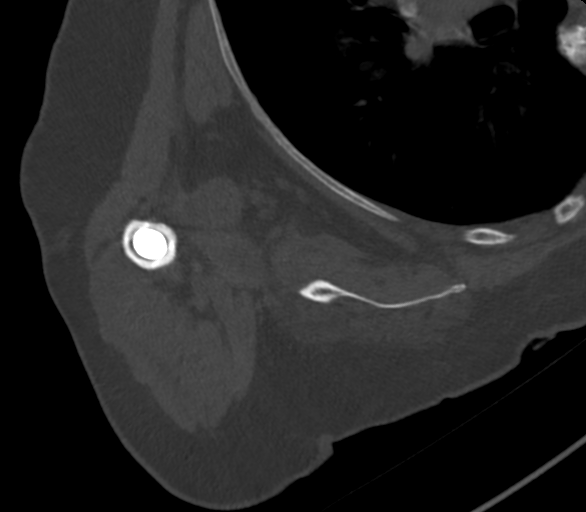
[im 43/92  bone]
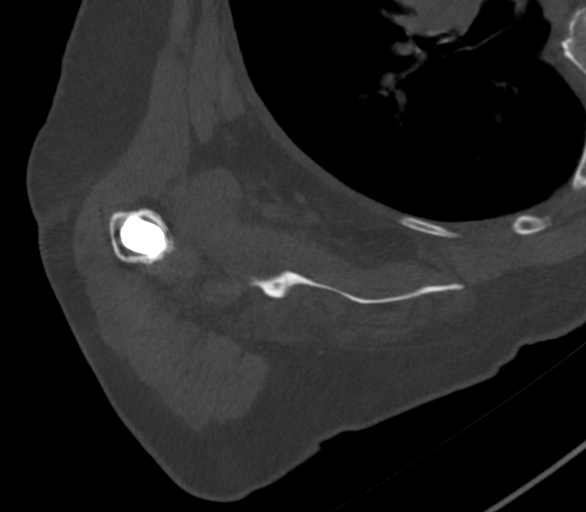
[im 50/92  bone]
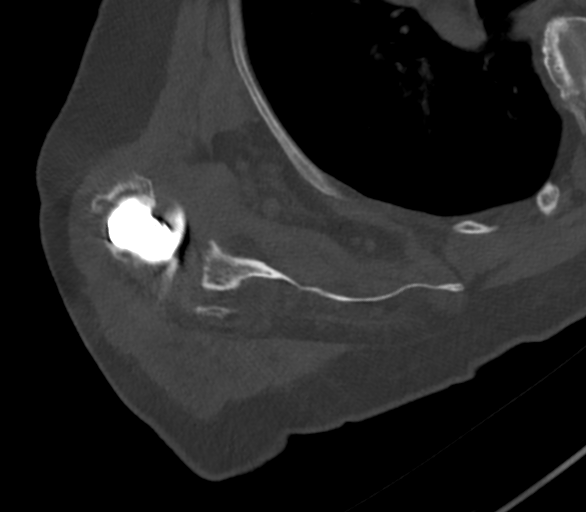
[im 57/92  bone]
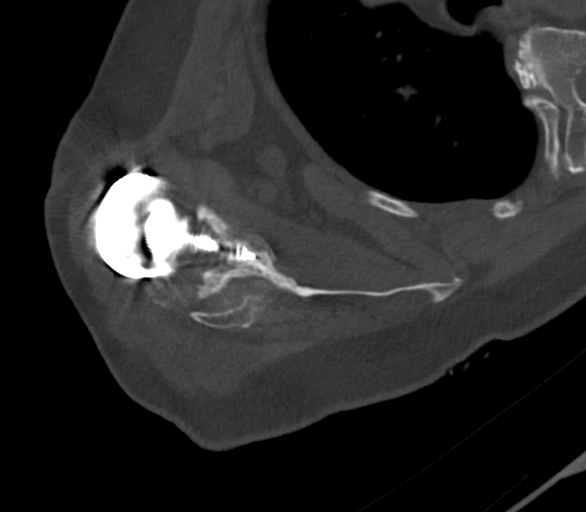
[im 64/92  soft-tissue]
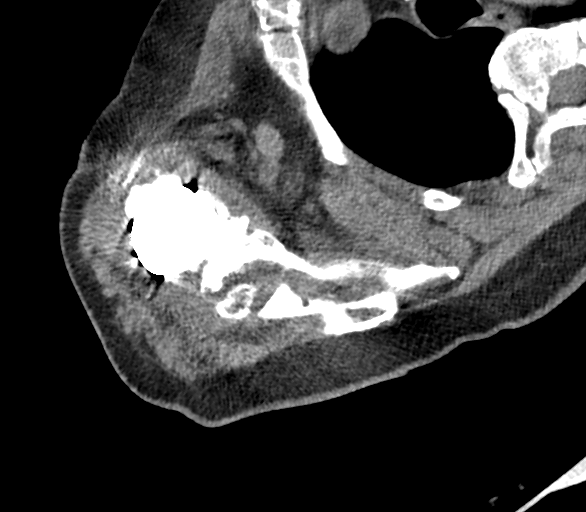
[im 64/92  bone]
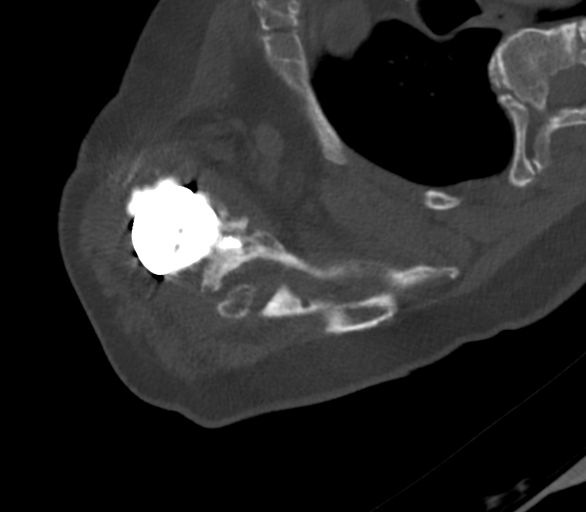
[im 71/92  bone]
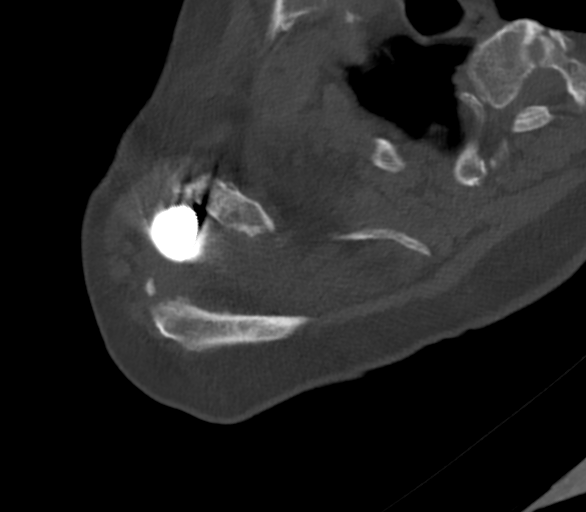
[im 78/92  bone]
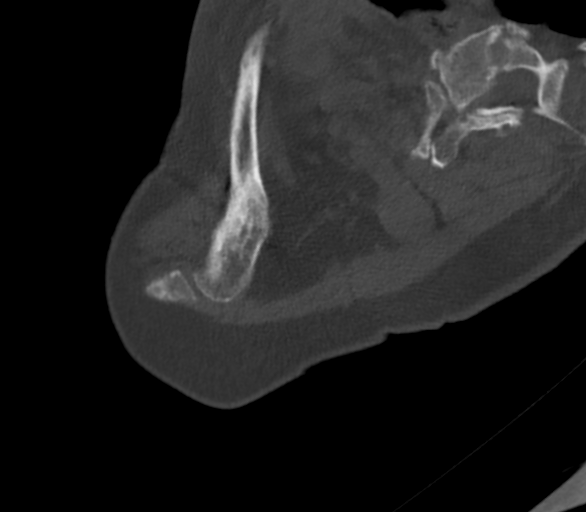
[im 85/92  bone]
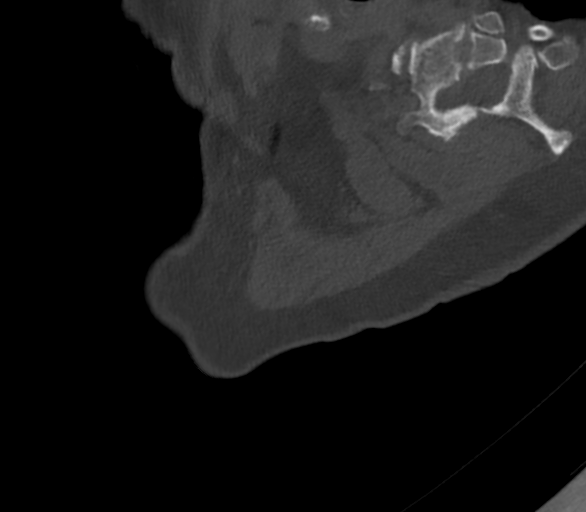

[12 of 14 positions shown; findings below may reference images not displayed]

FINDINGS: Since the prior CT scan the patient has had a reversed total
shoulder arthroplasty. The components appear well seated without
complicating features. The glenoid appears widened and somewhat
irregular. This could reflect a healed fracture but I do not see any
acute fracture. There is an old probable avulsion type fracture
fragment posterior to the glenoid which is smoothly marginated and
well corticated. The scapular body is intact. The acromion and
clavicle are intact.

The visualized ribs are intact and the visualized right lung is
grossly clear.
IMPRESSION: 1. Status post reversed total shoulder arthroplasty. The components
appear well seated without complicating features.
2. The glenoid appears widened and somewhat irregular. This could
reflect a healed fracture but I do not see any acute fracture.
3. Remote avulsion type fracture fragment posterior to the glenoid.

## 2020-12-13 ENCOUNTER — Encounter: Payer: Self-pay | Admitting: Emergency Medicine

## 2020-12-13 ENCOUNTER — Ambulatory Visit
Admission: EM | Admit: 2020-12-13 | Discharge: 2020-12-13 | Disposition: A | Discharge status: Home / Self Care | Payer: Medicare Other | Attending: Emergency Medicine | Admitting: Emergency Medicine

## 2020-12-13 ENCOUNTER — Ambulatory Visit: Payer: Self-pay

## 2020-12-13 ENCOUNTER — Other Ambulatory Visit: Payer: Self-pay

## 2020-12-13 DIAGNOSIS — R300 Dysuria: Secondary | ICD-10-CM

## 2020-12-13 LAB — POCT URINALYSIS DIP (MANUAL ENTRY)
Bilirubin, UA: NEGATIVE
Blood, UA: NEGATIVE
Glucose, UA: NEGATIVE mg/dL
Ketones, POC UA: NEGATIVE mg/dL
Nitrite, UA: NEGATIVE
Protein Ur, POC: NEGATIVE mg/dL
Spec Grav, UA: 1.02 (ref 1.010–1.025)
Urobilinogen, UA: 0.2 E.U./dL
pH, UA: 5 (ref 5.0–8.0)

## 2020-12-13 NOTE — ED Provider Notes (Signed)
Subjective:    Angela Fernandez is a very pleasant 75 y.o. female who presents with concerns for UTI due to dysuria for the last 11 days.  Patient reports burning with urination yesterday, mid lower abdominal pressure and bilateral low back pain at times.  Patient reports a "different smell to urine".  She also reports "never really getting over the symptoms "after recent antibiotic use.  Patient states that "I have been keeping my stomach warm to keep the pressure away".  Patient reports that she has noticed some cloudy urine for a while as well.  She reports that she was prescribed antibiotics for a UTI on 12/02/2020 and states that her symptoms have continued. No unilateral back pain, vomiting, fever, vaginal discharge.  Past medical history, past surgical history, current medications reviewed.  Allergies: has No Known Allergies.  Review of Systems See HPI   Objective:     Vitals:   12/13/20 1045  BP: 129/81  Pulse: 86  Resp: 17  Temp: 98.3 F (36.8 C)  SpO2: 93%     General: Appears well-developed and well-nourished. No acute distress.  Cardiovascular: Normal rate  Pulm/Chest: No respiratory distress.  Abdominal: No CVAT.  Neurological: Alert and oriented to person, place, and time.  Skin: Skin is warm and dry.  Psychiatric: Normal mood, affect, behavior, and thought content.  GU:  Deferred secondary to self collect specimen  Laboratory:  Orders Placed This Encounter  Procedures   Urine Culture   POCT urinalysis dipstick   Results for orders placed or performed during the hospital encounter of 12/13/20  POCT urinalysis dipstick  Result Value Ref Range   Color, UA yellow yellow   Clarity, UA clear clear   Glucose, UA negative negative mg/dL   Bilirubin, UA negative negative   Ketones, POC UA negative negative mg/dL   Spec Grav, UA 1.020 1.010 - 1.025   Blood, UA negative negative   pH, UA 5.0 5.0 - 8.0   Protein Ur, POC negative negative mg/dL   Urobilinogen, UA 0.2  0.2 or 1.0 E.U./dL   Nitrite, UA Negative Negative   Leukocytes, UA Trace (A) Negative    -Trace leukocytes -Urine culture pending Assessment:   1. Dysuria - Urine Culture; Standing - POCT urinalysis dipstick; Standing - Urine Culture - POCT urinalysis dipstick   Plan:   MDM: Patient presents with  concerns for UTI due to dysuria for the last 11 days.  Patient reports burning with urination yesterday, mid lower abdominal pressure and bilateral low back pain at times.  Patient reports a "different smell to urine".  She also reports "never really getting over the symptoms "after recent antibiotic use.  Patient states that "I have been keeping my stomach warm to keep the pressure away".  Patient reports that she has noticed some cloudy urine for a while as well.  She reports that she was prescribed antibiotics for a UTI on 12/02/2020 and states that her symptoms have continued. No unilateral back pain, vomiting, fever, vaginal discharge.  Chart review completed.  Urinalysis reveals trace leukocytes, otherwise negative.  Given the patient's recent UTI, I did send urine for culture.  Patient's last urine culture grew E. coli and she has been prescribed Keflex.  I do feel that the infection has cleared.  Given the patient's continued dysuria, explained to the patient that she could be experiencing vaginal atrophy, but explained that if there is bacteria in the urine to suggest a UTI that we would send antibiotics if warranted.  Did advise the patient to use a pea-sized amount of vaginal lubricant daily to help with her symptoms and advised to follow-up with establishing care with gynecology.  Patient states that she has not been able to get in touch with her PCP office and states that she is not established with a gynecology practice as she did recently moved from [place].  Patient verbalized understanding and agreed with plan.  Patient stable upon discharge.  Return as needed.    Discharge  Instructions      Your urine sample today was sent for a culture.  If your urine indicates that you have an infection in the urine, we will call you to initiate antibiotic therapy.  If you do not receive a call in the next 3-4 days, it means that you do not need antibiotics for a urinary tract infection.  Please go to the ER for continued nausea, vomiting, unilateral back pain, or worsening urinary symptoms.   Call to schedule an appointment to establish care with [facility] for continued symptoms of UTI  Use a pea sized amount of vaginal lubricant ([place] Jelly) daily as directed         Serafina Royals, [place] 12/13/20 1106

## 2020-12-13 NOTE — Discharge Instructions (Addendum)
Your urine sample today was sent for a culture.  If your urine indicates that you have an infection in the urine, we will call you to initiate antibiotic therapy.  If you do not receive a call in the next 3-4 days, it means that you do not need antibiotics for a urinary tract infection.  Please go to the ER for continued nausea, vomiting, unilateral back pain, or worsening urinary symptoms.   Call to schedule an appointment to establish care with [facility] for continued symptoms of UTI  Use a pea sized amount of vaginal lubricant ([place] Jelly) daily as directed

## 2020-12-13 NOTE — ED Triage Notes (Signed)
Patient c/o dysuria x 11 days.   Patient endorses "burning" with urination yesterday.   Patient endorses mid lower ABD pain.   Patient endorses back pain at times.  Patient endorses " a different smell to urine".   Patient endorses being seen at this clinic on 7/26 and receiving "antibiotics" for UTI, the symptoms resolved and then reoccurred.   Patient endorses a recent diagnosis of DM.

## 2020-12-15 DIAGNOSIS — M25511 Pain in right shoulder: Secondary | ICD-10-CM | POA: Diagnosis not present

## 2020-12-15 DIAGNOSIS — Z96611 Presence of right artificial shoulder joint: Secondary | ICD-10-CM | POA: Diagnosis not present

## 2020-12-16 LAB — URINE CULTURE: Culture: NO GROWTH

## 2020-12-19 DIAGNOSIS — M25511 Pain in right shoulder: Secondary | ICD-10-CM | POA: Diagnosis not present

## 2020-12-19 DIAGNOSIS — Z96611 Presence of right artificial shoulder joint: Secondary | ICD-10-CM | POA: Diagnosis not present

## 2020-12-23 ENCOUNTER — Other Ambulatory Visit (INDEPENDENT_AMBULATORY_CARE_PROVIDER_SITE_OTHER): Payer: Self-pay | Admitting: Vascular Surgery

## 2020-12-23 DIAGNOSIS — I739 Peripheral vascular disease, unspecified: Secondary | ICD-10-CM

## 2020-12-24 ENCOUNTER — Other Ambulatory Visit: Payer: Self-pay | Admitting: Family Medicine

## 2020-12-24 DIAGNOSIS — E119 Type 2 diabetes mellitus without complications: Secondary | ICD-10-CM | POA: Insufficient documentation

## 2020-12-24 DIAGNOSIS — I872 Venous insufficiency (chronic) (peripheral): Secondary | ICD-10-CM | POA: Insufficient documentation

## 2020-12-24 DIAGNOSIS — E785 Hyperlipidemia, unspecified: Secondary | ICD-10-CM | POA: Insufficient documentation

## 2020-12-24 DIAGNOSIS — E1159 Type 2 diabetes mellitus with other circulatory complications: Secondary | ICD-10-CM

## 2020-12-24 DIAGNOSIS — Z794 Long term (current) use of insulin: Secondary | ICD-10-CM

## 2020-12-24 DIAGNOSIS — I890 Lymphedema, not elsewhere classified: Secondary | ICD-10-CM | POA: Insufficient documentation

## 2020-12-24 DIAGNOSIS — I10 Essential (primary) hypertension: Secondary | ICD-10-CM | POA: Insufficient documentation

## 2020-12-24 NOTE — Progress Notes (Signed)
MRN : [id number]  Angela Fernandez is a 75 y.o. (06-16-1945) female who presents with chief complaint of leg pain and swelling.  History of Present Illness:   Patient is seen for evaluation of leg pain and leg swelling. The patient first noticed the swelling remotely. The swelling is associated with pain and discoloration. The pain and swelling worsens with prolonged dependency and improves with elevation. The pain is unrelated to activity.  The patient notes that in the morning the legs are significantly improved but they steadily worsened throughout the course of the day. The patient also notes a steady worsening of the discoloration in the ankle and shin area.   The patient denies claudication symptoms.  The patient denies symptoms consistent with rest pain.  The patient has an extensive history of DJD of the spine disease.  The patient has no had any past angiography, interventions or vascular surgery.  Elevation makes the leg symptoms better, dependency makes them much worse. There is no history of ulcerations. The patient denies any recent changes in medications.  The patient has not been wearing graduated compression.  The patient denies a history of DVT or PE. There is no prior history of phlebitis. There is no history of primary lymphedema.  No history of malignancies. No history of trauma or groin or pelvic surgery. There is no history of radiation treatment to the groin or pelvis  The patient denies amaurosis fugax or recent TIA symptoms. There are no recent neurological changes noted. The patient denies recent episodes of angina or shortness of breath   No outpatient medications have been marked as taking for the 12/25/20 encounter (Appointment) with Delana Meyer, Dolores Lory, MD.    Past Medical History:  Diagnosis Date   Anemia    Bipolar affect, depressed ([place])    COPD (chronic obstructive pulmonary disease) ([place])    Diabetes mellitus without complication ([place])    type II    GERD (gastroesophageal reflux disease)    Hyperlipidemia    Hypertension    Hypothyroidism    PTSD (post-traumatic stress disorder)    Schizoaffective disorder ([place])    TIA (transient ischemic attack) 2010    Past Surgical History:  Procedure Laterality Date   BILATERAL CARPAL TUNNEL RELEASE Bilateral    BREAST SURGERY Right    lumpectomy   CATARACT EXTRACTION W/ INTRAOCULAR LENS  IMPLANT, BILATERAL Bilateral    CHOLECYSTECTOMY     COLONOSCOPY     ESOPHAGOGASTRODUODENOSCOPY     EYE SURGERY     JOINT REPLACEMENT     NASAL SINUS SURGERY     REPLACEMENT TOTAL KNEE BILATERAL Bilateral    REVERSE SHOULDER ARTHROPLASTY Right 09/02/2020   Procedure: REVERSE SHOULDER ARTHROPLASTY;  Surgeon: Corky Mull, MD;  Location: [facility] ORS;  Service: Orthopedics;  Laterality: Right;   SHOULDER SURGERY Right 2020   rotator cuff repair    Social History Social History   Tobacco Use   Smoking status: Former    Years: 54.00    Types: Cigarettes    Quit date: 05/10/2016    Years since quitting: 4.6   Smokeless tobacco: Never  Vaping Use   Vaping Use: Never used  Substance Use Topics   Alcohol use: Not Currently    Comment: quit in age 75's or 48's   Drug use: Not Currently    Types: Cocaine, Heroin, Marijuana, LSD    Comment: last used in age 58's    Family History Family History  Problem Relation Age of Onset  Pancreatitis Father     No Known Allergies   REVIEW OF SYSTEMS (Negative unless checked)  Constitutional: '[]'$ Weight loss  '[]'$ Fever  '[]'$ Chills Cardiac: '[]'$ Chest pain   '[]'$ Chest pressure   '[]'$ Palpitations   '[]'$ Shortness of breath when laying flat   '[]'$ Shortness of breath with exertion. Vascular:  '[]'$ Pain in legs with walking   '[x]'$ Pain in legs at rest  '[]'$ History of DVT   '[]'$ Phlebitis   '[x]'$ Swelling in legs   '[]'$ Varicose veins   '[]'$ Non-healing ulcers Pulmonary:   '[]'$ Uses home oxygen   '[]'$ Productive cough   '[]'$ Hemoptysis   '[]'$ Wheeze  '[]'$ COPD   '[]'$ Asthma Neurologic:  '[]'$ Dizziness   '[]'$ Seizures    '[]'$ History of stroke   '[]'$ History of TIA  '[]'$ Aphasia   '[]'$ Vissual changes   '[]'$ Weakness or numbness in arm   '[]'$ Weakness or numbness in leg Musculoskeletal:   '[]'$ Joint swelling   '[]'$ Joint pain   '[x]'$ Low back pain Hematologic:  '[]'$ Easy bruising  '[]'$ Easy bleeding   '[]'$ Hypercoagulable state   '[]'$ Anemic Gastrointestinal:  '[]'$ Diarrhea   '[]'$ Vomiting  '[]'$ Gastroesophageal reflux/heartburn   '[]'$ Difficulty swallowing. Genitourinary:  '[]'$ Chronic kidney disease   '[]'$ Difficult urination  '[]'$ Frequent urination   '[]'$ Blood in urine Skin:  '[]'$ Rashes   '[]'$ Ulcers  Psychological:  '[]'$ History of anxiety   '[]'$  History of major depression.  Physical Examination  There were no vitals filed for this visit. There is no height or weight on file to calculate BMI. Gen: WD/WN, NAD Head: [place]/AT, No temporalis wasting.  Ear/Nose/Throat: Hearing grossly intact, nares w/o erythema or drainage, pinna without lesions Eyes: PER, EOMI, sclera nonicteric.  Neck: Supple, no gross masses.  No JVD.  Pulmonary:  Good air movement, no audible wheezing, no use of accessory muscles.  Cardiac: RRR, precordium not hyperdynamic. Vascular:  scattered varicosities present bilaterally.  Mild venous stasis changes to the legs bilaterally.  2+ soft pitting edema  Vessel Right Left  Radial Palpable Palpable  Gastrointestinal: soft, non-distended. No guarding/no peritoneal signs.  Musculoskeletal: M/S 5/5 throughout.  No deformity.  Neurologic: CN 2-12 intact. Pain and light touch intact in extremities.  Symmetrical.  Speech is fluent. Motor exam as listed above. Psychiatric: Judgment intact, Mood & affect appropriate for pt's clinical situation. Dermatologic: Venous rashes no ulcers noted.  No changes consistent with cellulitis. Lymph : No lichenification or skin changes of chronic lymphedema.  CBC Lab Results  Component Value Date   WBC 13.7 (H) 09/04/2020   HGB 10.7 (L) 09/04/2020   HCT 33.0 (L) 09/04/2020   MCV 97.1 09/04/2020   PLT 269 09/04/2020     BMET    Component Value Date/Time   NA 134 (L) 09/05/2020 0434   NA 139 04/28/2020 1100   K 4.1 09/05/2020 0434   CL 98 09/05/2020 0434   CO2 26 09/05/2020 0434   GLUCOSE 124 (H) 09/05/2020 0434   BUN 28 (A) 09/25/2020 0000   CREATININE 1.0 09/25/2020 0000   CREATININE 0.71 09/05/2020 0434   CALCIUM 9.7 09/05/2020 0434   GFRNONAA >60 09/05/2020 0434   GFRAA 64 04/28/2020 1100   CrCl cannot be calculated (Patient's most recent lab result is older than the maximum 21 days allowed.).  COAG No results found for: INR, PROTIME  Radiology CT SHOULDER RIGHT WO CONTRAST  Result Date: 12/10/2020 CLINICAL DATA:  Status post reversed total shoulder arthroplasty. Closed displaced fracture of the scapular neck. EXAM: CT OF THE UPPER RIGHT EXTREMITY WITHOUT CONTRAST TECHNIQUE: Multidetector CT imaging of the upper right extremity was performed according to the standard protocol. COMPARISON:  CT scan 08/01/2020 FINDINGS: Since the prior CT scan the patient has had a reversed total shoulder arthroplasty. The components appear well seated without complicating features. The glenoid appears widened and somewhat irregular. This could reflect a healed fracture but I do not see any acute fracture. There is an old probable avulsion type fracture fragment posterior to the glenoid which is smoothly marginated and well corticated. The scapular body is intact. The acromion and clavicle are intact. The visualized ribs are intact and the visualized right lung is grossly clear. IMPRESSION: 1. Status post reversed total shoulder arthroplasty. The components appear well seated without complicating features. 2. The glenoid appears widened and somewhat irregular. This could reflect a healed fracture but I do not see any acute fracture. 3. Remote avulsion type fracture fragment posterior to the glenoid. Electronically Signed   By: Marijo Sanes M.D.   On: 12/10/2020 22:01     Assessment/Plan 1. Chronic venous  insufficiency No surgery or intervention at this point in time.  I have reviewed my discussion with the patient regarding venous insufficiency and why it causes symptoms. I have discussed with the patient the chronic skin changes that accompany venous insufficiency and the long term sequela such as ulceration. Patient will contnue wearing graduated compression stockings on a daily basis, as this has provided excellent control of his edema. The patient will put the stockings on first thing in the morning and removing them in the evening. The patient is reminded not to sleep in the stockings.  In addition, behavioral modification including elevation during the day will be initiated. Exercise is strongly encouraged.  Given the patient's good control and lack of any problems regarding the venous insufficiency and lymphedema a lymph pump in not need at this time.  The patient will follow up with me PRN should anything change.  The patient voices agreement with this plan.   2. Lymphedema No surgery or intervention at this point in time.  I have reviewed my discussion with the patient regarding venous insufficiency and why it causes symptoms. I have discussed with the patient the chronic skin changes that accompany venous insufficiency and the long term sequela such as ulceration. Patient will contnue wearing graduated compression stockings on a daily basis, as this has provided excellent control of his edema. The patient will put the stockings on first thing in the morning and removing them in the evening. The patient is reminded not to sleep in the stockings.  In addition, behavioral modification including elevation during the day will be initiated. Exercise is strongly encouraged.  Given the patient's good control and lack of any problems regarding the venous insufficiency and lymphedema a lymph pump in not need at this time.  The patient will follow up with me PRN should anything change.  The patient  voices agreement with this plan.   3. Osteoarthritis of cervical spine, unspecified spinal osteoarthritis complication status Continue NSAID medications as already ordered, these medications have been reviewed and there are no changes at this time.  Continued activity and therapy was stressed.  - Ambulatory referral to Neurosurgery  4. Type 2 diabetes mellitus without complication, unspecified whether long term insulin use (HCC) Continue hypoglycemic medications as already ordered, these medications have been reviewed and there are no changes at this time.  Hgb A1C to be monitored as already arranged by primary service   5. Mixed hyperlipidemia Continue statin as ordered and reviewed, no changes at this time   6. Essential hypertension Continue antihypertensive medications  as already ordered, these medications have been reviewed and there are no changes at this time.    Hortencia Pilar, MD  12/24/2020 9:13 PM

## 2020-12-25 ENCOUNTER — Other Ambulatory Visit: Payer: Self-pay

## 2020-12-25 ENCOUNTER — Ambulatory Visit (INDEPENDENT_AMBULATORY_CARE_PROVIDER_SITE_OTHER): Payer: Medicare Other | Admitting: Vascular Surgery

## 2020-12-25 ENCOUNTER — Encounter (INDEPENDENT_AMBULATORY_CARE_PROVIDER_SITE_OTHER): Payer: Self-pay | Admitting: Vascular Surgery

## 2020-12-25 ENCOUNTER — Ambulatory Visit (INDEPENDENT_AMBULATORY_CARE_PROVIDER_SITE_OTHER): Payer: Medicare Other

## 2020-12-25 DIAGNOSIS — I872 Venous insufficiency (chronic) (peripheral): Secondary | ICD-10-CM | POA: Diagnosis not present

## 2020-12-25 DIAGNOSIS — I890 Lymphedema, not elsewhere classified: Secondary | ICD-10-CM | POA: Diagnosis not present

## 2020-12-25 DIAGNOSIS — I10 Essential (primary) hypertension: Secondary | ICD-10-CM

## 2020-12-25 DIAGNOSIS — M47812 Spondylosis without myelopathy or radiculopathy, cervical region: Secondary | ICD-10-CM | POA: Diagnosis not present

## 2020-12-25 DIAGNOSIS — I739 Peripheral vascular disease, unspecified: Secondary | ICD-10-CM

## 2020-12-25 DIAGNOSIS — E782 Mixed hyperlipidemia: Secondary | ICD-10-CM | POA: Diagnosis not present

## 2020-12-25 DIAGNOSIS — E119 Type 2 diabetes mellitus without complications: Secondary | ICD-10-CM | POA: Diagnosis not present

## 2020-12-26 DIAGNOSIS — R413 Other amnesia: Secondary | ICD-10-CM | POA: Diagnosis not present

## 2020-12-28 ENCOUNTER — Encounter (INDEPENDENT_AMBULATORY_CARE_PROVIDER_SITE_OTHER): Payer: Self-pay | Admitting: Vascular Surgery

## 2020-12-28 DIAGNOSIS — Z8659 Personal history of other mental and behavioral disorders: Secondary | ICD-10-CM | POA: Insufficient documentation

## 2020-12-28 DIAGNOSIS — M47812 Spondylosis without myelopathy or radiculopathy, cervical region: Secondary | ICD-10-CM | POA: Insufficient documentation

## 2020-12-28 DIAGNOSIS — R413 Other amnesia: Secondary | ICD-10-CM | POA: Insufficient documentation

## 2020-12-28 DIAGNOSIS — F411 Generalized anxiety disorder: Secondary | ICD-10-CM | POA: Insufficient documentation

## 2021-01-08 DIAGNOSIS — Z96611 Presence of right artificial shoulder joint: Secondary | ICD-10-CM | POA: Diagnosis not present

## 2021-01-15 ENCOUNTER — Encounter: Payer: Medicare Other | Admitting: Obstetrics and Gynecology

## 2021-01-16 DIAGNOSIS — M25511 Pain in right shoulder: Secondary | ICD-10-CM | POA: Diagnosis not present

## 2021-01-16 DIAGNOSIS — Z96611 Presence of right artificial shoulder joint: Secondary | ICD-10-CM | POA: Diagnosis not present

## 2021-01-19 ENCOUNTER — Encounter: Payer: Self-pay | Admitting: Obstetrics and Gynecology

## 2021-01-19 ENCOUNTER — Other Ambulatory Visit: Payer: Self-pay | Admitting: Family Medicine

## 2021-01-19 ENCOUNTER — Other Ambulatory Visit: Payer: Self-pay | Admitting: Internal Medicine

## 2021-01-19 DIAGNOSIS — Z794 Long term (current) use of insulin: Secondary | ICD-10-CM

## 2021-01-19 DIAGNOSIS — E1159 Type 2 diabetes mellitus with other circulatory complications: Secondary | ICD-10-CM

## 2021-01-19 DIAGNOSIS — D509 Iron deficiency anemia, unspecified: Secondary | ICD-10-CM

## 2021-01-19 MED ORDER — FERROUS SULFATE 325 (65 FE) MG PO TABS: 325.0000 mg | ORAL_TABLET | Freq: Every day | ORAL | 0 refills | Status: DC

## 2021-01-20 DIAGNOSIS — M25511 Pain in right shoulder: Secondary | ICD-10-CM | POA: Diagnosis not present

## 2021-01-20 DIAGNOSIS — Z96611 Presence of right artificial shoulder joint: Secondary | ICD-10-CM | POA: Diagnosis not present

## 2021-01-22 DIAGNOSIS — M5136 Other intervertebral disc degeneration, lumbar region: Secondary | ICD-10-CM | POA: Diagnosis not present

## 2021-01-22 DIAGNOSIS — M25511 Pain in right shoulder: Secondary | ICD-10-CM | POA: Diagnosis not present

## 2021-01-22 DIAGNOSIS — Z96611 Presence of right artificial shoulder joint: Secondary | ICD-10-CM | POA: Diagnosis not present

## 2021-01-22 DIAGNOSIS — M542 Cervicalgia: Secondary | ICD-10-CM | POA: Diagnosis not present

## 2021-01-22 DIAGNOSIS — M5416 Radiculopathy, lumbar region: Secondary | ICD-10-CM | POA: Diagnosis not present

## 2021-01-22 DIAGNOSIS — G959 Disease of spinal cord, unspecified: Secondary | ICD-10-CM | POA: Diagnosis not present

## 2021-01-24 ENCOUNTER — Ambulatory Visit (INDEPENDENT_AMBULATORY_CARE_PROVIDER_SITE_OTHER): Payer: Medicare Other | Admitting: *Deleted

## 2021-01-24 ENCOUNTER — Other Ambulatory Visit: Payer: Self-pay

## 2021-01-24 DIAGNOSIS — Z0000 Encounter for general adult medical examination without abnormal findings: Secondary | ICD-10-CM | POA: Diagnosis not present

## 2021-01-24 NOTE — Progress Notes (Signed)
Subjective:   Angela Fernandez is a 75 y.o. female who presents for Medicare Annual (Subsequent) preventive examination.  I connected with  Angela Fernandez on 01/24/21 by a Audio enabled telemedicine application and verified that I am speaking with the correct person using two identifiers.   I discussed the limitations of evaluation and management by telemedicine. The patient expressed understanding and agreed to proceed.   Location of Patient: Home Location of Staff: Office  Person attending visit: [name] and daughter [name]; [name], RMA    Objective:    Today's Vitals   01/24/21 1538  PainSc: 0-No pain   There is no height or weight on file to calculate BMI.  Advanced Directives 01/24/2021 09/02/2020 08/22/2020  Does Patient Have a Medical Advance Directive? Yes Yes Yes  Type of Advance Directive Living will;Healthcare Power of Attorney Living will [place];Living will  Does patient want to make changes to medical advance directive? No - Patient declined No - Patient declined No - Patient declined  Copy of [place] in Chart? No - copy requested No - copy requested No - copy requested  Would patient like information on creating a medical advance directive? No - Patient declined - -    Current Medications (verified) Outpatient Encounter Medications as of 01/24/2021  Medication Sig   acetaminophen (TYLENOL) 325 MG tablet Take 650 mg by mouth every 4 (four) hours as needed.   albuterol (VENTOLIN HFA) 108 (90 Base) MCG/ACT inhaler Inhale 2 puffs into the lungs every 6 (six) hours as needed for wheezing or shortness of breath.   buPROPion (WELLBUTRIN SR) 200 MG 12 hr tablet Take 1 tablet (200 mg total) by mouth 2 (two) times daily. Take 1 bid   clonazePAM (KLONOPIN) 1 MG tablet Take 1.5 mg by mouth daily. Take 1.5 tabs in the am, 1 tabs at noon, and 1 tab at bedtime/ psych   cyclobenzaprine (FLEXERIL) 10 MG tablet Take 1 tablet (10 mg  total) by mouth at bedtime.   desvenlafaxine (PRISTIQ) 50 MG 24 hr tablet Take 50 mg by mouth at bedtime.   docusate sodium (COLACE) 100 MG capsule Take 200 mg by mouth 2 (two) times daily.   fenofibrate (TRICOR) 145 MG tablet Take 1 tablet (145 mg total) by mouth daily. 4pm   ferrous sulfate 325 (65 FE) MG tablet Take 1 tablet (325 mg total) by mouth daily with breakfast.   Glucagon, rDNA, (GLUCAGON EMERGENCY) 1 MG KIT Inject 1 Device as directed as needed (hypoglycemia).   insulin glargine (LANTUS SOLOSTAR) 100 UNIT/ML Solostar Pen Inject 30 Units into the skin daily. 30 units daily into skin (Patient taking differently: Inject 24 Units into the skin daily. 30 units daily into skin)   lactulose (CHRONULAC) 10 GM/15ML solution Take 10 g by mouth daily as needed.   levothyroxine (SYNTHROID) 175 MCG tablet Take 1 tablet (175 mcg total) by mouth daily before breakfast.   lisinopril (ZESTRIL) 5 MG tablet Take 1 tablet (5 mg total) by mouth daily.   Lurasidone HCl (LATUDA) 120 MG TABS Take 1 tablet by mouth daily. Psych Takes at 4 pm   meloxicam (MOBIC) 7.5 MG tablet Take 1 tablet (7.5 mg total) by mouth daily. (Patient taking differently: Take 7.5 mg by mouth daily. May take another dose in the afternoon)   metoprolol succinate (TOPROL-XL) 100 MG 24 hr tablet Take 1 tablet (100 mg total) by mouth daily. Take with or immediately following a meal.  Multiple Vitamins-Minerals (HAIR SKIN AND NAILS FORMULA PO) Take 1 tablet by mouth at bedtime.   Multiple Vitamins-Minerals (PRESERVISION AREDS PO) Take 1 capsule by mouth in the morning and at bedtime.   omeprazole (PRILOSEC) 40 MG capsule Take 1 capsule by mouth once daily   OVER THE COUNTER MEDICATION Take 1 Scoop by mouth daily in the afternoon. Collagen protein powder   Polyethyl Glycol-Propyl Glycol (SYSTANE OP) Apply 2 drops to eye 3 (three) times daily as needed.   simvastatin (ZOCOR) 20 MG tablet Take 1 tablet (20 mg total) by mouth daily.    traMADol (ULTRAM) 50 MG tablet Take 1 tablet (50 mg total) by mouth every 6 (six) hours as needed for moderate pain.   traZODone (DESYREL) 50 MG tablet Take 50 mg by mouth at bedtime. Take one tablet at bedtime/ psych   TRULICITY 3.26 ZT/2.4PY SOPN INJECT 1 SYRINGEFUL SUBCUTANEOUSLY ONCE A WEEK   umeclidinium-vilanterol (ANORO ELLIPTA) 62.5-25 MCG/INH AEPB Inhale 1 puff into the lungs daily.   zolpidem (AMBIEN) 5 MG tablet Take 5 mg by mouth at bedtime.   No facility-administered encounter medications on file as of 01/24/2021.    Allergies (verified) Patient has no known allergies.   History: Past Medical History:  Diagnosis Date   Anemia    Bipolar affect, depressed ([place])    COPD (chronic obstructive pulmonary disease) (HCC)    Diabetes mellitus without complication (HCC)    type II   GERD (gastroesophageal reflux disease)    Hyperlipidemia    Hypertension    Hypothyroidism    PTSD (post-traumatic stress disorder)    Schizoaffective disorder (HCC)    TIA (transient ischemic attack) 2010   Past Surgical History:  Procedure Laterality Date   BILATERAL CARPAL TUNNEL RELEASE Bilateral    BREAST SURGERY Right    lumpectomy   CATARACT EXTRACTION W/ INTRAOCULAR LENS  IMPLANT, BILATERAL Bilateral    CHOLECYSTECTOMY     COLONOSCOPY     ESOPHAGOGASTRODUODENOSCOPY     EYE SURGERY     JOINT REPLACEMENT     NASAL SINUS SURGERY     REPLACEMENT TOTAL KNEE BILATERAL Bilateral    REVERSE SHOULDER ARTHROPLASTY Right 09/02/2020   Procedure: REVERSE SHOULDER ARTHROPLASTY;  Surgeon: Corky Mull, MD;  Location: [facility] ORS;  Service: Orthopedics;  Laterality: Right;   SHOULDER SURGERY Right 2020   rotator cuff repair   Family History  Problem Relation Age of Onset   Pancreatitis Father    Social History   Socioeconomic History   Marital status: Single    Spouse name: Not on file   Number of children: 1   Years of education: Not on file   Highest education level: Not on file   Occupational History   Not on file  Tobacco Use   Smoking status: Former    Years: 54.00    Types: Cigarettes    Quit date: 05/10/2016    Years since quitting: 4.7   Smokeless tobacco: Never  Vaping Use   Vaping Use: Never used  Substance and Sexual Activity   Alcohol use: Not Currently    Comment: quit in age 68's or 50's   Drug use: Not Currently    Types: Cocaine, Heroin, Marijuana, LSD    Comment: last used in age 30's   Sexual activity: Not Currently  Other Topics Concern   Not on file  Social History Narrative   Moved from [place]; lives with daughter   Social Determinants of Radio broadcast assistant  Strain: Not on file  Food Insecurity: Not on file  Transportation Needs: Not on file  Physical Activity: Not on file  Stress: Not on file  Social Connections: Not on file    Tobacco Counseling Counseling given: Not Answered   Clinical Intake:    Pain : 0-10 Pain Score: 0-No pain  Diabetic?Yes   Activities of Daily Living In your present state of health, do you have any difficulty performing the following activities: 09/02/2020 09/02/2020  Hearing? N -  Vision? N -  Difficulty concentrating or making decisions? N -  Walking or climbing stairs? N -  Dressing or bathing? N -  Doing errands, shopping? - N  Some recent data might be hidden    Patient Care Team: Juline Patch, MD as PCP - General (Family Medicine)  Indicate any recent Medical Services you may have received from other than [facility] providers in the past year (date may be approximate).     Assessment:   This is a routine wellness examination for Bellina.  Hearing/Vision screen No results found.  Dietary issues and exercise activities discussed: Current Exercise Habits: Home exercise routine, Type of exercise: walking, Time (Minutes): 25, Frequency (Times/Week): 7, Weekly Exercise (Minutes/Week): 175, Intensity: Mild   Goals Addressed   None   Depression Screen PHQ 2/9 Scores 01/24/2021  10/24/2020 02/19/2020  PHQ - 2 Score 4 1 3   PHQ- 9 Score 14 6 8     Fall Risk Fall Risk  01/24/2021 02/19/2020  Falls in the past year? 0 0  Number falls in past yr: 0 -  Injury with Fall? 0 -  Risk for fall due to : No Fall Risks -  Risk for fall due to: Comment per pt 2 years ago she missed the last stairs and hurt herself but other then that no history of falls -  Follow up Falls evaluation completed Falls evaluation completed    [place]:  Any stairs in or around the home? Yes  If so, are there any without handrails? No  Home free of loose throw rugs in walkways, pet beds, electrical cords, etc? No  Adequate lighting in your home to reduce risk of falls? Yes   ASSISTIVE DEVICES UTILIZED TO PREVENT FALLS:  Life alert? No  Use of a cane, walker or w/c? No  Grab bars in the bathroom? No  Shower chair or bench in shower? No  Elevated toilet seat or a handicapped toilet? No   Cognitive Function:     6CIT Screen 01/24/2021  What month? 0 points  What time? 0 points  Count back from 20 2 points  Months in reverse 2 points  Repeat phrase 4 points    Immunizations Immunization History  Administered Date(s) Administered   Influenza-Unspecified 03/10/2020   PFIZER(Purple Top)SARS-COV-2 Vaccination 07/13/2019, 08/03/2019, 02/16/2020, 08/08/2020   Zoster Recombinat (Shingrix) 03/10/2020    TDAP status: Due, Education has been provided regarding the importance of this vaccine. Advised may receive this vaccine at local pharmacy or Health Dept. Aware to provide a copy of the vaccination record if obtained from local pharmacy or Health Dept. Verbalized acceptance and understanding.  Flu Vaccine status: Due, Education has been provided regarding the importance of this vaccine. Advised may receive this vaccine at local pharmacy or Health Dept. Aware to provide a copy of the vaccination record if obtained from local pharmacy or Health Dept. Verbalized  acceptance and understanding.  Pneumococcal vaccine status: Due, Education has been provided regarding  the importance of this vaccine. Advised may receive this vaccine at local pharmacy or Health Dept. Aware to provide a copy of the vaccination record if obtained from local pharmacy or Health Dept. Verbalized acceptance and understanding.  Covid-19 vaccine status: Information provided on how to obtain vaccines.   Qualifies for Shingles Vaccine? Yes   Zostavax completed No   Shingrix Completed?: No.    Education has been provided regarding the importance of this vaccine. Patient has been advised to call insurance company to determine out of pocket expense if they have not yet received this vaccine. Advised may also receive vaccine at local pharmacy or Health Dept. Verbalized acceptance and understanding.  Screening Tests Health Maintenance  Topic Date Due   FOOT EXAM  Never done   OPHTHALMOLOGY EXAM  Never done   INFLUENZA VACCINE  12/08/2020   COVID-19 Vaccine (5 - Booster for Pfizer series) 12/08/2020   Zoster Vaccines- Shingrix (2 of 2) 01/24/2021 (Originally 05/05/2020)   MAMMOGRAM  09/10/2021 (Originally 05/01/1997)   DEXA SCAN  09/10/2021 (Originally 05/01/2012)   TETANUS/TDAP  09/10/2021 (Originally 05/01/1966)   Hepatitis C Screening  09/10/2021 (Originally [birth year]01/1965)   HEMOGLOBIN A1C  03/28/2021   COLONOSCOPY (Pts 45-26yr Insurance coverage will need to be confirmed)  01/30/2030   HPV VACCINES  Aged Out    Health Maintenance  Health Maintenance Due  Topic Date Due   FOOT EXAM  Never done   OPHTHALMOLOGY EXAM  Never done   INFLUENZA VACCINE  12/08/2020   COVID-19 Vaccine (5 - Booster for [place]) 12/08/2020    Colorectal cancer screening: Type of screening: Colonoscopy. Completed 01/31/2020. Repeat every 10 years  Mammogram status: Completed 01/31/2020. Repeat every year  Bone Density Status: Not Completed. Patient will contact PCP  Lung Cancer Screening:  (Low Dose CT Chest recommended if Age 74-80years, 30 pack-year currently smoking OR have quit w/in 15years.) does qualify.    Additional Screening:  Hepatitis C Screening: does qualify; Completed 09/10/2020  Vision Screening: Recommended annual ophthalmology exams for early detection of glaucoma and other disorders of the eye. Is the patient up to date with their annual eye exam?  No  Who is the provider or what is the name of the office in which the patient attends annual eye exams? N/A If pt is not established with a provider, would they like to be referred to a provider to establish care? No .   Dental Screening: Recommended annual dental exams for proper oral hygiene  Community Resource Referral / Chronic Care Management: CRR required this visit?  No   CCM required this visit?  No      Plan:     I have personally reviewed and noted the following in the patient's chart:   Medical and social history Use of alcohol, tobacco or illicit drugs  Current medications and supplements including opioid prescriptions.  Functional ability and status Nutritional status Physical activity Advanced directives List of other physicians Hospitalizations, surgeries, and ER visits in previous 12 months Vitals Screenings to include cognitive, depression, and falls Referrals and appointments  In addition, I have reviewed and discussed with patient certain preventive protocols, quality metrics, and best practice recommendations. A written personalized care plan for preventive services as well as general preventive health recommendations were provided to patient.     REmilio AspenALoop RUtah  [id number]  Nurse Notes: None face to face, 45 min  Patient was the only one during telephone visit. Patient verbalized understanding during entire  visit. Per pt she have bipolar and depression. Per pt she do not like her current condition right now. Per pt she is use to living on her own and her  daughter have her living with her and her husband.  Per pt she is in a situation that she is not happy Per pt she have had thoughts of feeling dead in the last 2 weeks but she do not have a plan and she dont want to hurt herself. Per pt she do not have any weapons in the house. Per pt she have a therapist and she talks to her about the way she's feeling. Staff asked patient if her daughter knows and she stated she do not want to tell her daugther she will just tell there therapist. Per pt she feels like her depression meds are working. Pt do not want staff to tell her daughter because her daugth gets mad at her for feeling the way she feels and she feels like she have to help her. Per pt she's not happy in the situation she's in right now. Per pt she wants to get her own place and not being told what to do all the time and have a husband that dont like her or dont talk to her. Per pt she wants to take care of herself. Per pt shes living with her daughter because of her depression and not controlling her depression and diabetes.  Staff felt concerned about patient feeling exteremly depressed due to her PHQ9 answers.  Crisis line was called and had patient on the phone in a conference. Per pt, she wants them to come but she's don't want to make her daughter upset and mad at her. Staff informed patient that this could help her so she don't have to suffer on her by herself. Staff asked patient if she can talk with daughter and if staff can vouch for her to get the [place] with [facility] to come to the house. Patient agreed. Staff informed daughter that a she poke with patient and we wanted to get her permission to have a mobile crisis Lucianne Lei to come on her property. Pt daughter asked why and staff informed her that patient voiced some concerns due to patient questions that were asked to her during visit and staff just wanted to make sure patient is okay. Staff also informed patient daughter that pt therapist  is not working today and staff just wanted someone to go there and talk with her and to just make sure patient is okay. Pt daughter [name], stated that patient was fine this morning but if she needs it you all can send them. Pt daughter then asked staff if she knows patient have active major Depression so she's going to act like that. Staff informed daughter that she is aware due to patient informing staff.  Mobile Crisis and patient got on the phone and was talking about coordinating a time for them to come to her place. Phone was disconnected during call. Staff called patient number back about 4-5 times and was not able to get her. Staff then called the location [facility] department to do a safety check and they agreed. Staff finally got a hold of patient and informed her that mobile crisis will he headed her way and that police was sent out due to her not picking up for those 4-5 times when phone was disconnected. Patient stated that she don't want to make her daughter mad and she's going to  be even mad at her now that both are coming because today is suppose to be her cleaning day. Patient wanted staff to call the mobile crisis and cancel it and at that time the police was at the door trying to do a safety check and call was disconnect. Staff called the Mobile Crisis line to see how long it'll take them to get to patient home and they said due to where patient lives, about 1-2 hours.

## 2021-01-25 ENCOUNTER — Other Ambulatory Visit: Payer: Self-pay | Admitting: Family Medicine

## 2021-01-25 ENCOUNTER — Encounter: Payer: Self-pay | Admitting: Family Medicine

## 2021-01-25 DIAGNOSIS — D509 Iron deficiency anemia, unspecified: Secondary | ICD-10-CM

## 2021-01-25 DIAGNOSIS — K219 Gastro-esophageal reflux disease without esophagitis: Secondary | ICD-10-CM

## 2021-01-26 ENCOUNTER — Other Ambulatory Visit: Payer: Self-pay

## 2021-01-26 ENCOUNTER — Other Ambulatory Visit: Payer: Self-pay | Admitting: Neurosurgery

## 2021-01-26 DIAGNOSIS — D509 Iron deficiency anemia, unspecified: Secondary | ICD-10-CM

## 2021-01-26 DIAGNOSIS — M542 Cervicalgia: Secondary | ICD-10-CM

## 2021-01-26 DIAGNOSIS — K219 Gastro-esophageal reflux disease without esophagitis: Secondary | ICD-10-CM

## 2021-01-26 DIAGNOSIS — M5136 Other intervertebral disc degeneration, lumbar region: Secondary | ICD-10-CM

## 2021-01-26 DIAGNOSIS — G959 Disease of spinal cord, unspecified: Secondary | ICD-10-CM

## 2021-01-26 DIAGNOSIS — M5416 Radiculopathy, lumbar region: Secondary | ICD-10-CM

## 2021-01-26 MED ORDER — OMEPRAZOLE 40 MG PO CPDR
40.0000 mg | DELAYED_RELEASE_CAPSULE | Freq: Every day | ORAL | 0 refills | Status: DC
Start: 2021-01-26 — End: 2021-04-19

## 2021-01-26 MED ORDER — FERROUS SULFATE 325 (65 FE) MG PO TABS: 325.0000 mg | ORAL_TABLET | Freq: Every day | ORAL | 0 refills | Status: DC

## 2021-01-26 NOTE — Telephone Encounter (Signed)
Approved by other today. Pharmacy confirmed.This is a duplicate-refusing.

## 2021-01-27 DIAGNOSIS — Z96611 Presence of right artificial shoulder joint: Secondary | ICD-10-CM | POA: Diagnosis not present

## 2021-01-30 DIAGNOSIS — Z96611 Presence of right artificial shoulder joint: Secondary | ICD-10-CM | POA: Diagnosis not present

## 2021-01-30 DIAGNOSIS — M25511 Pain in right shoulder: Secondary | ICD-10-CM | POA: Diagnosis not present

## 2021-02-03 DIAGNOSIS — E119 Type 2 diabetes mellitus without complications: Secondary | ICD-10-CM | POA: Diagnosis not present

## 2021-02-03 DIAGNOSIS — Z96611 Presence of right artificial shoulder joint: Secondary | ICD-10-CM | POA: Diagnosis not present

## 2021-02-03 DIAGNOSIS — M25511 Pain in right shoulder: Secondary | ICD-10-CM | POA: Diagnosis not present

## 2021-02-03 LAB — BASIC METABOLIC PANEL
BUN: 33 — AB (ref 4–21)
Chloride: 103 (ref 99–108)
Creatinine: 1 (ref 0.5–1.1)
Glucose: 84
Potassium: 4.7 (ref 3.4–5.3)
Sodium: 140 (ref 137–147)

## 2021-02-03 LAB — HM DIABETES FOOT EXAM: HM Diabetic Foot Exam: NORMAL

## 2021-02-03 LAB — HEMOGLOBIN A1C: Hemoglobin A1C: 5.7

## 2021-02-06 ENCOUNTER — Emergency Department
Admission: EM | Admit: 2021-02-06 | Discharge: 2021-02-06 | Disposition: A | Discharge status: Home / Self Care | Payer: Medicare Other | Attending: Emergency Medicine | Admitting: Emergency Medicine

## 2021-02-06 ENCOUNTER — Other Ambulatory Visit: Payer: Self-pay

## 2021-02-06 ENCOUNTER — Ambulatory Visit: Payer: Medicare Other

## 2021-02-06 ENCOUNTER — Ambulatory Visit: Payer: Medicare Other | Admitting: Family Medicine

## 2021-02-06 ENCOUNTER — Emergency Department: Payer: Medicare Other

## 2021-02-06 ENCOUNTER — Ambulatory Visit: Payer: Self-pay

## 2021-02-06 DIAGNOSIS — R42 Dizziness and giddiness: Secondary | ICD-10-CM | POA: Diagnosis not present

## 2021-02-06 DIAGNOSIS — E785 Hyperlipidemia, unspecified: Secondary | ICD-10-CM | POA: Diagnosis not present

## 2021-02-06 DIAGNOSIS — E039 Hypothyroidism, unspecified: Secondary | ICD-10-CM | POA: Insufficient documentation

## 2021-02-06 DIAGNOSIS — Z96611 Presence of right artificial shoulder joint: Secondary | ICD-10-CM | POA: Diagnosis not present

## 2021-02-06 DIAGNOSIS — Z87891 Personal history of nicotine dependence: Secondary | ICD-10-CM | POA: Diagnosis not present

## 2021-02-06 DIAGNOSIS — Z794 Long term (current) use of insulin: Secondary | ICD-10-CM | POA: Insufficient documentation

## 2021-02-06 DIAGNOSIS — E1169 Type 2 diabetes mellitus with other specified complication: Secondary | ICD-10-CM | POA: Insufficient documentation

## 2021-02-06 DIAGNOSIS — Z79899 Other long term (current) drug therapy: Secondary | ICD-10-CM | POA: Diagnosis not present

## 2021-02-06 DIAGNOSIS — J449 Chronic obstructive pulmonary disease, unspecified: Secondary | ICD-10-CM | POA: Insufficient documentation

## 2021-02-06 DIAGNOSIS — Z96653 Presence of artificial knee joint, bilateral: Secondary | ICD-10-CM | POA: Insufficient documentation

## 2021-02-06 DIAGNOSIS — I10 Essential (primary) hypertension: Secondary | ICD-10-CM | POA: Insufficient documentation

## 2021-02-06 DIAGNOSIS — R531 Weakness: Secondary | ICD-10-CM | POA: Insufficient documentation

## 2021-02-06 LAB — BASIC METABOLIC PANEL
Anion gap: 8 (ref 5–15)
BUN: 31 mg/dL — ABNORMAL HIGH (ref 8–23)
CO2: 28 mmol/L (ref 22–32)
Calcium: 9.6 mg/dL (ref 8.9–10.3)
Chloride: 99 mmol/L (ref 98–111)
Creatinine, Ser: 0.98 mg/dL (ref 0.44–1.00)
GFR, Estimated: >60 mL/min (ref >60)
Glucose, Bld: 77 mg/dL (ref 70–99)
Potassium: 4.3 mmol/L (ref 3.5–5.1)
Sodium: 135 mmol/L (ref 135–145)

## 2021-02-06 LAB — HEPATIC FUNCTION PANEL
ALT: 25 U/L (ref 0–44)
AST: 25 U/L (ref 15–41)
Albumin: 4.5 g/dL (ref 3.5–5.0)
Alkaline Phosphatase: 26 U/L — ABNORMAL LOW (ref 38–126)
Bilirubin, Direct: <0.1 mg/dL (ref 0.0–0.2)
Total Bilirubin: 0.8 mg/dL (ref 0.3–1.2)
Total Protein: 7.5 g/dL (ref 6.5–8.1)

## 2021-02-06 LAB — CBC
HCT: 37.1 % (ref 36.0–46.0)
Hemoglobin: 12.5 g/dL (ref 12.0–15.0)
MCH: 32.6 pg (ref 26.0–34.0)
MCHC: 33.7 g/dL (ref 30.0–36.0)
MCV: 96.6 fL (ref 80.0–100.0)
Platelets: 343 10*3/uL (ref 150–400)
RBC: 3.84 MIL/uL — ABNORMAL LOW (ref 3.87–5.11)
RDW: 12.9 % (ref 11.5–15.5)
WBC: 12.3 10*3/uL — ABNORMAL HIGH (ref 4.0–10.5)
nRBC: 0 % (ref 0.0–0.2)

## 2021-02-06 LAB — TROPONIN I (HIGH SENSITIVITY): Troponin I (High Sensitivity): 9 ng/L (ref <18)

## 2021-02-06 IMAGING — CT CT HEAD W/O CM
3 series · 15 of 47 positions shown, 18 images · non-contrast
Comparison: None.

CLINICAL DATA: Mental status change

Weakness
Increased memory loss
EXAM:
CT HEAD WITHOUT CONTRAST
TECHNIQUE: Contiguous axial images were obtained from the base of the skull
through the vertex without intravenous contrast.

[Series 3: head wo · axial · 0.43mm/px · z∈[+306,+431]mm · 9 of 31 slices shown, 12 images]
[im 3/31  brain]
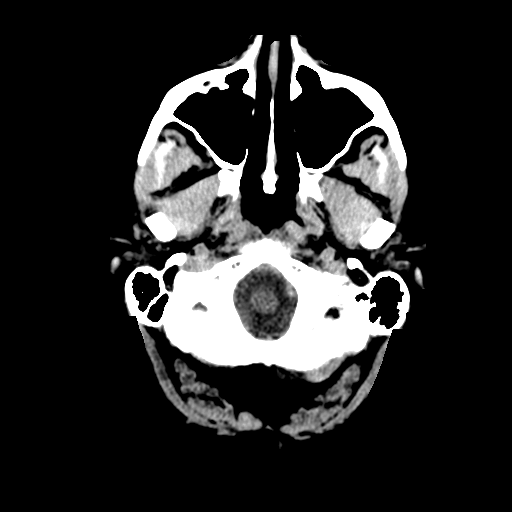
[im 3/31  bone]
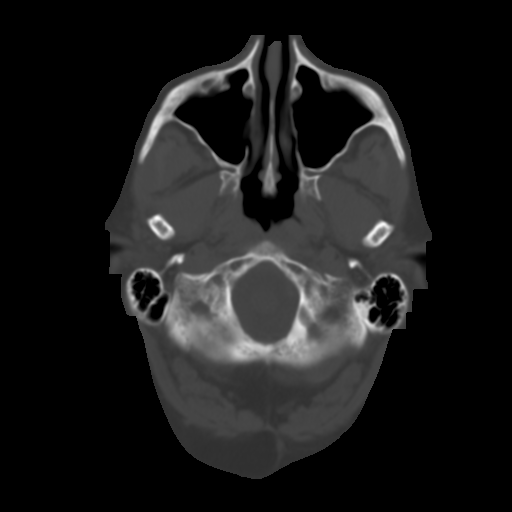
[im 6/31  brain]
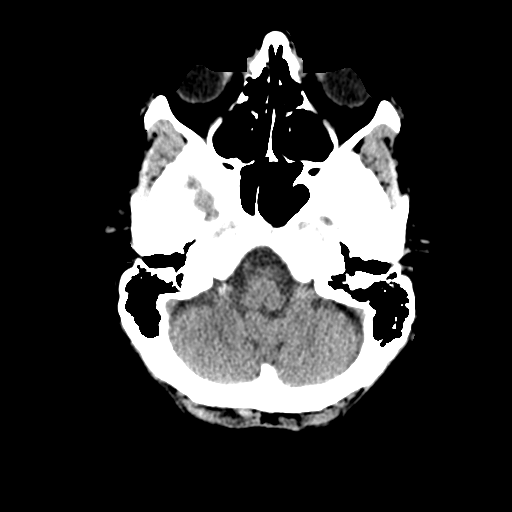
[im 9/31  brain]
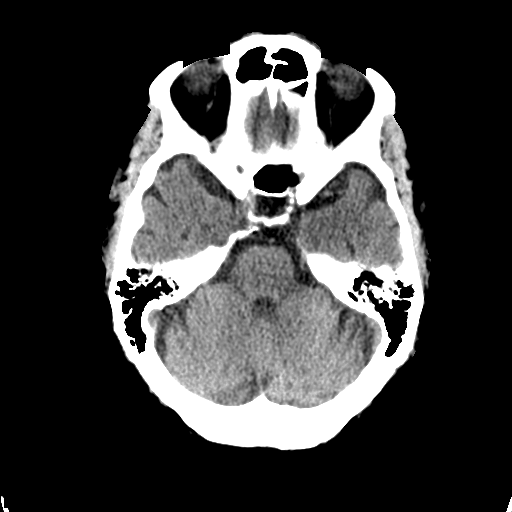
[im 12/31  brain]
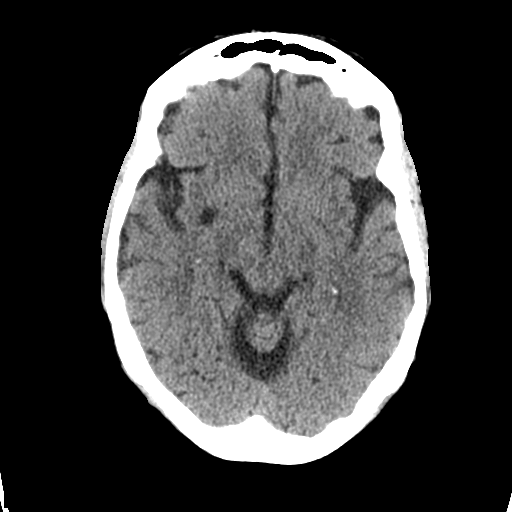
[im 16/31  brain]
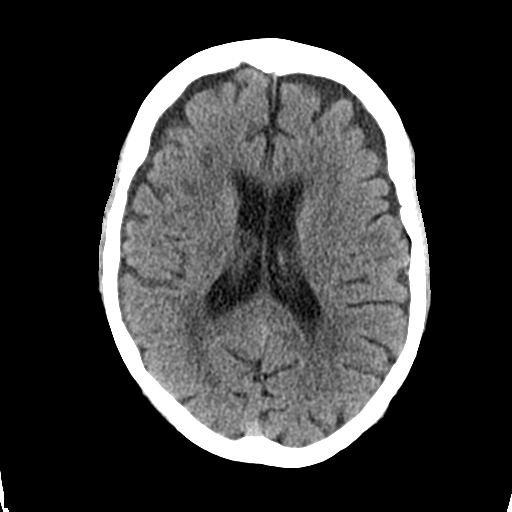
[im 16/31  bone]
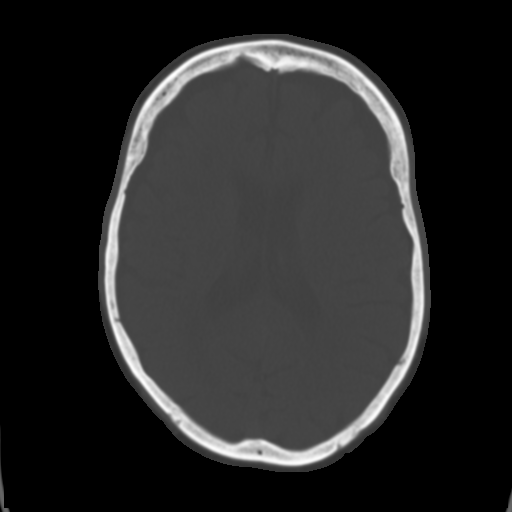
[im 19/31  brain]
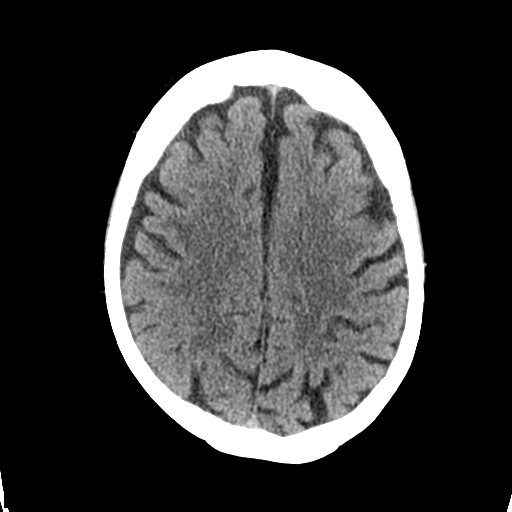
[im 22/31  brain]
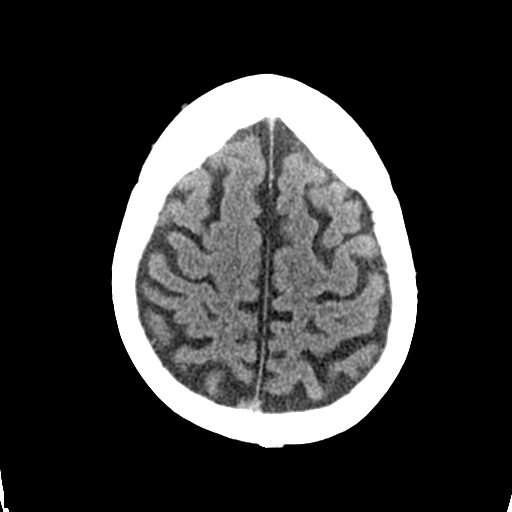
[im 25/31  brain]
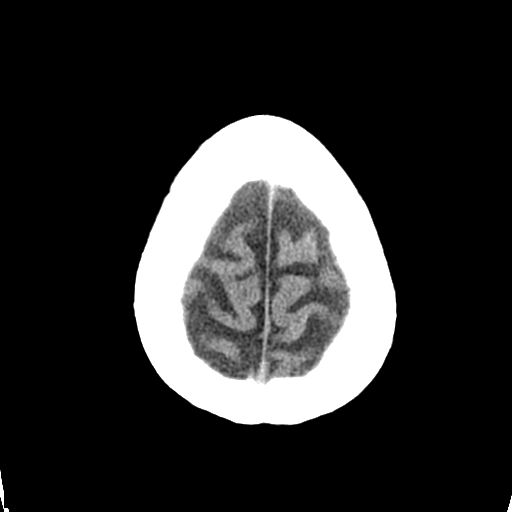
[im 28/31  brain]
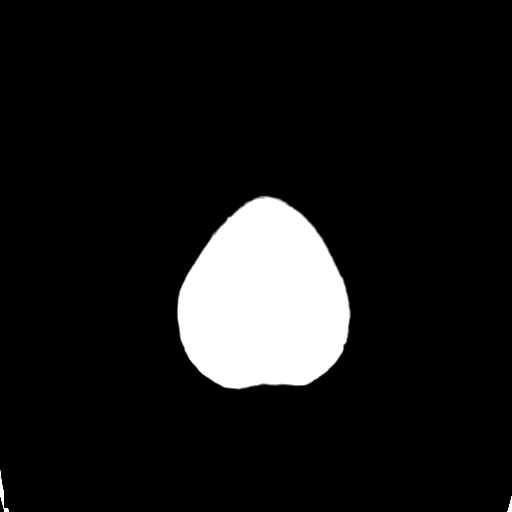
[im 28/31  bone]
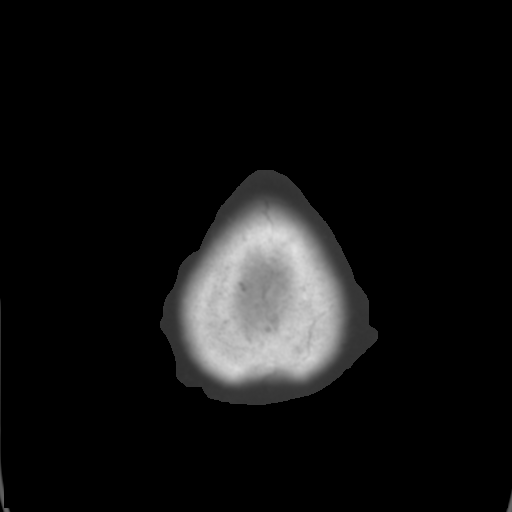

[Series 4: coronal soft tissue · coronal · 0.29mm/px · 3 of 68 slices shown]
[im 23/68  brain]
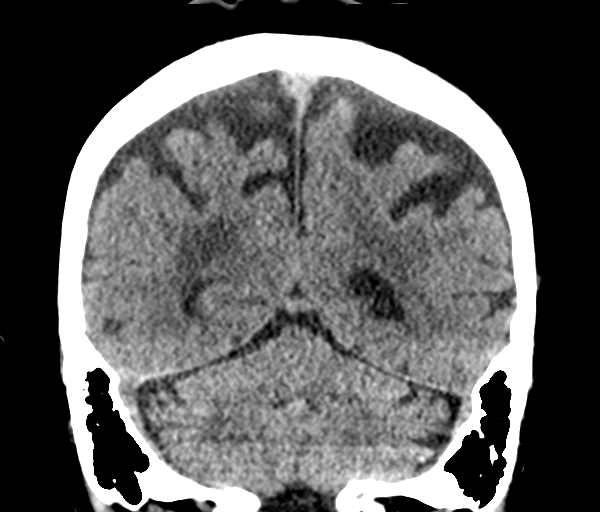
[im 30/68  brain]
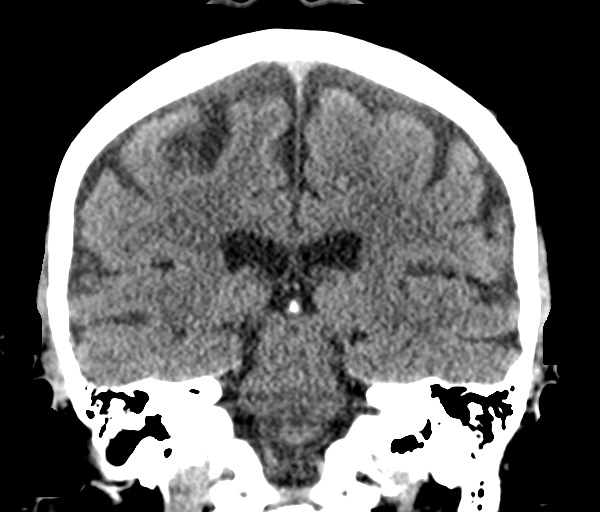
[im 38/68  brain]
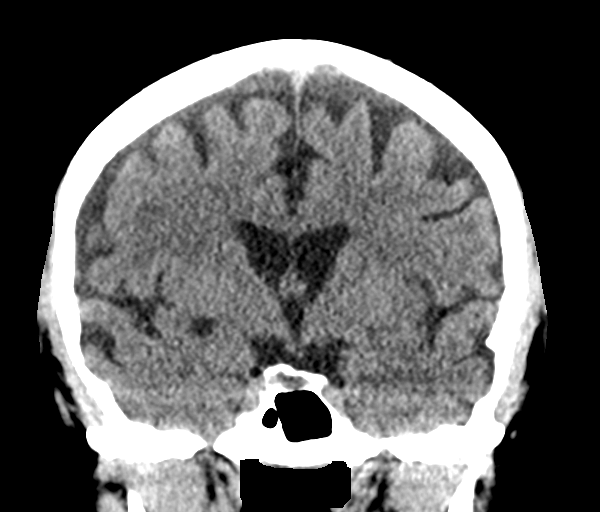

[Series 5: sagittal soft tissue · sagittal · 0.29mm/px · 3 of 58 slices shown]
[im 20/58  brain]
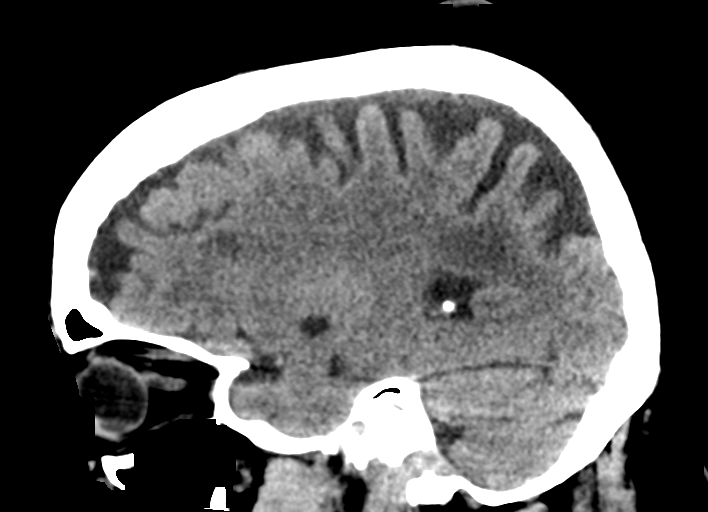
[im 29/58  brain]
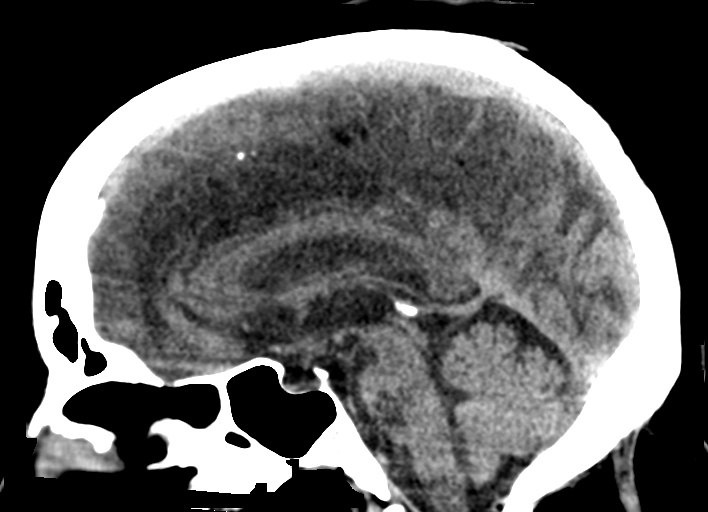
[im 39/58  brain]
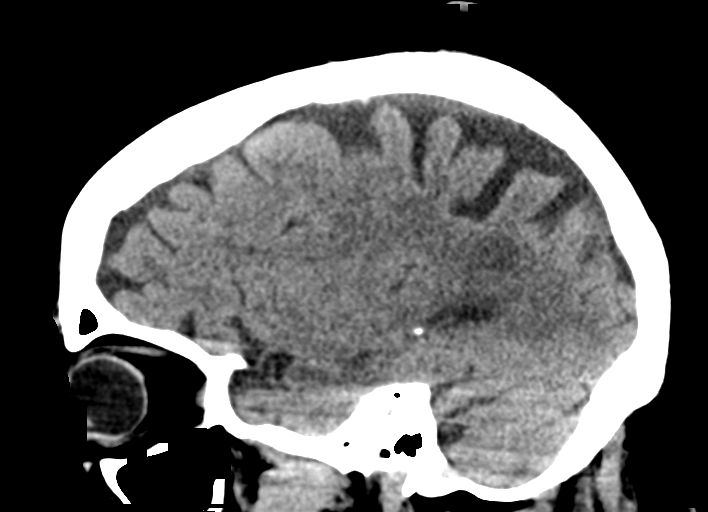

[15 of 47 positions shown; findings below may reference images not displayed]

FINDINGS: Brain: No evidence of acute infarction, hemorrhage, hydrocephalus,
extra-axial collection or mass lesion/mass effect. Periventricular
white matter hypodensity is a nonspecific finding, but most commonly
relates to chronic ischemic small vessel disease. Diffuse cortical
atrophy.

Vascular: No hyperdense vessel or unexpected calcification.

Skull: Normal. Negative for fracture or focal lesion.

Sinuses/Orbits: No acute finding.

Other: None.
IMPRESSION: No acute intracranial abnormality.

## 2021-02-06 NOTE — ED Provider Notes (Signed)
Emergency Medicine Provider Triage Evaluation Note  Angela Fernandez , a 75 y.o. female  was evaluated in triage.  Pt complains of confusion.  Patient presented with her daughter who was concerned that the patient seems more confused than normal.  Patient does have some baseline confusion, typically does not recognize it, however patient is now recognizing that she is slightly confused.  Patient is also complaining of some dizziness.  Has had frequent UTIs in the past but denies any of her typical normal UTI symptoms.  No chest pain, shortness of breath, fevers or chills, URI symptoms reported..  Review of Systems  Positive: Altered mental status Negative: No falls, no recent illnesses, no chest pain  Physical Exam  BP (!) 164/80 (BP Location: Right Arm)   Pulse 63   Temp 98.1 F (36.7 C) (Oral)   Resp 19   Ht 5\' 4"  (1.626 m)   Wt 90.7 kg   SpO2 100%   BMI 34.33 kg/m  Gen:   Awake, no distress   Resp:  Normal effort  MSK:   Moves extremities without difficulty  Other:  Cranial nerves intact at this time  Medical Decision Making  Medically screening exam initiated at 3:39 PM.  Appropriate orders placed.  Nyrie Sigal was informed that the remainder of the evaluation will be completed by another provider, this initial triage assessment does not replace that evaluation, and the importance of remaining in the ED until their evaluation is complete.  Patient arrives with increased confusion, dizziness.  History of confusion but now patient is recognizing that she is confused.  Complaining of spinning sensation.  No other significant complaints.  Patient will have labs, imaging at this time.   Darletta Moll, PA-C 02/06/21 1543    Vanessa [place], MD 02/06/21 [phone]

## 2021-02-06 NOTE — Telephone Encounter (Signed)
Daughter reports pt. Has had some weakness to her legs this week and increased memory loss. BP 136/76 and blood sugar today 112. Appointment made as requested.

## 2021-02-06 NOTE — ED Provider Notes (Signed)
[facility] Emergency Department Provider Note   ____________________________________________   Event Date/Time   First MD Initiated Contact with Patient 02/06/21 2033     (approximate)  I have reviewed the triage vital signs and the nursing notes.   HISTORY  Chief Complaint Weakness    HPI Angela Fernandez is a 75 y.o. female with past medical history of hypertension, hyperlipidemia, diabetes, COPD, venous insufficiency, and schizoaffective disorder who presents to the ED complaining of dizziness and weakness.  Patient reports that she has been feeling intermittently dizzy, weak, and confused for the past 3 days.  She states she will sometimes feel lightheaded and sometimes feel like the room is spinning around her, does not quite feel like she is going to pass out but does feel occasionally unsteady on her feet.  That sensation is not associated with changes in position.  She denies any associated vision changes, speech changes, numbness, or focal weakness.  She does state that she has been feeling weak in general with occasional confusion.  She states that the symptoms seem to be worse when she wakes up in the morning but improves throughout the day.  Her dizziness and weakness feel improved from the time that she arrived in the ED and she was able to walk to the bathroom with minimal difficulty a moment ago.  She denies any fevers, cough, chest pain, shortness of breath, vomiting, diarrhea, or dysuria.  She has not had any recent changes to her medications.        Past Medical History:  Diagnosis Date   Anemia    Bipolar affect, depressed ([place])    COPD (chronic obstructive pulmonary disease) ([place])    Diabetes mellitus without complication ([place])    type II   GERD (gastroesophageal reflux disease)    Hyperlipidemia    Hypertension    Hypothyroidism    PTSD (post-traumatic stress disorder)    Schizoaffective disorder ([place])    TIA (transient ischemic  attack) 2010    Patient Active Problem List   Diagnosis Date Noted   DJD (degenerative joint disease) of cervical spine 12/28/2020   Chronic venous insufficiency 12/24/2020   Lymphedema 12/24/2020   Diabetes ([place]) 12/24/2020   Hyperlipidemia 12/24/2020   Essential hypertension 12/24/2020   Closed fracture of neck of right scapula 10/17/2020   Status post reverse total shoulder replacement, right 09/02/2020   Rotator cuff arthropathy, right 07/14/2020    Past Surgical History:  Procedure Laterality Date   BILATERAL CARPAL TUNNEL RELEASE Bilateral    BREAST SURGERY Right    lumpectomy   CATARACT EXTRACTION W/ INTRAOCULAR LENS  IMPLANT, BILATERAL Bilateral    CHOLECYSTECTOMY     COLONOSCOPY     ESOPHAGOGASTRODUODENOSCOPY     EYE SURGERY     JOINT REPLACEMENT     NASAL SINUS SURGERY     REPLACEMENT TOTAL KNEE BILATERAL Bilateral    REVERSE SHOULDER ARTHROPLASTY Right 09/02/2020   Procedure: REVERSE SHOULDER ARTHROPLASTY;  Surgeon: Corky Mull, MD;  Location: [facility] ORS;  Service: Orthopedics;  Laterality: Right;   SHOULDER SURGERY Right 2020   rotator cuff repair    Prior to Admission medications   Medication Sig Start Date End Date Taking? Authorizing Provider  acetaminophen (TYLENOL) 325 MG tablet Take 650 mg by mouth every 4 (four) hours as needed.    [provider]  albuterol (VENTOLIN HFA) 108 (90 Base) MCG/ACT inhaler Inhale 2 puffs into the lungs every 6 (six) hours as needed for wheezing  or shortness of breath. 02/19/20   Juline Patch, MD  buPROPion (WELLBUTRIN SR) 200 MG 12 hr tablet Take 1 tablet (200 mg total) by mouth 2 (two) times daily. Take 1 bid    [provider]  clonazePAM (KLONOPIN) 1 MG tablet Take 1.5 mg by mouth daily. Take 1.5 tabs in the am, 1 tabs at noon, and 1 tab at bedtime/ psych    [provider]  cyclobenzaprine (FLEXERIL) 10 MG tablet Take 1 tablet (10 mg total) by mouth at bedtime. 09/06/20   Reche Dixon, PA-C   desvenlafaxine (PRISTIQ) 50 MG 24 hr tablet Take 50 mg by mouth at bedtime.    [provider]  docusate sodium (COLACE) 100 MG capsule Take 200 mg by mouth 2 (two) times daily.    [provider]  fenofibrate (TRICOR) 145 MG tablet Take 1 tablet (145 mg total) by mouth daily. 4pm 10/24/20   Juline Patch, MD  ferrous sulfate 325 (65 FE) MG tablet Take 1 tablet (325 mg total) by mouth daily with breakfast. 01/26/21   Juline Patch, MD  Glucagon, rDNA, (GLUCAGON EMERGENCY) 1 MG KIT Inject 1 Device as directed as needed (hypoglycemia). 04/18/20   Juline Patch, MD  insulin glargine (LANTUS SOLOSTAR) 100 UNIT/ML Solostar Pen Inject 30 Units into the skin daily. 30 units daily into skin Patient taking differently: Inject 24 Units into the skin daily. 30 units daily into skin 04/18/20   Juline Patch, MD  lactulose (CHRONULAC) 10 GM/15ML solution Take 10 g by mouth daily as needed. 02/19/20   [provider]  levothyroxine (SYNTHROID) 175 MCG tablet Take 1 tablet (175 mcg total) by mouth daily before breakfast. 10/24/20   Juline Patch, MD  lisinopril (ZESTRIL) 5 MG tablet Take 1 tablet (5 mg total) by mouth daily. 10/24/20   Juline Patch, MD  Lurasidone HCl (LATUDA) 120 MG TABS Take 1 tablet by mouth daily. Psych Takes at 4 pm    [provider]  meloxicam (MOBIC) 7.5 MG tablet Take 1 tablet (7.5 mg total) by mouth daily. Patient taking differently: Take 7.5 mg by mouth daily. May take another dose in the afternoon 02/19/20   Juline Patch, MD  metoprolol succinate (TOPROL-XL) 100 MG 24 hr tablet Take 1 tablet (100 mg total) by mouth daily. Take with or immediately following a meal. 10/24/20   Juline Patch, MD  Multiple Vitamins-Minerals (HAIR SKIN AND NAILS FORMULA PO) Take 1 tablet by mouth at bedtime.    [provider]  Multiple Vitamins-Minerals (PRESERVISION AREDS PO) Take 1 capsule by mouth in the morning and at bedtime.    [provider]  omeprazole (PRILOSEC) 40 MG capsule Take 1 capsule (40 mg total) by mouth daily. 01/26/21   Juline Patch, MD  OVER THE COUNTER MEDICATION Take 1 Scoop by mouth daily in the afternoon. Collagen protein powder    [provider]  Polyethyl Glycol-Propyl Glycol (SYSTANE OP) Apply 2 drops to eye 3 (three) times daily as needed.    [provider]  simvastatin (ZOCOR) 20 MG tablet Take 1 tablet (20 mg total) by mouth daily. 10/24/20   Juline Patch, MD  traMADol (ULTRAM) 50 MG tablet Take 1 tablet (50 mg total) by mouth every 6 (six) hours as needed for moderate pain. 09/06/20   Reche Dixon, PA-C  traZODone (DESYREL) 50 MG tablet Take 50 mg by mouth at bedtime. Take one tablet at bedtime/ psych  [provider]  TRULICITY 3.53 IR/4.4RX SOPN INJECT 1 SYRINGEFUL SUBCUTANEOUSLY ONCE A WEEK 12/24/20   Juline Patch, MD  umeclidinium-vilanterol (ANORO ELLIPTA) 62.5-25 MCG/INH AEPB Inhale 1 puff into the lungs daily.    [provider]  zolpidem (AMBIEN) 5 MG tablet Take 5 mg by mouth at bedtime.    [provider]    Allergies Patient has no known allergies.  Family History  Problem Relation Age of Onset   Pancreatitis Father     Social History Social History   Tobacco Use   Smoking status: Former    Years: 54.00    Types: Cigarettes    Quit date: 05/10/2016    Years since quitting: 4.7   Smokeless tobacco: Never  Vaping Use   Vaping Use: Never used  Substance Use Topics   Alcohol use: Not Currently    Comment: quit in age 86's or 56's   Drug use: Not Currently    Types: Cocaine, Heroin, Marijuana, LSD    Comment: last used in age 81's    Review of Systems  Constitutional: No fever/chills.  Positive for generalized weakness. Eyes: No visual changes. ENT: No sore throat. Cardiovascular: Denies chest pain. Respiratory: Denies shortness of breath. Gastrointestinal: No abdominal pain.  No nausea, no vomiting.  No  diarrhea.  No constipation. Genitourinary: Negative for dysuria. Musculoskeletal: Negative for back pain. Skin: Negative for rash. Neurological: Negative for headaches, focal weakness or numbness.  Positive for dizziness and confusion.  ____________________________________________   PHYSICAL EXAM:  VITAL SIGNS: ED Triage Vitals  Enc Vitals Group     BP 02/06/21 1514 (!) 164/80     Pulse Rate 02/06/21 1514 63     Resp 02/06/21 1514 19     Temp 02/06/21 1514 98.1 F (36.7 C)     Temp Source 02/06/21 1514 Oral     SpO2 02/06/21 1514 100 %     Weight 02/06/21 1516 200 lb (90.7 kg)     Height 02/06/21 1516 5' 4"  (1.626 m)     Head Circumference --      Peak Flow --      Pain Score 02/06/21 1515 0     Pain Loc --      Pain Edu? --      Excl. in [place]? --     Constitutional: Alert and oriented to person, place, time, and situation. Eyes: Conjunctivae are normal. Head: Atraumatic. Nose: No congestion/rhinnorhea. Mouth/Throat: Mucous membranes are moist. Neck: Normal ROM Cardiovascular: Normal rate, regular rhythm. Grossly normal heart sounds.  2+ radial pulses bilaterally. Respiratory: Normal respiratory effort.  No retractions. Lungs CTAB. Gastrointestinal: Soft and nontender. No distention. Genitourinary: deferred Musculoskeletal: No lower extremity tenderness nor edema. Neurologic:  Normal speech and language. No gross focal neurologic deficits are appreciated.  Ambulates with a steady gait. Skin:  Skin is warm, dry and intact. No rash noted. Psychiatric: Mood and affect are normal. Speech and behavior are normal.  ____________________________________________   LABS (all labs ordered are listed, but only abnormal results are displayed)  Labs Reviewed  BASIC METABOLIC PANEL - Abnormal; Notable for the following components:      Result Value   BUN 31 (*)    All other components within normal limits  CBC - Abnormal; Notable for the following components:   WBC 12.3 (*)     RBC 3.84 (*)    All other components within normal limits  URINALYSIS, COMPLETE (UACMP) WITH MICROSCOPIC - Abnormal; Notable for the following  components:   Color, Urine STRAW (*)    APPearance CLEAR (*)    Specific Gravity, Urine 1.003 (*)    Leukocytes,Ua TRACE (*)    All other components within normal limits  HEPATIC FUNCTION PANEL - Abnormal; Notable for the following components:   Alkaline Phosphatase 26 (*)    All other components within normal limits  CBG MONITORING, ED  TROPONIN I (HIGH SENSITIVITY)   ____________________________________________  EKG  ED ECG REPORT I, Blake Divine, the attending physician, personally viewed and interpreted this ECG.   Date: 02/06/2021  EKG Time: 15:27  Rate: 73  Rhythm: normal sinus rhythm  Axis: Normal  Intervals:none  ST&T Change: None   PROCEDURES  Procedure(s) performed (including Critical Care):  Procedures   ____________________________________________   INITIAL IMPRESSION / ASSESSMENT AND PLAN / ED COURSE      76 year old female with past medical history of hypertension, hyperlipidemia, diabetes, COPD, venous insufficiency, and schizoaffective disorder who presents to the ED with intermittent dizziness, generalized weakness, and confusion for the past 3 days.  Patient is currently alert and oriented x4 with no focal neurologic deficits on exam.  She states her dizziness is improving and she is able to ambulate with a steady gait.  Work-up thus far has been unremarkable, EKG shows no evidence of arrhythmia or ischemia, troponin and remainder of labs are reassuring.  UA does not appear consistent with infection.  CT head is negative for acute process.  Patient and daughter were offered MRI here in the ED to rule out posterior stroke, but they declined and preferred to follow-up as an outpatient with PCP to consider outpatient MRI.  This is reasonable given overall suspicion for posterior stroke is low with improving  symptoms.  She is appropriate for discharge home and was counseled to return to the ED for new worsening symptoms, patient and daughter agree with plan.      ____________________________________________   FINAL CLINICAL IMPRESSION(S) / ED DIAGNOSES  Final diagnoses:  Generalized weakness  Dizziness     ED Discharge Orders     None        Note:  This document was prepared using Dragon voice recognition software and may include unintentional dictation errors.    Blake Divine, MD 02/06/21 2111

## 2021-02-06 NOTE — ED Triage Notes (Addendum)
Pt presents to ED with c/o of feeling confused. Daughter with pt and states the same. Pt denies urinary symptoms but denies any issues with dysuria or fever or chills. Pt denies N/V/D.    Pt also c/o of dizziness, like room spinning dizzy.

## 2021-02-06 NOTE — Telephone Encounter (Signed)
Reason for Disposition  [1] Worsening confusion AND [2] gradual onset (days to weeks)  Answer Assessment - Initial Assessment Questions 1. MAIN CONCERN OR SYMPTOM:  "What is your main concern right now?" "What questions do you have?" "What's the main symptom you're worried about?" (e.g., confusion, memory loss)     Memory loss 2. ONSET:  "When did the symptom start (or worsen)?" (minutes, hours, days, weeks)     1 month ago 3. BETTER-SAME-WORSE: "Are you (the patient) getting better, staying the same, or getting worse compared to the day you (they) were diagnosed or most recent hospital discharge ?"     Same 4. DIAGNOSIS: "Was the dementia diagnosed by a doctor?" If Yes, ask: "When?" (e.g., days, months, years ago)     No 5. MEDICATION: "Has there been any change in medicines recently?" (e.g., narcotics, antihistamines, benzodiazepines, etc.)     No 6. OTHER SYMPTOMS: "Are there any other symptoms?" (e.g., fever, cough, pain, falling)     Legs feel weak 7. SUPPORT: Document living circumstances and support (e.g., family, nursing home)     Lives with family  Protocols used: Dementia Symptoms and Questions-A-AH

## 2021-02-06 NOTE — ED Notes (Signed)
Pt comes in for confusion recently & weakness.

## 2021-02-06 NOTE — ED Notes (Signed)
Pt attempted UA but missed hat

## 2021-02-07 LAB — URINALYSIS, COMPLETE (UACMP) WITH MICROSCOPIC
Bilirubin Urine: NEGATIVE
Glucose, UA: NEGATIVE mg/dL
Hgb urine dipstick: NEGATIVE
Ketones, ur: NEGATIVE mg/dL
Nitrite: NEGATIVE
Protein, ur: NEGATIVE mg/dL
Specific Gravity, Urine: 1.003 — ABNORMAL LOW (ref 1.005–1.030)
pH: 6 (ref 5.0–8.0)

## 2021-02-09 DIAGNOSIS — M25511 Pain in right shoulder: Secondary | ICD-10-CM | POA: Diagnosis not present

## 2021-02-09 DIAGNOSIS — Z96611 Presence of right artificial shoulder joint: Secondary | ICD-10-CM | POA: Diagnosis not present

## 2021-02-10 DIAGNOSIS — E119 Type 2 diabetes mellitus without complications: Secondary | ICD-10-CM | POA: Diagnosis not present

## 2021-02-11 ENCOUNTER — Ambulatory Visit (INDEPENDENT_AMBULATORY_CARE_PROVIDER_SITE_OTHER): Payer: Medicare Other | Admitting: Certified Nurse Midwife

## 2021-02-11 ENCOUNTER — Encounter: Payer: Self-pay | Admitting: Certified Nurse Midwife

## 2021-02-11 ENCOUNTER — Other Ambulatory Visit: Payer: Self-pay

## 2021-02-11 VITALS — BP 120/74 | HR 65 | Ht 60.0 in | Wt 187.9 lb

## 2021-02-11 DIAGNOSIS — R102 Pelvic and perineal pain: Secondary | ICD-10-CM | POA: Diagnosis not present

## 2021-02-11 DIAGNOSIS — Z9889 Other specified postprocedural states: Secondary | ICD-10-CM | POA: Diagnosis not present

## 2021-02-11 NOTE — Patient Instructions (Signed)
Kegel Exercises  Kegel exercises can help strengthen your pelvic floor muscles. The pelvic floor is a group of muscles that support your rectum, small intestine, and bladder. In females, pelvic floor muscles also help support the womb (uterus). These muscles help you control the flow of urine and stool. Kegel exercises are painless and simple, and they do not require any equipment. Your provider may suggest Kegel exercises to: Improve bladder and bowel control. Improve sexual response. Improve weak pelvic floor muscles after surgery to remove the uterus (hysterectomy) or pregnancy (females). Improve weak pelvic floor muscles after prostate gland removal or surgery (males). Kegel exercises involve squeezing your pelvic floor muscles, which are the same muscles you squeeze when you try to stop the flow of urine or keep from passing gas. The exercises can be done while sitting, standing, or lying down, but itis best to vary your position. Exercises How to do Kegel exercises: Squeeze your pelvic floor muscles tight. You should feel a tight lift in your rectal area. If you are a female, you should also feel a tightness in your vaginal area. Keep your stomach, buttocks, and legs relaxed. Hold the muscles tight for up to 10 seconds. Breathe normally. Relax your muscles. Repeat as told by your health care provider. Repeat this exercise daily as told by your health care provider. Continue to do this exercise for at least 4-6 weeks, or for as long as told by your healthcare provider. You may be referred to a physical therapist who can help you learn more abouthow to do Kegel exercises. Depending on your condition, your health care provider may recommend: Varying how long you squeeze your muscles. Doing several sets of exercises every day. Doing exercises for several weeks. Making Kegel exercises a part of your regular exercise routine. This information is not intended to replace advice given to you by  your health care provider. Make sure you discuss any questions you have with your healthcare provider. Document Revised: 04/16/2020 Document Reviewed: 12/14/2017 Elsevier Patient Education  2022 Elsevier Inc.  

## 2021-02-11 NOTE — Progress Notes (Signed)
GYNECOLOGY ANNUAL PREVENTATIVE CARE ENCOUNTER NOTE  History:     Angela Fernandez is a 75 y.o. No obstetric history on file. female here for a routine annual gynecologic exam.  Current complaints: pelvic pressure. Pt state that this has been an ongoing problem for a few years. She denies worsening. She has also experiences urinary urgency and frequent UTIs. She denies symptoms of UTI today.   Denies abnormal vaginal bleeding, discharge, pelvic pain, problems with intercourse or other gynecologic concerns.     Gynecologic History No LMP recorded. Patient is postmenopausal. Contraception: post menopausal status Last Pap: n/a.  Last mammogram: 5 yrs ago. Pt state she had a mass , a clip was placed. This was done in [place]. She states she was supposed to have a follow up mammogram but did not. She is requesting an mammogram . Orders placed.    Obstetric History OB History  No obstetric history on file.    Past Medical History:  Diagnosis Date   Anemia    Bipolar affect, depressed ([place])    COPD (chronic obstructive pulmonary disease) ([place])    Diabetes mellitus without complication ([facility])    type II   GERD (gastroesophageal reflux disease)    Hyperlipidemia    Hypertension    Hypothyroidism    PTSD (post-traumatic stress disorder)    Schizoaffective disorder ([facility])    TIA (transient ischemic attack) 2010    Past Surgical History:  Procedure Laterality Date   BILATERAL CARPAL TUNNEL RELEASE Bilateral    BREAST SURGERY Right    lumpectomy   CATARACT EXTRACTION W/ INTRAOCULAR LENS  IMPLANT, BILATERAL Bilateral    CHOLECYSTECTOMY     COLONOSCOPY     ESOPHAGOGASTRODUODENOSCOPY     EYE SURGERY     JOINT REPLACEMENT     NASAL SINUS SURGERY     REPLACEMENT TOTAL KNEE BILATERAL Bilateral    REVERSE SHOULDER ARTHROPLASTY Right 09/02/2020   Procedure: REVERSE SHOULDER ARTHROPLASTY;  Surgeon: Corky Mull, MD;  Location: [facility] ORS;  Service: Orthopedics;  Laterality: Right;    SHOULDER SURGERY Right 2020   rotator cuff repair    Current Outpatient Medications on File Prior to Visit  Medication Sig Dispense Refill   acetaminophen (TYLENOL) 325 MG tablet Take 650 mg by mouth every 4 (four) hours as needed.     albuterol (VENTOLIN HFA) 108 (90 Base) MCG/ACT inhaler Inhale 2 puffs into the lungs every 6 (six) hours as needed for wheezing or shortness of breath. 8 g 2   buPROPion (WELLBUTRIN SR) 200 MG 12 hr tablet Take 1 tablet (200 mg total) by mouth 2 (two) times daily. Take 1 bid     clonazePAM (KLONOPIN) 1 MG tablet Take 1.5 mg by mouth daily. Take 1.5 tabs in the am, 1 tabs at noon, and 1 tab at bedtime/ psych     cyclobenzaprine (FLEXERIL) 10 MG tablet Take 1 tablet (10 mg total) by mouth at bedtime. 30 tablet 0   desvenlafaxine (PRISTIQ) 50 MG 24 hr tablet Take 50 mg by mouth at bedtime.     docusate sodium (COLACE) 100 MG capsule Take 200 mg by mouth 2 (two) times daily.     fenofibrate (TRICOR) 145 MG tablet Take 1 tablet (145 mg total) by mouth daily. 4pm 90 tablet 1   ferrous sulfate 325 (65 FE) MG tablet Take 1 tablet (325 mg total) by mouth daily with breakfast. 90 tablet 0   Glucagon, rDNA, (GLUCAGON EMERGENCY) 1 MG KIT Inject 1  Device as directed as needed (hypoglycemia). 1 kit 0   lactulose (CHRONULAC) 10 GM/15ML solution Take 10 g by mouth daily as needed.     levothyroxine (SYNTHROID) 175 MCG tablet Take 1 tablet (175 mcg total) by mouth daily before breakfast. 90 tablet 1   lisinopril (ZESTRIL) 5 MG tablet Take 1 tablet (5 mg total) by mouth daily. 90 tablet 1   Lurasidone HCl (LATUDA) 120 MG TABS Take 1 tablet by mouth daily. Psych Takes at 4 pm     metoprolol succinate (TOPROL-XL) 100 MG 24 hr tablet Take 1 tablet (100 mg total) by mouth daily. Take with or immediately following a meal. 90 tablet 1   Multiple Vitamins-Minerals (HAIR SKIN AND NAILS FORMULA PO) Take 1 tablet by mouth at bedtime.     Multiple Vitamins-Minerals (PRESERVISION AREDS PO)  Take 1 capsule by mouth in the morning and at bedtime.     omeprazole (PRILOSEC) 40 MG capsule Take 1 capsule (40 mg total) by mouth daily. 90 capsule 0   OVER THE COUNTER MEDICATION Take 1 Scoop by mouth daily in the afternoon. Collagen protein powder     Polyethyl Glycol-Propyl Glycol (SYSTANE OP) Apply 2 drops to eye 3 (three) times daily as needed.     simvastatin (ZOCOR) 20 MG tablet Take 1 tablet (20 mg total) by mouth daily. 90 tablet 1   traMADol (ULTRAM) 50 MG tablet Take 1 tablet (50 mg total) by mouth every 6 (six) hours as needed for moderate pain. 30 tablet 0   traZODone (DESYREL) 50 MG tablet Take 50 mg by mouth at bedtime. Take one tablet at bedtime/ psych     TRULICITY 2.35 TD/3.2KG SOPN INJECT 1 SYRINGEFUL SUBCUTANEOUSLY ONCE A WEEK 4 mL 0   umeclidinium-vilanterol (ANORO ELLIPTA) 62.5-25 MCG/INH AEPB Inhale 1 puff into the lungs daily.     zolpidem (AMBIEN) 5 MG tablet Take 5 mg by mouth at bedtime.     insulin glargine (LANTUS SOLOSTAR) 100 UNIT/ML Solostar Pen Inject 30 Units into the skin daily. 30 units daily into skin (Patient not taking: Reported on 02/11/2021) 15 mL 1   meloxicam (MOBIC) 7.5 MG tablet Take 1 tablet (7.5 mg total) by mouth daily. (Patient not taking: Reported on 02/11/2021) 90 tablet 0   No current facility-administered medications on file prior to visit.    No Known Allergies  Social History:  reports that she quit smoking about 4 years ago. Her smoking use included cigarettes. She has never used smokeless tobacco. She reports that she does not currently use alcohol. She reports that she does not currently use drugs after having used the following drugs: Cocaine, Heroin, Marijuana, and LSD.  Family History  Problem Relation Age of Onset   Pancreatitis Father     The following portions of the patient's history were reviewed and updated as appropriate: allergies, current medications, past family history, past medical history, past social history, past  surgical history and problem list.  Review of Systems Pertinent items noted in HPI and remainder of comprehensive ROS otherwise negative.  Physical Exam:  BP 120/74   Pulse 65   Ht 5' (1.524 m)   Wt 187 lb 14.4 oz (85.2 kg)   BMI 36.70 kg/m  CONSTITUTIONAL: Well-developed, well-nourished female in no acute distress.  HENT:  Normocephalic, atraumatic, External right and left ear normal. Oropharynx is clear and moist EYES: Conjunctivae and EOM are normal. Pupils are equal, round, and reactive to light. No scleral icterus.  NECK: Normal range of  motion, supple, no masses.  Normal thyroid.  SKIN: Skin is warm and dry. No rash noted. Not diaphoretic. No erythema. No pallor. MUSCULOSKELETAL: Normal range of motion. No tenderness.  No cyanosis, clubbing, or edema.  2+ distal pulses. NEUROLOGIC: Alert and oriented to person, place, and time. Normal reflexes, muscle tone coordination.  PSYCHIATRIC: Normal mood and affect. Normal behavior. Normal judgment and thought content. CARDIOVASCULAR: Normal heart rate noted, regular rhythm RESPIRATORY: Clear to auscultation bilaterally. Effort and breath sounds normal, no problems with respiration noted. BREASTS: Symmetric in size. No masses, tenderness, skin changes, nipple drainage, or lymphadenopathy bilaterally.  ABDOMEN: Soft, no distention noted.  No tenderness, rebound or guarding.  PELVIC: Normal appearing external genitalia and urethral meatus; normal appearing vaginal mucosa and cervix. Normal atrophic changes.  No abnormal discharge noted.    Normal uterine size, no other palpable masses, nothing protruding in vaginal , no uterine or adnexal tenderness.  Difficult to assess due to body hapitus   Assessment and Plan:    Pap:not indicated Mammogram : ordered Labs:u/s pelvic for pelvic pressure.  Refills: none Referral: Probable pelvic prolapse suspected discussed use of Kegel exercises, pelvic floor therapy to improve muscle tone. Discussed  use of medication for overactive bladder to help with bladder symptoms. Discussed pessary and potential for surgical options. She verbalizes and agrees. Will follow up with u/s results.  Routine preventative health maintenance measures emphasized. Please refer to After Visit Summary for other counseling recommendations.     Dr. Marcelline Mates consulted on plan of care.   Philip Aspen, CNM [facility]

## 2021-02-13 DIAGNOSIS — Z96611 Presence of right artificial shoulder joint: Secondary | ICD-10-CM | POA: Diagnosis not present

## 2021-02-13 DIAGNOSIS — M25511 Pain in right shoulder: Secondary | ICD-10-CM | POA: Diagnosis not present

## 2021-02-17 ENCOUNTER — Telehealth: Payer: Self-pay | Admitting: Certified Nurse Midwife

## 2021-02-17 DIAGNOSIS — Z96611 Presence of right artificial shoulder joint: Secondary | ICD-10-CM | POA: Diagnosis not present

## 2021-02-17 DIAGNOSIS — M25511 Pain in right shoulder: Secondary | ICD-10-CM | POA: Diagnosis not present

## 2021-02-17 NOTE — Telephone Encounter (Signed)
ERROR

## 2021-02-20 DIAGNOSIS — M25511 Pain in right shoulder: Secondary | ICD-10-CM | POA: Diagnosis not present

## 2021-02-20 DIAGNOSIS — Z96611 Presence of right artificial shoulder joint: Secondary | ICD-10-CM | POA: Diagnosis not present

## 2021-02-24 DIAGNOSIS — M25511 Pain in right shoulder: Secondary | ICD-10-CM | POA: Diagnosis not present

## 2021-02-24 DIAGNOSIS — Z96611 Presence of right artificial shoulder joint: Secondary | ICD-10-CM | POA: Diagnosis not present

## 2021-02-26 DIAGNOSIS — Z96611 Presence of right artificial shoulder joint: Secondary | ICD-10-CM | POA: Diagnosis not present

## 2021-02-26 DIAGNOSIS — M25511 Pain in right shoulder: Secondary | ICD-10-CM | POA: Diagnosis not present

## 2021-02-27 ENCOUNTER — Ambulatory Visit (INDEPENDENT_AMBULATORY_CARE_PROVIDER_SITE_OTHER): Payer: Medicare Other

## 2021-02-27 ENCOUNTER — Other Ambulatory Visit: Payer: Self-pay

## 2021-02-27 DIAGNOSIS — R102 Pelvic and perineal pain: Secondary | ICD-10-CM | POA: Diagnosis not present

## 2021-03-03 DIAGNOSIS — Z96611 Presence of right artificial shoulder joint: Secondary | ICD-10-CM | POA: Diagnosis not present

## 2021-03-03 DIAGNOSIS — M25511 Pain in right shoulder: Secondary | ICD-10-CM | POA: Diagnosis not present

## 2021-03-05 DIAGNOSIS — Z96611 Presence of right artificial shoulder joint: Secondary | ICD-10-CM | POA: Diagnosis not present

## 2021-03-05 DIAGNOSIS — M25511 Pain in right shoulder: Secondary | ICD-10-CM | POA: Diagnosis not present

## 2021-03-08 ENCOUNTER — Other Ambulatory Visit: Payer: Self-pay | Admitting: Family Medicine

## 2021-03-08 DIAGNOSIS — M1990 Unspecified osteoarthritis, unspecified site: Secondary | ICD-10-CM

## 2021-03-10 ENCOUNTER — Encounter: Payer: Self-pay | Admitting: Family Medicine

## 2021-03-10 DIAGNOSIS — Z96611 Presence of right artificial shoulder joint: Secondary | ICD-10-CM | POA: Diagnosis not present

## 2021-03-11 ENCOUNTER — Other Ambulatory Visit: Payer: Self-pay | Admitting: Certified Nurse Midwife

## 2021-03-11 DIAGNOSIS — Z1231 Encounter for screening mammogram for malignant neoplasm of breast: Secondary | ICD-10-CM

## 2021-03-12 DIAGNOSIS — Z96611 Presence of right artificial shoulder joint: Secondary | ICD-10-CM | POA: Diagnosis not present

## 2021-03-12 DIAGNOSIS — M25511 Pain in right shoulder: Secondary | ICD-10-CM | POA: Diagnosis not present

## 2021-03-17 DIAGNOSIS — M25511 Pain in right shoulder: Secondary | ICD-10-CM | POA: Diagnosis not present

## 2021-03-17 DIAGNOSIS — Z96611 Presence of right artificial shoulder joint: Secondary | ICD-10-CM | POA: Diagnosis not present

## 2021-03-19 DIAGNOSIS — Z96611 Presence of right artificial shoulder joint: Secondary | ICD-10-CM | POA: Diagnosis not present

## 2021-03-19 DIAGNOSIS — M25511 Pain in right shoulder: Secondary | ICD-10-CM | POA: Diagnosis not present

## 2021-03-23 ENCOUNTER — Other Ambulatory Visit: Payer: Self-pay

## 2021-03-23 ENCOUNTER — Ambulatory Visit
Admission: RE | Admit: 2021-03-23 | Discharge: 2021-03-23 | Disposition: A | Discharge status: Home / Self Care | Payer: Medicare Other | Source: Ambulatory Visit | Attending: Neurosurgery | Admitting: Neurosurgery

## 2021-03-23 DIAGNOSIS — M545 Low back pain, unspecified: Secondary | ICD-10-CM | POA: Diagnosis not present

## 2021-03-23 DIAGNOSIS — M542 Cervicalgia: Secondary | ICD-10-CM

## 2021-03-23 DIAGNOSIS — M5136 Other intervertebral disc degeneration, lumbar region: Secondary | ICD-10-CM

## 2021-03-23 DIAGNOSIS — M5416 Radiculopathy, lumbar region: Secondary | ICD-10-CM | POA: Insufficient documentation

## 2021-03-23 DIAGNOSIS — G959 Disease of spinal cord, unspecified: Secondary | ICD-10-CM

## 2021-03-23 IMAGING — MR MR LUMBAR SPINE W/O CM
5 series · 31 of 48 positions shown · non-contrast
Comparison: None.

CLINICAL DATA: Low back pain radiating into the left leg. No injury
or prior surgery.

EXAM:
MRI LUMBAR SPINE WITHOUT CONTRAST
TECHNIQUE: Multiplanar, multisequence MR imaging of the lumbar spine was
performed. No intravenous contrast was administered.

[Series 9: T2 · sagittal · 4.0mm · 0.81mm/px · 6 of 17 slices shown (1 of 2)]
[im 1/17]
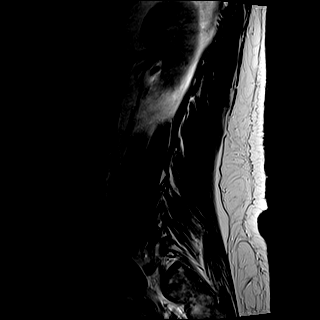
[im 4/17]
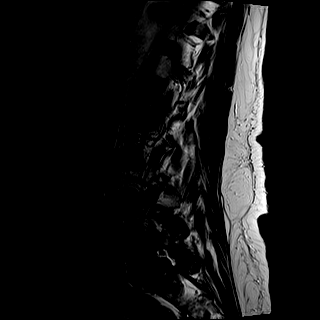
[im 7/17]
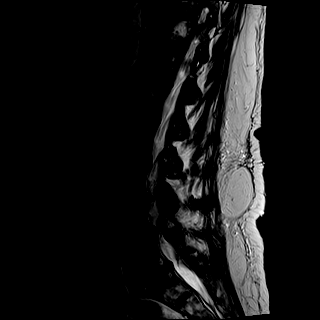
[im 10/17]
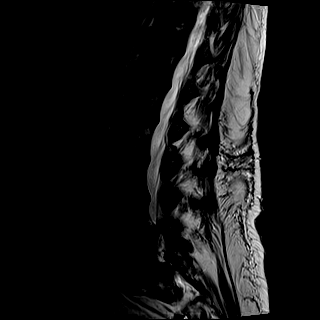
[im 13/17]
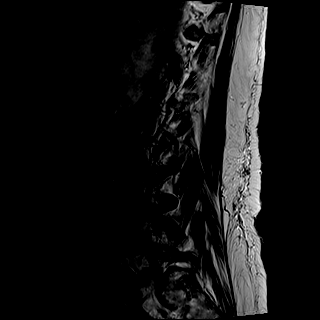
[im 17/17]
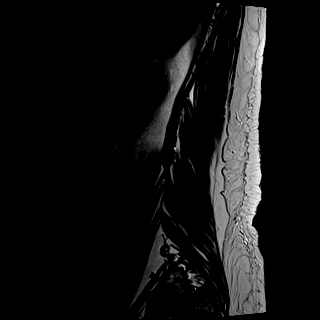

[Series 10: T1 · sagittal · 4.0mm · 0.81mm/px · 7 of 17 slices shown (1 of 2)]
[im 1/17]
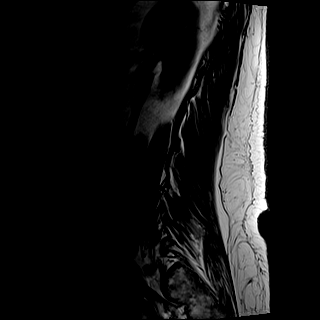
[im 3/17]
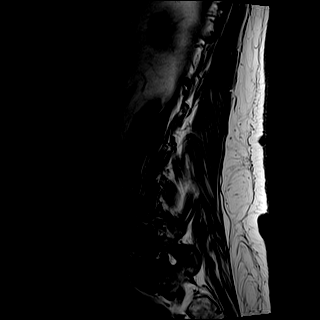
[im 6/17]
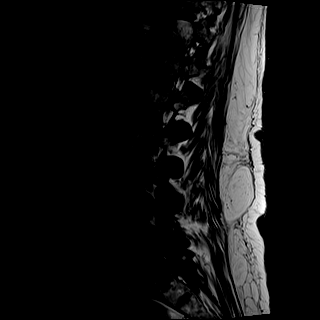
[im 9/17]
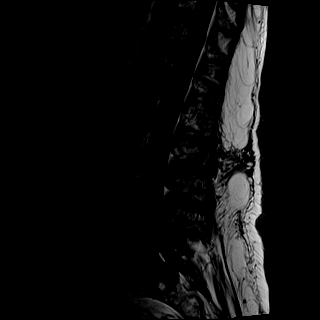
[im 11/17]
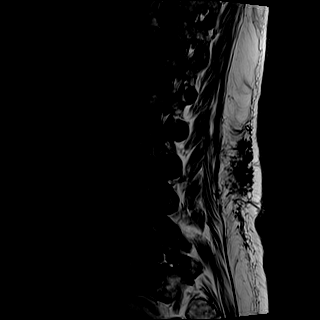
[im 14/17]
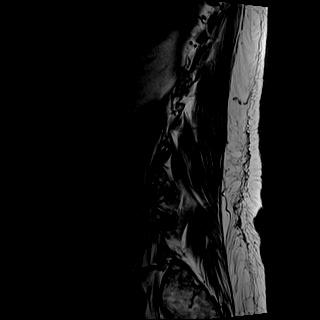
[im 17/17]
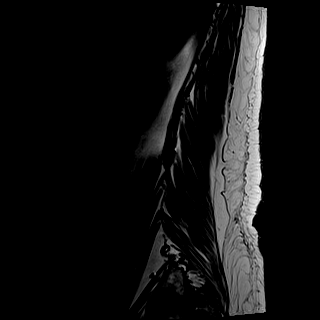

[Series 11: STIR · sagittal · 4.0mm · 0.41mm/px · 2 of 17 slices shown]
[im 1/17]
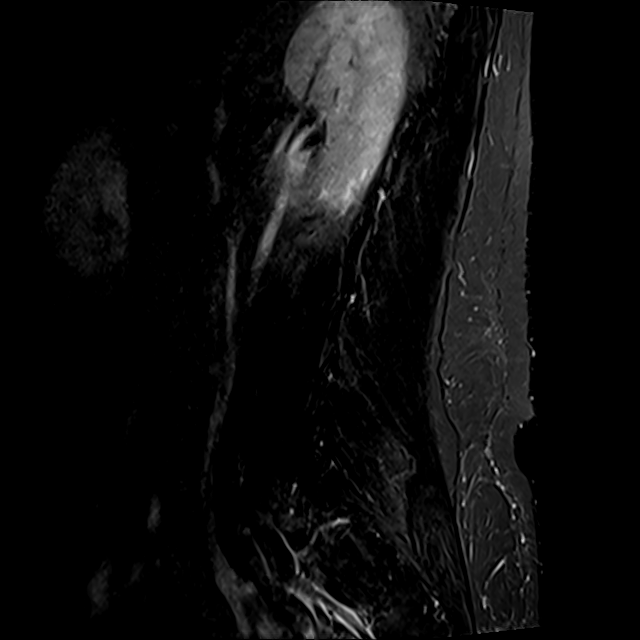
[im 3/17]
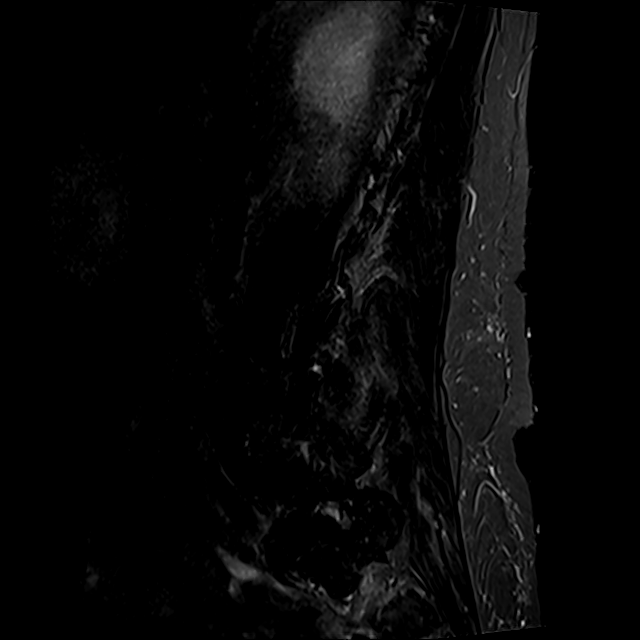

[Series 12: T2 · axial · 4.0mm · 0.78mm/px · z∈[-250,-32]mm · 8 of 37 slices shown (2 of 2)]
[im 1/37]
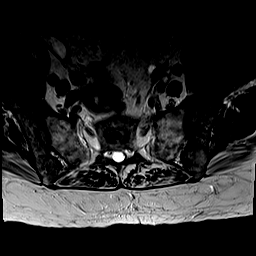
[im 6/37]
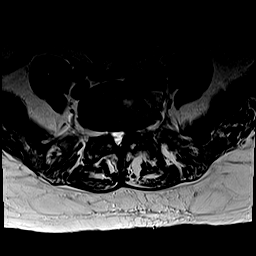
[im 12/37]
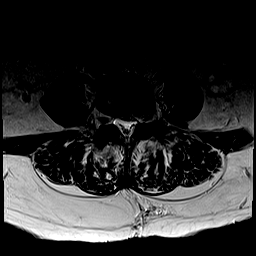
[im 17/37]
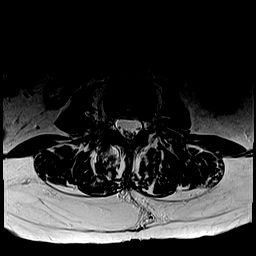
[im 20/37]
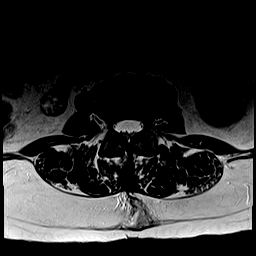
[im 25/37]
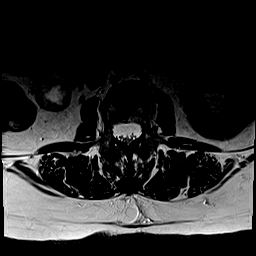
[im 31/37]
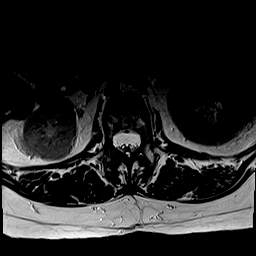
[im 37/37]
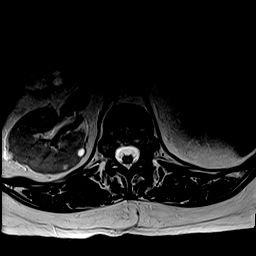

[Series 13: T1 · axial · 4.0mm · 0.39mm/px · z∈[-250,-32]mm · 8 of 37 slices shown (2 of 2)]
[im 1/37]
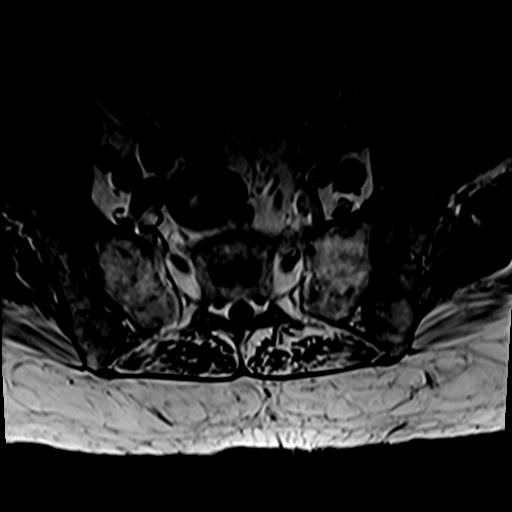
[im 6/37]
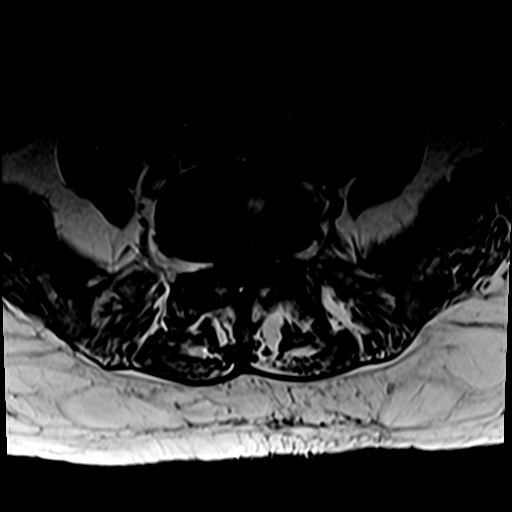
[im 12/37]
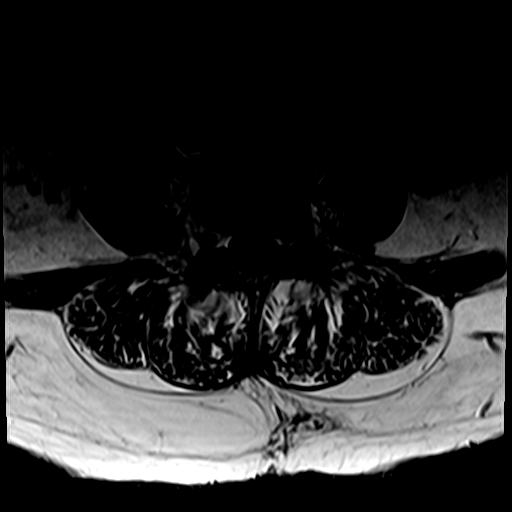
[im 17/37]
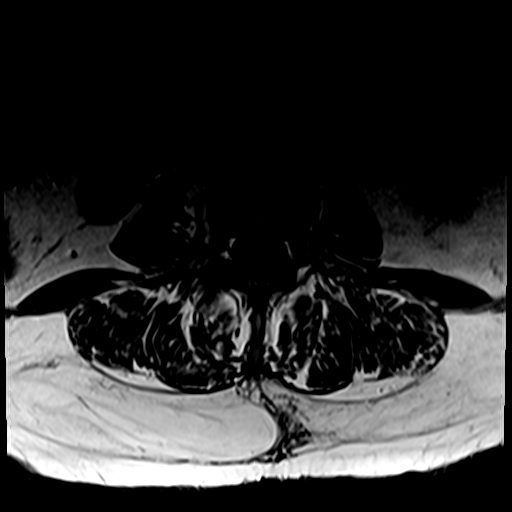
[im 20/37]
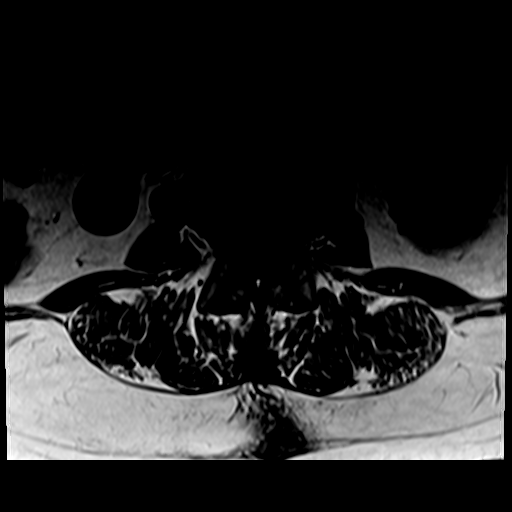
[im 25/37]
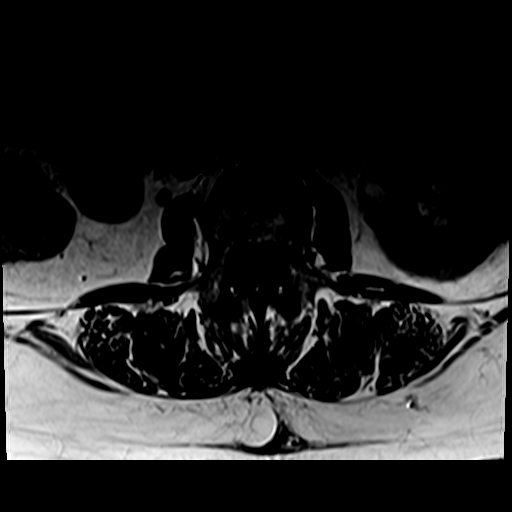
[im 31/37]
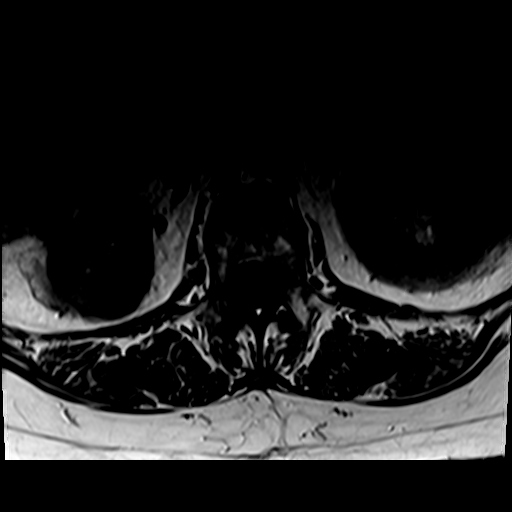
[im 37/37]
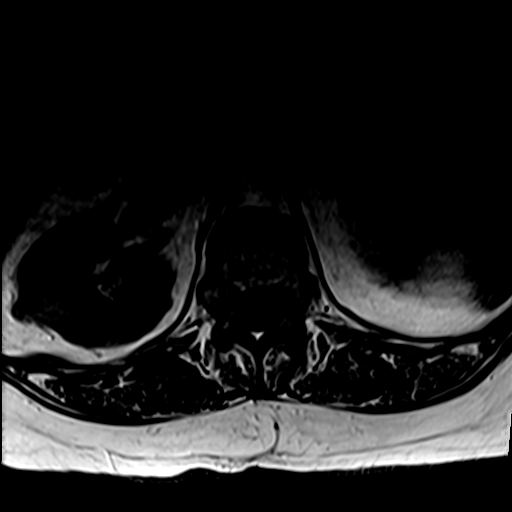

[31 of 48 positions shown; findings below may reference images not displayed]

FINDINGS: Segmentation: Assumed standard. The last well-formed disc space is
designated L5-S1 for the purposes of this report.

Alignment:  Trace anterolisthesis at L4-L5.

Vertebrae:  No fracture, evidence of discitis, or bone lesion.

Conus medullaris and cauda equina: Conus extends to the L1-L2 level.
Conus and cauda equina appear normal.

Paraspinal and other soft tissues: Negative.

Disc levels:

T12-L1 to L2-L3: No significant disc bulge or herniation. No
stenosis.

L3-L4: Mild disc bulging with superimposed right foraminal far
lateral disc osteophyte complex. Bulging disc encroaches on both
exiting L3 nerve roots. Mild right greater than left facet
arthropathy. Mild spinal canal and right lateral recess stenosis. No
neuroforaminal stenosis.

L4-L5: Mild disc bulging with superimposed left paracentral disc
protrusion and annular fissure. Severe bilateral facet arthropathy.
Severe spinal canal and left greater than right lateral recess
stenosis. No neuroforaminal stenosis.

L5-S1: Circumferential disc osteophyte complex eccentric to the
left. Severe bilateral facet arthropathy. Mild spinal canal
stenosis. Severe left and moderate right lateral recess stenosis. No
neuroforaminal stenosis.
IMPRESSION: 1. Multilevel degenerative changes of the lumbar spine as described
above. Severe spinal canal and left greater than right lateral
recess stenosis at L4-L5 which could affect either descending L5
nerve root.
2. Severe left and moderate right lateral recess stenosis at L5-S1,
which could affect either descending S1 nerve root.

## 2021-03-23 IMAGING — MR MR CERVICAL SPINE W/O CM
5 series · 39 of 48 positions shown · non-contrast
Comparison: None.

CLINICAL DATA: Chronic neck pain radiating to the left shoulder.

EXAM:
MRI CERVICAL SPINE WITHOUT CONTRAST
TECHNIQUE: Multiplanar, multisequence MR imaging of the cervical spine was
performed. No intravenous contrast was administered.

[Series 5: T2 · sagittal · 3.0mm · 0.62mm/px · 6 of 15 slices shown (1 of 2)]
[im 1/15]
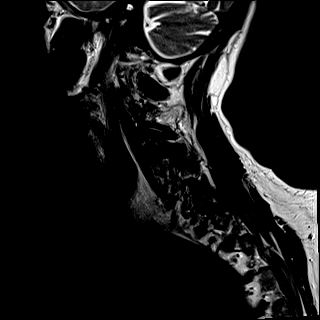
[im 3/15]
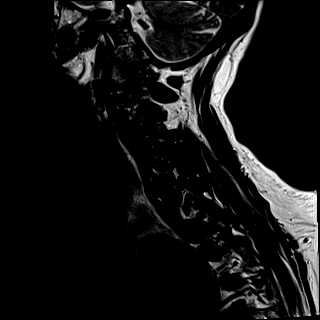
[im 6/15]
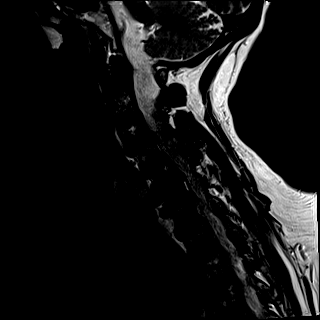
[im 9/15]
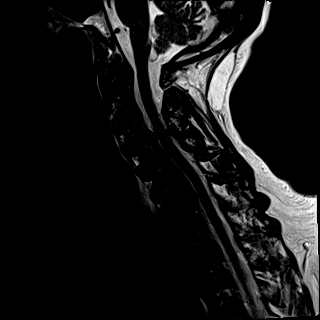
[im 12/15]
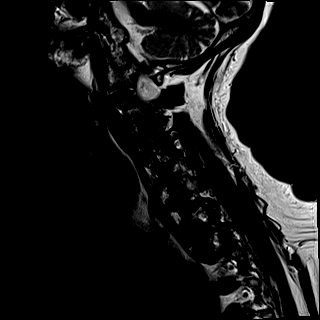
[im 15/15]
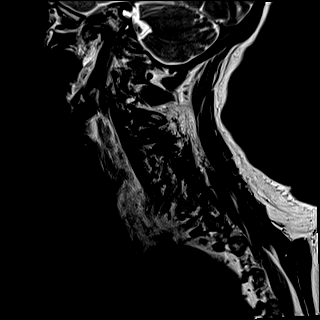

[Series 6: FLAIR · sagittal · 3.0mm · 0.78mm/px · 7 of 15 slices shown]
[im 1/15]
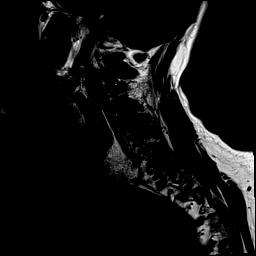
[im 3/15]
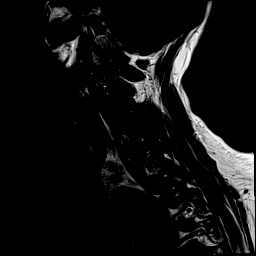
[im 5/15]
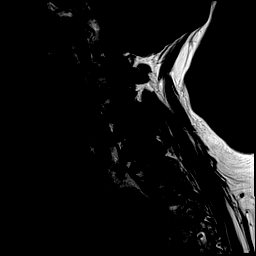
[im 8/15]
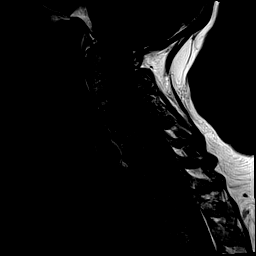
[im 10/15]
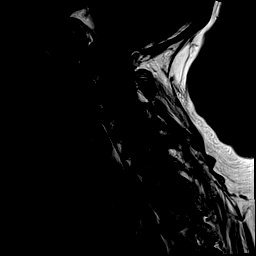
[im 12/15]
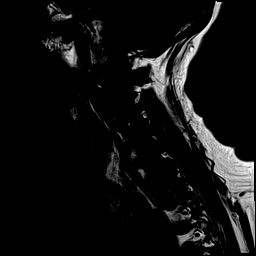
[im 15/15]
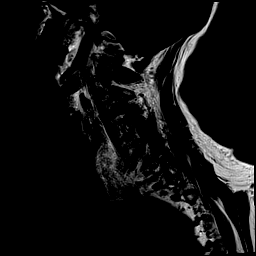

[Series 7: STIR · sagittal · 3.0mm · 0.62mm/px · 7 of 15 slices shown]
[im 1/15]
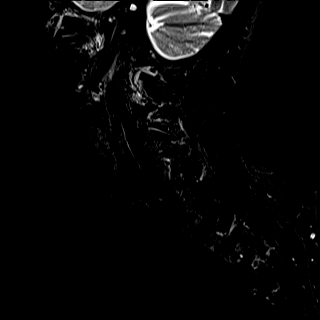
[im 3/15]
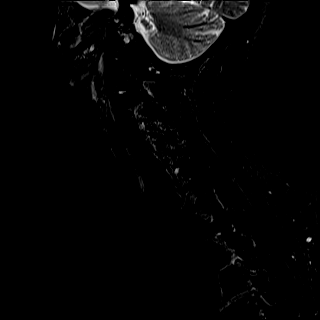
[im 5/15]
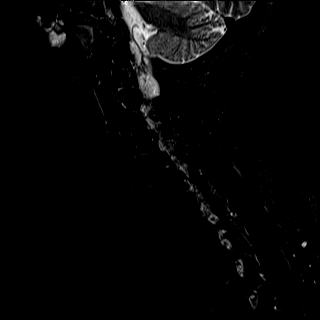
[im 8/15]
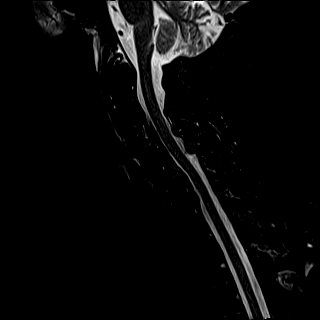
[im 10/15]
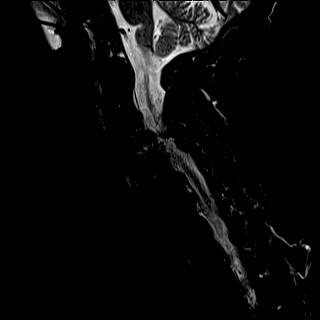
[im 12/15]
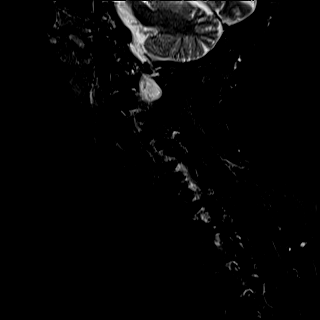
[im 15/15]
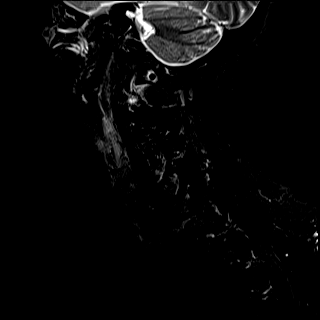

[Series 8: T2 · axial · 3.0mm · 0.70mm/px · z∈[-149,-57]mm · 11 of 29 slices shown (2 of 2)]
[im 1/29]
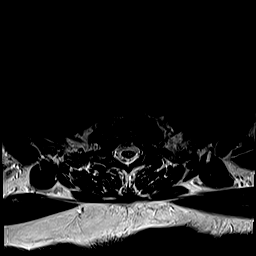
[im 3/29]
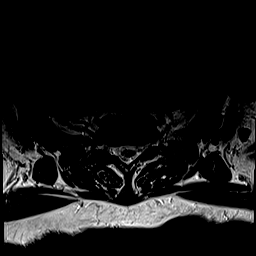
[im 5/29]
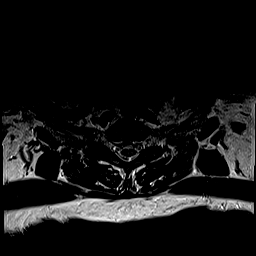
[im 7/29]
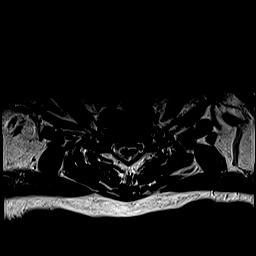
[im 9/29]
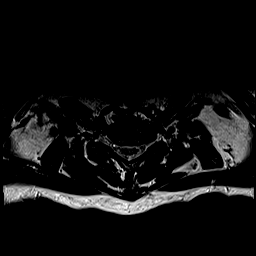
[im 11/29]
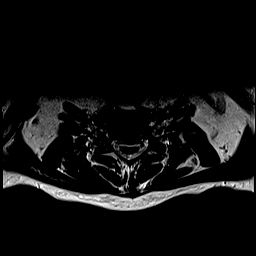
[im 13/29]
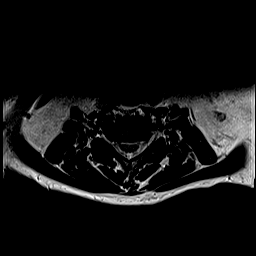
[im 16/29]
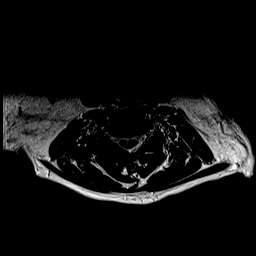
[im 20/29]
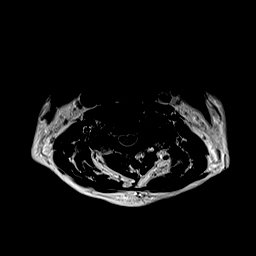
[im 24/29]
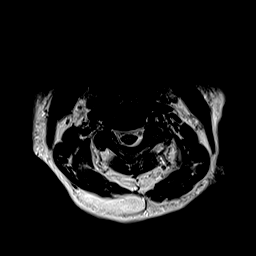
[im 29/29]
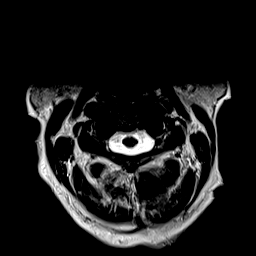

[Series 9: ax mpgr · axial · 3.0mm · 0.35mm/px · z∈[-149,-57]mm · 8 of 29 slices shown]
[im 1/29]
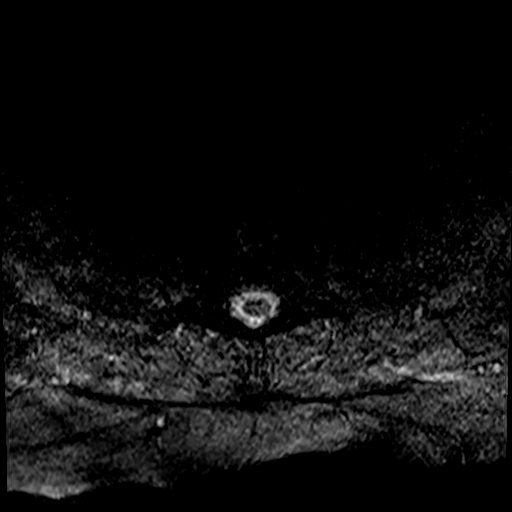
[im 5/29]
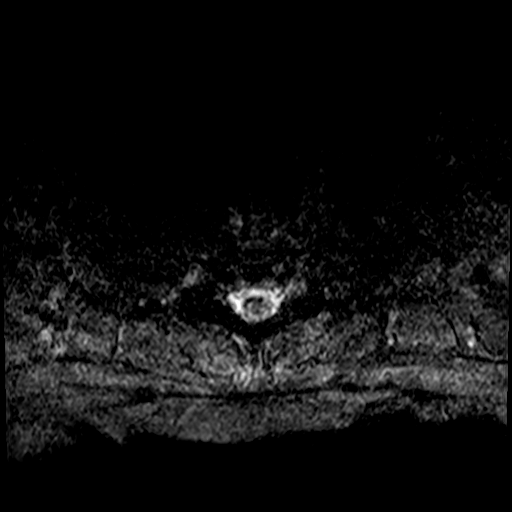
[im 9/29]
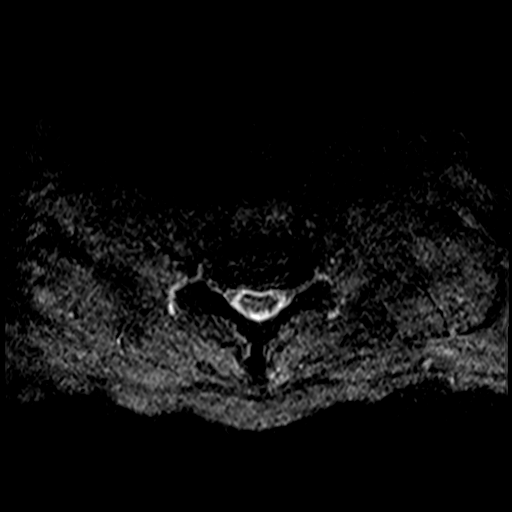
[im 13/29]
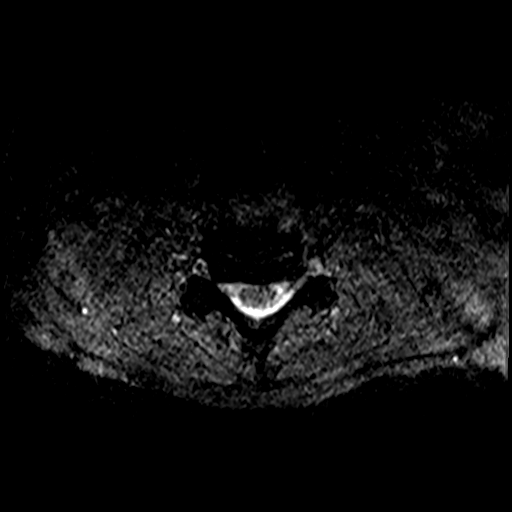
[im 16/29]
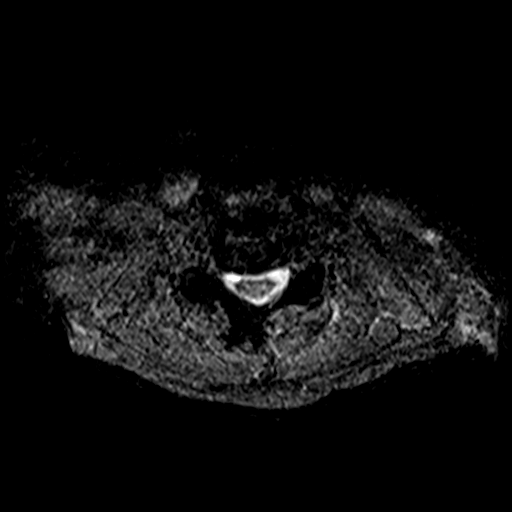
[im 20/29]
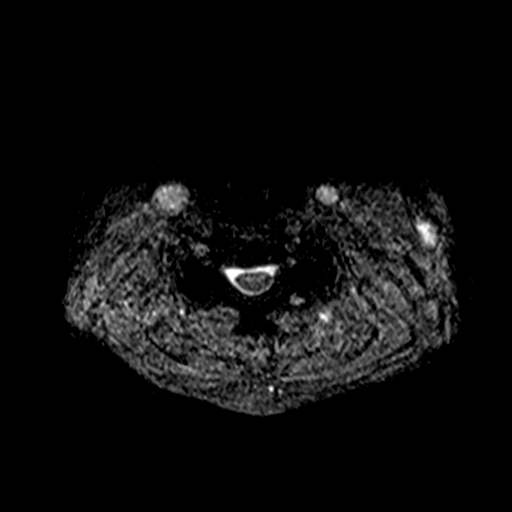
[im 24/29]
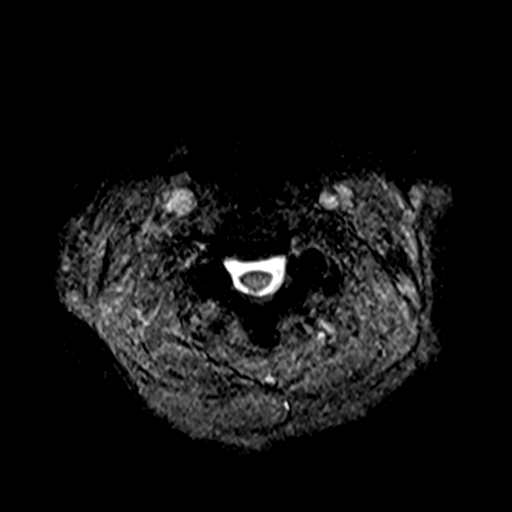
[im 29/29]
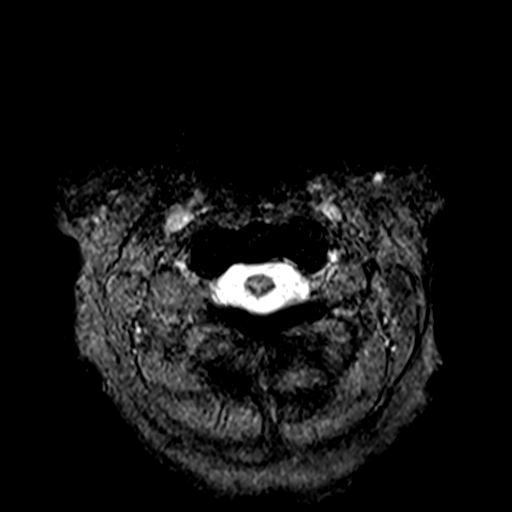

[39 of 48 positions shown; findings below may reference images not displayed]

FINDINGS: Alignment: 2 mm anterolisthesis at C3-C4 and C4-C5.

Vertebrae: No fracture, evidence of discitis, or bone lesion.
Left-sided degenerative endplate marrow edema at C3-C4.

Cord: Normal signal and morphology.

Posterior Fossa, vertebral arteries, paraspinal tissues: Negative.

Disc levels:

C2-C3: Mild disc bulging and bilateral uncovertebral hypertrophy.
Severe left and moderate right facet arthropathy. Moderate left
greater than right neuroforaminal stenosis. No spinal canal
stenosis.

C3-C4: Moderate disc bulging with superimposed small central disc
protrusion. Severe bilateral facet arthropathy with perifacet marrow
edema on the right. Mild spinal canal stenosis. Severe bilateral
neuroforaminal stenosis.

C4-C5: Mild disc bulging moderate right and mild left facet
arthropathy. Mild right neuroforaminal stenosis. No spinal canal or
left neuroforaminal stenosis.

C5-C6: Small posterior disc osteophyte complex and mild bilateral
uncovertebral hypertrophy. Mild right greater than left
neuroforaminal stenosis. No spinal canal stenosis.

C6-C7: Mild disc bulging and bilateral uncovertebral hypertrophy. No
stenosis.

C7-T1: Negative disc. Moderate right facet arthropathy. No stenosis.
IMPRESSION: 1. Multilevel degenerative changes of the cervical spine as
described above, worst at C3-C4 where there is severe bilateral
neuroforaminal stenosis.
2. Acute degenerative inflammatory changes of the right C3-C4 facet
joint, which can be a source of pain.

## 2021-03-24 DIAGNOSIS — M25511 Pain in right shoulder: Secondary | ICD-10-CM | POA: Diagnosis not present

## 2021-03-24 DIAGNOSIS — G8929 Other chronic pain: Secondary | ICD-10-CM | POA: Diagnosis not present

## 2021-03-25 NOTE — Progress Notes (Signed)
Pts daughter made aware of options and going to discuss w pt.

## 2021-03-30 DIAGNOSIS — M12811 Other specific arthropathies, not elsewhere classified, right shoulder: Secondary | ICD-10-CM | POA: Diagnosis not present

## 2021-03-30 DIAGNOSIS — S42151D Displaced fracture of neck of scapula, right shoulder, subsequent encounter for fracture with routine healing: Secondary | ICD-10-CM | POA: Diagnosis not present

## 2021-03-30 DIAGNOSIS — Z96611 Presence of right artificial shoulder joint: Secondary | ICD-10-CM | POA: Diagnosis not present

## 2021-04-06 DIAGNOSIS — R413 Other amnesia: Secondary | ICD-10-CM | POA: Diagnosis not present

## 2021-04-09 ENCOUNTER — Other Ambulatory Visit: Payer: Self-pay

## 2021-04-09 ENCOUNTER — Ambulatory Visit
Admission: RE | Admit: 2021-04-09 | Discharge: 2021-04-09 | Disposition: A | Discharge status: Home / Self Care | Payer: Medicare Other | Source: Ambulatory Visit | Attending: Certified Nurse Midwife | Admitting: Certified Nurse Midwife

## 2021-04-09 DIAGNOSIS — Z1231 Encounter for screening mammogram for malignant neoplasm of breast: Secondary | ICD-10-CM | POA: Diagnosis not present

## 2021-04-09 IMAGING — MG MM DIGITAL SCREENING BILAT W/ TOMO AND CAD
8 series · 8 of 24 positions shown · non-contrast
Comparison: Previous exam(s).

CLINICAL DATA: Screening.

EXAM:
DIGITAL SCREENING BILATERAL MAMMOGRAM WITH TOMOSYNTHESIS AND CAD
TECHNIQUE: Bilateral screening digital craniocaudal and mediolateral oblique
mammograms were obtained. Bilateral screening digital breast
tomosynthesis was performed. The images were evaluated with
computer-aided detection.

[L CC synth-2D]
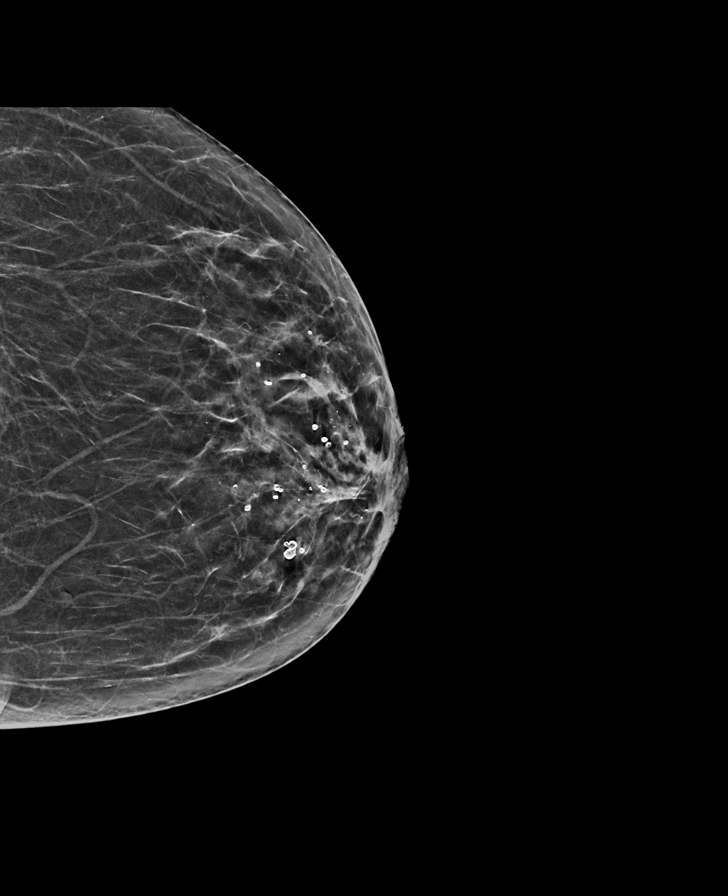

[R CC synth-2D]
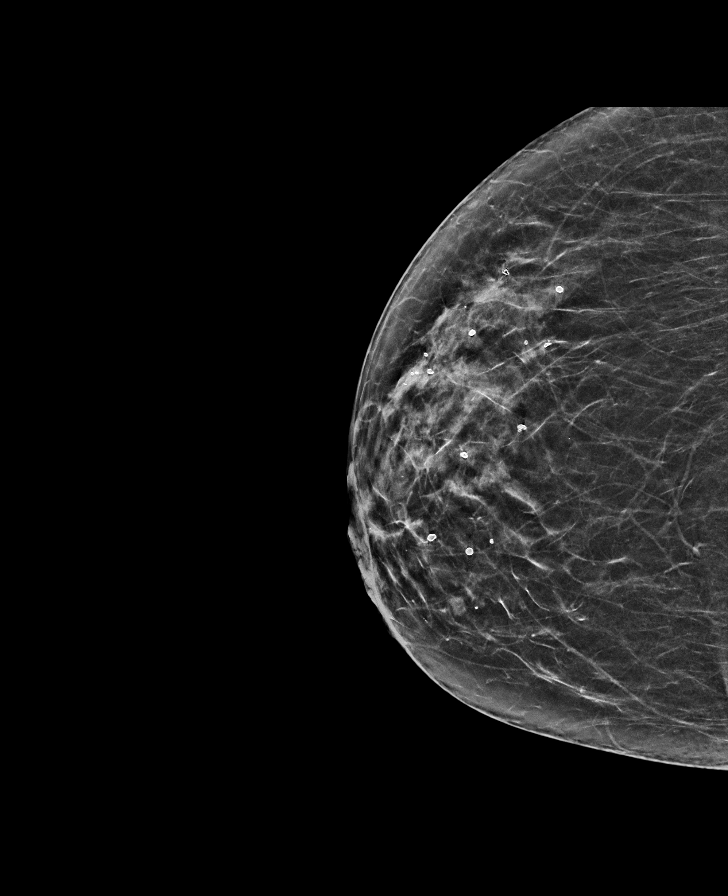

[L MLO synth-2D]
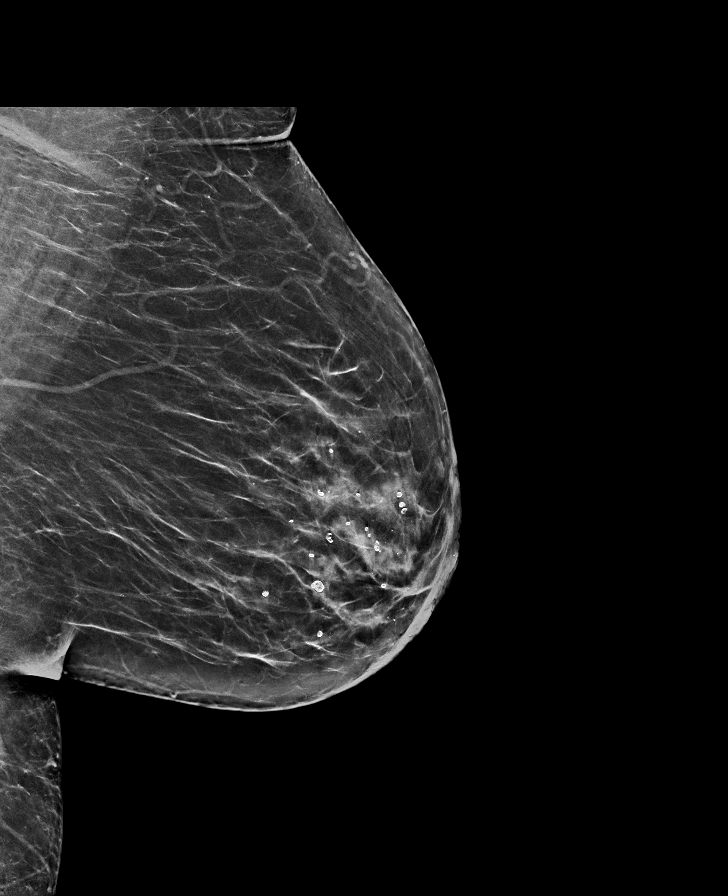

[R MLO synth-2D]
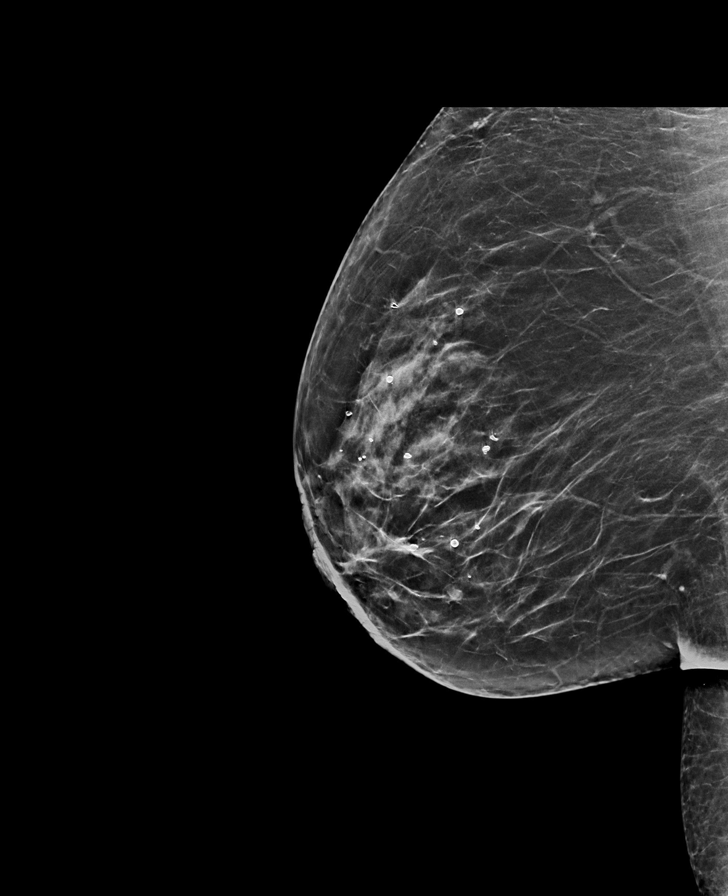

[L MLO tomo · tomo slice 34/67.0]
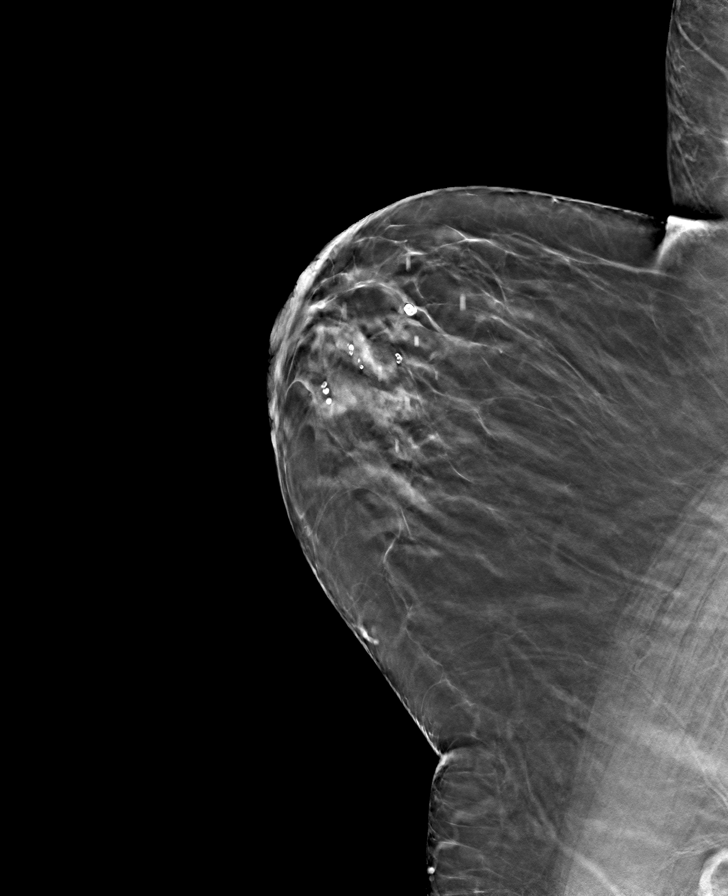

[R CC tomo · tomo slice 28/55.0]
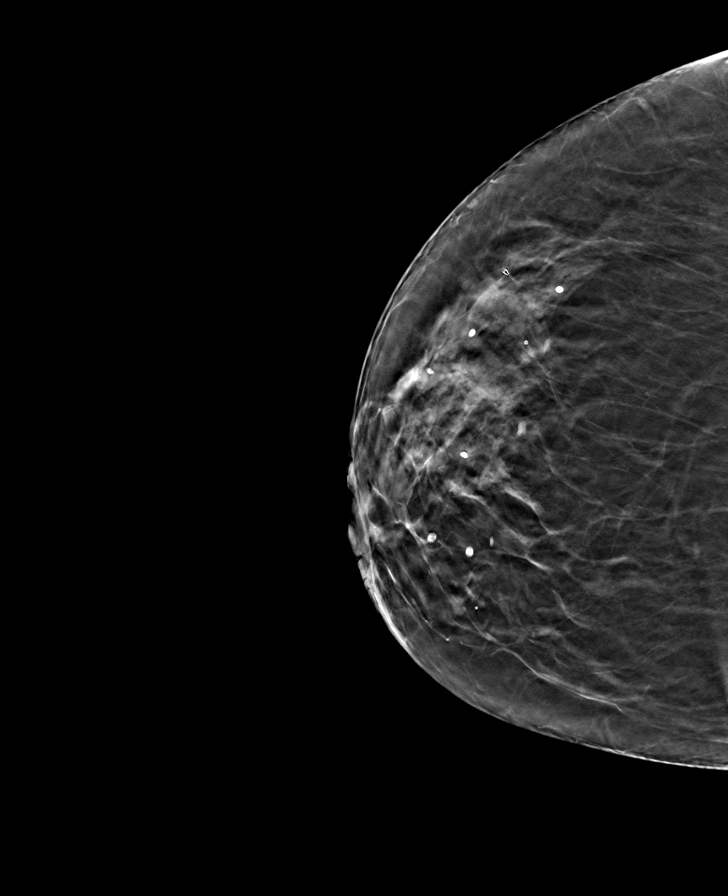

[L CC tomo · tomo slice 29/57.0]
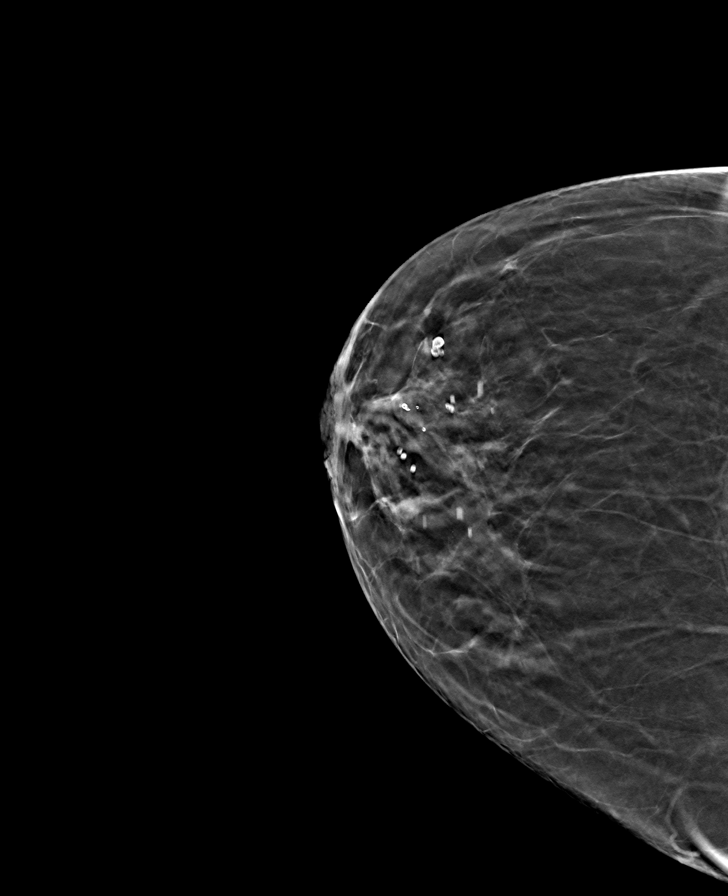

[R MLO tomo · tomo slice 34/67.0]
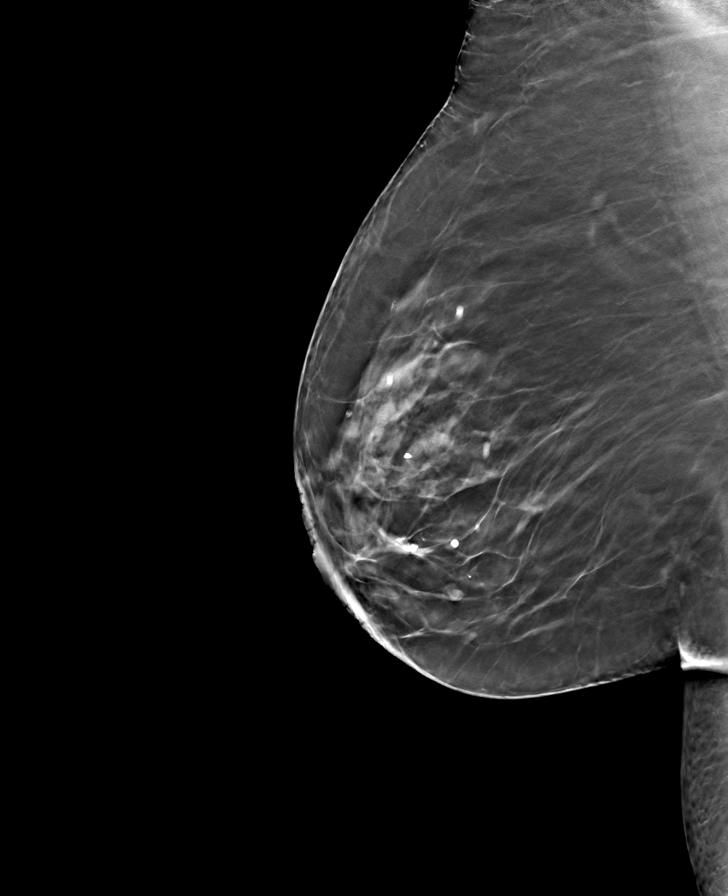

[8 of 24 positions shown; findings below may reference images not displayed]

ACR Breast Density Category c: The breast tissue is heterogeneously
dense, which may obscure small masses.
FINDINGS: There are no findings suspicious for malignancy.
IMPRESSION: No mammographic evidence of malignancy. A result letter of this
screening mammogram will be mailed directly to the patient.

RECOMMENDATION:
Screening mammogram in one year. (Code:[V2])

BI-RADS CATEGORY  1: Negative.

## 2021-04-10 ENCOUNTER — Encounter: Payer: Self-pay | Admitting: Obstetrics and Gynecology

## 2021-04-10 ENCOUNTER — Ambulatory Visit (INDEPENDENT_AMBULATORY_CARE_PROVIDER_SITE_OTHER): Payer: Medicare Other | Admitting: Obstetrics and Gynecology

## 2021-04-10 VITALS — BP 123/73 | HR 68 | Ht 62.0 in | Wt 191.9 lb

## 2021-04-10 DIAGNOSIS — N3946 Mixed incontinence: Secondary | ICD-10-CM | POA: Diagnosis not present

## 2021-04-10 DIAGNOSIS — N8110 Cystocele, unspecified: Secondary | ICD-10-CM | POA: Insufficient documentation

## 2021-04-10 DIAGNOSIS — R9389 Abnormal findings on diagnostic imaging of other specified body structures: Secondary | ICD-10-CM | POA: Diagnosis not present

## 2021-04-10 DIAGNOSIS — N816 Rectocele: Secondary | ICD-10-CM | POA: Insufficient documentation

## 2021-04-10 DIAGNOSIS — N819 Female genital prolapse, unspecified: Secondary | ICD-10-CM

## 2021-04-10 DIAGNOSIS — D250 Submucous leiomyoma of uterus: Secondary | ICD-10-CM

## 2021-04-10 MED ORDER — AMOXICILLIN 500 MG PO CAPS: 500.0000 mg | ORAL_CAPSULE | Freq: Three times a day (TID) | ORAL | 2 refills | Status: DC

## 2021-04-10 NOTE — Patient Instructions (Signed)
How to Use a Vaginal Pessary A vaginal pessary is a removable device that is placed into your vagina to support pelvic organs that droop. These organs include your uterus, bladder, and rectum. When your pelvic organs drop down into your vagina, it causes a condition called pelvic organ prolapse (POP). A pessary may be an alternative to surgery for women with POP. It may help women who leak urine when they strain or exercise (stress incontinence). This is a symptom of POP. A vaginal pessary may also be a temporary treatment for stress incontinence during pregnancy. There are several types of pessaries. All types are usually made of silicone. You can insert and remove some on your own. Other types must be inserted and removed by your health care provider at office visits. The reason you are using a pessary and the severity of your condition will determine which one is best for you. It is also important to find the right size. A pessary that is too small may fall out. A pessary that is too large may cause pain or discomfort. Your health care provider will do a physical exam to find the correct size and fit for your pessary. It may take several appointments to find the best fit for you. If you can be fit with the type of pessary that you can insert, remove, and clean yourself, your health care provider will teach you how to use your pessary at home. You may have checkups every few months. If you have the type of pessary that needs to be inserted and removed by your health care provider, you will have appointments every few months to have the pessary removed, cleaned, and replaced. What are the risks? When properly fitted and cared for, risks of using a vaginal pessary can be small. However, there can be problems that may include: Vaginal discharge. Vaginal bleeding. A bad smell coming from your vagina. Scraping of the skin inside your vagina. How to use your pessary Follow your health care provider's  instructions for using a pessary. These instructions may vary, depending on the type of pessary you have. To insert a pessary: Wash your hands with soap and water for at least 20 seconds. Squeeze or fold the pessary in half and lubricate the tip with a water-based lubricant. Insert the pessary into your vagina. It will unfold and provide support. To remove the pessary, gently tug it out of your vagina. You can remove the pessary every night or after several days. You can also remove it to have sex. How to care for your pessary If you have a pessary that you can remove: Clean your pessary with soap and water. Rinse well. Dry it completely before inserting it back into your vagina. Follow these instructions at home: Take over-the-counter and prescription medicines only as told by your health care provider. Your health care provider may prescribe an estrogen cream to moisten your vagina. Keep all follow-up visits. This is important. Contact a health care provider if: You feel any pain or discomfort when your pessary is in place. You continue to have stress incontinence. You have trouble keeping your pessary from falling out. You have an unusual vaginal discharge that is blood-tinged or smells bad. Summary A vaginal pessary is a removable device that is placed into your vagina to support pelvic organs that droop. This condition is called pelvic organ prolapse (POP). There are several types of pessaries. Some you can insert and remove on your own. Others must be inserted and removed  by your health care provider. The best type for you depends on the reason you are using a pessary and the severity of your condition. It is also important to find the right size. If you can use the type that you insert and remove on your own, your health care provider will teach you how to use it and schedule checkups every few months. If you have the type that needs to be inserted and removed by your health care  provider, you will have regular appointments to have your pessary removed, cleaned, and replaced. This information is not intended to replace advice given to you by your health care provider. Make sure you discuss any questions you have with your health care provider. Document Revised: 10/25/2019 Document Reviewed: 10/25/2019 Elsevier Patient Education  [place].

## 2021-04-10 NOTE — Progress Notes (Signed)
GYNECOLOGY PROGRESS NOTE  Subjective:    Patient ID: Angela Fernandez, female    DOB: April 03, 1946, 75 y.o.   MRN: [id number]  HPI  Patient is a 75 y.o. [id number] female who presents for referral from Philip Aspen, CNM for evaluation for pessary use. She is accompanied by her daughter today. She reports issues of stress incontinence and pelvic pressure. Also noting some urgency, and recurrent UTI's.  Additionally has some irritative voiding symptoms even thought she was having UTI's but tests would. Notes that she has lost weight recently (dietary changes), has diabetes.  Her symptoms have improved somewhat with weight loss. Reports remote h/o bladder surgery (possible sling ~ 15 years ago).     The following portions of the patient's history were reviewed and updated as appropriate:  She  has a past medical history of Anemia, Bipolar affect, depressed ([place]), COPD (chronic obstructive pulmonary disease) ([place]), Diabetes mellitus without complication ([place]), GERD (gastroesophageal reflux disease), Hyperlipidemia, Hypertension, Hypothyroidism, PTSD (post-traumatic stress disorder), Schizoaffective disorder ([place]), TIA (transient ischemic attack) (2010), and UTI (urinary tract infection).  She  has a past surgical history that includes Shoulder surgery (Right, 2020); Cholecystectomy; Breast surgery (Right); Joint replacement; Replacement total knee bilateral (Bilateral); Eye surgery; Cataract extraction w/ intraocular lens  implant, bilateral (Bilateral); Bilateral carpal tunnel release (Bilateral); Colonoscopy; Esophagogastroduodenoscopy; Nasal sinus surgery; Reverse shoulder arthroplasty (Right, 09/02/2020); and Breast biopsy.  Her family history includes Alzheimer's disease in her maternal grandmother and mother; Pancreatitis in her father.  She  reports that she quit smoking about 4 years ago. Her smoking use included cigarettes. She has never used smokeless tobacco. She reports that she does not  currently use alcohol. She reports that she does not currently use drugs after having used the following drugs: Cocaine, Heroin, Marijuana, and LSD.  Current Outpatient Medications on File Prior to Visit  Medication Sig Dispense Refill   albuterol (VENTOLIN HFA) 108 (90 Base) MCG/ACT inhaler Inhale 2 puffs into the lungs every 6 (six) hours as needed for wheezing or shortness of breath. 8 g 2   buPROPion (WELLBUTRIN SR) 200 MG 12 hr tablet Take 1 tablet (200 mg total) by mouth 2 (two) times daily. Take 1 bid     clonazePAM (KLONOPIN) 1 MG tablet Take 1.5 mg by mouth daily. Take 1.5 tabs in the am, 1 tabs at noon, and 1 tab at bedtime/ psych     cyclobenzaprine (FLEXERIL) 10 MG tablet Take 1 tablet (10 mg total) by mouth at bedtime. 30 tablet 0   desvenlafaxine (PRISTIQ) 50 MG 24 hr tablet Take 50 mg by mouth at bedtime.     docusate sodium (COLACE) 100 MG capsule Take 200 mg by mouth 2 (two) times daily.     fenofibrate (TRICOR) 145 MG tablet Take 1 tablet (145 mg total) by mouth daily. 4pm 90 tablet 1   ferrous sulfate 325 (65 FE) MG tablet Take 1 tablet (325 mg total) by mouth daily with breakfast. 90 tablet 0   Glucagon, rDNA, (GLUCAGON EMERGENCY) 1 MG KIT Inject 1 Device as directed as needed (hypoglycemia). 1 kit 0   insulin glargine (LANTUS SOLOSTAR) 100 UNIT/ML Solostar Pen Inject 30 Units into the skin daily. 30 units daily into skin 15 mL 1   lactulose (CHRONULAC) 10 GM/15ML solution Take 10 g by mouth daily as needed.     levothyroxine (SYNTHROID) 175 MCG tablet Take 1 tablet (175 mcg total) by mouth daily before breakfast. 90 tablet 1   lisinopril (ZESTRIL) 5  MG tablet Take 1 tablet (5 mg total) by mouth daily. 90 tablet 1   Lurasidone HCl (LATUDA) 120 MG TABS Take 1 tablet by mouth daily. Psych Takes at 4 pm     meloxicam (MOBIC) 7.5 MG tablet Take 1 tablet (7.5 mg total) by mouth daily. 90 tablet 0   metoprolol succinate (TOPROL-XL) 100 MG 24 hr tablet Take 1 tablet (100 mg total) by  mouth daily. Take with or immediately following a meal. 90 tablet 1   Multiple Vitamins-Minerals (HAIR SKIN AND NAILS FORMULA PO) Take 1 tablet by mouth at bedtime.     Multiple Vitamins-Minerals (PRESERVISION AREDS PO) Take 1 capsule by mouth in the morning and at bedtime.     omeprazole (PRILOSEC) 40 MG capsule Take 1 capsule (40 mg total) by mouth daily. 90 capsule 0   OVER THE COUNTER MEDICATION Take 1 Scoop by mouth daily in the afternoon. Collagen protein powder     Polyethyl Glycol-Propyl Glycol (SYSTANE OP) Apply 2 drops to eye 3 (three) times daily as needed.     simvastatin (ZOCOR) 20 MG tablet Take 1 tablet (20 mg total) by mouth daily. 90 tablet 1   traZODone (DESYREL) 50 MG tablet Take 50 mg by mouth at bedtime. Take one tablet at bedtime/ psych     TRULICITY 1.19 ER/7.4YC SOPN INJECT 1 SYRINGEFUL SUBCUTANEOUSLY ONCE A WEEK 4 mL 0   zolpidem (AMBIEN) 5 MG tablet Take 5 mg by mouth at bedtime.     acetaminophen (TYLENOL) 325 MG tablet Take 650 mg by mouth every 4 (four) hours as needed. (Patient not taking: Reported on 04/10/2021)     traMADol (ULTRAM) 50 MG tablet Take 1 tablet (50 mg total) by mouth every 6 (six) hours as needed for moderate pain. (Patient not taking: Reported on 04/10/2021) 30 tablet 0   umeclidinium-vilanterol (ANORO ELLIPTA) 62.5-25 MCG/INH AEPB Inhale 1 puff into the lungs daily. (Patient not taking: Reported on 04/10/2021)     No current facility-administered medications on file prior to visit.   She has No Known Allergies..  Review of Systems Pertinent items noted in HPI and remainder of comprehensive ROS otherwise negative.   Objective:   Blood pressure 123/73, pulse 68, height _0  (1.575 m), weight 191 lb 14.4 oz (87 kg). Body mass index is 35.1 kg/m. General appearance: alert and no distress Abdomen: soft, non-tender; bowel sounds normal; no masses,  no organomegaly Pelvic exam:    -  VULVA: normal appearing vulva with no masses, tenderness or  lesions,    - VAGINA: atrophic, PELVIC FLOOR EXAM: rectocele Grade 2-3, cystocele Grade 2, uterine descensus grade Grade 1-2,    - CERVIX: normal appearing cervix without discharge or lesions   - UTERUS: uterus is normal size, shape, consistency and non-tender   - ADNEXA: normal adnexa in size, nontender and no masses. Extremities: extremities normal, atraumatic, no cyanosis or edema Neurologic: Grossly normal    Imaging:  MM 3D SCREEN BREAST BILATERAL CLINICAL DATA:  Screening.  EXAM: DIGITAL SCREENING BILATERAL MAMMOGRAM WITH TOMOSYNTHESIS AND CAD  TECHNIQUE: Bilateral screening digital craniocaudal and mediolateral oblique mammograms were obtained. Bilateral screening digital breast tomosynthesis was performed. The images were evaluated with computer-aided detection.  COMPARISON:  Previous exam(s).  ACR Breast Density Category c: The breast tissue is heterogeneously dense, which may obscure small masses.  FINDINGS: There are no findings suspicious for malignancy.  IMPRESSION: No mammographic evidence of malignancy. A result letter of this screening mammogram will be mailed directly  to the patient.  RECOMMENDATION: Screening mammogram in one year. (Code:[id number]-B-01Y)  BI-RADS CATEGORY  1: Negative.  Electronically Signed   By: Ammie Ferrier M.D.   On: 04/10/2021 06:49  Assessment:   1. Cystocele with rectocele   2. Mixed urinary incontinence due to female genital prolapse   3. Submucous leiomyoma of uterus   4. Thickened endometrium      Plan:   Pelvic organ prolapse - Discussed options for management with patient, including  Continuing dietary modifications for weight loss and diabetes management, elimination of bladder irritants, and pelvic exercises (Kegels). Patient chooses to do this.  Pessary use Surgical intervention (although patient with several comorbidities).  Can also consider medication for urge incontinence. Patient will hold off at this time.   Submucous leiomyoma - no active issues at this time Thickened endometrium - possibly secondary to submucosal fibroid but unclear.  Patient without any episodes of bleeding. Otherwise stable.  Will continue to monitor.    Call or return to clinic prn if these symptoms worsen or fail to improve as anticipated.   A total of 25 minutes were spent face-to-face with the patient during this encounter and over half of that time involved counseling and coordination of care.   Rubie Maid, MD [facility]

## 2021-04-12 ENCOUNTER — Other Ambulatory Visit: Payer: Self-pay | Admitting: Family Medicine

## 2021-04-12 DIAGNOSIS — E1159 Type 2 diabetes mellitus with other circulatory complications: Secondary | ICD-10-CM

## 2021-04-16 DIAGNOSIS — G8929 Other chronic pain: Secondary | ICD-10-CM | POA: Diagnosis not present

## 2021-04-16 DIAGNOSIS — M542 Cervicalgia: Secondary | ICD-10-CM | POA: Diagnosis not present

## 2021-04-16 DIAGNOSIS — M25511 Pain in right shoulder: Secondary | ICD-10-CM | POA: Diagnosis not present

## 2021-04-19 ENCOUNTER — Other Ambulatory Visit: Payer: Self-pay | Admitting: Family Medicine

## 2021-04-19 DIAGNOSIS — K219 Gastro-esophageal reflux disease without esophagitis: Secondary | ICD-10-CM

## 2021-04-19 MED ORDER — OMEPRAZOLE 40 MG PO CPDR
40.0000 mg | DELAYED_RELEASE_CAPSULE | Freq: Every day | ORAL | 0 refills | Status: DC
Start: 2021-04-19 — End: 2021-04-28

## 2021-04-19 NOTE — Telephone Encounter (Signed)
Requested Prescriptions  Pending Prescriptions Disp Refills  . omeprazole (PRILOSEC) 40 MG capsule [Pharmacy Med Name: Omeprazole 40 MG Oral Capsule Delayed Release] 90 capsule 0    Sig: Take 1 capsule by mouth once daily     Gastroenterology: Proton Pump Inhibitors Passed - 04/19/2021 12:36 PM      Passed - Valid encounter within last 12 months    Recent Outpatient Visits          5 months ago Primary hypertension   [facility] Juline Patch, MD   7 months ago Type 2 diabetes mellitus with other circulatory complication, with long-term current use of insulin ([place])   [facility] Juline Patch, MD   1 year ago Type 2 diabetes mellitus with other circulatory complication, with long-term current use of insulin ([place])   [facility] Juline Patch, MD   1 year ago Establishing care with new doctor, encounter for   [place], MD      Future Appointments            In 1 week Juline Patch, MD [facility], [facility]

## 2021-04-28 ENCOUNTER — Other Ambulatory Visit: Payer: Self-pay

## 2021-04-28 ENCOUNTER — Ambulatory Visit (INDEPENDENT_AMBULATORY_CARE_PROVIDER_SITE_OTHER): Payer: Medicare Other | Admitting: Family Medicine

## 2021-04-28 ENCOUNTER — Encounter: Payer: Self-pay | Admitting: Family Medicine

## 2021-04-28 ENCOUNTER — Ambulatory Visit: Payer: Medicare Other | Admitting: Family Medicine

## 2021-04-28 VITALS — BP 130/82 | HR 76 | Ht 62.0 in | Wt 189.0 lb

## 2021-04-28 DIAGNOSIS — D509 Iron deficiency anemia, unspecified: Secondary | ICD-10-CM

## 2021-04-28 DIAGNOSIS — Z23 Encounter for immunization: Secondary | ICD-10-CM | POA: Diagnosis not present

## 2021-04-28 DIAGNOSIS — E034 Atrophy of thyroid (acquired): Secondary | ICD-10-CM | POA: Diagnosis not present

## 2021-04-28 DIAGNOSIS — I10 Essential (primary) hypertension: Secondary | ICD-10-CM

## 2021-04-28 DIAGNOSIS — J449 Chronic obstructive pulmonary disease, unspecified: Secondary | ICD-10-CM

## 2021-04-28 DIAGNOSIS — K219 Gastro-esophageal reflux disease without esophagitis: Secondary | ICD-10-CM | POA: Diagnosis not present

## 2021-04-28 DIAGNOSIS — E782 Mixed hyperlipidemia: Secondary | ICD-10-CM | POA: Diagnosis not present

## 2021-04-28 MED ORDER — ALBUTEROL SULFATE HFA 108 (90 BASE) MCG/ACT IN AERS: 2.0000 | INHALATION_SPRAY | Freq: Four times a day (QID) | RESPIRATORY_TRACT | 2 refills | Status: AC | PRN

## 2021-04-28 MED ORDER — OMEPRAZOLE 40 MG PO CPDR: 40.0000 mg | DELAYED_RELEASE_CAPSULE | Freq: Every day | ORAL | 1 refills | Status: AC

## 2021-04-28 MED ORDER — LISINOPRIL 5 MG PO TABS: 5.0000 mg | ORAL_TABLET | Freq: Every day | ORAL | 1 refills | Status: AC

## 2021-04-28 MED ORDER — LEVOTHYROXINE SODIUM 175 MCG PO TABS: 175.0000 ug | ORAL_TABLET | Freq: Every day | ORAL | 1 refills | Status: DC

## 2021-04-28 MED ORDER — METOPROLOL SUCCINATE ER 100 MG PO TB24: 100.0000 mg | ORAL_TABLET | Freq: Every day | ORAL | 1 refills | Status: AC

## 2021-04-28 MED ORDER — SIMVASTATIN 20 MG PO TABS: 20.0000 mg | ORAL_TABLET | Freq: Every day | ORAL | 1 refills | Status: AC

## 2021-04-28 MED ORDER — FERROUS SULFATE 325 (65 FE) MG PO TABS: 325.0000 mg | ORAL_TABLET | Freq: Every day | ORAL | 0 refills | Status: DC

## 2021-04-28 MED ORDER — FENOFIBRATE 145 MG PO TABS: 145.0000 mg | ORAL_TABLET | Freq: Every day | ORAL | 1 refills | Status: AC

## 2021-04-28 NOTE — Progress Notes (Signed)
Date:  04/28/2021   Name:  Angela Fernandez   DOB:  1945/08/22   MRN:  [id number]   Chief Complaint: COPD, Hypertension, Hyperlipidemia, Gastroesophageal Reflux, and Anemia  COPD There is no chest tightness, cough, difficulty breathing, frequent throat clearing, hemoptysis, hoarse voice, shortness of breath, sputum production or wheezing. This is a chronic problem. The current episode started more than 1 year ago. The problem occurs intermittently. The problem has been gradually improving. Pertinent negatives include no chest pain, dyspnea on exertion, ear pain, fever, headaches, myalgias, sneezing, sore throat or weight loss. She reports moderate improvement on treatment. Her symptoms are not alleviated by beta-agonist. Her past medical history is significant for COPD.  Hypertension This is a chronic problem. The current episode started more than 1 year ago. The problem has been gradually improving since onset. The problem is controlled. Pertinent negatives include no chest pain, headaches, neck pain, palpitations or shortness of breath. Past treatments include ACE inhibitors and beta blockers. The current treatment provides moderate improvement. There are no compliance problems.  There is no history of angina, kidney disease, CAD/MI, CVA, heart failure, left ventricular hypertrophy, PVD or retinopathy. Identifiable causes of hypertension include a thyroid problem. There is no history of chronic renal disease, a hypertension causing med or renovascular disease.  Hyperlipidemia This is a chronic problem. The current episode started more than 1 year ago. The problem is controlled. Recent lipid tests were reviewed and are normal. She has no history of chronic renal disease. Factors aggravating her hyperlipidemia include thiazides. Pertinent negatives include no chest pain, myalgias or shortness of breath. The current treatment provides moderate improvement of lipids. There are no compliance problems.    Gastroesophageal Reflux She reports no abdominal pain, no chest pain, no coughing, no dysphagia, no hoarse voice, no nausea, no sore throat or no wheezing. This is a new problem. The problem occurs frequently. The problem has been waxing and waning. Pertinent negatives include no weight loss. She has tried a PPI for the symptoms. The treatment provided moderate relief.  Anemia Presents for follow-up visit. There has been no abdominal pain, bruising/bleeding easily, confusion, fever, palpitations, paresthesias or weight loss. Signs of blood loss that are not present include hematochezia. There is no history of chronic renal disease or heart failure. There are no compliance problems.   Thyroid Problem Presents for follow-up visit. Patient reports no anxiety, cold intolerance, constipation, diarrhea, hoarse voice, palpitations, weight gain or weight loss. Her past medical history is significant for hyperlipidemia. There is no history of heart failure.   Lab Results  Component Value Date   NA 135 02/06/2021   K 4.3 02/06/2021   CO2 28 02/06/2021   GLUCOSE 77 02/06/2021   BUN 31 (H) 02/06/2021   CREATININE 0.98 02/06/2021   CALCIUM 9.6 02/06/2021   GFRNONAA >60 02/06/2021   Lab Results  Component Value Date   CHOL 182 04/28/2020   HDL 43 04/28/2020   LDLCALC 98 04/28/2020   TRIG 241 (H) 04/28/2020   Lab Results  Component Value Date   TSH 2.20 09/25/2020   TSH 2.29 09/25/2020   Lab Results  Component Value Date   HGBA1C 5.7 02/03/2021   Lab Results  Component Value Date   WBC 12.3 (H) 02/06/2021   HGB 12.5 02/06/2021   HCT 37.1 02/06/2021   MCV 96.6 02/06/2021   PLT 343 02/06/2021   Lab Results  Component Value Date   ALT 25 02/06/2021   AST 25 02/06/2021  ALKPHOS 26 (L) 02/06/2021   BILITOT 0.8 02/06/2021   No results found for: 25OHVITD2, 25OHVITD3, VD25OH   Review of Systems  Constitutional:  Negative for chills, fever, weight gain and weight loss.  HENT:   Negative for drooling, ear discharge, ear pain, hoarse voice, sneezing and sore throat.   Respiratory:  Negative for cough, hemoptysis, sputum production, shortness of breath and wheezing.   Cardiovascular:  Negative for chest pain, dyspnea on exertion, palpitations and leg swelling.  Gastrointestinal:  Negative for abdominal pain, blood in stool, constipation, diarrhea, dysphagia, hematochezia and nausea.  Endocrine: Negative for cold intolerance and polydipsia.  Genitourinary:  Negative for dysuria, frequency, hematuria and urgency.  Musculoskeletal:  Negative for back pain, myalgias and neck pain.  Skin:  Negative for rash.  Allergic/Immunologic: Negative for environmental allergies.  Neurological:  Negative for dizziness, headaches and paresthesias.  Hematological:  Does not bruise/bleed easily.  Psychiatric/Behavioral:  Negative for confusion and suicidal ideas. The patient is not nervous/anxious.    Patient Active Problem List   Diagnosis Date Noted   Cystocele with rectocele 04/10/2021   Mixed urinary incontinence due to female genital prolapse 04/10/2021   Submucous leiomyoma of uterus 04/10/2021   Thickened endometrium 04/10/2021   Pelvic pressure in female 02/11/2021   DJD (degenerative joint disease) of cervical spine 12/28/2020   Chronic venous insufficiency 12/24/2020   Lymphedema 12/24/2020   Diabetes ([place]) 12/24/2020   Hyperlipidemia 12/24/2020   Essential hypertension 12/24/2020   Closed fracture of neck of right scapula 10/17/2020   Status post reverse total shoulder replacement, right 09/02/2020   Rotator cuff arthropathy, right 07/14/2020    No Known Allergies  Past Surgical History:  Procedure Laterality Date   BILATERAL CARPAL TUNNEL RELEASE Bilateral    BREAST BIOPSY     BREAST SURGERY Right    lumpectomy   CATARACT EXTRACTION W/ INTRAOCULAR LENS  IMPLANT, BILATERAL Bilateral    CHOLECYSTECTOMY     COLONOSCOPY     ESOPHAGOGASTRODUODENOSCOPY     EYE  SURGERY     JOINT REPLACEMENT     NASAL SINUS SURGERY     REPLACEMENT TOTAL KNEE BILATERAL Bilateral    REVERSE SHOULDER ARTHROPLASTY Right 09/02/2020   Procedure: REVERSE SHOULDER ARTHROPLASTY;  Surgeon: Corky Mull, MD;  Location: [facility] ORS;  Service: Orthopedics;  Laterality: Right;   SHOULDER SURGERY Right 2020   rotator cuff repair    Social History   Tobacco Use   Smoking status: Former    Years: 54.00    Types: Cigarettes    Quit date: 05/10/2016    Years since quitting: 4.9   Smokeless tobacco: Never  Vaping Use   Vaping Use: Never used  Substance Use Topics   Alcohol use: Not Currently    Comment: quit in age 9's or 50's   Drug use: Not Currently    Types: Cocaine, Heroin, Marijuana, LSD    Comment: last used in age 89's     Medication list has been reviewed and updated.  Current Meds  Medication Sig   albuterol (VENTOLIN HFA) 108 (90 Base) MCG/ACT inhaler Inhale 2 puffs into the lungs every 6 (six) hours as needed for wheezing or shortness of breath.   buPROPion (WELLBUTRIN SR) 200 MG 12 hr tablet Take 1 tablet (200 mg total) by mouth 2 (two) times daily. Take 1 bid   clonazePAM (KLONOPIN) 1 MG tablet Take 1.5 mg by mouth daily. Take 1.5 tabs in the am, 1 tabs at noon, and 1 tab  at bedtime/ psych   cyclobenzaprine (FLEXERIL) 10 MG tablet Take 1 tablet (10 mg total) by mouth at bedtime.   desvenlafaxine (PRISTIQ) 50 MG 24 hr tablet Take 50 mg by mouth at bedtime.   docusate sodium (COLACE) 100 MG capsule Take 200 mg by mouth 2 (two) times daily.   fenofibrate (TRICOR) 145 MG tablet Take 1 tablet (145 mg total) by mouth daily. 4pm   ferrous sulfate 325 (65 FE) MG tablet Take 1 tablet (325 mg total) by mouth daily with breakfast.   Glucagon, rDNA, (GLUCAGON EMERGENCY) 1 MG KIT Inject 1 Device as directed as needed (hypoglycemia).   insulin glargine (LANTUS SOLOSTAR) 100 UNIT/ML Solostar Pen Inject 30 Units into the skin daily. 30 units daily into skin (Patient taking  differently: Inject 24 Units into the skin daily. 30 units daily into skin)   levothyroxine (SYNTHROID) 175 MCG tablet Take 1 tablet (175 mcg total) by mouth daily before breakfast.   lisinopril (ZESTRIL) 5 MG tablet Take 1 tablet (5 mg total) by mouth daily.   Lurasidone HCl (LATUDA) 120 MG TABS Take 1 tablet by mouth daily. Psych Takes at 4 pm   meloxicam (MOBIC) 7.5 MG tablet Take 1 tablet (7.5 mg total) by mouth daily.   metoprolol succinate (TOPROL-XL) 100 MG 24 hr tablet Take 1 tablet (100 mg total) by mouth daily. Take with or immediately following a meal.   Multiple Vitamins-Minerals (HAIR SKIN AND NAILS FORMULA PO) Take 1 tablet by mouth at bedtime.   Multiple Vitamins-Minerals (PRESERVISION AREDS PO) Take 1 capsule by mouth in the morning and at bedtime.   omeprazole (PRILOSEC) 40 MG capsule Take 1 capsule by mouth once daily   simvastatin (ZOCOR) 20 MG tablet Take 1 tablet (20 mg total) by mouth daily.   traZODone (DESYREL) 50 MG tablet Take 50 mg by mouth at bedtime. Take one tablet at bedtime/ psych   TRULICITY 3.90 ZE/0.9QZ SOPN INJECT 1 SYRINGEFUL SUBCUTANEOUSLY ONCE A WEEK   zolpidem (AMBIEN) 5 MG tablet Take 5 mg by mouth at bedtime.    PHQ 2/9 Scores 04/28/2021 01/24/2021 10/24/2020 02/19/2020  PHQ - 2 Score 0 4 1 3   PHQ- 9 Score 0 14 6 8     GAD 7 : Generalized Anxiety Score 04/28/2021 10/24/2020 02/19/2020  Nervous, Anxious, on Edge 0 2 0  Control/stop worrying 0 1 1  Worry too much - different things 0 0 0  Trouble relaxing 0 0 0  Restless 0 0 0  Easily annoyed or irritable 0 0 0  Afraid - awful might happen 0 0 0  Total GAD 7 Score 0 3 1  Anxiety Difficulty - Not difficult at all Somewhat difficult    BP Readings from Last 3 Encounters:  04/28/21 130/82  04/10/21 123/73  02/11/21 120/74    Physical Exam Vitals and nursing note reviewed.  Constitutional:      General: She is not in acute distress.    Appearance: She is not diaphoretic.  HENT:     Head:  Normocephalic and atraumatic.     Right Ear: External ear normal.     Left Ear: External ear normal.     Nose: Nose normal. No congestion or rhinorrhea.  Eyes:     General:        Right eye: No discharge.        Left eye: No discharge.     Conjunctiva/sclera: Conjunctivae normal.     Pupils: Pupils are equal, round, and reactive to light.  Neck:     Thyroid: No thyromegaly.     Vascular: No JVD.  Cardiovascular:     Rate and Rhythm: Normal rate and regular rhythm.     Heart sounds: Normal heart sounds. No murmur heard.   No friction rub. No gallop.  Pulmonary:     Effort: Pulmonary effort is normal.     Breath sounds: Normal breath sounds. No wheezing or rhonchi.  Abdominal:     General: Bowel sounds are normal.     Palpations: Abdomen is soft. There is no mass.     Tenderness: There is no abdominal tenderness. There is no guarding.  Musculoskeletal:        General: Normal range of motion.     Cervical back: Normal range of motion and neck supple.  Lymphadenopathy:     Cervical: No cervical adenopathy.  Skin:    General: Skin is warm and dry.     Capillary Refill: Capillary refill takes less than 2 seconds.     Findings: No bruising.  Neurological:     Mental Status: She is alert.     Deep Tendon Reflexes: Reflexes are normal and symmetric.    Wt Readings from Last 3 Encounters:  04/28/21 189 lb (85.7 kg)  04/10/21 191 lb 14.4 oz (87 kg)  02/11/21 187 lb 14.4 oz (85.2 kg)    BP 130/82    Pulse 76    Ht 5' 2"  (1.575 m)    Wt 189 lb (85.7 kg)    BMI 34.57 kg/m   Assessment and Plan:  1. Chronic obstructive pulmonary disease, unspecified COPD type (HCC) Chronic.  Controlled.  Stable.  Continue albuterol 1 to 2 puffs every 6 hours as needed for wheezing or shortness of breath.  Patient states that she is doing better with her breathing is able to walk without dyspnea. - albuterol (VENTOLIN HFA) 108 (90 Base) MCG/ACT inhaler; Inhale 2 puffs into the lungs every 6 (six)  hours as needed for wheezing or shortness of breath.  Dispense: 8 g; Refill: 2  2. Mixed hyperlipidemia Chronic.  Controlled.  Stable.  Continue Tricor 145 mg a day as well as simvastatin 20 mg once a day.  Will check lipid panel.  Current level of LDL control. - fenofibrate (TRICOR) 145 MG tablet; Take 1 tablet (145 mg total) by mouth daily. 4pm  Dispense: 90 tablet; Refill: 1 - simvastatin (ZOCOR) 20 MG tablet; Take 1 tablet (20 mg total) by mouth daily.  Dispense: 90 tablet; Refill: 1 - Lipid Panel With LDL/HDL Ratio  3. Iron deficiency anemia, unspecified iron deficiency anemia type Controlled.  Controlled.  Stable.  Chronic.  Continue ferrous sulfate 325 mg once a day. - ferrous sulfate 325 (65 FE) MG tablet; Take 1 tablet (325 mg total) by mouth daily with breakfast.  Dispense: 90 tablet; Refill: 0  4. Primary hypertension Chronic.  Controlled.  Stable.  Blood pressure today is 130/82.  There is no orthostatic dizziness.  Continue lisinopril 5 mg once a day.  We will also continue metoprolol XL 100 mg once a day.  Will check renal function panel in future - lisinopril (ZESTRIL) 5 MG tablet; Take 1 tablet (5 mg total) by mouth daily.  Dispense: 90 tablet; Refill: 1 - metoprolol succinate (TOPROL-XL) 100 MG 24 hr tablet; Take 1 tablet (100 mg total) by mouth daily. Take with or immediately following a meal.  Dispense: 90 tablet; Refill: 1  5. Gastroesophageal reflux disease, unspecified whether esophagitis present Chronic.  Controlled.  Stable.  Continue omeprazole 40 mg once a day. - omeprazole (PRILOSEC) 40 MG capsule; Take 1 capsule (40 mg total) by mouth daily.  Dispense: 90 capsule; Refill: 1  6. Hypothyroidism due to acquired atrophy of thyroid Chronic.  Controlled.  Stable.  Pending TSH we will likely continue levothyroxine 175 mcg daily. - levothyroxine (SYNTHROID) 175 MCG tablet; Take 1 tablet (175 mcg total) by mouth daily before breakfast.  Dispense: 90 tablet; Refill: 1 -  TSH  7. Need for pneumococcal vaccination Discussed and administered - Pneumococcal conjugate vaccine 20-valent (Prevnar 20)

## 2021-04-29 DIAGNOSIS — M542 Cervicalgia: Secondary | ICD-10-CM | POA: Diagnosis not present

## 2021-04-29 DIAGNOSIS — M5441 Lumbago with sciatica, right side: Secondary | ICD-10-CM | POA: Diagnosis not present

## 2021-04-29 DIAGNOSIS — M5442 Lumbago with sciatica, left side: Secondary | ICD-10-CM | POA: Diagnosis not present

## 2021-04-29 DIAGNOSIS — G8929 Other chronic pain: Secondary | ICD-10-CM | POA: Diagnosis not present

## 2021-04-29 LAB — TSH: TSH: 0.502 u[IU]/mL (ref 0.450–4.500)

## 2021-04-29 LAB — LIPID PANEL WITH LDL/HDL RATIO
Cholesterol, Total: 194 mg/dL (ref 100–199)
HDL: 40 mg/dL (ref >39)
LDL Chol Calc (NIH): 105 mg/dL — ABNORMAL HIGH (ref 0–99)
LDL/HDL Ratio: 2.6 ratio (ref 0.0–3.2)
Triglycerides: 284 mg/dL — ABNORMAL HIGH (ref 0–149)
VLDL Cholesterol Cal: 49 mg/dL — ABNORMAL HIGH (ref 5–40)

## 2021-05-21 DIAGNOSIS — M436 Torticollis: Secondary | ICD-10-CM | POA: Diagnosis not present

## 2021-05-21 DIAGNOSIS — M542 Cervicalgia: Secondary | ICD-10-CM | POA: Diagnosis not present

## 2021-05-21 DIAGNOSIS — M6281 Muscle weakness (generalized): Secondary | ICD-10-CM | POA: Diagnosis not present

## 2021-06-04 DIAGNOSIS — M542 Cervicalgia: Secondary | ICD-10-CM | POA: Diagnosis not present

## 2021-06-08 DIAGNOSIS — M542 Cervicalgia: Secondary | ICD-10-CM | POA: Diagnosis not present

## 2021-06-11 DIAGNOSIS — M542 Cervicalgia: Secondary | ICD-10-CM | POA: Diagnosis not present

## 2021-06-16 DIAGNOSIS — M5416 Radiculopathy, lumbar region: Secondary | ICD-10-CM | POA: Diagnosis not present

## 2021-06-16 DIAGNOSIS — M47812 Spondylosis without myelopathy or radiculopathy, cervical region: Secondary | ICD-10-CM | POA: Diagnosis not present

## 2021-06-16 DIAGNOSIS — M5136 Other intervertebral disc degeneration, lumbar region: Secondary | ICD-10-CM | POA: Diagnosis not present

## 2021-06-25 DIAGNOSIS — M542 Cervicalgia: Secondary | ICD-10-CM | POA: Diagnosis not present

## 2021-06-30 DIAGNOSIS — G8929 Other chronic pain: Secondary | ICD-10-CM | POA: Diagnosis not present

## 2021-06-30 DIAGNOSIS — M65321 Trigger finger, right index finger: Secondary | ICD-10-CM | POA: Diagnosis not present

## 2021-06-30 DIAGNOSIS — M9931 Osseous stenosis of neural canal of cervical region: Secondary | ICD-10-CM | POA: Diagnosis not present

## 2021-06-30 DIAGNOSIS — M48062 Spinal stenosis, lumbar region with neurogenic claudication: Secondary | ICD-10-CM | POA: Diagnosis not present

## 2021-06-30 DIAGNOSIS — M5441 Lumbago with sciatica, right side: Secondary | ICD-10-CM | POA: Diagnosis not present

## 2021-06-30 DIAGNOSIS — M5442 Lumbago with sciatica, left side: Secondary | ICD-10-CM | POA: Diagnosis not present

## 2021-06-30 DIAGNOSIS — M542 Cervicalgia: Secondary | ICD-10-CM | POA: Diagnosis not present

## 2021-07-02 DIAGNOSIS — M542 Cervicalgia: Secondary | ICD-10-CM | POA: Diagnosis not present

## 2021-07-08 DIAGNOSIS — M542 Cervicalgia: Secondary | ICD-10-CM | POA: Diagnosis not present

## 2021-07-10 DIAGNOSIS — M25511 Pain in right shoulder: Secondary | ICD-10-CM | POA: Diagnosis not present

## 2021-07-13 DIAGNOSIS — E119 Type 2 diabetes mellitus without complications: Secondary | ICD-10-CM | POA: Diagnosis not present

## 2021-07-13 DIAGNOSIS — M48062 Spinal stenosis, lumbar region with neurogenic claudication: Secondary | ICD-10-CM | POA: Diagnosis not present

## 2021-07-15 ENCOUNTER — Telehealth: Payer: Self-pay

## 2021-07-15 DIAGNOSIS — M65331 Trigger finger, right middle finger: Secondary | ICD-10-CM | POA: Diagnosis not present

## 2021-07-15 DIAGNOSIS — M65332 Trigger finger, left middle finger: Secondary | ICD-10-CM | POA: Diagnosis not present

## 2021-07-15 NOTE — Telephone Encounter (Signed)
Left voice mail to have patient call back to set up appointment for 19mns with Dr JRonnald Rampfor surgery clearance. ?

## 2021-07-16 DIAGNOSIS — M542 Cervicalgia: Secondary | ICD-10-CM | POA: Diagnosis not present

## 2021-07-20 ENCOUNTER — Telehealth: Payer: Self-pay

## 2021-07-20 NOTE — Telephone Encounter (Signed)
Called to speak with daughter Luetta Nutting and she said that she will call back a week before her surgery date to set up a surgery clearance app with dr Ronnald Ramp. ?

## 2021-07-28 DIAGNOSIS — G8929 Other chronic pain: Secondary | ICD-10-CM | POA: Diagnosis not present

## 2021-07-28 DIAGNOSIS — M5441 Lumbago with sciatica, right side: Secondary | ICD-10-CM | POA: Diagnosis not present

## 2021-07-28 DIAGNOSIS — M65321 Trigger finger, right index finger: Secondary | ICD-10-CM | POA: Diagnosis not present

## 2021-07-28 DIAGNOSIS — M542 Cervicalgia: Secondary | ICD-10-CM | POA: Diagnosis not present

## 2021-07-28 DIAGNOSIS — M48062 Spinal stenosis, lumbar region with neurogenic claudication: Secondary | ICD-10-CM | POA: Diagnosis not present

## 2021-07-28 DIAGNOSIS — M9931 Osseous stenosis of neural canal of cervical region: Secondary | ICD-10-CM | POA: Diagnosis not present

## 2021-07-28 DIAGNOSIS — M5442 Lumbago with sciatica, left side: Secondary | ICD-10-CM | POA: Diagnosis not present

## 2021-07-30 DIAGNOSIS — K023 Arrested dental caries: Secondary | ICD-10-CM | POA: Diagnosis not present

## 2021-07-31 DIAGNOSIS — M65331 Trigger finger, right middle finger: Secondary | ICD-10-CM | POA: Diagnosis not present

## 2021-07-31 DIAGNOSIS — M542 Cervicalgia: Secondary | ICD-10-CM | POA: Diagnosis not present

## 2021-08-02 ENCOUNTER — Other Ambulatory Visit: Payer: Self-pay | Admitting: Family Medicine

## 2021-08-02 DIAGNOSIS — D509 Iron deficiency anemia, unspecified: Secondary | ICD-10-CM

## 2021-08-03 DIAGNOSIS — R413 Other amnesia: Secondary | ICD-10-CM | POA: Diagnosis not present

## 2021-08-03 MED ORDER — FERROUS SULFATE 325 (65 FE) MG PO TABS: 325.0000 mg | ORAL_TABLET | Freq: Every day | ORAL | 0 refills | Status: DC

## 2021-08-06 ENCOUNTER — Ambulatory Visit
Admission: EM | Admit: 2021-08-06 | Discharge: 2021-08-06 | Disposition: A | Discharge status: Home / Self Care | Payer: Medicare Other | Attending: Emergency Medicine | Admitting: Emergency Medicine

## 2021-08-06 ENCOUNTER — Telehealth: Payer: Self-pay | Admitting: Family Medicine

## 2021-08-06 ENCOUNTER — Encounter: Payer: Self-pay | Admitting: Emergency Medicine

## 2021-08-06 DIAGNOSIS — R300 Dysuria: Secondary | ICD-10-CM | POA: Insufficient documentation

## 2021-08-06 DIAGNOSIS — M542 Cervicalgia: Secondary | ICD-10-CM | POA: Diagnosis not present

## 2021-08-06 LAB — POCT URINALYSIS DIP (MANUAL ENTRY)
Bilirubin, UA: NEGATIVE
Glucose, UA: NEGATIVE mg/dL
Ketones, POC UA: NEGATIVE mg/dL
Nitrite, UA: NEGATIVE
Protein Ur, POC: NEGATIVE mg/dL
Spec Grav, UA: 1.01 (ref 1.010–1.025)
Urobilinogen, UA: 0.2 E.U./dL
pH, UA: 5.5 (ref 5.0–8.0)

## 2021-08-06 MED ORDER — CEPHALEXIN 500 MG PO CAPS: 500.0000 mg | ORAL_CAPSULE | Freq: Two times a day (BID) | ORAL | 0 refills | Status: AC

## 2021-08-06 NOTE — ED Triage Notes (Signed)
Pt here with increased urinary frequency x 5 days. Pt states she has been on Amoxicillin, but it is unclear why. ?

## 2021-08-06 NOTE — Telephone Encounter (Signed)
Copied from [place] [id number]. Topic: General - Inquiry ?>> Aug 06, 2021  9:12 AM Angela Fernandez wrote: ?Reason for CRM: Pt called in stating she is going to [facility] this morning, but believes she may have a UTI, and unable to come in, pt asked if PCP could send over a order for her to get a urine sample while she is there, please advise. ?

## 2021-08-06 NOTE — Discharge Instructions (Addendum)
Take the antibiotic as directed.  The urine culture is pending.  We will call you if it shows the need to change or discontinue your antibiotic.    Follow up with your primary care provider if your symptoms are not improving.    

## 2021-08-06 NOTE — ED Provider Notes (Signed)
As for ?[facility] ? ? ? ?CSN: [id number] ?Arrival date & time: 08/06/21  1913 ? ? ?  ? ?History   ?Chief Complaint ?Chief Complaint  ?Patient presents with  ? Dysuria  ? ? ?HPI ?Angela Fernandez is a 76 y.o. female.  Accompanied by her daughter, patient presents with dysuria and urinary frequency for 5 days.  No fever, chills, abdominal pain, flank pain, vaginal discharge, pelvic pain, or other symptoms.  Patient is currently on amoxicillin which was prescribed on 07/17/2021 for a dental issue; last dose will be tomorrow.  Her medical history includes hypertension, diabetes, ED, chronic venous insufficiency, lymphedema, urinary incontinence, cystocele, bipolar, PTSD, schizoaffective disorder, TIA. ? ?The history is provided by the patient, a relative and medical records.  ? ?Past Medical History:  ?Diagnosis Date  ? Anemia   ? Bipolar affect, depressed ([place])   ? COPD (chronic obstructive pulmonary disease) ([place])   ? Diabetes mellitus without complication ([place])   ? type II  ? GERD (gastroesophageal reflux disease)   ? Hyperlipidemia   ? Hypertension   ? Hypothyroidism   ? PTSD (post-traumatic stress disorder)   ? Schizoaffective disorder ([place])   ? TIA (transient ischemic attack) 2010  ? UTI (urinary tract infection)   ? ? ?Patient Active Problem List  ? Diagnosis Date Noted  ? Cystocele with rectocele 04/10/2021  ? Mixed urinary incontinence due to female genital prolapse 04/10/2021  ? Submucous leiomyoma of uterus 04/10/2021  ? Thickened endometrium 04/10/2021  ? Pelvic pressure in female 02/11/2021  ? DJD (degenerative joint disease) of cervical spine 12/28/2020  ? Chronic venous insufficiency 12/24/2020  ? Lymphedema 12/24/2020  ? Diabetes ([place]) 12/24/2020  ? Hyperlipidemia 12/24/2020  ? Essential hypertension 12/24/2020  ? Closed fracture of neck of right scapula 10/17/2020  ? Status post reverse total shoulder replacement, right 09/02/2020  ? Rotator cuff arthropathy, right 07/14/2020  ? ? ?Past Surgical  History:  ?Procedure Laterality Date  ? BILATERAL CARPAL TUNNEL RELEASE Bilateral   ? BREAST BIOPSY    ? BREAST SURGERY Right   ? lumpectomy  ? CATARACT EXTRACTION W/ INTRAOCULAR LENS  IMPLANT, BILATERAL Bilateral   ? CHOLECYSTECTOMY    ? COLONOSCOPY    ? ESOPHAGOGASTRODUODENOSCOPY    ? EYE SURGERY    ? JOINT REPLACEMENT    ? NASAL SINUS SURGERY    ? REPLACEMENT TOTAL KNEE BILATERAL Bilateral   ? REVERSE SHOULDER ARTHROPLASTY Right 09/02/2020  ? Procedure: REVERSE SHOULDER ARTHROPLASTY;  Surgeon: Corky Mull, MD;  Location: [facility] ORS;  Service: Orthopedics;  Laterality: Right;  ? SHOULDER SURGERY Right 2020  ? rotator cuff repair  ? ? ?OB History   ? ? Gravida  ?2  ? Para  ?1  ? Term  ?1  ? Preterm  ?   ? AB  ?1  ? Living  ?1  ?  ? ? SAB  ?   ? IAB  ?   ? Ectopic  ?   ? Multiple  ?   ? Live Births  ?1  ?   ?  ?  ? ? ? ?Home Medications   ? ?Prior to Admission medications   ?Medication Sig Start Date End Date Taking? Authorizing Provider  ?cephALEXin (KEFLEX) 500 MG capsule Take 1 capsule (500 mg total) by mouth 2 (two) times daily for 5 days. 08/06/21 08/11/21 Yes Sharion Balloon, NP  ?albuterol (VENTOLIN HFA) 108 (90 Base) MCG/ACT inhaler Inhale 2 puffs into the lungs every 6 (  six) hours as needed for wheezing or shortness of breath. 04/28/21   Juline Patch, MD  ?amoxicillin (AMOXIL) 500 MG capsule Take by mouth. 07/17/21   [provider]  ?buPROPion (WELLBUTRIN SR) 200 MG 12 hr tablet Take 1 tablet (200 mg total) by mouth 2 (two) times daily. Take 1 bid    [provider]  ?clonazePAM (KLONOPIN) 1 MG tablet Take 1.5 mg by mouth daily. Take 1.5 tabs in the am, 1 tabs at noon, and 1 tab at bedtime/ psych    [provider]  ?cyclobenzaprine (FLEXERIL) 10 MG tablet Take 1 tablet (10 mg total) by mouth at bedtime. 09/06/20   Reche Dixon, PA-C  ?desvenlafaxine (PRISTIQ) 50 MG 24 hr tablet Take 50 mg by mouth at bedtime.    [provider]  ?docusate sodium (COLACE) 100 MG capsule Take  200 mg by mouth 2 (two) times daily.    [provider]  ?fenofibrate (TRICOR) 145 MG tablet Take 1 tablet (145 mg total) by mouth daily. 4pm 04/28/21   Juline Patch, MD  ?ferrous sulfate 325 (65 FE) MG tablet Take 1 tablet (325 mg total) by mouth daily with breakfast. 08/03/21   Juline Patch, MD  ?Glucagon, rDNA, (GLUCAGON EMERGENCY) 1 MG KIT Inject 1 Device as directed as needed (hypoglycemia). 04/18/20   Juline Patch, MD  ?insulin glargine (LANTUS SOLOSTAR) 100 UNIT/ML Solostar Pen Inject 30 Units into the skin daily. 30 units daily into skin ?Patient taking differently: Inject 24 Units into the skin daily. 30 units daily into skin 04/18/20   Juline Patch, MD  ?lactulose (CHRONULAC) 10 GM/15ML solution Take 10 g by mouth daily as needed. ?Patient not taking: Reported on 04/28/2021 02/19/20   [provider]  ?levothyroxine (SYNTHROID) 175 MCG tablet Take 1 tablet (175 mcg total) by mouth daily before breakfast. 04/28/21   Juline Patch, MD  ?lisinopril (ZESTRIL) 5 MG tablet Take 1 tablet (5 mg total) by mouth daily. 04/28/21   Juline Patch, MD  ?Lurasidone HCl (LATUDA) 120 MG TABS Take 1 tablet by mouth daily. Psych ?Takes at 4 pm    [provider]  ?meloxicam (MOBIC) 7.5 MG tablet Take 1 tablet (7.5 mg total) by mouth daily. 02/19/20   Juline Patch, MD  ?metoprolol succinate (TOPROL-XL) 100 MG 24 hr tablet Take 1 tablet (100 mg total) by mouth daily. Take with or immediately following a meal. 04/28/21   Juline Patch, MD  ?Multiple Vitamins-Minerals (HAIR SKIN AND NAILS FORMULA PO) Take 1 tablet by mouth at bedtime.    [provider]  ?Multiple Vitamins-Minerals (PRESERVISION AREDS PO) Take 1 capsule by mouth in the morning and at bedtime.    [provider]  ?omeprazole (PRILOSEC) 40 MG capsule Take 1 capsule (40 mg total) by mouth daily. 04/28/21   Juline Patch, MD  ?OVER THE COUNTER MEDICATION Take 1 Scoop by mouth daily in the afternoon.  Collagen protein powder ?Patient not taking: Reported on 04/28/2021    [provider]  ?Polyethyl Glycol-Propyl Glycol (SYSTANE OP) Apply 2 drops to eye 3 (three) times daily as needed. ?Patient not taking: Reported on 04/28/2021    [provider]  ?simvastatin (ZOCOR) 20 MG tablet Take 1 tablet (20 mg total) by mouth daily. 04/28/21   Juline Patch, MD  ?traZODone (DESYREL) 50 MG tablet Take 50 mg by mouth at bedtime. Take one tablet at bedtime/ psych    [provider]  ?  TRULICITY 9.75 PY/0.5RT SOPN INJECT 1 SYRINGEFUL SUBCUTANEOUSLY ONCE A WEEK 12/24/20   Juline Patch, MD  ?zolpidem (AMBIEN) 5 MG tablet Take 5 mg by mouth at bedtime.    [provider]  ? ? ?Family History ?Family History  ?Problem Relation Age of Onset  ? Alzheimer's disease Mother   ? Pancreatitis Father   ? Alzheimer's disease Maternal Grandmother   ? ? ?Social History ?Social History  ? ?Tobacco Use  ? Smoking status: Former  ?  Years: 54.00  ?  Types: Cigarettes  ?  Quit date: 05/10/2016  ?  Years since quitting: 5.2  ? Smokeless tobacco: Never  ?Vaping Use  ? Vaping Use: Never used  ?Substance Use Topics  ? Alcohol use: Not Currently  ?  Comment: quit in age 13's or 52's  ? Drug use: Not Currently  ?  Types: Cocaine, Heroin, Marijuana, LSD  ?  Comment: last used in age 27's  ? ? ? ?Allergies   ?Patient has no known allergies. ? ? ?Review of Systems ?Review of Systems  ?Constitutional:  Negative for chills and fever.  ?Gastrointestinal:  Negative for abdominal pain, nausea and vomiting.  ?Genitourinary:  Positive for dysuria and frequency. Negative for flank pain, hematuria, pelvic pain and vaginal discharge.  ?All other systems reviewed and are negative. ? ? ?Physical Exam ?Triage Vital Signs ?ED Triage Vitals  ?Enc Vitals Group  ?   BP   ?   Pulse   ?   Resp   ?   Temp   ?   Temp src   ?   SpO2   ?   Weight   ?   Height   ?   Head Circumference   ?   Peak Flow   ?   Pain Score   ?   Pain Loc   ?    Pain Edu?   ?   Excl. in [place]?   ? ?No data found. ? ?Updated Vital Signs ?BP 130/75   Pulse 84   Temp 98.1 ?F (36.7 ?C)   Resp 18   SpO2 98%  ? ?Visual Acuity ?Right Eye Distance:   ?Left Eye Distance:   ?B

## 2021-08-07 ENCOUNTER — Ambulatory Visit: Payer: Self-pay

## 2021-08-07 DIAGNOSIS — E119 Type 2 diabetes mellitus without complications: Secondary | ICD-10-CM | POA: Diagnosis not present

## 2021-08-07 DIAGNOSIS — M48062 Spinal stenosis, lumbar region with neurogenic claudication: Secondary | ICD-10-CM | POA: Diagnosis not present

## 2021-08-09 LAB — URINE CULTURE: Culture: 30000 — AB

## 2021-08-11 DIAGNOSIS — E119 Type 2 diabetes mellitus without complications: Secondary | ICD-10-CM | POA: Diagnosis not present

## 2021-08-17 DIAGNOSIS — E119 Type 2 diabetes mellitus without complications: Secondary | ICD-10-CM | POA: Diagnosis not present

## 2021-08-17 DIAGNOSIS — I10 Essential (primary) hypertension: Secondary | ICD-10-CM | POA: Diagnosis not present

## 2021-08-17 DIAGNOSIS — E782 Mixed hyperlipidemia: Secondary | ICD-10-CM | POA: Diagnosis not present

## 2021-08-18 DIAGNOSIS — M542 Cervicalgia: Secondary | ICD-10-CM | POA: Diagnosis not present

## 2021-08-21 ENCOUNTER — Encounter: Payer: Self-pay | Admitting: Family Medicine

## 2021-08-21 ENCOUNTER — Other Ambulatory Visit: Payer: Self-pay

## 2021-08-21 ENCOUNTER — Ambulatory Visit (INDEPENDENT_AMBULATORY_CARE_PROVIDER_SITE_OTHER): Payer: Medicare Other | Admitting: Family Medicine

## 2021-08-21 ENCOUNTER — Other Ambulatory Visit
Admission: RE | Admit: 2021-08-21 | Discharge: 2021-08-21 | Disposition: A | Discharge status: Home / Self Care | Payer: Medicare Other | Attending: Family Medicine | Admitting: Family Medicine

## 2021-08-21 VITALS — BP 126/82 | HR 76 | Ht 63.0 in | Wt 192.0 lb

## 2021-08-21 DIAGNOSIS — R350 Frequency of micturition: Secondary | ICD-10-CM

## 2021-08-21 DIAGNOSIS — E034 Atrophy of thyroid (acquired): Secondary | ICD-10-CM

## 2021-08-21 DIAGNOSIS — Z01818 Encounter for other preprocedural examination: Secondary | ICD-10-CM | POA: Diagnosis not present

## 2021-08-21 DIAGNOSIS — N8110 Cystocele, unspecified: Secondary | ICD-10-CM | POA: Diagnosis not present

## 2021-08-21 DIAGNOSIS — D649 Anemia, unspecified: Secondary | ICD-10-CM | POA: Diagnosis not present

## 2021-08-21 DIAGNOSIS — I10 Essential (primary) hypertension: Secondary | ICD-10-CM

## 2021-08-21 DIAGNOSIS — D509 Iron deficiency anemia, unspecified: Secondary | ICD-10-CM | POA: Diagnosis not present

## 2021-08-21 DIAGNOSIS — E119 Type 2 diabetes mellitus without complications: Secondary | ICD-10-CM | POA: Diagnosis not present

## 2021-08-21 DIAGNOSIS — Z01812 Encounter for preprocedural laboratory examination: Secondary | ICD-10-CM | POA: Insufficient documentation

## 2021-08-21 LAB — CBC WITH DIFFERENTIAL/PLATELET
Abs Immature Granulocytes: 0.06 10*3/uL (ref 0.00–0.07)
Basophils Absolute: 0.1 10*3/uL (ref 0.0–0.1)
Basophils Relative: 1 %
Eosinophils Absolute: 0.3 10*3/uL (ref 0.0–0.5)
Eosinophils Relative: 2 %
HCT: 35.9 % — ABNORMAL LOW (ref 36.0–46.0)
Hemoglobin: 12 g/dL (ref 12.0–15.0)
Immature Granulocytes: 0 %
Lymphocytes Relative: 45 %
Lymphs Abs: 6.6 10*3/uL — ABNORMAL HIGH (ref 0.7–4.0)
MCH: 31.9 pg (ref 26.0–34.0)
MCHC: 33.4 g/dL (ref 30.0–36.0)
MCV: 95.5 fL (ref 80.0–100.0)
Monocytes Absolute: 0.6 10*3/uL (ref 0.1–1.0)
Monocytes Relative: 4 %
Neutro Abs: 7.1 10*3/uL (ref 1.7–7.7)
Neutrophils Relative %: 48 %
Platelets: 323 10*3/uL (ref 150–400)
RBC: 3.76 MIL/uL — ABNORMAL LOW (ref 3.87–5.11)
RDW: 12.6 % (ref 11.5–15.5)
Smear Review: NORMAL
WBC: 14.8 10*3/uL — ABNORMAL HIGH (ref 4.0–10.5)
nRBC: 0 % (ref 0.0–0.2)

## 2021-08-21 LAB — POCT URINALYSIS DIPSTICK
Bilirubin, UA: NEGATIVE
Blood, UA: NEGATIVE
Glucose, UA: NEGATIVE
Ketones, UA: NEGATIVE
Leukocytes, UA: NEGATIVE
Nitrite, UA: NEGATIVE
Protein, UA: NEGATIVE
Spec Grav, UA: 1.01 (ref 1.010–1.025)
Urobilinogen, UA: 0.2 E.U./dL
pH, UA: 6 (ref 5.0–8.0)

## 2021-08-21 NOTE — Progress Notes (Signed)
? ? ?Date:  08/21/2021  ? ?Name:  Angela Fernandez   DOB:  [birth year]   MRN:  [id number] ? ? ?Chief Complaint: surgery clearance and Urinary Tract Infection ? ?Patient is a 76 year old female who presents for a preoperative exam. The patient reports the following problems: recurrent cystitis/last culture Klebsiella. Health maintenance has been reviewed up to date ?  ? ?Urinary Frequency  ?This is a new problem. The current episode started today. The problem occurs every urination. The problem has been waxing and waning. The quality of the pain is described as aching (just pressure). Associated symptoms include frequency. Pertinent negatives include no chills, hematuria, nausea or urgency.  ? ?Lab Results  ?Component Value Date  ? NA 135 02/06/2021  ? K 4.3 02/06/2021  ? CO2 28 02/06/2021  ? GLUCOSE 77 02/06/2021  ? BUN 31 (H) 02/06/2021  ? CREATININE 0.98 02/06/2021  ? CALCIUM 9.6 02/06/2021  ? GFRNONAA >60 02/06/2021  ? ?Lab Results  ?Component Value Date  ? CHOL 194 04/28/2021  ? HDL 40 04/28/2021  ? [place] 105 (H) 04/28/2021  ? TRIG 284 (H) 04/28/2021  ? ?Lab Results  ?Component Value Date  ? TSH 0.502 04/28/2021  ? ?Lab Results  ?Component Value Date  ? HGBA1C 5.7 02/03/2021  ? ?Lab Results  ?Component Value Date  ? WBC 12.3 (H) 02/06/2021  ? HGB 12.5 02/06/2021  ? HCT 37.1 02/06/2021  ? MCV 96.6 02/06/2021  ? PLT 343 02/06/2021  ? ?Lab Results  ?Component Value Date  ? ALT 25 02/06/2021  ? AST 25 02/06/2021  ? ALKPHOS 26 (L) 02/06/2021  ? BILITOT 0.8 02/06/2021  ? ?No results found for: 25OHVITD2, [place], VD25OH  ? ?Review of Systems  ?Constitutional:  Negative for chills and fever.  ?HENT:  Negative for drooling, ear discharge, ear pain and sore throat.   ?Respiratory:  Negative for cough, shortness of breath and wheezing.   ?Cardiovascular:  Negative for chest pain, palpitations and leg swelling.  ?Gastrointestinal:  Negative for abdominal pain, blood in stool, constipation, diarrhea and nausea.  ?Endocrine:  Negative for polydipsia.  ?Genitourinary:  Positive for frequency. Negative for dysuria, hematuria and urgency.  ?Musculoskeletal:  Negative for back pain, myalgias and neck pain.  ?Skin:  Negative for rash.  ?Allergic/Immunologic: Negative for environmental allergies.  ?Neurological:  Negative for dizziness and headaches.  ?Hematological:  Does not bruise/bleed easily.  ?Psychiatric/Behavioral:  Negative for suicidal ideas. The patient is not nervous/anxious.   ? ?Patient Active Problem List  ? Diagnosis Date Noted  ? Cystocele with rectocele 04/10/2021  ? Mixed urinary incontinence due to female genital prolapse 04/10/2021  ? Submucous leiomyoma of uterus 04/10/2021  ? Thickened endometrium 04/10/2021  ? Pelvic pressure in female 02/11/2021  ? DJD (degenerative joint disease) of cervical spine 12/28/2020  ? Chronic venous insufficiency 12/24/2020  ? Lymphedema 12/24/2020  ? Diabetes ([place]) 12/24/2020  ? Hyperlipidemia 12/24/2020  ? Essential hypertension 12/24/2020  ? Closed fracture of neck of right scapula 10/17/2020  ? Status post reverse total shoulder replacement, right 09/02/2020  ? Rotator cuff arthropathy, right 07/14/2020  ? ? ?No Known Allergies ? ?Past Surgical History:  ?Procedure Laterality Date  ? BILATERAL CARPAL TUNNEL RELEASE Bilateral   ? BREAST BIOPSY    ? BREAST SURGERY Right   ? lumpectomy  ? CATARACT EXTRACTION W/ INTRAOCULAR LENS  IMPLANT, BILATERAL Bilateral   ? CHOLECYSTECTOMY    ? COLONOSCOPY    ? ESOPHAGOGASTRODUODENOSCOPY    ? EYE  SURGERY    ? JOINT REPLACEMENT    ? NASAL SINUS SURGERY    ? REPLACEMENT TOTAL KNEE BILATERAL Bilateral   ? REVERSE SHOULDER ARTHROPLASTY Right 09/02/2020  ? Procedure: REVERSE SHOULDER ARTHROPLASTY;  Surgeon: Corky Mull, MD;  Location: [facility] ORS;  Service: Orthopedics;  Laterality: Right;  ? SHOULDER SURGERY Right 2020  ? rotator cuff repair  ? ? ?Social History  ? ?Tobacco Use  ? Smoking status: Former  ?  Years: 54.00  ?  Types: Cigarettes  ?  Quit date:  05/10/2016  ?  Years since quitting: 5.2  ? Smokeless tobacco: Never  ?Vaping Use  ? Vaping Use: Never used  ?Substance Use Topics  ? Alcohol use: Not Currently  ?  Comment: quit in age 27's or 32's  ? Drug use: Not Currently  ?  Types: Cocaine, Heroin, Marijuana, LSD  ?  Comment: last used in age 40's  ? ? ? ?Medication list has been reviewed and updated. ? ?Current Meds  ?Medication Sig  ? albuterol (VENTOLIN HFA) 108 (90 Base) MCG/ACT inhaler Inhale 2 puffs into the lungs every 6 (six) hours as needed for wheezing or shortness of breath.  ? buPROPion (WELLBUTRIN SR) 200 MG 12 hr tablet Take 1 tablet (200 mg total) by mouth 2 (two) times daily. Take 1 bid  ? clonazePAM (KLONOPIN) 1 MG tablet Take 1.5 mg by mouth daily. Take 1.5 tabs in the am, 1 tabs at noon, and 1 tab at bedtime/ psych  ? cyclobenzaprine (FLEXERIL) 10 MG tablet Take 1 tablet (10 mg total) by mouth at bedtime.  ? desvenlafaxine (PRISTIQ) 50 MG 24 hr tablet Take 50 mg by mouth at bedtime.  ? docusate sodium (COLACE) 100 MG capsule Take 200 mg by mouth 2 (two) times daily.  ? fenofibrate (TRICOR) 145 MG tablet Take 1 tablet (145 mg total) by mouth daily. 4pm  ? ferrous sulfate 325 (65 FE) MG tablet Take 1 tablet (325 mg total) by mouth daily with breakfast.  ? Glucagon, rDNA, (GLUCAGON EMERGENCY) 1 MG KIT Inject 1 Device as directed as needed (hypoglycemia).  ? insulin glargine (LANTUS SOLOSTAR) 100 UNIT/ML Solostar Pen Inject 30 Units into the skin daily. 30 units daily into skin (Patient taking differently: Inject 24 Units into the skin daily. 30 units daily into skin)  ? lactulose (CHRONULAC) 10 GM/15ML solution Take 10 g by mouth daily as needed.  ? levothyroxine (SYNTHROID) 175 MCG tablet Take 1 tablet (175 mcg total) by mouth daily before breakfast.  ? lisinopril (ZESTRIL) 5 MG tablet Take 1 tablet (5 mg total) by mouth daily.  ? Lurasidone HCl (LATUDA) 120 MG TABS Take 1 tablet by mouth daily. Psych ?Takes at 4 pm  ? meloxicam (MOBIC) 7.5 MG  tablet Take 1 tablet (7.5 mg total) by mouth daily.  ? metoprolol succinate (TOPROL-XL) 100 MG 24 hr tablet Take 1 tablet (100 mg total) by mouth daily. Take with or immediately following a meal.  ? Multiple Vitamins-Minerals (PRESERVISION AREDS PO) Take 1 capsule by mouth in the morning and at bedtime.  ? omeprazole (PRILOSEC) 40 MG capsule Take 1 capsule (40 mg total) by mouth daily.  ? Polyethyl Glycol-Propyl Glycol (SYSTANE OP) Apply 2 drops to eye 3 (three) times daily as needed.  ? simvastatin (ZOCOR) 20 MG tablet Take 1 tablet (20 mg total) by mouth daily.  ? traZODone (DESYREL) 50 MG tablet Take 50 mg by mouth at bedtime. Take one tablet at bedtime/ psych  ?  TRULICITY 0.35 WS/5.6CL SOPN INJECT 1 SYRINGEFUL SUBCUTANEOUSLY ONCE A WEEK  ? zolpidem (AMBIEN) 5 MG tablet Take 5 mg by mouth at bedtime.  ? [DISCONTINUED] Multiple Vitamins-Minerals (HAIR SKIN AND NAILS FORMULA PO) Take 1 tablet by mouth at bedtime.  ? ? ? ?  08/21/2021  ?  3:43 PM 04/28/2021  ?  9:18 AM 10/24/2020  ?  3:41 PM 02/19/2020  ?  3:46 PM  ?GAD 7 : Generalized Anxiety Score  ?Nervous, Anxious, on Edge 0 0 2 0  ?Control/stop worrying 1 0 1 1  ?Worry too much - different things 0 0 0 0  ?Trouble relaxing 0 0 0 0  ?Restless 0 0 0 0  ?Easily annoyed or irritable 0 0 0 0  ?Afraid - awful might happen 2 0 0 0  ?Total GAD 7 Score 3 0 3 1  ?Anxiety Difficulty Somewhat difficult  Not difficult at all Somewhat difficult  ? ? ? ?  08/21/2021  ?  3:40 PM  ?Depression screen PHQ 2/9  ?Decreased Interest 2  ?Down, Depressed, Hopeless 2  ?PHQ - 2 Score 4  ?Altered sleeping 2  ?Tired, decreased energy 1  ?Change in appetite 0  ?Feeling bad or failure about yourself  1  ?Trouble concentrating 0  ?Moving slowly or fidgety/restless 0  ?Suicidal thoughts 1  ?PHQ-9 Score 9  ?Difficult doing work/chores Somewhat difficult  ? ? ?BP Readings from Last 3 Encounters:  ?08/21/21 126/82  ?08/06/21 130/75  ?04/28/21 130/82  ? ? ?Physical Exam ?Vitals and nursing note  reviewed. Exam conducted with a chaperone present.  ?Constitutional:   ?   General: She is not in acute distress. ?   Appearance: She is not diaphoretic.  ?HENT:  ?   Head: Normocephalic and atraumatic.  ?

## 2021-08-24 LAB — URINE CULTURE

## 2021-08-25 ENCOUNTER — Other Ambulatory Visit: Payer: Self-pay | Admitting: Physical Medicine & Rehabilitation

## 2021-08-25 DIAGNOSIS — M5442 Lumbago with sciatica, left side: Secondary | ICD-10-CM | POA: Diagnosis not present

## 2021-08-25 DIAGNOSIS — M9931 Osseous stenosis of neural canal of cervical region: Secondary | ICD-10-CM

## 2021-08-25 DIAGNOSIS — G8929 Other chronic pain: Secondary | ICD-10-CM | POA: Diagnosis not present

## 2021-08-25 DIAGNOSIS — M48062 Spinal stenosis, lumbar region with neurogenic claudication: Secondary | ICD-10-CM | POA: Diagnosis not present

## 2021-08-25 DIAGNOSIS — M5441 Lumbago with sciatica, right side: Secondary | ICD-10-CM | POA: Diagnosis not present

## 2021-08-25 DIAGNOSIS — M542 Cervicalgia: Secondary | ICD-10-CM | POA: Diagnosis not present

## 2021-08-28 DIAGNOSIS — M65331 Trigger finger, right middle finger: Secondary | ICD-10-CM | POA: Diagnosis not present
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
## 2021-09-10 ENCOUNTER — Encounter: Payer: Self-pay | Admitting: Psychiatry

## 2021-09-10 ENCOUNTER — Ambulatory Visit (INDEPENDENT_AMBULATORY_CARE_PROVIDER_SITE_OTHER): Payer: Medicare Other | Admitting: Psychiatry

## 2021-09-10 VITALS — BP 136/79 | HR 79 | Temp 97.7°F | Wt 191.0 lb

## 2021-09-10 DIAGNOSIS — F411 Generalized anxiety disorder: Secondary | ICD-10-CM

## 2021-09-10 DIAGNOSIS — F4310 Post-traumatic stress disorder, unspecified: Secondary | ICD-10-CM | POA: Diagnosis not present

## 2021-09-10 DIAGNOSIS — F259 Schizoaffective disorder, unspecified: Secondary | ICD-10-CM | POA: Diagnosis not present

## 2021-09-10 NOTE — Telephone Encounter (Signed)
This encounter was created in error - please disregard.

## 2021-09-21 ENCOUNTER — Encounter: Payer: Self-pay | Admitting: Family Medicine

## 2021-09-21 ENCOUNTER — Other Ambulatory Visit: Payer: Self-pay

## 2021-09-21 DIAGNOSIS — E119 Type 2 diabetes mellitus without complications: Secondary | ICD-10-CM

## 2021-09-21 MED ORDER — TRULICITY 0.75 MG/0.5ML ~~LOC~~ SOAJ: 0.7500 mg | SUBCUTANEOUS | 2 refills | Status: AC

## 2021-09-24 NOTE — Discharge Instructions (Signed)

## 2021-09-25 ENCOUNTER — Ambulatory Visit
Admission: RE | Admit: 2021-09-25 | Discharge: 2021-09-25 | Disposition: A | Discharge status: Home / Self Care | Payer: Medicare Other | Source: Ambulatory Visit | Attending: Physical Medicine & Rehabilitation | Admitting: Physical Medicine & Rehabilitation

## 2021-09-25 DIAGNOSIS — M47812 Spondylosis without myelopathy or radiculopathy, cervical region: Secondary | ICD-10-CM | POA: Diagnosis not present

## 2021-09-25 DIAGNOSIS — M9931 Osseous stenosis of neural canal of cervical region: Secondary | ICD-10-CM

## 2021-09-25 IMAGING — XA DG INJECT/[PERSON_NAME] INC NEEDLE/CATH/PLC EPI/CERV/THOR W/IMG
2 series · 2 of 2 positions shown · non-contrast
Comparison: none

CLINICAL DATA: Cervical spondylosis without myelopathy. Neck and
shoulder pain and numbness, left greater than right.

[Series 1: ortho adipose · 1 of 1 slices shown (1 of 2)]
[im 1/1]
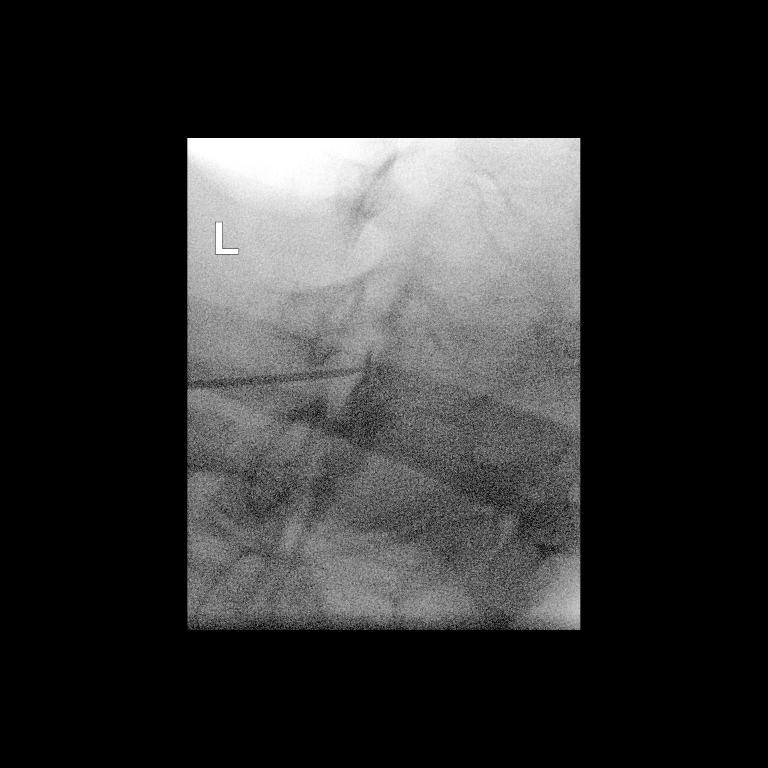

[Series 2: ortho adipose · 1 of 1 slices shown (2 of 2)]
[im 1/1]
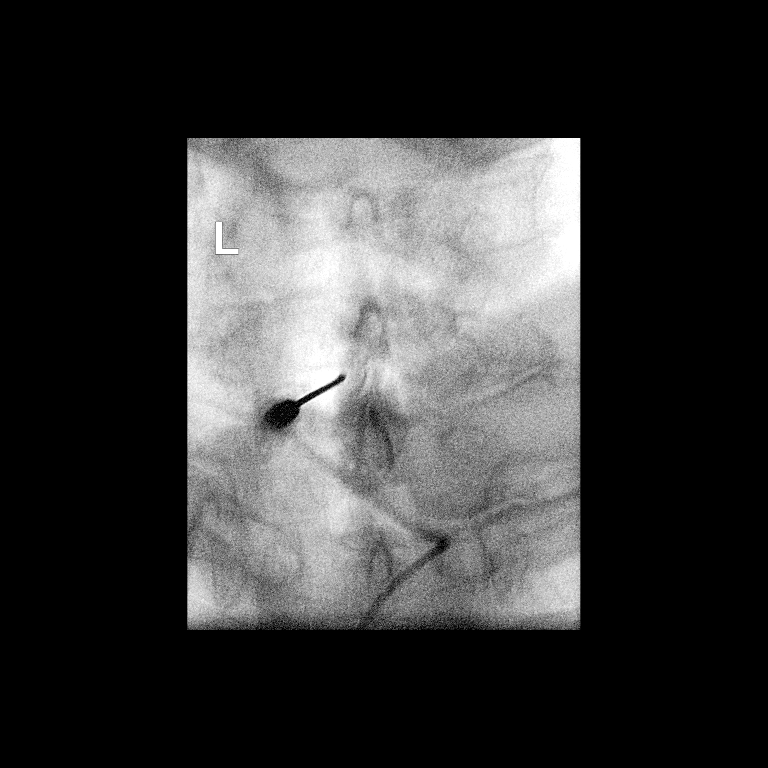

[2 of 2 positions shown; findings below may reference images not displayed]

FLUOROSCOPY:
Radiation Exposure Index (as provided by the fluoroscopic device):
1.70 mGy Kerma

PROCEDURE:
The procedure, risks, benefits, and alternatives were explained to
the patient. Questions regarding the procedure were encouraged and
answered. The patient understands and consents to the procedure.

CERVICAL EPIDURAL INJECTION

An interlaminar approach was performed on the left at C7-T1. A
inch 20 gauge epidural needle was advanced using loss-of-resistance
technique.

DIAGNOSTIC EPIDURAL INJECTION

Injection of Isovue-M 300 shows a good epidural pattern with spread
above and below the level of needle placement, primarily on the left
and in the midline. No vascular opacification is seen.

THERAPEUTIC EPIDURAL INJECTION

1.5 ml of Kenalog 40 mixed with 2 ml of normal saline were then
instilled. The procedure was well-tolerated, and the patient was
discharged thirty minutes following the injection in good condition.
IMPRESSION: Technically successful interlaminar epidural injection on the left
at C7-T1.

## 2021-09-25 MED ORDER — IOPAMIDOL (ISOVUE-M 300) INJECTION 61%
1.0000 mL | Freq: Once | INTRAMUSCULAR | Status: AC | PRN
  Administered 2021-09-25: 1 mL via EPIDURAL

## 2021-09-25 MED ORDER — TRIAMCINOLONE ACETONIDE 40 MG/ML IJ SUSP (RADIOLOGY)
60.0000 mg | Freq: Once | INTRAMUSCULAR | Status: AC
  Administered 2021-09-25: 60 mg via EPIDURAL

## 2021-09-29 ENCOUNTER — Ambulatory Visit: Payer: Medicare Other | Admitting: Psychiatry

## 2021-10-02 ENCOUNTER — Ambulatory Visit: Payer: Medicare Other | Admitting: Family Medicine

## 2021-10-08 DIAGNOSIS — M542 Cervicalgia: Secondary | ICD-10-CM | POA: Diagnosis not present

## 2021-10-08 DIAGNOSIS — Z122 Encounter for screening for malignant neoplasm of respiratory organs: Secondary | ICD-10-CM | POA: Diagnosis not present

## 2021-10-08 DIAGNOSIS — Z0000 Encounter for general adult medical examination without abnormal findings: Secondary | ICD-10-CM | POA: Diagnosis not present

## 2021-10-08 DIAGNOSIS — I10 Essential (primary) hypertension: Secondary | ICD-10-CM | POA: Diagnosis not present

## 2021-10-08 DIAGNOSIS — E119 Type 2 diabetes mellitus without complications: Secondary | ICD-10-CM | POA: Diagnosis not present

## 2021-10-08 DIAGNOSIS — Z794 Long term (current) use of insulin: Secondary | ICD-10-CM | POA: Diagnosis not present

## 2021-10-08 DIAGNOSIS — M19011 Primary osteoarthritis, right shoulder: Secondary | ICD-10-CM | POA: Diagnosis not present

## 2021-10-08 DIAGNOSIS — J449 Chronic obstructive pulmonary disease, unspecified: Secondary | ICD-10-CM | POA: Diagnosis not present

## 2021-10-08 DIAGNOSIS — Z1389 Encounter for screening for other disorder: Secondary | ICD-10-CM | POA: Diagnosis not present

## 2021-10-08 DIAGNOSIS — E039 Hypothyroidism, unspecified: Secondary | ICD-10-CM | POA: Diagnosis not present

## 2021-10-08 DIAGNOSIS — E785 Hyperlipidemia, unspecified: Secondary | ICD-10-CM | POA: Diagnosis not present

## 2021-10-08 NOTE — Progress Notes (Addendum)
Anesthesia Review:  PCP: Otilio Miu- 08/21/21- Preop clearance visit  DR fitzgerald - PCP- [facility]  Cardiologist : Chest x-ray : EKG : 02/09/21  Echo : Stress test: Cardiac Cath :  Activity level:  Sleep Study/ CPAP : Fasting Blood Sugar :      / Checks Blood Sugar -- times a day:   Blood Thinner/ Instructions /Last Dose: ASA / Instructions/ Last Dose :   DM- type 2  Checks gliucose daily in am per pt  Hgba1c- 10/12/21-  5.2  09/15/2021- trigger finger surgery  09/10/21- Behavioral Medicine visit  AT preop visit- one of granddaughters came with pt to preop visit.  PT could tell me date of birth but could not tell me name of surgeon.   Called surgeon office and  [facility]  with surgery scheduler and made them aware pt did not know who her surgeon was.  Kerrie called from from office and stated that daughter Luetta Nutting usually comes with pt to preop appt.  And stated daughter, Luetta Nutting to be with pt day of surgery.

## 2021-10-09 NOTE — Progress Notes (Signed)
DUE TO COVID-19 ONLY ONE VISITOR IS ALLOWED TO COME WITH YOU AND STAY IN THE WAITING ROOM ONLY DURING PRE OP AND PROCEDURE DAY OF SURGERY.  2 VISITOR  MAY VISIT WITH YOU AFTER SURGERY IN YOUR PRIVATE ROOM DURING VISITING HOURS ONLY! YOU MAY HAVE ONE PERSON SPEND THE NITE WITH YOU IN YOUR ROOM AFTER SURGERY.      Your procedure is scheduled on:             10/23/2021   Report to [facility] Main  Entrance   Report to admitting at     52           AM DO NOT BRING INSURANCE CARD, PICTURE ID OR WALLET DAY OF SURGERY.      Call this number if you have problems the morning of surgery [phone]    REMEMBER: NO  SOLID FOODS , CANDY, GUM OR MINTS AFTER [place] .       Marland Kitchen CLEAR LIQUIDS UNTIL   1015             DAY OF SURGERY.      PLEASE FINISH  g2 lower sugar DRINK PER SURGEON ORDER  WHICH NEEDS TO BE COMPLETED AT   1015 am        MORNING OF SURGERY.       CLEAR LIQUID DIET   Foods Allowed      WATER BLACK COFFEE ( SUGAR OK, NO MILK, CREAM OR CREAMER) REGULAR AND DECAF  TEA ( SUGAR OK NO MILK, CREAM, OR CREAMER) REGULAR AND DECAF  PLAIN JELLO ( NO RED)  FRUIT ICES ( NO RED, NO FRUIT PULP)  POPSICLES ( NO RED)  JUICE- APPLE, WHITE GRAPE AND WHITE CRANBERRY  SPORT DRINK LIKE GATORADE ( NO RED)  CLEAR BROTH ( VEGETABLE , CHICKEN OR BEEF)                                                                     BRUSH YOUR TEETH MORNING OF SURGERY AND RINSE YOUR MOUTH OUT, NO CHEWING GUM CANDY OR MINTS.     Take these medicines the morning of surgery with A SIP OF WATER:  synthroid, wellbutrin, inhalers as usual and bring, toprol, clonazepam, eye  drops as usual    DO NOT TAKE ANY DIABETIC MEDICATIONS DAY OF YOUR SURGERY                               You may not have any metal on your body including hair pins and              piercings  Do not wear jewelry, make-up, lotions, powders or perfumes, deodorant             Do not wear nail polish on your  fingernails.              IF YOU ARE A FEMALE AND WANT TO SHAVE UNDER ARMS OR LEGS PRIOR TO SURGERY YOU MUST DO SO AT LEAST 48 HOURS PRIOR TO SURGERY.              Men may shave face and neck.   Do not bring  valuables to the hospital. [place].  Contacts, dentures or bridgework may not be worn into surgery.  Leave suitcase in the car. After surgery it may be brought to your room.     Patients discharged the day of surgery will not be allowed to drive home. IF YOU ARE HAVING SURGERY AND GOING HOME THE SAME DAY, YOU MUST HAVE AN ADULT TO DRIVE YOU HOME AND BE WITH YOU FOR 24 HOURS. YOU MAY GO HOME BY TAXI OR UBER OR ORTHERWISE, BUT AN ADULT MUST ACCOMPANY YOU HOME AND STAY WITH YOU FOR 24 HOURS.                Please read over the following fact sheets you were given: _____________________________________________________________________  [facility] - Preparing for Surgery Before surgery, you can play an important role.  Because skin is not sterile, your skin needs to be as free of germs as possible.  You can reduce the number of germs on your skin by washing with CHG (chlorahexidine gluconate) soap before surgery.  CHG is an antiseptic cleaner which kills germs and bonds with the skin to continue killing germs even after washing. Please DO NOT use if you have an allergy to CHG or antibacterial soaps.  If your skin becomes reddened/irritated stop using the CHG and inform your nurse when you arrive at Short Stay. Do not shave (including legs and underarms) for at least 48 hours prior to the first CHG shower.  You may shave your face/neck. Please follow these instructions carefully:  1.  Shower with CHG Soap the night before surgery and the  morning of Surgery.  2.  If you choose to wash your hair, wash your hair first as usual with your  normal  shampoo.  3.  After you shampoo, rinse your hair and body thoroughly to remove the  shampoo.                            4.  Use CHG as you would any other liquid soap.  You can apply chg directly  to the skin and wash                       Gently with a scrungie or clean washcloth.  5.  Apply the CHG Soap to your body ONLY FROM THE NECK DOWN.   Do not use on face/ open                           Wound or open sores. Avoid contact with eyes, ears mouth and genitals (private parts).                       Wash face,  Genitals (private parts) with your normal soap.             6.  Wash thoroughly, paying special attention to the area where your surgery  will be performed.  7.  Thoroughly rinse your body with warm water from the neck down.  8.  DO NOT shower/wash with your normal soap after using and rinsing off  the CHG Soap.                9.  Pat yourself dry with a clean towel.  10.  Wear clean pajamas.            11.  Place clean sheets on your bed the night of your first shower and do not  sleep with pets. Day of Surgery : Do not apply any lotions/deodorants the morning of surgery.  Please wear clean clothes to the hospital/surgery center.  FAILURE TO FOLLOW THESE INSTRUCTIONS MAY RESULT IN THE CANCELLATION OF YOUR SURGERY PATIENT SIGNATURE_________________________________  NURSE SIGNATURE__________________________________  ________________________________________________________________________
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
## 2021-10-12 ENCOUNTER — Other Ambulatory Visit (HOSPITAL_COMMUNITY): Payer: Medicare Other

## 2021-10-12 ENCOUNTER — Other Ambulatory Visit: Payer: Self-pay

## 2021-10-12 ENCOUNTER — Encounter (HOSPITAL_COMMUNITY)
Admission: RE | Admit: 2021-10-12 | Discharge: 2021-10-12 | Disposition: A | Discharge status: Home / Self Care | Payer: Medicare Other | Source: Ambulatory Visit | Attending: Orthopedic Surgery | Admitting: Orthopedic Surgery

## 2021-10-12 ENCOUNTER — Encounter (HOSPITAL_COMMUNITY): Payer: Self-pay

## 2021-10-12 VITALS — BP 132/72 | HR 71 | Temp 97.7°F | Resp 16 | Ht 64.5 in | Wt 183.0 lb

## 2021-10-12 DIAGNOSIS — Z01818 Encounter for other preprocedural examination: Secondary | ICD-10-CM

## 2021-10-12 DIAGNOSIS — Z01812 Encounter for preprocedural laboratory examination: Secondary | ICD-10-CM | POA: Insufficient documentation

## 2021-10-12 DIAGNOSIS — E119 Type 2 diabetes mellitus without complications: Secondary | ICD-10-CM | POA: Diagnosis not present

## 2021-10-12 HISTORY — DX: Sleep apnea, unspecified: G47.30

## 2021-10-12 HISTORY — DX: Unspecified osteoarthritis, unspecified site: M19.90

## 2021-10-12 HISTORY — DX: Cerebral infarction, unspecified: I63.9

## 2021-10-12 LAB — HEMOGLOBIN A1C
Hgb A1c MFr Bld: 5.2 % (ref 4.8–5.6)
Mean Plasma Glucose: 102.54 mg/dL

## 2021-10-12 LAB — GLUCOSE, CAPILLARY: Glucose-Capillary: 107 mg/dL — ABNORMAL HIGH (ref 70–99)

## 2021-10-12 LAB — CBC
HCT: 38 % (ref 36.0–46.0)
Hemoglobin: 12.2 g/dL (ref 12.0–15.0)
MCH: 31.7 pg (ref 26.0–34.0)
MCHC: 32.1 g/dL (ref 30.0–36.0)
MCV: 98.7 fL (ref 80.0–100.0)
Platelets: 304 10*3/uL (ref 150–400)
RBC: 3.85 MIL/uL — ABNORMAL LOW (ref 3.87–5.11)
RDW: 12.9 % (ref 11.5–15.5)
WBC: 10.4 10*3/uL (ref 4.0–10.5)
nRBC: 0 % (ref 0.0–0.2)

## 2021-10-12 LAB — BASIC METABOLIC PANEL
Anion gap: 8 (ref 5–15)
BUN: 26 mg/dL — ABNORMAL HIGH (ref 8–23)
CO2: 26 mmol/L (ref 22–32)
Calcium: 9.8 mg/dL (ref 8.9–10.3)
Chloride: 107 mmol/L (ref 98–111)
Creatinine, Ser: 0.92 mg/dL (ref 0.44–1.00)
GFR, Estimated: >60 mL/min (ref >60)
Glucose, Bld: 98 mg/dL (ref 70–99)
Potassium: 4.3 mmol/L (ref 3.5–5.1)
Sodium: 141 mmol/L (ref 135–145)

## 2021-10-12 LAB — SURGICAL PCR SCREEN
MRSA, PCR: NEGATIVE
Staphylococcus aureus: POSITIVE — AB

## 2021-10-12 NOTE — Progress Notes (Signed)
- Preparing for Total Shoulder Arthroplasty  °  °Before surgery, you can play an important role. Because skin is not sterile, your skin needs to be as free of germs as possible. You can reduce the number of germs on your skin by using the following products. °Benzoyl Peroxide Gel °Reduces the number of germs present on the skin °Applied twice a day to shoulder area starting two days before surgery   ° °================================================================== ° °Please follow these instructions carefully: ° °BENZOYL PEROXIDE 5% GEL ° °Please do not use if you have an allergy to benzoyl peroxide.   If your skin becomes reddened/irritated stop using the benzoyl peroxide. ° °Starting two days before surgery, apply as follows: °Apply benzoyl peroxide in the morning and at night. Apply after taking a shower. If you are not taking a shower clean entire shoulder front, back, and side along with the armpit with a clean wet washcloth. ° °Place a quarter-sized dollop on your shoulder and rub in thoroughly, making sure to cover the front, back, and side of your shoulder, along with the armpit.  ° °2 days before ____ AM   ____ PM              1 day before ____ AM   ____ PM °                        °Do this twice a day for two days.  (Last application is the night before surgery, AFTER using the CHG soap as described below). ° °Do NOT apply benzoyl peroxide gel on the day of surgery.  °

## 2021-10-13 ENCOUNTER — Ambulatory Visit (INDEPENDENT_AMBULATORY_CARE_PROVIDER_SITE_OTHER): Payer: Medicare Other | Admitting: Psychiatry

## 2021-10-13 ENCOUNTER — Other Ambulatory Visit (HOSPITAL_COMMUNITY): Payer: Medicare Other

## 2021-10-13 ENCOUNTER — Encounter: Payer: Self-pay | Admitting: Psychiatry

## 2021-10-13 VITALS — BP 145/79 | HR 76 | Temp 98.7°F | Wt 189.4 lb

## 2021-10-13 DIAGNOSIS — R4189 Other symptoms and signs involving cognitive functions and awareness: Secondary | ICD-10-CM

## 2021-10-13 DIAGNOSIS — F259 Schizoaffective disorder, unspecified: Secondary | ICD-10-CM | POA: Diagnosis not present

## 2021-10-13 DIAGNOSIS — F4310 Post-traumatic stress disorder, unspecified: Secondary | ICD-10-CM

## 2021-10-13 DIAGNOSIS — G4700 Insomnia, unspecified: Secondary | ICD-10-CM | POA: Diagnosis not present

## 2021-10-13 DIAGNOSIS — F411 Generalized anxiety disorder: Secondary | ICD-10-CM

## 2021-10-15 ENCOUNTER — Telehealth: Payer: Self-pay

## 2021-10-15 NOTE — Telephone Encounter (Signed)
pt called states that she seen dr. Melrose Nakayama at [facility] he does not believe she has dementia or alzheimer. she wants to know why you felt she had a mild case of dementai.  she also wants to know if she needs to see whoever you wanted to send her too. Please call patient.

## 2021-10-15 NOTE — Telephone Encounter (Signed)
Discussed with the patient.  She states that she was told by Dr. Melrose Nakayama that she does not have dementia.  She asks why this Probation officer thinks she has dementia, and if she should continue to see him. (According to the chart review, she also declined OT eval).  She was informed that this writer is concerned about her cognition due to the mini cog test done at the recent visit.  She states that although she may forget taking medication at times, she has been doing well until she moved to her daughter's place. She agrees to get this formal OT evaluation to see how she is doing. She states that she does not want the result to be shared with her daughter. She agrees to discuss it with them.

## 2021-10-16 ENCOUNTER — Ambulatory Visit: Payer: Medicare Other | Admitting: Family Medicine

## 2021-10-19 ENCOUNTER — Ambulatory Visit (INDEPENDENT_AMBULATORY_CARE_PROVIDER_SITE_OTHER): Payer: Medicare Other | Admitting: Urology

## 2021-10-19 VITALS — BP 146/79 | HR 78 | Ht 64.0 in | Wt 187.0 lb

## 2021-10-19 DIAGNOSIS — N390 Urinary tract infection, site not specified: Secondary | ICD-10-CM

## 2021-10-19 DIAGNOSIS — N8110 Cystocele, unspecified: Secondary | ICD-10-CM

## 2021-10-19 DIAGNOSIS — N3020 Other chronic cystitis without hematuria: Secondary | ICD-10-CM

## 2021-10-19 LAB — URINALYSIS, COMPLETE
Bilirubin, UA: NEGATIVE
Glucose, UA: NEGATIVE
Ketones, UA: NEGATIVE
Nitrite, UA: NEGATIVE
Protein,UA: NEGATIVE
RBC, UA: NEGATIVE
Specific Gravity, UA: 1.02 (ref 1.005–1.030)
Urobilinogen, Ur: 0.2 mg/dL (ref 0.2–1.0)
pH, UA: 7 (ref 5.0–7.5)

## 2021-10-19 LAB — MICROSCOPIC EXAMINATION

## 2021-10-19 MED ORDER — TRIMETHOPRIM 100 MG PO TABS: 100.0000 mg | ORAL_TABLET | Freq: Every day | ORAL | 11 refills | Status: DC

## 2021-10-19 NOTE — Progress Notes (Signed)
10/19/2021 1:37 PM   Angela Fernandez [birth year] [id number]  Referring provider: Juline Patch, MD [address] [place],  [place] [address]  Chief Complaint  Patient presents with   Bladder Prolapse   HPI: I was consulted to assess the patient's recurrent bladder infections.  She has had about 8 infections each year with small-volume frequency urgency pressure and dysuria that respond favorably antibiotics.  At baseline she is continent.  She voids every 2-3 hours gets up once at night  She has no hysterectomy.  She has had a TIA.  She is on insulin for diabetes  No bladder surgery kidney stones   PMH: Past Medical History:  Diagnosis Date   Anemia    Arthritis    Bipolar affect, depressed ([place])    COPD (chronic obstructive pulmonary disease) (HCC)    Diabetes mellitus without complication (HCC)    type II   GERD (gastroesophageal reflux disease)    Hyperlipidemia    Hypertension    Hypothyroidism    PTSD (post-traumatic stress disorder)    Schizoaffective disorder ([place])    Sleep apnea    cpap   Stroke ([place])    hx of  mini stroke - 2001   TIA (transient ischemic attack) 2010   UTI (urinary tract infection)     Surgical History: Past Surgical History:  Procedure Laterality Date   BILATERAL CARPAL TUNNEL RELEASE Bilateral    BREAST BIOPSY     BREAST SURGERY Right    lumpectomy   CATARACT EXTRACTION W/ INTRAOCULAR LENS  IMPLANT, BILATERAL Bilateral    CHOLECYSTECTOMY     COLONOSCOPY     ESOPHAGOGASTRODUODENOSCOPY     EYE SURGERY     JOINT REPLACEMENT     NASAL SINUS SURGERY     REPLACEMENT TOTAL KNEE BILATERAL Bilateral    REVERSE SHOULDER ARTHROPLASTY Right 09/02/2020   Procedure: REVERSE SHOULDER ARTHROPLASTY;  Surgeon: Corky Mull, MD;  Location: [facility] ORS;  Service: Orthopedics;  Laterality: Right;   SHOULDER SURGERY Right 2020   rotator cuff repair    Home Medications:  Allergies as of 10/19/2021   No Known Allergies       Medication List        Accurate as of October 19, 2021  1:37 PM. If you have any questions, ask your nurse or doctor.          albuterol 108 (90 Base) MCG/ACT inhaler Commonly known as: VENTOLIN HFA Inhale 2 puffs into the lungs every 6 (six) hours as needed for wheezing or shortness of breath.   amoxicillin 500 MG capsule Commonly known as: AMOXIL Take 500-2,000 mg by mouth See admin instructions. Take 500 mg 3 times daily as needed for uti symptoms, take 2000 mg 1 hour prior to dental work   Anoro Ellipta 62.5-25 MCG/ACT Aepb Generic drug: umeclidinium-vilanterol Inhale 2 puffs into the lungs daily as needed (shortness of breath).   AYR SALINE NASAL GEL NA Place 1 application. into the nose every other day.   buPROPion 200 MG 12 hr tablet Commonly known as: WELLBUTRIN SR Take 200 mg by mouth 2 (two) times daily.   clonazePAM 1 MG tablet Commonly known as: KLONOPIN Take 1-1.5 mg by mouth See admin instructions. Take 1.5 mg in the am, 1 mg in the evening, and 1 mg at night   desvenlafaxine 50 MG 24 hr tablet Commonly known as: PRISTIQ Take 1 tablet by mouth at bedtime.   Desvenlafaxine ER 50 MG Tb24 Commonly known  as: PRISTIQ Take 1 tablet by mouth at bedtime. psych   docusate sodium 100 MG capsule Commonly known as: COLACE Take 200 mg by mouth 2 (two) times daily.   fenofibrate 145 MG tablet Commonly known as: TRICOR Take 1 tablet (145 mg total) by mouth daily. 4pm   ferrous sulfate 325 (65 FE) MG tablet Take 1 tablet (325 mg total) by mouth daily with breakfast.   gabapentin 300 MG capsule Commonly known as: NEURONTIN Take 300 mg by mouth at bedtime.   Lantus SoloStar 100 UNIT/ML Solostar Pen Generic drug: insulin glargine Inject 24 Units into the skin daily.   Latuda 120 MG Tabs Generic drug: Lurasidone HCl Take 120 mg by mouth daily.   levothyroxine 175 MCG tablet Commonly known as: SYNTHROID Take 1 tablet (175 mcg total) by mouth daily before  breakfast.   lisinopril 5 MG tablet Commonly known as: ZESTRIL Take 1 tablet (5 mg total) by mouth daily.   meloxicam 7.5 MG tablet Commonly known as: MOBIC Take 1 tablet (7.5 mg total) by mouth daily.   metoprolol succinate 100 MG 24 hr tablet Commonly known as: TOPROL-XL Take 1 tablet (100 mg total) by mouth daily. Take with or immediately following a meal.   omeprazole 40 MG capsule Commonly known as: PRILOSEC Take 1 capsule (40 mg total) by mouth daily.   ondansetron 4 MG tablet Commonly known as: ZOFRAN Take 4 mg by mouth 2 (two) times daily as needed for nausea or vomiting.   OVER THE COUNTER MEDICATION Take 1 Scoop by mouth daily in the afternoon. Collagen protein powder   PreserVision AREDS 2 Caps Take 1 capsule by mouth 2 (two) times daily.   Hair/Skin/Nails Tabs Take 1 tablet by mouth daily.   simvastatin 20 MG tablet Commonly known as: ZOCOR Take 1 tablet (20 mg total) by mouth daily.   tiZANidine 4 MG tablet Commonly known as: ZANAFLEX Take 2-4 mg by mouth every 8 (eight) hours as needed for muscle spasms.   traZODone 50 MG tablet Commonly known as: DESYREL Take 50 mg by mouth at bedtime. Take one tablet at bedtime/ psych   Trulicity 7.34 LP/3.7TK Sopn Generic drug: Dulaglutide Inject 0.75 mg into the skin once a week.   VITAMIN E (TOPICAL) Crea Apply 1 application. topically daily.   VITAMIN E (TOPICAL) Crea Apply topically.   zolpidem 5 MG tablet Commonly known as: AMBIEN Take 5 mg by mouth at bedtime.        Allergies: No Known Allergies  Family History: Family History  Problem Relation Age of Onset   Alzheimer's disease Mother    Alcohol abuse Father    Schizophrenia Father    Pancreatitis Father    Alcohol abuse Maternal Grandfather    Alzheimer's disease Maternal Grandmother     Social History:  reports that she quit smoking about 5 years ago. Her smoking use included cigarettes. She has never used smokeless tobacco. She  reports that she does not currently use alcohol. She reports current drug use.  ROS:                                        Physical Exam: BP (!) 146/79   Pulse 78   Ht '5\' 4"'$  (1.626 m)   Wt 187 lb (84.8 kg)   BMI 32.10 kg/m   Constitutional:  Alert and oriented, No acute distress. HEENT: [place] AT, moist  mucus membranes.  Trachea midline, no masses. Cardiovascular: No clubbing, cyanosis, or edema. Respiratory: Normal respiratory effort, no increased work of breathing. GI: Abdomen is soft, nontender, nondistended, no abdominal masses GU: Well supported bladder neck and a small asymptomatic grade 1 cystocele Skin: No rashes, bruises or suspicious lesions. Lymph: No cervical or inguinal adenopathy. Neurologic: Grossly intact, no focal deficits, moving all 4 extremities. Psychiatric: Normal mood and affect.  Laboratory Data: Lab Results  Component Value Date   WBC 10.4 10/12/2021   HGB 12.2 10/12/2021   HCT 38.0 10/12/2021   MCV 98.7 10/12/2021   PLT 304 10/12/2021    Lab Results  Component Value Date   CREATININE 0.92 10/12/2021    No results found for: "PSA"  No results found for: "TESTOSTERONE"  Lab Results  Component Value Date   HGBA1C 5.2 10/12/2021    Urinalysis    Component Value Date/Time   COLORURINE STRAW (A) 02/06/2021 1630   APPEARANCEUR CLEAR (A) 02/06/2021 1630   LABSPEC 1.003 (L) 02/06/2021 1630   PHURINE 6.0 02/06/2021 1630   GLUCOSEU NEGATIVE 02/06/2021 1630   HGBUR NEGATIVE 02/06/2021 1630   BILIRUBINUR negative 08/21/2021 1648   KETONESUR negative 08/06/2021 1930   KETONESUR NEGATIVE 02/06/2021 1630   PROTEINUR Negative 08/21/2021 1648   PROTEINUR NEGATIVE 02/06/2021 1630   UROBILINOGEN 0.2 08/21/2021 1648   NITRITE negative 08/21/2021 1648   NITRITE NEGATIVE 02/06/2021 1630   LEUKOCYTESUR Negative 08/21/2021 1648   LEUKOCYTESUR TRACE (A) 02/06/2021 1630    Pertinent Imaging: Urine reviewed.  Urine sent for  culture.  Chart reviewed  Assessment & Plan: Patient has recurrent bladder infections.  Reassess in 8 weeks Trimethoprim prophylaxis.  30x11 sent to the pharmacy.  Call if culture positive.  Call if renal ultrasound abnormal  1. Female bladder prolapse  - Urinalysis, Complete   No follow-ups on file.  Reece Packer, MD  [place] [address], [place], [place] [address] [phone]

## 2021-10-20 DIAGNOSIS — M542 Cervicalgia: Secondary | ICD-10-CM | POA: Diagnosis not present

## 2021-10-20 DIAGNOSIS — M5442 Lumbago with sciatica, left side: Secondary | ICD-10-CM | POA: Diagnosis not present

## 2021-10-20 DIAGNOSIS — G8929 Other chronic pain: Secondary | ICD-10-CM | POA: Diagnosis not present

## 2021-10-20 DIAGNOSIS — M9931 Osseous stenosis of neural canal of cervical region: Secondary | ICD-10-CM | POA: Diagnosis not present

## 2021-10-20 DIAGNOSIS — M5441 Lumbago with sciatica, right side: Secondary | ICD-10-CM | POA: Diagnosis not present

## 2021-10-20 DIAGNOSIS — M48062 Spinal stenosis, lumbar region with neurogenic claudication: Secondary | ICD-10-CM | POA: Diagnosis not present

## 2021-10-22 LAB — CULTURE, URINE COMPREHENSIVE

## 2021-10-23 ENCOUNTER — Inpatient Hospital Stay (HOSPITAL_COMMUNITY)
Admission: RE | Admit: 2021-10-23 | Discharge: 2021-10-25 | DRG: 483 | Disposition: A | Discharge status: Home / Self Care | Payer: Medicare Other | Attending: Orthopedic Surgery | Admitting: Orthopedic Surgery

## 2021-10-23 ENCOUNTER — Other Ambulatory Visit: Payer: Self-pay

## 2021-10-23 ENCOUNTER — Inpatient Hospital Stay (HOSPITAL_COMMUNITY): Payer: Medicare Other

## 2021-10-23 ENCOUNTER — Inpatient Hospital Stay (HOSPITAL_COMMUNITY): Payer: Medicare Other | Admitting: Physician Assistant

## 2021-10-23 ENCOUNTER — Encounter (HOSPITAL_COMMUNITY): Payer: Self-pay | Admitting: Orthopedic Surgery

## 2021-10-23 ENCOUNTER — Encounter (HOSPITAL_COMMUNITY)
Admission: RE | Disposition: A | Discharge status: Home / Self Care | Payer: Self-pay | Source: Home / Self Care | Attending: Orthopedic Surgery

## 2021-10-23 ENCOUNTER — Inpatient Hospital Stay (HOSPITAL_COMMUNITY): Payer: Medicare Other | Admitting: Anesthesiology

## 2021-10-23 DIAGNOSIS — J449 Chronic obstructive pulmonary disease, unspecified: Secondary | ICD-10-CM | POA: Diagnosis present

## 2021-10-23 DIAGNOSIS — T84028A Dislocation of other internal joint prosthesis, initial encounter: Principal | ICD-10-CM | POA: Diagnosis present

## 2021-10-23 DIAGNOSIS — Z96653 Presence of artificial knee joint, bilateral: Secondary | ICD-10-CM | POA: Diagnosis present

## 2021-10-23 DIAGNOSIS — Z79890 Hormone replacement therapy (postmenopausal): Secondary | ICD-10-CM

## 2021-10-23 DIAGNOSIS — Z794 Long term (current) use of insulin: Secondary | ICD-10-CM

## 2021-10-23 DIAGNOSIS — Z87891 Personal history of nicotine dependence: Secondary | ICD-10-CM

## 2021-10-23 DIAGNOSIS — E119 Type 2 diabetes mellitus without complications: Secondary | ICD-10-CM | POA: Diagnosis present

## 2021-10-23 DIAGNOSIS — Z9842 Cataract extraction status, left eye: Secondary | ICD-10-CM

## 2021-10-23 DIAGNOSIS — Z811 Family history of alcohol abuse and dependence: Secondary | ICD-10-CM

## 2021-10-23 DIAGNOSIS — Z8673 Personal history of transient ischemic attack (TIA), and cerebral infarction without residual deficits: Secondary | ICD-10-CM

## 2021-10-23 DIAGNOSIS — Z79899 Other long term (current) drug therapy: Secondary | ICD-10-CM | POA: Diagnosis not present

## 2021-10-23 DIAGNOSIS — F259 Schizoaffective disorder, unspecified: Secondary | ICD-10-CM | POA: Diagnosis present

## 2021-10-23 DIAGNOSIS — I10 Essential (primary) hypertension: Secondary | ICD-10-CM

## 2021-10-23 DIAGNOSIS — M25511 Pain in right shoulder: Secondary | ICD-10-CM | POA: Diagnosis present

## 2021-10-23 DIAGNOSIS — K219 Gastro-esophageal reflux disease without esophagitis: Secondary | ICD-10-CM | POA: Diagnosis not present

## 2021-10-23 DIAGNOSIS — Z01818 Encounter for other preprocedural examination: Secondary | ICD-10-CM

## 2021-10-23 DIAGNOSIS — F4310 Post-traumatic stress disorder, unspecified: Secondary | ICD-10-CM | POA: Diagnosis present

## 2021-10-23 DIAGNOSIS — Z791 Long term (current) use of non-steroidal anti-inflammatories (NSAID): Secondary | ICD-10-CM

## 2021-10-23 DIAGNOSIS — T84018A Broken internal joint prosthesis, other site, initial encounter: Secondary | ICD-10-CM | POA: Diagnosis not present

## 2021-10-23 DIAGNOSIS — Z9841 Cataract extraction status, right eye: Secondary | ICD-10-CM | POA: Diagnosis not present

## 2021-10-23 DIAGNOSIS — Z96611 Presence of right artificial shoulder joint: Principal | ICD-10-CM

## 2021-10-23 DIAGNOSIS — Z820 Family history of epilepsy and other diseases of the nervous system: Secondary | ICD-10-CM | POA: Diagnosis not present

## 2021-10-23 DIAGNOSIS — T84098A Other mechanical complication of other internal joint prosthesis, initial encounter: Secondary | ICD-10-CM | POA: Diagnosis not present

## 2021-10-23 DIAGNOSIS — Z961 Presence of intraocular lens: Secondary | ICD-10-CM | POA: Diagnosis not present

## 2021-10-23 DIAGNOSIS — Y792 Prosthetic and other implants, materials and accessory orthopedic devices associated with adverse incidents: Secondary | ICD-10-CM | POA: Diagnosis present

## 2021-10-23 DIAGNOSIS — Z471 Aftercare following joint replacement surgery: Secondary | ICD-10-CM | POA: Diagnosis not present

## 2021-10-23 DIAGNOSIS — T84068A Wear of articular bearing surface of other internal prosthetic joint, initial encounter: Secondary | ICD-10-CM | POA: Diagnosis present

## 2021-10-23 DIAGNOSIS — E785 Hyperlipidemia, unspecified: Secondary | ICD-10-CM | POA: Diagnosis not present

## 2021-10-23 DIAGNOSIS — T84038A Mechanical loosening of other internal prosthetic joint, initial encounter: Secondary | ICD-10-CM | POA: Diagnosis not present

## 2021-10-23 DIAGNOSIS — G8918 Other acute postprocedural pain: Secondary | ICD-10-CM | POA: Diagnosis not present

## 2021-10-23 HISTORY — PX: TOTAL SHOULDER REVISION: SHX6130

## 2021-10-23 LAB — GLUCOSE, CAPILLARY
Glucose-Capillary: 85 mg/dL (ref 70–99)
Glucose-Capillary: 90 mg/dL (ref 70–99)
Glucose-Capillary: 129 mg/dL — ABNORMAL HIGH (ref 70–99)

## 2021-10-23 IMAGING — DX DG SHOULDER 1V*R*
1 series · 1 of 1 positions shown · non-contrast
Comparison: Shoulder radiograph dated [DATE]

CLINICAL DATA: Status post reverse total shoulder arthroplasty

EXAM:
RIGHT SHOULDER - 1 VIEW

[shoulder ap]
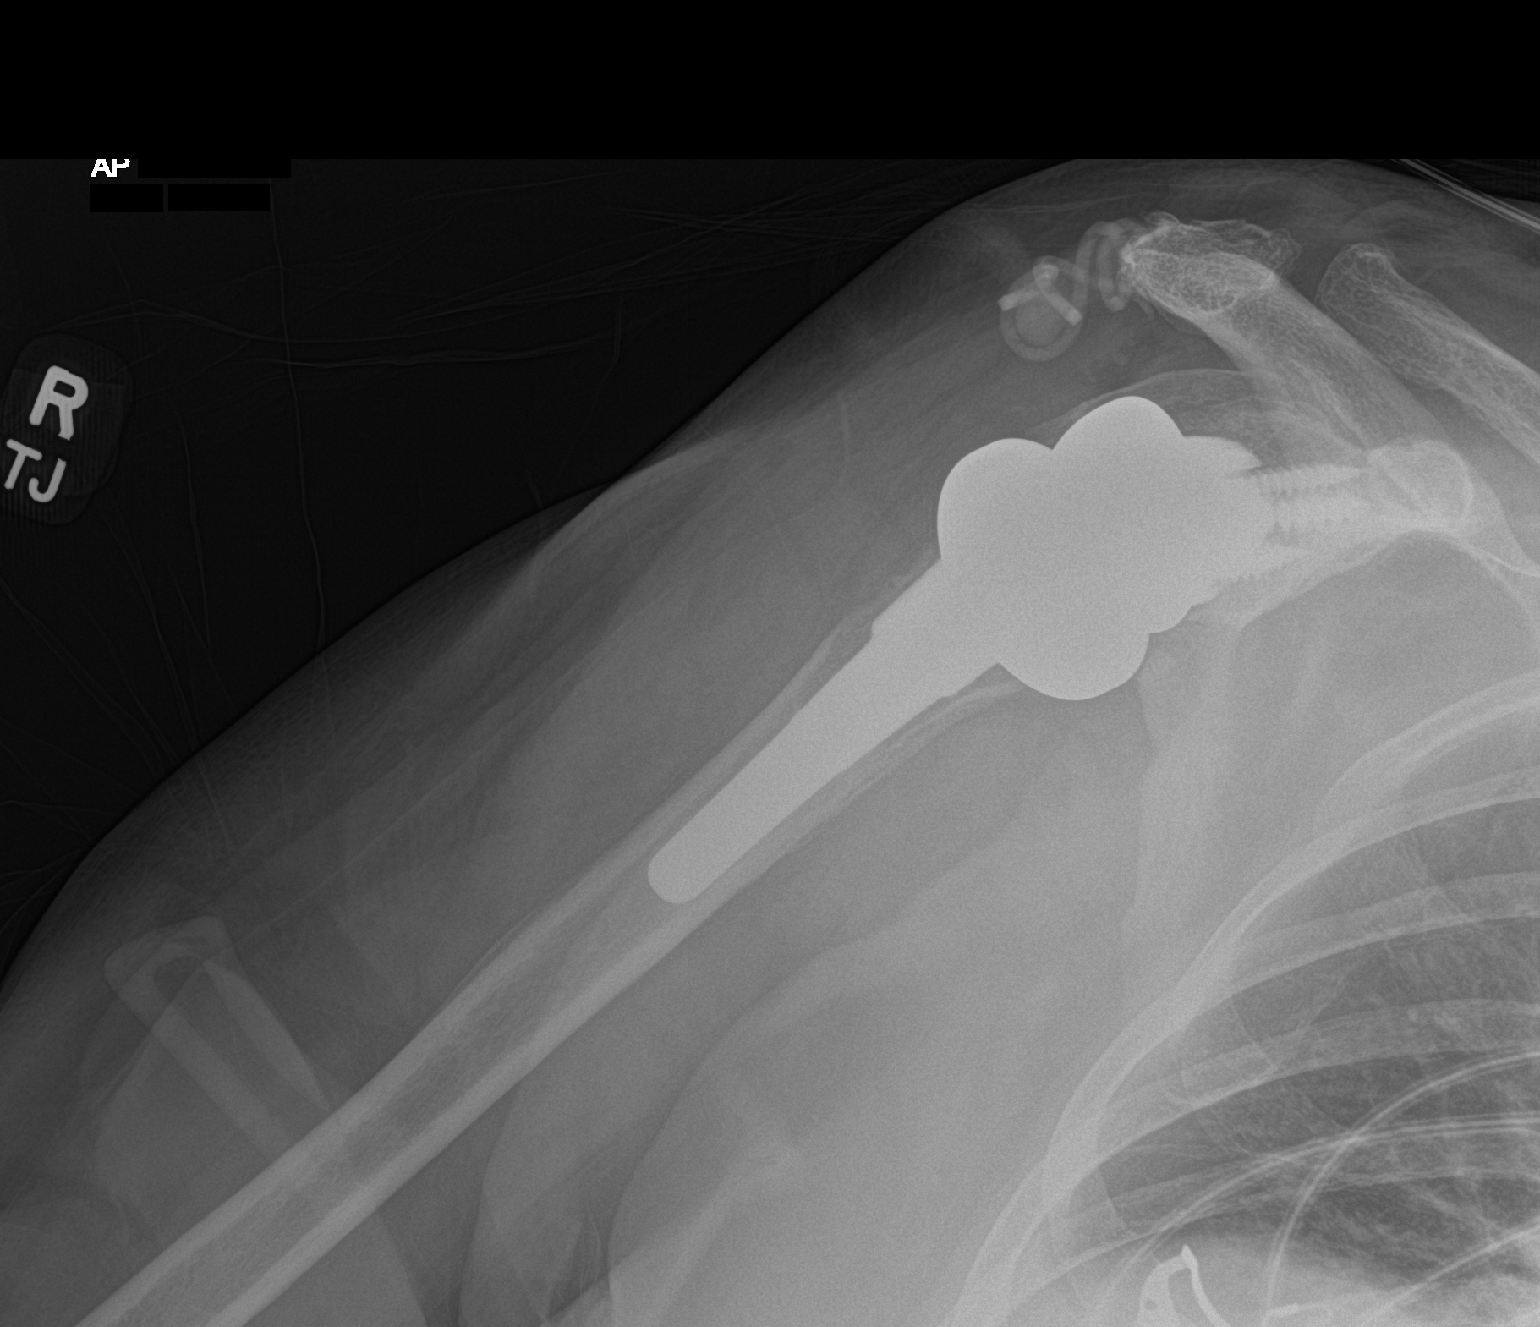

[1 of 1 positions shown; findings below may reference images not displayed]

FINDINGS: Status post reverse total shoulder arthroplasty. No evidence of
periprosthetic fracture or lucency. Soft tissues are unremarkable.
IMPRESSION: Status post right reverse total shoulder arthroplasty with no
evidence of complication on single image.

## 2021-10-23 SURGERY — REVISION, TOTAL ARTHROPLASTY, SHOULDER
Anesthesia: General | Site: Shoulder | Laterality: Right

## 2021-10-23 MED ORDER — PHENYLEPHRINE 80 MCG/ML (10ML) SYRINGE FOR IV PUSH (FOR BLOOD PRESSURE SUPPORT)
PREFILLED_SYRINGE | INTRAVENOUS | Status: DC | PRN
  Administered 2021-10-23: 80 ug via INTRAVENOUS

## 2021-10-23 MED ORDER — LURASIDONE HCL 40 MG PO TABS
120.0000 mg | ORAL_TABLET | Freq: Every day | ORAL | Status: DC
  Administered 2021-10-23 – 2021-10-25 (×3): 120 mg via ORAL
  Filled 2021-10-23 (×3): qty 3

## 2021-10-23 MED ORDER — SODIUM CHLORIDE 0.9 % IR SOLN
Status: DC | PRN
  Administered 2021-10-23: 1000 mL

## 2021-10-23 MED ORDER — DESVENLAFAXINE ER 50 MG PO TB24: 1.0000 | ORAL_TABLET | Freq: Every day | ORAL | Status: DC

## 2021-10-23 MED ORDER — ROCURONIUM BROMIDE 10 MG/ML (PF) SYRINGE
PREFILLED_SYRINGE | INTRAVENOUS | Status: DC | PRN
  Administered 2021-10-23: 10 mg via INTRAVENOUS
  Administered 2021-10-23: 60 mg via INTRAVENOUS

## 2021-10-23 MED ORDER — AMISULPRIDE (ANTIEMETIC) 5 MG/2ML IV SOLN: 10.0000 mg | Freq: Once | INTRAVENOUS | Status: DC | PRN

## 2021-10-23 MED ORDER — LACTATED RINGERS IV SOLN: INTRAVENOUS | Status: DC

## 2021-10-23 MED ORDER — DOCUSATE SODIUM 100 MG PO CAPS: 100.0000 mg | ORAL_CAPSULE | Freq: Two times a day (BID) | ORAL | Status: DC

## 2021-10-23 MED ORDER — METOCLOPRAMIDE HCL 5 MG PO TABS: 5.0000 mg | ORAL_TABLET | Freq: Three times a day (TID) | ORAL | Status: DC | PRN

## 2021-10-23 MED ORDER — ZOLPIDEM TARTRATE 5 MG PO TABS
5.0000 mg | ORAL_TABLET | Freq: Every day | ORAL | Status: DC
  Administered 2021-10-23 – 2021-10-24 (×2): 5 mg via ORAL
  Filled 2021-10-23 (×2): qty 1

## 2021-10-23 MED ORDER — INSULIN ASPART 100 UNIT/ML IJ SOLN: 0.0000 [IU] | Freq: Every day | INTRAMUSCULAR | Status: DC

## 2021-10-23 MED ORDER — ALBUTEROL SULFATE (2.5 MG/3ML) 0.083% IN NEBU: 2.5000 mg | INHALATION_SOLUTION | Freq: Four times a day (QID) | RESPIRATORY_TRACT | Status: DC | PRN

## 2021-10-23 MED ORDER — SUGAMMADEX SODIUM 200 MG/2ML IV SOLN
INTRAVENOUS | Status: DC | PRN
  Administered 2021-10-23: 200 mg via INTRAVENOUS

## 2021-10-23 MED ORDER — METOCLOPRAMIDE HCL 5 MG/ML IJ SOLN: 5.0000 mg | Freq: Three times a day (TID) | INTRAMUSCULAR | Status: DC | PRN

## 2021-10-23 MED ORDER — LIDOCAINE 2% (20 MG/ML) 5 ML SYRINGE
INTRAMUSCULAR | Status: DC | PRN
  Administered 2021-10-23: 80 mg via INTRAVENOUS

## 2021-10-23 MED ORDER — CEFAZOLIN SODIUM-DEXTROSE 2-4 GM/100ML-% IV SOLN
2.0000 g | Freq: Four times a day (QID) | INTRAVENOUS | Status: AC
  Administered 2021-10-23 – 2021-10-24 (×3): 2 g via INTRAVENOUS
  Filled 2021-10-23 (×3): qty 100

## 2021-10-23 MED ORDER — LEVOTHYROXINE SODIUM 50 MCG PO TABS
175.0000 ug | ORAL_TABLET | Freq: Every day | ORAL | Status: DC
  Administered 2021-10-24 – 2021-10-25 (×2): 175 ug via ORAL
  Filled 2021-10-23 (×2): qty 1

## 2021-10-23 MED ORDER — CLONAZEPAM 1 MG PO TABS
1.0000 mg | ORAL_TABLET | Freq: Two times a day (BID) | ORAL | Status: DC
  Administered 2021-10-23 – 2021-10-24 (×3): 1 mg via ORAL
  Filled 2021-10-23 (×3): qty 1

## 2021-10-23 MED ORDER — VANCOMYCIN HCL 1000 MG IV SOLR
INTRAVENOUS | Status: AC
  Filled 2021-10-23: qty 20

## 2021-10-23 MED ORDER — TRANEXAMIC ACID-NACL 1000-0.7 MG/100ML-% IV SOLN
1000.0000 mg | Freq: Once | INTRAVENOUS | Status: AC
  Administered 2021-10-23: 1000 mg via INTRAVENOUS
  Filled 2021-10-23: qty 100

## 2021-10-23 MED ORDER — MEPERIDINE HCL 50 MG/ML IJ SOLN: 6.2500 mg | INTRAMUSCULAR | Status: DC | PRN

## 2021-10-23 MED ORDER — ACETAMINOPHEN 500 MG PO TABS
500.0000 mg | ORAL_TABLET | Freq: Four times a day (QID) | ORAL | Status: AC
  Administered 2021-10-24 (×2): 500 mg via ORAL
  Filled 2021-10-23 (×2): qty 1

## 2021-10-23 MED ORDER — PHENOL 1.4 % MT LIQD: 1.0000 | OROMUCOSAL | Status: DC | PRN

## 2021-10-23 MED ORDER — 0.9 % SODIUM CHLORIDE (POUR BTL) OPTIME
TOPICAL | Status: DC | PRN
  Administered 2021-10-23: 1000 mL

## 2021-10-23 MED ORDER — CLONAZEPAM 1 MG PO TABS: 1.0000 mg | ORAL_TABLET | ORAL | Status: DC

## 2021-10-23 MED ORDER — ACETAMINOPHEN 160 MG/5ML PO SOLN: 325.0000 mg | Freq: Once | ORAL | Status: DC | PRN

## 2021-10-23 MED ORDER — PROPOFOL 10 MG/ML IV BOLUS
INTRAVENOUS | Status: DC | PRN
  Administered 2021-10-23: 140 mg via INTRAVENOUS

## 2021-10-23 MED ORDER — SIMVASTATIN 20 MG PO TABS: 20.0000 mg | ORAL_TABLET | Freq: Every day | ORAL | Status: DC

## 2021-10-23 MED ORDER — ONDANSETRON HCL 4 MG PO TABS: 4.0000 mg | ORAL_TABLET | Freq: Four times a day (QID) | ORAL | Status: DC | PRN

## 2021-10-23 MED ORDER — MENTHOL 3 MG MT LOZG: 1.0000 | LOZENGE | OROMUCOSAL | Status: DC | PRN

## 2021-10-23 MED ORDER — PHENYLEPHRINE HCL-NACL 20-0.9 MG/250ML-% IV SOLN
INTRAVENOUS | Status: AC
  Filled 2021-10-23: qty 250

## 2021-10-23 MED ORDER — FENTANYL CITRATE PF 50 MCG/ML IJ SOSY
50.0000 ug | PREFILLED_SYRINGE | INTRAMUSCULAR | Status: DC
  Administered 2021-10-23: 25 ug via INTRAVENOUS
  Filled 2021-10-23: qty 2

## 2021-10-23 MED ORDER — HYDROCODONE-ACETAMINOPHEN 5-325 MG PO TABS
1.0000 | ORAL_TABLET | ORAL | Status: DC | PRN
  Administered 2021-10-23 – 2021-10-25 (×5): 2 via ORAL
  Filled 2021-10-23 (×5): qty 2

## 2021-10-23 MED ORDER — DOCUSATE SODIUM 100 MG PO CAPS
200.0000 mg | ORAL_CAPSULE | Freq: Two times a day (BID) | ORAL | Status: DC
  Administered 2021-10-23 – 2021-10-25 (×4): 200 mg via ORAL
  Filled 2021-10-23 (×4): qty 2

## 2021-10-23 MED ORDER — BUPIVACAINE LIPOSOME 1.3 % IJ SUSP
INTRAMUSCULAR | Status: DC | PRN
  Administered 2021-10-23: 10 mL via PERINEURAL

## 2021-10-23 MED ORDER — ACETAMINOPHEN 10 MG/ML IV SOLN
INTRAVENOUS | Status: AC
  Filled 2021-10-23: qty 100

## 2021-10-23 MED ORDER — ONDANSETRON HCL 4 MG/2ML IJ SOLN
INTRAMUSCULAR | Status: DC | PRN
  Administered 2021-10-23: 4 mg via INTRAVENOUS

## 2021-10-23 MED ORDER — CHLORHEXIDINE GLUCONATE 0.12 % MT SOLN
15.0000 mL | Freq: Once | OROMUCOSAL | Status: AC
  Administered 2021-10-23: 15 mL via OROMUCOSAL

## 2021-10-23 MED ORDER — FENTANYL CITRATE PF 50 MCG/ML IJ SOSY
25.0000 ug | PREFILLED_SYRINGE | INTRAMUSCULAR | Status: DC | PRN
  Administered 2021-10-23 (×2): 50 ug via INTRAVENOUS

## 2021-10-23 MED ORDER — CLONAZEPAM 1 MG PO TABS
1.5000 mg | ORAL_TABLET | Freq: Every day | ORAL | Status: DC
  Administered 2021-10-24 – 2021-10-25 (×2): 1.5 mg via ORAL
  Filled 2021-10-23 (×2): qty 1

## 2021-10-23 MED ORDER — TIZANIDINE HCL 4 MG PO TABS: 2.0000 mg | ORAL_TABLET | Freq: Three times a day (TID) | ORAL | Status: DC | PRN

## 2021-10-23 MED ORDER — FENTANYL CITRATE (PF) 100 MCG/2ML IJ SOLN
INTRAMUSCULAR | Status: DC | PRN
  Administered 2021-10-23: 50 ug via INTRAVENOUS

## 2021-10-23 MED ORDER — PHENYLEPHRINE 80 MCG/ML (10ML) SYRINGE FOR IV PUSH (FOR BLOOD PRESSURE SUPPORT)
PREFILLED_SYRINGE | INTRAVENOUS | Status: AC
  Filled 2021-10-23: qty 20

## 2021-10-23 MED ORDER — VENLAFAXINE HCL ER 37.5 MG PO CP24: 37.5000 mg | ORAL_CAPSULE | Freq: Every day | ORAL | Status: DC

## 2021-10-23 MED ORDER — PROPOFOL 10 MG/ML IV BOLUS
INTRAVENOUS | Status: AC
  Filled 2021-10-23: qty 20

## 2021-10-23 MED ORDER — FENTANYL CITRATE (PF) 250 MCG/5ML IJ SOLN
INTRAMUSCULAR | Status: AC
  Filled 2021-10-23: qty 5

## 2021-10-23 MED ORDER — LISINOPRIL 5 MG PO TABS
5.0000 mg | ORAL_TABLET | Freq: Every day | ORAL | Status: DC
Start: 2021-10-23 — End: 2021-10-25
  Administered 2021-10-23 – 2021-10-25 (×3): 5 mg via ORAL
  Filled 2021-10-23 (×3): qty 1

## 2021-10-23 MED ORDER — LACTATED RINGERS IV SOLN: INTRAVENOUS | Status: DC | PRN

## 2021-10-23 MED ORDER — ASPIRIN 325 MG PO TBEC
325.0000 mg | DELAYED_RELEASE_TABLET | Freq: Every day | ORAL | Status: DC
  Administered 2021-10-24 – 2021-10-25 (×2): 325 mg via ORAL
  Filled 2021-10-23 (×6): qty 1

## 2021-10-23 MED ORDER — FENTANYL CITRATE PF 50 MCG/ML IJ SOSY
PREFILLED_SYRINGE | INTRAMUSCULAR | Status: AC
  Filled 2021-10-23: qty 3

## 2021-10-23 MED ORDER — HYDROCODONE-ACETAMINOPHEN 7.5-325 MG PO TABS: 1.0000 | ORAL_TABLET | ORAL | Status: DC | PRN

## 2021-10-23 MED ORDER — TRANEXAMIC ACID-NACL 1000-0.7 MG/100ML-% IV SOLN
1000.0000 mg | INTRAVENOUS | Status: AC
  Administered 2021-10-23: 1000 mg via INTRAVENOUS
  Filled 2021-10-23: qty 100

## 2021-10-23 MED ORDER — METOPROLOL SUCCINATE ER 50 MG PO TB24
100.0000 mg | ORAL_TABLET | Freq: Every day | ORAL | Status: DC
  Administered 2021-10-24 – 2021-10-25 (×2): 100 mg via ORAL
  Filled 2021-10-23 (×2): qty 2

## 2021-10-23 MED ORDER — TRAZODONE HCL 50 MG PO TABS
50.0000 mg | ORAL_TABLET | Freq: Every day | ORAL | Status: DC
  Administered 2021-10-23 – 2021-10-24 (×2): 50 mg via ORAL
  Filled 2021-10-23 (×2): qty 1

## 2021-10-23 MED ORDER — ORAL CARE MOUTH RINSE: 15.0000 mL | Freq: Once | OROMUCOSAL | Status: AC

## 2021-10-23 MED ORDER — FENOFIBRATE 160 MG PO TABS
160.0000 mg | ORAL_TABLET | Freq: Every day | ORAL | Status: DC
  Administered 2021-10-24 – 2021-10-25 (×2): 160 mg via ORAL
  Filled 2021-10-23 (×2): qty 1

## 2021-10-23 MED ORDER — BUPIVACAINE HCL (PF) 0.5 % IJ SOLN
INTRAMUSCULAR | Status: DC | PRN
  Administered 2021-10-23: 10 mL via PERINEURAL

## 2021-10-23 MED ORDER — DEXAMETHASONE SODIUM PHOSPHATE 10 MG/ML IJ SOLN
INTRAMUSCULAR | Status: DC | PRN
  Administered 2021-10-23: 10 mg via INTRAVENOUS

## 2021-10-23 MED ORDER — ONDANSETRON HCL 4 MG/2ML IJ SOLN: 4.0000 mg | Freq: Four times a day (QID) | INTRAMUSCULAR | Status: DC | PRN

## 2021-10-23 MED ORDER — GABAPENTIN 300 MG PO CAPS
300.0000 mg | ORAL_CAPSULE | Freq: Every day | ORAL | Status: DC
  Administered 2021-10-23 – 2021-10-24 (×2): 300 mg via ORAL
  Filled 2021-10-23 (×2): qty 1

## 2021-10-23 MED ORDER — BUPROPION HCL ER (SR) 100 MG PO TB12
200.0000 mg | ORAL_TABLET | Freq: Two times a day (BID) | ORAL | Status: DC
  Administered 2021-10-23 – 2021-10-25 (×4): 200 mg via ORAL
  Filled 2021-10-23 (×4): qty 2

## 2021-10-23 MED ORDER — CEFAZOLIN SODIUM-DEXTROSE 2-4 GM/100ML-% IV SOLN
2.0000 g | INTRAVENOUS | Status: AC
  Administered 2021-10-23: 2 g via INTRAVENOUS
  Filled 2021-10-23: qty 100

## 2021-10-23 MED ORDER — ACETAMINOPHEN 325 MG PO TABS: 325.0000 mg | ORAL_TABLET | Freq: Once | ORAL | Status: DC | PRN

## 2021-10-23 MED ORDER — INSULIN ASPART 100 UNIT/ML IJ SOLN
0.0000 [IU] | Freq: Three times a day (TID) | INTRAMUSCULAR | Status: DC
  Administered 2021-10-24: 3 [IU] via SUBCUTANEOUS
  Administered 2021-10-25: 2 [IU] via SUBCUTANEOUS

## 2021-10-23 MED ORDER — FERROUS SULFATE 325 (65 FE) MG PO TABS
325.0000 mg | ORAL_TABLET | Freq: Every day | ORAL | Status: DC
  Administered 2021-10-24 – 2021-10-25 (×2): 325 mg via ORAL
  Filled 2021-10-23 (×2): qty 1

## 2021-10-23 MED ORDER — PHENYLEPHRINE HCL-NACL 20-0.9 MG/250ML-% IV SOLN
INTRAVENOUS | Status: DC | PRN
  Administered 2021-10-23: 40 ug/min via INTRAVENOUS

## 2021-10-23 MED ORDER — PANTOPRAZOLE SODIUM 40 MG PO TBEC
40.0000 mg | DELAYED_RELEASE_TABLET | Freq: Every day | ORAL | Status: DC
  Administered 2021-10-24 – 2021-10-25 (×2): 40 mg via ORAL
  Filled 2021-10-23 (×2): qty 1

## 2021-10-23 MED ORDER — MORPHINE SULFATE (PF) 2 MG/ML IV SOLN
0.5000 mg | INTRAVENOUS | Status: DC | PRN
  Administered 2021-10-23: 1 mg via INTRAVENOUS
  Filled 2021-10-23: qty 1

## 2021-10-23 MED ORDER — VANCOMYCIN HCL 1 G IV SOLR
INTRAVENOUS | Status: DC | PRN
  Administered 2021-10-23: 1000 mg

## 2021-10-23 MED ORDER — ACETAMINOPHEN 10 MG/ML IV SOLN
1000.0000 mg | Freq: Once | INTRAVENOUS | Status: DC | PRN
  Administered 2021-10-23: 1000 mg via INTRAVENOUS

## 2021-10-23 MED ORDER — ACETAMINOPHEN 325 MG PO TABS: 325.0000 mg | ORAL_TABLET | Freq: Four times a day (QID) | ORAL | Status: DC | PRN

## 2021-10-23 MED ORDER — VENLAFAXINE HCL ER 75 MG PO CP24
75.0000 mg | ORAL_CAPSULE | Freq: Every day | ORAL | Status: DC
  Administered 2021-10-24 – 2021-10-25 (×2): 75 mg via ORAL
  Filled 2021-10-23 (×2): qty 1

## 2021-10-23 MED ORDER — TRIMETHOPRIM 100 MG PO TABS
100.0000 mg | ORAL_TABLET | Freq: Every day | ORAL | Status: DC
  Administered 2021-10-24 – 2021-10-25 (×2): 100 mg via ORAL
  Filled 2021-10-23 (×2): qty 1

## 2021-10-23 SURGICAL SUPPLY — 74 items
BAG COUNTER SPONGE SURGICOUNT (BAG) ×2 IMPLANT
BIT DRILL 2.7 W/STOP DISP (BIT) ×1 IMPLANT
BIT DRILL 5/64X5 DISP (BIT) ×2 IMPLANT
BIT DRILL TWIST 2.7 (BIT) ×1 IMPLANT
BLADE SAG 18X100X1.27 (BLADE) ×2 IMPLANT
CLSR STERI-STRIP ANTIMIC 1/2X4 (GAUZE/BANDAGES/DRESSINGS) ×1 IMPLANT
CNTNR URN SCR LID CUP LEK RST (MISCELLANEOUS) IMPLANT
COMP REV AUG LG W/TAPER/GLENOI (Joint) ×2 IMPLANT
COMPONENT RV AUG LG W/TAPR/GLN (Joint) IMPLANT
CONT SPEC 4OZ STRL OR WHT (MISCELLANEOUS) ×1
COVER SURGICAL LIGHT HANDLE (MISCELLANEOUS) ×2 IMPLANT
DRAPE IMP U-DRAPE 54X76 (DRAPES) ×4 IMPLANT
DRAPE INCISE IOBAN 66X45 STRL (DRAPES) ×2 IMPLANT
DRAPE ORTHO SPLIT 77X108 STRL (DRAPES) ×2
DRAPE SURG 17X23 STRL (DRAPES) ×2 IMPLANT
DRAPE SURG ORHT 6 SPLT 77X108 (DRAPES) ×2 IMPLANT
DRAPE U-SHAPE 47X51 STRL (DRAPES) ×2 IMPLANT
DRESSING AQUACEL AG SP 3.5X10 (GAUZE/BANDAGES/DRESSINGS) IMPLANT
DRESSING AQUACEL AG SP 3.5X6 (GAUZE/BANDAGES/DRESSINGS) ×1 IMPLANT
DRSG AQUACEL AG SP 3.5X10 (GAUZE/BANDAGES/DRESSINGS) ×2
DRSG AQUACEL AG SP 3.5X6 (GAUZE/BANDAGES/DRESSINGS)
DRSG TEGADERM 8X12 (GAUZE/BANDAGES/DRESSINGS) ×1 IMPLANT
DURAPREP 26ML APPLICATOR (WOUND CARE) ×2 IMPLANT
ELECT BLADE TIP CTD 4 INCH (ELECTRODE) ×2 IMPLANT
ELECT REM PT RETURN 15FT ADLT (MISCELLANEOUS) ×2 IMPLANT
GLENOID SPHERE STD STRL 36MM (Orthopedic Implant) ×1 IMPLANT
GLOVE BIO SURGEON STRL SZ7.5 (GLOVE) ×4 IMPLANT
GLOVE BIOGEL PI IND STRL 8 (GLOVE) ×2 IMPLANT
GLOVE BIOGEL PI INDICATOR 8 (GLOVE) ×2
GOWN STRL REUS W/ TWL LRG LVL3 (GOWN DISPOSABLE) ×1 IMPLANT
GOWN STRL REUS W/ TWL XL LVL3 (GOWN DISPOSABLE) ×2 IMPLANT
GOWN STRL REUS W/TWL LRG LVL3 (GOWN DISPOSABLE) ×1
GOWN STRL REUS W/TWL XL LVL3 (GOWN DISPOSABLE) ×2
KIT BASIN OR (CUSTOM PROCEDURE TRAY) ×2 IMPLANT
KIT TURNOVER KIT A (KITS) ×1 IMPLANT
MANIFOLD NEPTUNE II (INSTRUMENTS) ×2 IMPLANT
NDL 1/2 CIR CATGUT .05X1.09 (NEEDLE) ×1 IMPLANT
NDL HYPO 25X1 1.5 SAFETY (NEEDLE) ×1 IMPLANT
NEEDLE 1/2 CIR CATGUT .05X1.09 (NEEDLE) ×2 IMPLANT
NEEDLE HYPO 25X1 1.5 SAFETY (NEEDLE) ×2 IMPLANT
NS IRRIG 1000ML POUR BTL (IV SOLUTION) ×2 IMPLANT
PACK SHOULDER (CUSTOM PROCEDURE TRAY) ×2 IMPLANT
PAD ARMBOARD 7.5X6 YLW CONV (MISCELLANEOUS) ×4 IMPLANT
PIN HUMERAL STMN 3.2MMX9IN (INSTRUMENTS) ×1 IMPLANT
REAMER GUIDE BUSHING SURG DISP (MISCELLANEOUS) ×1 IMPLANT
REAMER GUIDE W/SCREW AUG (MISCELLANEOUS) ×1 IMPLANT
RESTRAINT HEAD UNIVERSAL NS (MISCELLANEOUS) ×2 IMPLANT
SCREW BONE STRL 6.5MMX25MM (Screw) ×1 IMPLANT
SCREW LOCKING 4.75MMX15MM (Screw) ×2 IMPLANT
SCREW LOCKING STRL 4.75X25X3.5 (Screw) ×2 IMPLANT
SLING ARM IMMOBILIZER LRG (SOFTGOODS) IMPLANT
SLING ARM IMMOBILIZER MED (SOFTGOODS) ×1 IMPLANT
SPONGE T-LAP 4X18 ~~LOC~~+RFID (SPONGE) ×4 IMPLANT
STRIP CLOSURE SKIN 1/2X4 (GAUZE/BANDAGES/DRESSINGS) ×2 IMPLANT
SUCTION FRAZIER HANDLE 10FR (MISCELLANEOUS) ×1
SUCTION TUBE FRAZIER 10FR DISP (MISCELLANEOUS) ×1 IMPLANT
SUT FIBERWIRE #2 38 T-5 BLUE (SUTURE) ×4
SUT MNCRL AB 3-0 PS2 18 (SUTURE) ×3 IMPLANT
SUT VIC AB 0 CT1 27 (SUTURE) ×1
SUT VIC AB 0 CT1 27XBRD ANTBC (SUTURE) IMPLANT
SUT VIC AB 0 CT1 36 (SUTURE) ×1 IMPLANT
SUT VIC AB 1 CT1 27 (SUTURE) ×1
SUT VIC AB 1 CT1 27XBRD ANBCTR (SUTURE) ×1 IMPLANT
SUT VIC AB 2-0 CT1 27 (SUTURE) ×2
SUT VIC AB 2-0 CT1 TAPERPNT 27 (SUTURE) ×2 IMPLANT
SUTURE FIBERWR #2 38 T-5 BLUE (SUTURE) ×1 IMPLANT
SWAB CULTURE ESWAB REG 1ML (MISCELLANEOUS) ×1 IMPLANT
SYR CONTROL 10ML LL (SYRINGE) ×2 IMPLANT
TOWEL OR 17X26 10 PK STRL BLUE (TOWEL DISPOSABLE) ×2 IMPLANT
TOWER CARTRIDGE SMART MIX (DISPOSABLE) IMPLANT
TRAY HUM REV SHOULDER 36 +3 (Shoulder) ×1 IMPLANT
TRAY HUM REV SHOULDER STD +6 (Shoulder) ×1 IMPLANT
WATER STERILE IRR 1000ML POUR (IV SOLUTION) ×2 IMPLANT
YANKAUER SUCT BULB TIP NO VENT (SUCTIONS) ×2 IMPLANT

## 2021-10-23 NOTE — Plan of Care (Signed)
  Problem: Nutritional: Goal: Maintenance of adequate nutrition will improve Outcome: Progressing   Problem: Nutrition: Goal: Adequate nutrition will be maintained Outcome: Progressing   Problem: Coping: Goal: Level of anxiety will decrease Outcome: Progressing   Problem: Elimination: Goal: Will not experience complications related to urinary retention Outcome: Progressing   Problem: Pain Managment: Goal: General experience of comfort will improve Outcome: Progressing

## 2021-10-23 NOTE — Anesthesia Preprocedure Evaluation (Addendum)
Anesthesia Evaluation  Patient identified by MRN, date of birth, ID band Patient awake    Reviewed: Allergy & Precautions, NPO status , Patient's Chart, lab work & pertinent test results  Airway Mallampati: II  TM Distance: >3 FB Neck ROM: Full    Dental  (+) Poor Dentition, Loose, Dental Advisory Given, Caps   Pulmonary sleep apnea , COPD,  COPD inhaler, former smoker,     + decreased breath sounds      Cardiovascular hypertension, Pt. on home beta blockers  Rhythm:Regular Rate:Normal     Neuro/Psych PSYCHIATRIC DISORDERS Anxiety Depression Bipolar Disorder TIACVA    GI/Hepatic Neg liver ROS, GERD  Medicated,  Endo/Other  diabetes, Type 2, Insulin DependentHypothyroidism   Renal/GU negative Renal ROS     Musculoskeletal   Abdominal Normal abdominal exam  (+)   Peds  Hematology   Anesthesia Other Findings   Reproductive/Obstetrics                            Anesthesia Physical Anesthesia Plan  ASA: 3  Anesthesia Plan: General   Post-op Pain Management: Regional block*   Induction: Intravenous  PONV Risk Score and Plan: 4 or greater and Ondansetron, Dexamethasone and Midazolam  Airway Management Planned: Oral ETT  Additional Equipment: None  Intra-op Plan:   Post-operative Plan: Extubation in OR  Informed Consent: I have reviewed the patients History and Physical, chart, labs and discussed the procedure including the risks, benefits and alternatives for the proposed anesthesia with the patient or authorized representative who has indicated his/her understanding and acceptance.       Plan Discussed with: CRNA  Anesthesia Plan Comments: (Risk of post PNB respiratory sequelae explained in detail. Pt aware it will resolve with the block in 24-48 hours. Pt would like to proceed with PNB. She is planning to be admitted for 2 days per surgeon. )       Anesthesia Quick  Evaluation

## 2021-10-23 NOTE — Progress Notes (Signed)
AssistedDr. Smith Robert with right, interscalene  block. Side rails up, monitors on throughout procedure. See vital signs in flow sheet. Tolerated Procedure well.

## 2021-10-23 NOTE — Anesthesia Procedure Notes (Signed)
Procedure Name: Intubation Date/Time: 10/23/2021 2:01 PM  Performed by: Gean Maidens, CRNAPre-anesthesia Checklist: Patient identified, Emergency Drugs available, Suction available, Patient being monitored and Timeout performed Patient Re-evaluated:Patient Re-evaluated prior to induction Oxygen Delivery Method: Circle system utilized Preoxygenation: Pre-oxygenation with 100% oxygen Induction Type: Combination inhalational/ intravenous induction Ventilation: Mask ventilation without difficulty Laryngoscope Size: Mac and 4 Grade View: Grade I Tube type: Oral Tube size: 7.0 mm Number of attempts: 1 Airway Equipment and Method: Stylet Placement Confirmation: ETT inserted through vocal cords under direct vision, positive ETCO2 and breath sounds checked- equal and bilateral Secured at: 21 cm Tube secured with: Tape Dental Injury: Teeth and Oropharynx as per pre-operative assessment

## 2021-10-23 NOTE — Anesthesia Postprocedure Evaluation (Signed)
Anesthesia Post Note  Patient: Angela Fernandez  Procedure(s) Performed: REVERSE SHOULDER REVISION (Right: Shoulder)     Patient location during evaluation: PACU Anesthesia Type: General Level of consciousness: awake and alert Pain management: pain level controlled Vital Signs Assessment: post-procedure vital signs reviewed and stable Respiratory status: spontaneous breathing, nonlabored ventilation, respiratory function stable and patient connected to nasal cannula oxygen Cardiovascular status: blood pressure returned to baseline and stable Postop Assessment: no apparent nausea or vomiting Anesthetic complications: no   No notable events documented.  Last Vitals:  Vitals:   10/23/21 1710 10/23/21 1715  BP:  (!) 126/55  Pulse: 75 73  Resp: 13 12  Temp:    SpO2: 95% 95%    Last Pain:  Vitals:   10/23/21 1710  TempSrc:   PainSc: [name]

## 2021-10-23 NOTE — H&P (Signed)
ORTHOPAEDIC H&P  REQUESTING PHYSICIAN: Nicholes Stairs, MD  PCP:  Leonel Ramsay, MD  Chief Complaint: Right shoulder reverse arthroplasty loosening  HPI: Angela Fernandez is a 75 y.o. female who complains of right shoulder pain and dysfunction following a elective reverse arthroplasty performed over in [place] a number of months ago.  She had early failure of the baseplate with loosening and now malposition.  Here today for revision surgery of the right shoulder.  No new complaints.  Past Medical History:  Diagnosis Date   Anemia    Arthritis    Bipolar affect, depressed ([place])    COPD (chronic obstructive pulmonary disease) ([place])    Diabetes mellitus without complication (HCC)    type II   GERD (gastroesophageal reflux disease)    Hyperlipidemia    Hypertension    Hypothyroidism    PTSD (post-traumatic stress disorder)    Schizoaffective disorder ([place])    Sleep apnea    cpap   Stroke ([place])    hx of  mini stroke - 2001   TIA (transient ischemic attack) 2010   UTI (urinary tract infection)    Past Surgical History:  Procedure Laterality Date   BILATERAL CARPAL TUNNEL RELEASE Bilateral    BREAST BIOPSY     BREAST SURGERY Right    lumpectomy   CATARACT EXTRACTION W/ INTRAOCULAR LENS  IMPLANT, BILATERAL Bilateral    CHOLECYSTECTOMY     COLONOSCOPY     ESOPHAGOGASTRODUODENOSCOPY     EYE SURGERY     JOINT REPLACEMENT     NASAL SINUS SURGERY     REPLACEMENT TOTAL KNEE BILATERAL Bilateral    REVERSE SHOULDER ARTHROPLASTY Right 09/02/2020   Procedure: REVERSE SHOULDER ARTHROPLASTY;  Surgeon: Corky Mull, MD;  Location: [facility] ORS;  Service: Orthopedics;  Laterality: Right;   SHOULDER SURGERY Right 2020   rotator cuff repair   Social History   Socioeconomic History   Marital status: Single    Spouse name: Not on file   Number of children: 1   Years of education: Not on file   Highest education level: Some college, no degree  Occupational History   Not  on file  Tobacco Use   Smoking status: Former    Years: 54.00    Types: Cigarettes    Quit date: 05/10/2016    Years since quitting: 5.4   Smokeless tobacco: Never  Vaping Use   Vaping Use: Never used  Substance and Sexual Activity   Alcohol use: Not Currently    Comment: quit in age 49's or 53's   Drug use: Yes    Comment: last used in age 37's   Sexual activity: Not Currently  Other Topics Concern   Not on file  Social History Narrative   Moved from [place]; lives with daughter   Social Determinants of Health   Financial Resource Strain: Not on file  Food Insecurity: Not on file  Transportation Needs: Not on file  Physical Activity: Not on file  Stress: Not on file  Social Connections: Not on file   Family History  Problem Relation Age of Onset   Alzheimer's disease Mother    Alcohol abuse Father    Schizophrenia Father    Pancreatitis Father    Alcohol abuse Maternal Grandfather    Alzheimer's disease Maternal Grandmother    No Known Allergies Prior to Admission medications   Medication Sig Start Date End Date Taking? Authorizing Provider  albuterol (VENTOLIN HFA) 108 (90 Base) MCG/ACT inhaler Inhale 2  puffs into the lungs every 6 (six) hours as needed for wheezing or shortness of breath. 04/28/21  Yes Juline Patch, MD  buPROPion (WELLBUTRIN SR) 200 MG 12 hr tablet Take 200 mg by mouth 2 (two) times daily.   Yes [provider]  clonazePAM (KLONOPIN) 1 MG tablet Take 1-1.5 mg by mouth See admin instructions. Take 1.5 mg in the am, 1 mg in the evening, and 1 mg at night   Yes [provider]  desvenlafaxine (PRISTIQ) 50 MG 24 hr tablet Take 1 tablet by mouth at bedtime. 08/17/21  Yes [provider]  Desvenlafaxine ER (PRISTIQ) 50 MG TB24 Take 1 tablet by mouth at bedtime. psych 08/17/21  Yes [provider]  docusate sodium (COLACE) 100 MG capsule Take 200 mg by mouth 2 (two) times daily.   Yes [provider]  Dulaglutide  (TRULICITY) 1.61 WR/6.0AV SOPN Inject 0.75 mg into the skin once a week. 09/21/21  Yes Juline Patch, MD  fenofibrate (TRICOR) 145 MG tablet Take 1 tablet (145 mg total) by mouth daily. 4pm 04/28/21  Yes Juline Patch, MD  ferrous sulfate 325 (65 FE) MG tablet Take 1 tablet (325 mg total) by mouth daily with breakfast. 08/03/21  Yes Juline Patch, MD  gabapentin (NEURONTIN) 300 MG capsule Take 300 mg by mouth at bedtime. 08/25/21  Yes [provider]  insulin glargine (LANTUS SOLOSTAR) 100 UNIT/ML Solostar Pen Inject 24 Units into the skin daily. 09/08/21  Yes [provider]  levothyroxine (SYNTHROID) 175 MCG tablet Take 1 tablet (175 mcg total) by mouth daily before breakfast. 04/28/21  Yes Juline Patch, MD  lisinopril (ZESTRIL) 5 MG tablet Take 1 tablet (5 mg total) by mouth daily. 04/28/21  Yes Juline Patch, MD  Lurasidone HCl (LATUDA) 120 MG TABS Take 120 mg by mouth daily.   Yes [provider]  meloxicam (MOBIC) 7.5 MG tablet Take 1 tablet (7.5 mg total) by mouth daily. 02/19/20  Yes Juline Patch, MD  metoprolol succinate (TOPROL-XL) 100 MG 24 hr tablet Take 1 tablet (100 mg total) by mouth daily. Take with or immediately following a meal. 04/28/21  Yes Juline Patch, MD  Multiple Vitamins-Minerals (HAIR/SKIN/NAILS) TABS Take 1 tablet by mouth daily.   Yes [provider]  Multiple Vitamins-Minerals (PRESERVISION AREDS 2) CAPS Take 1 capsule by mouth 2 (two) times daily.   Yes [provider]  omeprazole (PRILOSEC) 40 MG capsule Take 1 capsule (40 mg total) by mouth daily. 04/28/21  Yes Juline Patch, MD  ondansetron (ZOFRAN) 4 MG tablet Take 4 mg by mouth 2 (two) times daily as needed for nausea or vomiting.   Yes [provider]  OVER THE COUNTER MEDICATION Take 1 Scoop by mouth daily in the afternoon. Collagen protein powder   Yes [provider]  simvastatin (ZOCOR) 20 MG tablet Take 1 tablet (20 mg total) by mouth  daily. 04/28/21  Yes Juline Patch, MD  tiZANidine (ZANAFLEX) 4 MG tablet Take 2-4 mg by mouth every 8 (eight) hours as needed for muscle spasms.   Yes [provider]  traZODone (DESYREL) 50 MG tablet Take 50 mg by mouth at bedtime. Take one tablet at bedtime/ psych   Yes [provider]  trimethoprim (TRIMPEX) 100 MG tablet Take 1 tablet (100 mg total) by mouth daily. 10/19/21  Yes MacDiarmid, Nicki Reaper, MD  umeclidinium-vilanterol (ANORO ELLIPTA) 62.5-25 MCG/ACT AEPB Inhale 2 puffs into the lungs daily as  needed (shortness of breath).   Yes [provider]  VITAMIN E, TOPICAL, CREA Apply 1 application. topically daily.   Yes [provider]  VITAMIN E, TOPICAL, CREA Apply topically.   Yes [provider]  zolpidem (AMBIEN) 5 MG tablet Take 5 mg by mouth at bedtime.   Yes [provider]  Aloe-Sodium Chloride (AYR SALINE NASAL GEL NA) Place 1 application. into the nose every other day.    [provider]  amoxicillin (AMOXIL) 500 MG capsule Take 500-2,000 mg by mouth See admin instructions. Take 500 mg 3 times daily as needed for uti symptoms, take 2000 mg 1 hour prior to dental work    [provider]   No results found.  Positive ROS: All other systems have been reviewed and were otherwise negative with the exception of those mentioned in the HPI and as above.  Physical Exam: General: Alert, no acute distress Cardiovascular: No pedal edema Respiratory: No cyanosis, no use of accessory musculature GI: No organomegaly, abdomen is soft and non-tender Skin: No lesions in the area of chief complaint Neurologic: Sensation intact distally Psychiatric: Patient is competent for consent with normal mood and affect Lymphatic: No axillary or cervical lymphadenopathy  MUSCULOSKELETAL: Right upper extremity is warm and well-perfused.  Nicely healed deltopectoral incision.  Assessment: 1.  Right reverse arthroplasty early  failure  Plan: -Plan to proceed today with revision of the right shoulder reverse arthroplasty.  We again discussed risk and benefits of the procedure including but not limited to bleeding, infection, damage to surrounding nerves and vessels, stiffness, failure of her pain relief, recurrent loosening, as well as the risk of anesthesia.  She has provided informed consent.  -Plan for discharge home postoperatively from PACU.    Nicholes Stairs, MD Cell [phone]    10/23/2021 1:11 PM

## 2021-10-23 NOTE — Op Note (Signed)
10/23/2021  4:03 PM  PATIENT:  Angela Fernandez    PRE-OPERATIVE DIAGNOSIS:  Right reverse failure  POST-OPERATIVE DIAGNOSIS:  Same  PROCEDURE:  REVERSE SHOULDER REVISION, of the humeral tray and polyethylene liner as well as the glenosphere baseplate and glenosphere (2 components).  SURGEON:  Nicholes Stairs, MD  ASSISTANT: Jonelle Sidle, PA-C  Assistant attestation:  PA Mcclung was present for the entire procedure.  Critically port and port for all portions of the procedure.  ANESTHESIA:   General  ESTIMATED BLOOD LOSS: 500 cc  PREOPERATIVE INDICATIONS:  Angela Fernandez is a  76 y.o. female with a diagnosis of Right reverse failure who failed conservative measures and elected for surgical management.  She had an elective reverse arthroplasty performed by a colleague in [place] back in April 2022.  She had early failure of this with loosening acutely of the glenosphere baseplate following a ground-level fall.  This has resulted in chronic instability and pain and dysfunction.  The risks benefits and alternatives were discussed with the patient preoperatively including but not limited to the risks of infection, bleeding, nerve injury, cardiopulmonary complications, the need for revision surgery, dislocation, brachial plexus palsy, incomplete relief of pain, among others, and the patient was willing to proceed.  OPERATIVE IMPLANTS: Biomet size large augmented baseplate with a 25 mm central compression screw and 4 peripheral locking screws.  A 36 standard glenosphere.  On the humeral side a +6 offset humeral tray with a +3 polyethylene liner.  OPERATIVE FINDINGS:  There were no obvious signs of infection.  The subscapularis repair had been partially retained on the inferior portion.  The superior rotator cuff was completely deficient.  Teres minor was intact.  There was abundant subdeltoid and subpectoral adhesions.  The humeral stem was well fixed.  There was significant polyethylene  wear on the inferior portion from malposition of the glenosphere.  This had created metal-on-metal wear but no metallosis noted.  The glenosphere was anteverted and superior oriented by at least 30 to 40 degrees in each plane.  The glenosphere ball was not loose and the baseplate was in fact not loose either, despite the inferior locking screw being completely out of the bone.  OPERATIVE PROCEDURE: The patient was brought to the operating room and placed in the supine position. General anesthesia was administered. IV antibiotics were given. A Foley was not placed. Time out was performed. The upper extremity was prepped and draped in usual sterile fashion. The patient was in a beachchair position.  The previously utilized deltopectoral approach incision was reopened.  We dissected down through skin and subcutaneous tissue into the deltopectoral interval.  The cephalic vein had been preserved in the previous surgery and was likewise preserved during this case, mobilized carefully and retracted medially throughout the procedure.  We then entered the subdeltoid space bluntly and performed lysis of adhesion there.  We resected a large portion of hypertrophic subdeltoid scar tissue.  Likewise we followed the conjoined tendon down medially and developed the plane between the conjoined tendon and the pectoralis major.  This was quite adhered as well.  We were careful to lyse these adhesions bluntly and then using electrocautery for coagulation.  We then were able to place deep retractors.  We were able to release the subscapularis off of the humeral side.  This was partially healed.  It was quite tethered and not having much excursion at all.  We did release this back off the bone and developed the plane on the deep side  as well as superficial side adjacent to the conjoined tendon.  We then bluntly palpated down and felt the axillary nerve in continuity.  I then performed circumferential releases of the humerus, and  then dislocated the head.  At this juncture we noted that there was significant eccentric wear of the polyethylene.  This had essentially been articulating on just the inferior most aspect and posterior aspect of that polyethylene and ridge loading.  This was quite unstable as well and was able to easily be dislocated.  We used the tuning fork device to just lodged the [facility] taper on the humeral tray.  We passed this off the back table.  We then placed the humeral broach handle onto the stem and found it to be quite stable.  At this juncture we then passed off deep culture material from the humeral side and glenoid sign off the field to go to microbiology to be held for 20 days for C acnes.  Next we turned our attention to the glenosphere side.  The glenoid baseplate as well as glenosphere were obviously in a superiorly oriented and anteverted position.  This made it difficult to access the interface between the glenosphere and the baseplate.  We were carefully dissected around the native glenoid and placed deep retractors.  We then were able to access the [facility] taper and disimpacted this with a tuning fork.  At this time we noted that the inferior locking screw in the baseplate was in fact loose.  We remove this and then subsequently removed the 4 remaining screws.  The baseplate was now noted to in fact be quite well fixed to the native glenoid.  We used osteotome to work carefully around the central cage.  This freed up the baseplate we are able to remove this.  At that juncture we noted significant anterior superior bone loss.  The glenoid was then prepared for the new glenoid baseplate.  We elected to use a large augmented baseplate oriented at about the 1 o'clock position to fill the defect.  We then reamed sequentially up to this level and placed a baseplate with a 25 mm central screw with excellent compression and 4 peripheral locking screws.  Next, we placed the glenosphere onto the baseplate.  We used  36+0 offset placed in the B position with the centricity actually placed at the 12 o'clock position to account for the now inferiorly oriented glenosphere baseplate to accommodate for superior migration of the humerus over the last 1 year.   We then went back and trialed our humeral tray and polyethylene's.  We went with the +6 offset humeral tray and found the +3 polyethylene had excellent fit with 2 finger tightness and range of motion without instability.  The wound was then copiously irrigated.  1 g of vancomycin powder placed into the deep space.  I then irrigated the shoulder copiously once more, repaired the deltopectoral interval with #1 Vicryl followed by subcutaneous Vicryl, then monocryl for the skin,  with Steri-Strips and sterile gauze for the skin. The patient was awakened and returned back in stable and satisfactory condition. There no complications and they tolerated the procedure well.  All counts were correct x2.   Disposition:  Ms. Lamphear will be admitted to the orthopedic floor for close observation and hemoglobin and hematocrit to be checked in the a.m. as well as renal function to ensure that she is appropriately resuscitated from the surgery.  She will be on the conservative postoperative plan with sling to  be worn at all times other than when doing activities of daily living and working with therapy.  Likely discharge home Saturday versus Sunday.

## 2021-10-23 NOTE — Discharge Instructions (Signed)
Orthopedic surgery discharge instructions:  -Maintain postoperative bandage until follow-up appointment.  This is waterproof, and you may begin showering on postoperative day #3.  Do not submerge underwater.  Maintain that bandage until your follow-up appointment in 2 weeks.  -No lifting over 2 pounds with operateive arm.  You may use the arm immediately for activities of daily living such as bathing, washing your face and brushing your teeth, eating, and getting dressed.  Otherwise maintain your sling when you are out of the house and sleeping.  -Apply ice liberally to the shoulder throughout the day.  For mild to moderate pain use Tylenol and Advil as needed around-the-clock.  For breakthrough pain use oxycodone as necessary.  -You will return to see Dr. Caylin Raby in the office in 2 weeks for routine postoperative check with x-rays.  

## 2021-10-23 NOTE — Brief Op Note (Signed)
10/23/2021  3:56 PM  PATIENT:  Angela Fernandez  76 y.o. female  PRE-OPERATIVE DIAGNOSIS:  Right reverse failure  POST-OPERATIVE DIAGNOSIS:  Right reverse failure  PROCEDURE:  Procedure(s) with comments: REVERSE SHOULDER REVISION , two components (Right) - 180  SURGEON:  Surgeon(s) and Role:    * Stann Mainland, Elly Modena, MD - Primary  PHYSICIAN ASSISTANT: Jonelle Sidle, PA-C  ANESTHESIA:   regional and general  EBL:  500 mL   BLOOD ADMINISTERED:none  DRAINS: none   LOCAL MEDICATIONS USED:  NONE  SPECIMEN: Intraoperative cultures on the humeral and glenoid side  DISPOSITION OF SPECIMEN: Cultures to microbiology to be held for 20 days  COUNTS:  YES  TOURNIQUET:  * No tourniquets in log *  DICTATION: .Note written in EPIC  PLAN OF CARE: Admit to inpatient   PATIENT DISPOSITION:  PACU - hemodynamically stable.   Delay start of Pharmacological VTE agent (>24hrs) due to surgical blood loss or risk of bleeding: not applicable

## 2021-10-23 NOTE — Transfer of Care (Signed)
Immediate Anesthesia Transfer of Care Note  Patient: Angela Fernandez  Procedure(s) Performed: REVERSE SHOULDER REVISION (Right: Shoulder)  Patient Location: PACU  Anesthesia Type:General  Level of Consciousness: sedated, patient cooperative and responds to stimulation  Airway & Oxygen Therapy: Patient Spontanous Breathing and Patient connected to face mask oxygen  Post-op Assessment: Report given to RN and Post -op Vital signs reviewed and stable  Post vital signs: Reviewed and stable  Last Vitals:  Vitals Value Taken Time  BP 165/90 10/23/21 1636  Temp 36.3 C 10/23/21 1636  Pulse 81 10/23/21 1639  Resp 22 10/23/21 1639  SpO2 97 % 10/23/21 1639  Vitals shown include unvalidated device data.  Last Pain:  Vitals:   10/23/21 1211  TempSrc:   PainSc: 2       Patients Stated Pain Goal: 2 (03/11/10 1735)  Complications: No notable events documented.

## 2021-10-23 NOTE — Anesthesia Procedure Notes (Signed)
Anesthesia Regional Block: Interscalene brachial plexus block   Pre-Anesthetic Checklist: , timeout performed,  Correct Patient, Correct Site, Correct Laterality,  Correct Procedure, Correct Position, site marked,  Risks and benefits discussed,  Surgical consent,  Pre-op evaluation,  At surgeon's request and post-op pain management  Laterality: Right  Prep: chloraprep       Needles:  Injection technique: Single-shot  Needle Type: Echogenic Stimulator Needle     Needle Length: 9cm  Needle Gauge: 21     Additional Needles:   Procedures:,,,, ultrasound used (permanent image in chart),,    Narrative:  Start time: 10/23/2021 12:16 PM End time: 10/23/2021 12:21 PM Injection made incrementally with aspirations every 5 mL.  Performed by: Personally  Anesthesiologist: Effie Berkshire, MD  Additional Notes: Patient tolerated the procedure well. Local anesthetic introduced in an incremental fashion under minimal resistance after negative aspirations. No paresthesias were elicited. After completion of the procedure, no acute issues were identified and patient continued to be monitored by RN.

## 2021-10-24 LAB — BASIC METABOLIC PANEL WITH GFR
Anion gap: 5 (ref 5–15)
BUN: 23 mg/dL (ref 8–23)
CO2: 26 mmol/L (ref 22–32)
Calcium: 8.7 mg/dL — ABNORMAL LOW (ref 8.9–10.3)
Chloride: 107 mmol/L (ref 98–111)
Creatinine, Ser: 0.9 mg/dL (ref 0.44–1.00)
GFR, Estimated: >60 mL/min
Glucose, Bld: 158 mg/dL — ABNORMAL HIGH (ref 70–99)
Potassium: 3.6 mmol/L (ref 3.5–5.1)
Sodium: 138 mmol/L (ref 135–145)

## 2021-10-24 LAB — GLUCOSE, CAPILLARY
Glucose-Capillary: 78 mg/dL (ref 70–99)
Glucose-Capillary: 97 mg/dL (ref 70–99)
Glucose-Capillary: 176 mg/dL — ABNORMAL HIGH (ref 70–99)
Glucose-Capillary: 135 mg/dL — ABNORMAL HIGH (ref 70–99)

## 2021-10-24 LAB — HEMOGLOBIN AND HEMATOCRIT, BLOOD
HCT: 30.8 % — ABNORMAL LOW (ref 36.0–46.0)
Hemoglobin: 9.7 g/dL — ABNORMAL LOW (ref 12.0–15.0)

## 2021-10-24 MED ORDER — OXYCODONE-ACETAMINOPHEN 7.5-325 MG PO TABS: 1.0000 | ORAL_TABLET | Freq: Four times a day (QID) | ORAL | 0 refills | Status: DC | PRN

## 2021-10-24 NOTE — Evaluation (Signed)
Occupational Therapy Evaluation Patient Details Name: Angela Fernandez MRN: [id number] DOB: 03/15/1946 Today's Date: 10/24/2021   History of Present Illness Patient s/p right REVERSE SHOULDER REVISION   Clinical Impression   Angela Fernandez is a 76 year old woman s/p shoulder replacement without functional use of right dominant upper extremity secondary to effects of surgery and interscalene block and shoulder precautions. Therapist provided education and instruction to patient and daughter in regards to exercises, precautions, positioning, donning upper extremity clothing and bathing while maintaining shoulder precautions, ice and edema management and donning/doffing sling. Patient and daughter verbalized understanding and handouts provided to maximize retention of education. Patient needed assistance to donn shirt and sling. Will follow acutely to maximize safety and maintain precautions. Patient to follow up with MD for further therapy needs at discharge.        Recommendations for follow up therapy are one component of a multi-disciplinary discharge planning process, led by the attending physician.  Recommendations may be updated based on patient status, additional functional criteria and insurance authorization.   Follow Up Recommendations  Follow physician's recommendations for discharge plan and follow up therapies    Assistance Recommended at Discharge Intermittent Supervision/Assistance  Patient can return home with the following A little help with bathing/dressing/bathroom;Assistance with cooking/housework;Help with stairs or ramp for entrance;Assist for transportation    Functional Status Assessment  Patient has had a recent decline in their functional status and demonstrates the ability to make significant improvements in function in a reasonable and predictable amount of time.  Equipment Recommendations  None recommended by OT    Recommendations for Other Services        Precautions / Restrictions Precautions Precautions: Shoulder Type of Shoulder Precautions: AROM/PROM 0-90 deg FF, 0-30 degrees ER, no abduction, okay for elbow, wrist and hand Shoulder Interventions: Shoulder sling/immobilizer;Off for dressing/bathing/exercises Precaution Booklet Issued:  (handouts) Required Braces or Orthoses: Sling Restrictions Weight Bearing Restrictions: Yes RUE Weight Bearing: Non weight bearing      Mobility Bed Mobility Overal bed mobility: Needs Assistance Bed Mobility: Supine to Sit     Supine to sit: Supervision          Transfers Overall transfer level: Needs assistance Equipment used: 1 person hand held assist                      Balance Overall balance assessment: History of Falls                                         ADL either performed or assessed with clinical judgement   ADL Overall ADL's : Needs assistance/impaired Eating/Feeding: Independent   Grooming: Set up;Sitting   Upper Body Bathing: Moderate assistance;Sitting   Lower Body Bathing: Minimal assistance;Sit to/from stand   Upper Body Dressing : Moderate assistance;Sitting   Lower Body Dressing: Moderate assistance;Sit to/from stand   Toilet Transfer: Min guard;Ambulation;Regular Museum/gallery exhibitions officer and Hygiene: Min guard;Sit to/from stand       Functional mobility during ADLs: Min guard       Vision Patient Visual Report: No change from baseline       Perception     Praxis      Pertinent Vitals/Pain Pain Assessment Pain Assessment: No/denies pain (interscalene block)     Hand Dominance Right   Extremity/Trunk Assessment Upper Extremity Assessment Upper Extremity Assessment: RUE deficits/detail RUE Deficits /  Details: impaired sensation and motor control secondary to block   Lower Extremity Assessment Lower Extremity Assessment: Overall WFL for tasks assessed   Cervical / Trunk  Assessment Cervical / Trunk Assessment: Normal   Communication Communication Communication: No difficulties   Cognition Arousal/Alertness: Awake/alert Behavior During Therapy: WFL for tasks assessed/performed Overall Cognitive Status: Within Functional Limits for tasks assessed                                       General Comments       Exercises     Shoulder Instructions Shoulder Instructions Donning/doffing shirt without moving shoulder: Caregiver independent with task Method for sponge bathing under operated UE: Caregiver independent with task Donning/doffing sling/immobilizer: Caregiver independent with task Correct positioning of sling/immobilizer: Caregiver independent with task ROM for elbow, wrist and digits of operated UE: Caregiver independent with task Sling wearing schedule (on at all times/off for ADL's): Caregiver independent with task Proper positioning of operated UE when showering: Caregiver independent with task Dressing change: Caregiver independent with task Positioning of UE while sleeping: Caregiver independent with task    Home Living Family/patient expects to be discharged to:: Private residence Living Arrangements: Children;Other relatives Available Help at Discharge: Family;Available 24 hours/day (for the first week) Type of Home: House                                  Prior Functioning/Environment Prior Level of Function : Independent/Modified Independent                        OT Problem List: Decreased strength;Decreased range of motion;Impaired UE functional use;Pain      OT Treatment/Interventions: Self-care/ADL training;Therapeutic exercise;DME and/or AE instruction;Patient/family education    OT Goals(Current goals can be found in the care plan section) Acute Rehab OT Goals Patient Stated Goal: be safe OT Goal Formulation: With patient/family Time For Goal Achievement: 11/07/21 Potential to  Achieve Goals: Good  OT Frequency: Min 2X/week    Co-evaluation              AM-PAC OT "6 Clicks" Daily Activity     Outcome Measure Help from another person eating meals?: None Help from another person taking care of personal grooming?: A Little Help from another person toileting, which includes using toliet, bedpan, or urinal?: A Little Help from another person bathing (including washing, rinsing, drying)?: A Little Help from another person to put on and taking off regular upper body clothing?: A Lot Help from another person to put on and taking off regular lower body clothing?: A Lot 6 Click Score: 17   End of Session Nurse Communication: Mobility status  Activity Tolerance: Patient tolerated treatment well Patient left: in chair;with call bell/phone within reach;with family/visitor present;with chair alarm set  OT Visit Diagnosis: Muscle weakness (generalized) (M62.81)                Time: 3419-3790 OT Time Calculation (min): 33 min Charges:  OT General Charges $OT Visit: 1 Visit OT Evaluation $OT Eval Low Complexity: 1 Low OT Treatments $Self Care/Home Management : 8-22 mins  Sufyaan Palma, OTR/L [place]  Office [phone] Pager: [phone]   Lenward Chancellor 10/24/2021, 4:06 PM

## 2021-10-24 NOTE — Plan of Care (Signed)

## 2021-10-24 NOTE — TOC CM/SW Note (Signed)
  Transition of Care [facility]) Screening Note   Patient Details  Name: Angela Fernandez Date of Birth: [birth year]   [facility] of [facility]) CM/SW Contact:    Ross Ludwig, LCSW Phone Number: 10/24/2021, 10:53 AM    Transition of Care Department [facility]) has reviewed patient and no TOC needs have been identified at this time. We will continue to monitor patient advancement through interdisciplinary progression rounds. If new patient transition needs arise, please place a TOC consult.

## 2021-10-24 NOTE — Progress Notes (Signed)
Orthopedics Progress Note  Subjective: Pain controlled this AM  Objective:  Vitals:   10/24/21 0551 10/24/21 0930  BP: (!) 113/57 125/63  Pulse: 66 67  Resp: 20 15  Temp: 98.9 F (37.2 C) 97.6 F (36.4 C)  SpO2: 99% 100%    General: Awake and alert  Musculoskeletal: Right shoulder dressing CDI. Wiggles fingers Neurovascularly intact  Lab Results  Component Value Date   WBC 10.4 10/12/2021   HGB 9.7 (L) 10/24/2021   HCT 30.8 (L) 10/24/2021   MCV 98.7 10/12/2021   PLT 304 10/12/2021       Component Value Date/Time   NA 138 10/24/2021 0314   NA 140 02/03/2021 0000   K 3.6 10/24/2021 0314   CL 107 10/24/2021 0314   CO2 26 10/24/2021 0314   GLUCOSE 158 (H) 10/24/2021 0314   BUN 23 10/24/2021 0314   BUN 33 (A) 02/03/2021 0000   CREATININE 0.90 10/24/2021 0314   CALCIUM 8.7 (L) 10/24/2021 0314   GFRNONAA >60 10/24/2021 0314   GFRAA 64 04/28/2020 1100    No results found for: "INR", "PROTIME"  Assessment/Plan: POD #1 s/p Procedure(s): REVERSE SHOULDER REVISION Stable this morning. Plan to discharge to home later today.  Follow up with Stann Mainland in two weeks in the office.   Doran Heater. Veverly Fells, MD 10/24/2021 9:51 AM

## 2021-10-24 NOTE — Plan of Care (Signed)
  Problem: Nutritional: Goal: Maintenance of adequate nutrition will improve Outcome: Progressing   Problem: Coping: Goal: Level of anxiety will decrease Outcome: Progressing   Problem: Elimination: Goal: Will not experience complications related to bowel motility Outcome: Progressing Goal: Will not experience complications related to urinary retention Outcome: Progressing   Problem: Elimination: Goal: Will not experience complications related to urinary retention Outcome: Progressing   Problem: Pain Managment: Goal: General experience of comfort will improve Outcome: Progressing

## 2021-10-24 NOTE — Discharge Summary (Signed)
In most cases prophylactic antibiotics for Dental procdeures after total joint surgery are not necessary.  Exceptions are as follows:  1. History of prior total joint infection  2. Severely immunocompromised (Organ Transplant, cancer chemotherapy, Rheumatoid biologic meds such as [place])  3. Poorly controlled diabetes (A1C &gt; 8.0, blood glucose over 200)  If you have one of these conditions, contact your surgeon for an antibiotic prescription, prior to your dental procedure. Orthopedic Discharge Summary        Physician Discharge Summary  Patient ID: Angela Fernandez MRN: [id number] DOB/AGE: February 08, 1946 76 y.o.  Admit date: 10/23/2021 Discharge date: 10/24/2021   Procedures:  Procedure(s) (LRB): REVERSE SHOULDER REVISION (Right)  Attending Physician:  Dr. Victorino December  Admission Diagnoses:   right shoulder pain  Discharge Diagnoses:  same   Past Medical History:  Diagnosis Date   Anemia    Arthritis    Bipolar affect, depressed ([place])    COPD (chronic obstructive pulmonary disease) ([place])    Diabetes mellitus without complication ([place])    type II   GERD (gastroesophageal reflux disease)    Hyperlipidemia    Hypertension    Hypothyroidism    PTSD (post-traumatic stress disorder)    Schizoaffective disorder ([place])    Sleep apnea    cpap   Stroke ([place])    hx of  mini stroke - 2001   TIA (transient ischemic attack) 2010   UTI (urinary tract infection)     PCP: Leonel Ramsay, MD   Discharged Condition: good  Hospital Course:  Patient underwent the above stated procedure on 10/23/2021. Patient tolerated the procedure well and brought to the recovery room in good condition and subsequently to the floor. Patient had an uncomplicated hospital course and was stable for discharge.   Disposition: Discharge disposition: 01-Home or Self Care      with follow up in 2 weeks    Follow-up Information     Nicholes Stairs, MD Follow up in 2 week(s).    Specialty: Orthopedic Surgery Contact information: [address] [place] [address] [phone]                 Dental Antibiotics:  In most cases prophylactic antibiotics for Dental procdeures after total joint surgery are not necessary.  Exceptions are as follows:  1. History of prior total joint infection  2. Severely immunocompromised (Organ Transplant, cancer chemotherapy, Rheumatoid biologic meds such as [place])  3. Poorly controlled diabetes (A1C &gt; 8.0, blood glucose over 200)  If you have one of these conditions, contact your surgeon for an antibiotic prescription, prior to your dental procedure.  Discharge Instructions     Call MD / Call 911   Complete by: As directed    If you experience chest pain or shortness of breath, CALL 911 and be transported to the hospital emergency room.  If you develope a fever above 101 F, pus (white drainage) or increased drainage or redness at the wound, or calf pain, call your surgeon's office.   Constipation Prevention   Complete by: As directed    Drink plenty of fluids.  Prune juice may be helpful.  You may use a stool softener, such as Colace (over the counter) 100 mg twice a day.  Use MiraLax (over the counter) for constipation as needed.   Diet - low sodium heart healthy   Complete by: As directed    Increase activity slowly as tolerated   Complete by: As directed  Post-operative opioid taper instructions:   Complete by: As directed    POST-OPERATIVE OPIOID TAPER INSTRUCTIONS: It is important to wean off of your opioid medication as soon as possible. If you do not need pain medication after your surgery it is ok to stop day one. Opioids include: Codeine, Hydrocodone(Norco, Vicodin), Oxycodone(Percocet, oxycontin) and hydromorphone amongst others.  Long term and even short term use of opiods can cause: Increased pain response Dependence Constipation Depression Respiratory depression And  more.  Withdrawal symptoms can include Flu like symptoms Nausea, vomiting And more Techniques to manage these symptoms Hydrate well Eat regular healthy meals Stay active Use relaxation techniques(deep breathing, meditating, yoga) Do Not substitute Alcohol to help with tapering If you have been on opioids for less than two weeks and do not have pain than it is ok to stop all together.  Plan to wean off of opioids This plan should start within one week post op of your joint replacement. Maintain the same interval or time between taking each dose and first decrease the dose.  Cut the total daily intake of opioids by one tablet each day Next start to increase the time between doses. The last dose that should be eliminated is the evening dose.          Allergies as of 10/24/2021   No Known Allergies      Medication List     TAKE these medications    albuterol 108 (90 Base) MCG/ACT inhaler Commonly known as: VENTOLIN HFA Inhale 2 puffs into the lungs every 6 (six) hours as needed for wheezing or shortness of breath.   amoxicillin 500 MG capsule Commonly known as: AMOXIL Take 500-2,000 mg by mouth See admin instructions. Take 500 mg 3 times daily as needed for uti symptoms, take 2000 mg 1 hour prior to dental work   Anoro Ellipta 62.5-25 MCG/ACT Aepb Generic drug: umeclidinium-vilanterol Inhale 2 puffs into the lungs daily as needed (shortness of breath).   AYR SALINE NASAL GEL NA Place 1 application. into the nose every other day.   buPROPion 200 MG 12 hr tablet Commonly known as: WELLBUTRIN SR Take 200 mg by mouth 2 (two) times daily.   clonazePAM 1 MG tablet Commonly known as: KLONOPIN Take 1-1.5 mg by mouth See admin instructions. Take 1.5 mg in the am, 1 mg in the evening, and 1 mg at night   desvenlafaxine 50 MG 24 hr tablet Commonly known as: PRISTIQ Take 1 tablet by mouth at bedtime.   Desvenlafaxine ER 50 MG Tb24 Commonly known as: PRISTIQ Take 1 tablet  by mouth at bedtime. psych   docusate sodium 100 MG capsule Commonly known as: COLACE Take 200 mg by mouth 2 (two) times daily.   fenofibrate 145 MG tablet Commonly known as: TRICOR Take 1 tablet (145 mg total) by mouth daily. 4pm   ferrous sulfate 325 (65 FE) MG tablet Take 1 tablet (325 mg total) by mouth daily with breakfast.   gabapentin 300 MG capsule Commonly known as: NEURONTIN Take 300 mg by mouth at bedtime.   Lantus SoloStar 100 UNIT/ML Solostar Pen Generic drug: insulin glargine Inject 24 Units into the skin daily.   Latuda 120 MG Tabs Generic drug: Lurasidone HCl Take 120 mg by mouth daily.   levothyroxine 175 MCG tablet Commonly known as: SYNTHROID Take 1 tablet (175 mcg total) by mouth daily before breakfast.   lisinopril 5 MG tablet Commonly known as: ZESTRIL Take 1 tablet (5 mg total) by mouth daily.  meloxicam 7.5 MG tablet Commonly known as: MOBIC Take 1 tablet (7.5 mg total) by mouth daily.   metoprolol succinate 100 MG 24 hr tablet Commonly known as: TOPROL-XL Take 1 tablet (100 mg total) by mouth daily. Take with or immediately following a meal.   omeprazole 40 MG capsule Commonly known as: PRILOSEC Take 1 capsule (40 mg total) by mouth daily.   ondansetron 4 MG tablet Commonly known as: ZOFRAN Take 4 mg by mouth 2 (two) times daily as needed for nausea or vomiting.   OVER THE COUNTER MEDICATION Take 1 Scoop by mouth daily in the afternoon. Collagen protein powder   oxyCODONE-acetaminophen 7.5-325 MG tablet Commonly known as: Percocet Take 1 tablet by mouth every 6 (six) hours as needed for severe pain.   PreserVision AREDS 2 Caps Take 1 capsule by mouth 2 (two) times daily.   Hair/Skin/Nails Tabs Take 1 tablet by mouth daily.   simvastatin 20 MG tablet Commonly known as: ZOCOR Take 1 tablet (20 mg total) by mouth daily.   tiZANidine 4 MG tablet Commonly known as: ZANAFLEX Take 2-4 mg by mouth every 8 (eight) hours as needed  for muscle spasms.   traZODone 50 MG tablet Commonly known as: DESYREL Take 50 mg by mouth at bedtime. Take one tablet at bedtime/ psych   trimethoprim 100 MG tablet Commonly known as: TRIMPEX Take 1 tablet (100 mg total) by mouth daily.   Trulicity 0.48 GQ/9.1QX Sopn Generic drug: Dulaglutide Inject 0.75 mg into the skin once a week.   VITAMIN E (TOPICAL) Crea Apply 1 application. topically daily.   VITAMIN E (TOPICAL) Crea Apply topically.   zolpidem 5 MG tablet Commonly known as: AMBIEN Take 5 mg by mouth at bedtime.          Signed: Augustin Schooling 10/24/2021, 9:54 AM  [facility] is now [employer] [address], [place], [place], [place] [address] Phone: [place]

## 2021-10-25 LAB — HEMOGLOBIN AND HEMATOCRIT, BLOOD
HCT: 30.3 % — ABNORMAL LOW (ref 36.0–46.0)
Hemoglobin: 10.1 g/dL — ABNORMAL LOW (ref 12.0–15.0)

## 2021-10-25 LAB — GLUCOSE, CAPILLARY: Glucose-Capillary: 123 mg/dL — ABNORMAL HIGH (ref 70–99)

## 2021-10-25 NOTE — Progress Notes (Signed)
    Subjective:  Patient reports pain as mild to moderate.  Denies N/V/CP/SOB. Wants to go home.  Objective:   VITALS:   Vitals:   10/24/21 1445 10/24/21 1845 10/24/21 2145 10/25/21 0517  BP: 122/82 134/62 139/60 (!) 155/69  Pulse: 76 82 88 88  Resp: '17 18 16 18  '$ Temp: 98.3 F (36.8 C) 98.6 F (37 C) 98.5 F (36.9 C) 98.5 F (36.9 C)  TempSrc:  Oral Oral Oral  SpO2: 94% 96% 93% 91%  Weight:      Height:        NAD ABD soft Sling intact. Dressing c/d/I. (+) AIN/PIN/U. SILT Ax/MC/R/U/M. 2+ radial.  Lab Results  Component Value Date   WBC 10.4 10/12/2021   HGB 10.1 (L) 10/25/2021   HCT 30.3 (L) 10/25/2021   MCV 98.7 10/12/2021   PLT 304 10/12/2021   BMET    Component Value Date/Time   NA 138 10/24/2021 0314   NA 140 02/03/2021 0000   K 3.6 10/24/2021 0314   CL 107 10/24/2021 0314   CO2 26 10/24/2021 0314   GLUCOSE 158 (H) 10/24/2021 0314   BUN 23 10/24/2021 0314   BUN 33 (A) 02/03/2021 0000   CREATININE 0.90 10/24/2021 0314   CALCIUM 8.7 (L) 10/24/2021 0314   GFRNONAA >60 10/24/2021 0314     Assessment/Plan: 2 Days Post-Op   Principal Problem:   S/P reverse total shoulder arthroplasty, right  Sling, NWB RUE DVT ppx: Aspirin, ambulation PO pain control PT/OT Dispo: D/C home today   Angela Fernandez Angela Fernandez 10/25/2021, 9:06 AM   Rod Can, MD [phone] [place] is now [facility]  [place] [address] [place], [place], [place] [address] Phone: [phone] [email or web address] Facebook  [employer]

## 2021-10-25 NOTE — Progress Notes (Signed)
Occupational Therapy Treatment Patient Details Name: Angela Fernandez MRN: [id number] DOB: 1946-05-10 Today's Date: 10/25/2021   History of present illness Patient s/p right REVERSE SHOULDER REVISION   OT comments  Treatment focused on reiterated of shoulder instructions including use of sling, UE positioning, shoulder precautions and exercises. Patient's daughter was provided with education and handouts given yesterday. Patient able to remember FF and external rotation exercises and needed active assist to perform. Improved function of lower arm today as block wears off but no complaints of pain.From an OT standpoint can discharge home with family.    Recommendations for follow up therapy are one component of a multi-disciplinary discharge planning process, led by the attending physician.  Recommendations may be updated based on patient status, additional functional criteria and insurance authorization.    Follow Up Recommendations  Follow physician's recommendations for discharge plan and follow up therapies    Assistance Recommended at Discharge Intermittent Supervision/Assistance  Patient can return home with the following  A little help with bathing/dressing/bathroom;Assistance with cooking/housework;Help with stairs or ramp for entrance;Assist for transportation   Equipment Recommendations  None recommended by OT    Recommendations for Other Services      Precautions / Restrictions Precautions Precautions: Shoulder Type of Shoulder Precautions: AROM/PROM 0-90 deg FF, 0-30 degrees ER, no abduction, okay for elbow, wrist and hand Shoulder Interventions: Shoulder sling/immobilizer;Off for dressing/bathing/exercises Precaution Booklet Issued:  (hanouts) Required Braces or Orthoses: Sling Restrictions Weight Bearing Restrictions: Yes RUE Weight Bearing: Non weight bearing       Mobility Bed Mobility                    Transfers                          Balance                                           ADL either performed or assessed with clinical judgement   ADL                                              Extremity/Trunk Assessment Upper Extremity Assessment RUE Deficits / Details: improving functional use of lower arm       Cervical / Trunk Assessment Cervical / Trunk Assessment: Normal    Vision Patient Visual Report: No change from baseline     Perception     Praxis      Cognition Arousal/Alertness: Awake/alert Behavior During Therapy: WFL for tasks assessed/performed Overall Cognitive Status: Within Functional Limits for tasks assessed                                          Exercises      Shoulder Instructions Shoulder Instructions Donning/doffing sling/immobilizer: Caregiver independent with task Correct positioning of sling/immobilizer: Caregiver independent with task (reiterated with patinet) ROM for elbow, wrist and digits of operated UE: Caregiver independent with task Sling wearing schedule (on at all times/off for ADL's): Caregiver independent with task (reiterated with patient) Dressing change: Caregiver independent with task (reiterated with patinet) Positioning of UE while sleeping: Caregiver independent with task (  reiterated with patient)     General Comments      Pertinent Vitals/ Pain       Pain Assessment Pain Assessment: No/denies pain  Home Living                                          Prior Functioning/Environment              Frequency           Progress Toward Goals  OT Goals(current goals can now be found in the care plan section)  Progress towards OT goals: Progressing toward goals  Acute Rehab OT Goals Patient Stated Goal: recover shoulder well OT Goal Formulation: With patient/family Time For Goal Achievement: 11/07/21 Potential to Achieve Goals: Good  Plan Discharge plan remains  appropriate    Co-evaluation                 AM-PAC OT "6 Clicks" Daily Activity     Outcome Measure   Help from another person eating meals?: None Help from another person taking care of personal grooming?: A Little Help from another person toileting, which includes using toliet, bedpan, or urinal?: A Little Help from another person bathing (including washing, rinsing, drying)?: A Little Help from another person to put on and taking off regular upper body clothing?: A Lot Help from another person to put on and taking off regular lower body clothing?: A Lot 6 Click Score: 17    End of Session    OT Visit Diagnosis: Muscle weakness (generalized) (M62.81)   Activity Tolerance Patient tolerated treatment well   Patient Left in chair;with call bell/phone within reach;with chair alarm set   Nurse Communication  (okay to discharge from OT perspective)        Time: 6010-9323 OT Time Calculation (min): 13 min  Charges: OT General Charges $OT Visit: 1 Visit OT Treatments $Self Care/Home Management : 8-22 mins  Derl Barrow, OTR/L [place]  Office [phone] Pager: [place] 10/25/2021, 8:36 AM

## 2021-10-25 NOTE — Plan of Care (Signed)
Discharge instructions given to the patient and daughter including medications.

## 2021-10-26 ENCOUNTER — Encounter (HOSPITAL_COMMUNITY): Payer: Self-pay | Admitting: Orthopedic Surgery

## 2021-10-28 LAB — AEROBIC/ANAEROBIC CULTURE W GRAM STAIN (SURGICAL/DEEP WOUND)
Culture: NO GROWTH
Gram Stain: NONE SEEN

## 2021-10-29 ENCOUNTER — Other Ambulatory Visit: Payer: Self-pay | Admitting: Family Medicine

## 2021-10-29 DIAGNOSIS — D509 Iron deficiency anemia, unspecified: Secondary | ICD-10-CM

## 2021-10-30 ENCOUNTER — Ambulatory Visit: Payer: Medicare Other | Admitting: Family Medicine

## 2021-11-01 ENCOUNTER — Encounter: Payer: Self-pay | Admitting: Urology

## 2021-11-02 ENCOUNTER — Telehealth: Payer: Self-pay

## 2021-11-02 ENCOUNTER — Ambulatory Visit (INDEPENDENT_AMBULATORY_CARE_PROVIDER_SITE_OTHER): Payer: Medicare Other | Admitting: Physician Assistant

## 2021-11-02 VITALS — BP 121/76 | HR 74 | Ht 64.0 in | Wt 187.0 lb

## 2021-11-02 DIAGNOSIS — R8281 Pyuria: Secondary | ICD-10-CM

## 2021-11-02 DIAGNOSIS — R300 Dysuria: Secondary | ICD-10-CM | POA: Diagnosis not present

## 2021-11-02 DIAGNOSIS — R350 Frequency of micturition: Secondary | ICD-10-CM

## 2021-11-02 DIAGNOSIS — N3020 Other chronic cystitis without hematuria: Secondary | ICD-10-CM | POA: Diagnosis not present

## 2021-11-02 DIAGNOSIS — R3915 Urgency of urination: Secondary | ICD-10-CM

## 2021-11-02 DIAGNOSIS — N390 Urinary tract infection, site not specified: Secondary | ICD-10-CM

## 2021-11-02 LAB — MICROSCOPIC EXAMINATION

## 2021-11-02 LAB — URINALYSIS, COMPLETE
Bilirubin, UA: NEGATIVE
Nitrite, UA: NEGATIVE
Protein,UA: NEGATIVE
RBC, UA: NEGATIVE
Specific Gravity, UA: 1.02 (ref 1.005–1.030)
Urobilinogen, Ur: 1 mg/dL (ref 0.2–1.0)
pH, UA: 6 (ref 5.0–7.5)

## 2021-11-02 MED ORDER — AMOXICILLIN 500 MG PO CAPS: 500.0000 mg | ORAL_CAPSULE | Freq: Two times a day (BID) | ORAL | 0 refills | Status: AC

## 2021-11-02 NOTE — Telephone Encounter (Signed)
Called patient

## 2021-11-04 LAB — CULTURE, URINE COMPREHENSIVE

## 2021-11-06 DIAGNOSIS — Z4789 Encounter for other orthopedic aftercare: Secondary | ICD-10-CM | POA: Diagnosis not present

## 2021-11-09 ENCOUNTER — Telehealth: Payer: Self-pay

## 2021-11-09 ENCOUNTER — Other Ambulatory Visit: Payer: Self-pay | Admitting: Psychiatry

## 2021-11-09 MED ORDER — ZOLPIDEM TARTRATE 5 MG PO TABS: 5.0000 mg | ORAL_TABLET | Freq: Every evening | ORAL | 0 refills | Status: DC | PRN

## 2021-11-09 MED ORDER — CLONAZEPAM 1 MG PO TABS
1.0000 mg | ORAL_TABLET | Freq: Three times a day (TID) | ORAL | 0 refills | Status: DC | PRN
Start: 2021-11-09 — End: 2021-12-07

## 2021-11-09 NOTE — Telephone Encounter (Signed)
Ordered. Please advise the patient to use these medication judiciously as there is high risk of fall, confusion.

## 2021-11-09 NOTE — Telephone Encounter (Signed)
Medication management - message left for pt, on her identifiable voicemail that Dr. Modesta Messing had sent in new Clonazepam and Ambien orders as she requested to her [place].  Informed Dr. Modesta Messing wanted pt to use these as sparingly as possible due to they both can cause sedation and does not want patient to fall taking too much.  Informed patient both were fall risk medications and again to use judiciously.  Requested pt call back if any further questions or concerns.

## 2021-11-09 NOTE — Telephone Encounter (Signed)
Medication management - Telephone call with pt, after she left a message requesting Dr. Modesta Messing send in new Klonapin and Ambien prescriptions for her.  Pt. reported she takes Ambien 5 mg, 1 qhs and Klonpin 1 mg, three times a day.  Stated Dr. Modesta Messing was going to take over filling these now.  Agreed to send request to Dr. Modesta Messing as pt's next appointment is on 12/07/21.

## 2021-11-12 ENCOUNTER — Ambulatory Visit: Payer: Medicare Other | Attending: Psychiatry

## 2021-11-12 DIAGNOSIS — M25619 Stiffness of unspecified shoulder, not elsewhere classified: Secondary | ICD-10-CM | POA: Diagnosis not present

## 2021-11-12 DIAGNOSIS — F259 Schizoaffective disorder, unspecified: Secondary | ICD-10-CM | POA: Diagnosis present

## 2021-11-12 DIAGNOSIS — M25511 Pain in right shoulder: Secondary | ICD-10-CM | POA: Insufficient documentation

## 2021-11-12 DIAGNOSIS — M6281 Muscle weakness (generalized): Secondary | ICD-10-CM | POA: Diagnosis not present

## 2021-11-12 DIAGNOSIS — R4189 Other symptoms and signs involving cognitive functions and awareness: Secondary | ICD-10-CM | POA: Insufficient documentation

## 2021-11-12 DIAGNOSIS — Z96611 Presence of right artificial shoulder joint: Secondary | ICD-10-CM | POA: Insufficient documentation

## 2021-11-14 NOTE — Therapy (Signed)
OUTPATIENT OCCUPATIONAL THERAPY EVALUATION  Patient Name: Angela Fernandez MRN: [id number] DOB:[birth year], 76 y.o., female Today's Date: 11/14/2021  PCP: Dr. Leonel Ramsay REFERRING PROVIDER: Dr. Norman Clay   OT End of Session - 11/14/21 [phone]     Visit Number 1    Number of Visits 12    Date for OT Re-Evaluation 02/04/22    OT Start Time 80    OT Stop Time 1520    OT Time Calculation (min) 80 min    Activity Tolerance Patient tolerated treatment well    Behavior During Therapy WFL for tasks assessed/performed             Past Medical History:  Diagnosis Date   Anemia    Arthritis    Bipolar affect, depressed ([place])    COPD (chronic obstructive pulmonary disease) ([place])    Diabetes mellitus without complication ([place])    type II   GERD (gastroesophageal reflux disease)    Hyperlipidemia    Hypertension    Hypothyroidism    PTSD (post-traumatic stress disorder)    Schizoaffective disorder ([place])    Sleep apnea    cpap   Stroke ([place])    hx of  mini stroke - 2001   TIA (transient ischemic attack) 2010   UTI (urinary tract infection)    Past Surgical History:  Procedure Laterality Date   BILATERAL CARPAL TUNNEL RELEASE Bilateral    BREAST BIOPSY     BREAST SURGERY Right    lumpectomy   CATARACT EXTRACTION W/ INTRAOCULAR LENS  IMPLANT, BILATERAL Bilateral    CHOLECYSTECTOMY     COLONOSCOPY     ESOPHAGOGASTRODUODENOSCOPY     EYE SURGERY     JOINT REPLACEMENT     NASAL SINUS SURGERY     REPLACEMENT TOTAL KNEE BILATERAL Bilateral    REVERSE SHOULDER ARTHROPLASTY Right 09/02/2020   Procedure: REVERSE SHOULDER ARTHROPLASTY;  Surgeon: Corky Mull, MD;  Location: [facility] ORS;  Service: Orthopedics;  Laterality: Right;   SHOULDER SURGERY Right 2020   rotator cuff repair   TOTAL SHOULDER REVISION Right 10/23/2021   Procedure: REVERSE SHOULDER REVISION;  Surgeon: Nicholes Stairs, MD;  Location: [facility];  Service: Orthopedics;  Laterality: Right;  180   Patient  Active Problem List   Diagnosis Date Noted   S/P reverse total shoulder arthroplasty, right 10/23/2021   Cystocele with rectocele 04/10/2021   Mixed urinary incontinence due to female genital prolapse 04/10/2021   Submucous leiomyoma of uterus 04/10/2021   Thickened endometrium 04/10/2021   Pelvic pressure in female 02/11/2021   DJD (degenerative joint disease) of cervical spine 12/28/2020   Anxiety, generalized 12/28/2020   History of depression 12/28/2020   Memory difficulty 12/28/2020   Chronic venous insufficiency 12/24/2020   Lymphedema 12/24/2020   Diabetes ([place]) 12/24/2020   Hyperlipidemia 12/24/2020   Essential hypertension 12/24/2020   Closed fracture of neck of right scapula 10/17/2020   Status post reverse total shoulder replacement, right 09/02/2020   Rotator cuff arthropathy, right 07/14/2020    ONSET DATE: 10/13/21 (referral date)  REFERRING DIAG: Cognitive decline, schizoaffective disorder, unspecified type  THERAPY DIAG:  Cognitive decline  Schizoaffective disorder, unspecified type ([place])  Rationale for Evaluation and Treatment Rehabilitation  SUBJECTIVE: "I'm actively looking for a house.  I plan to have my granddaughter stay with me."    Pt accompanied by: self  PERTINENT HISTORY: Per chart, Schizoaffective disorder, PTSD, anxiety, depression, insomnia, sleep apnea, occasional passive SI, occasional AH (voices); Recent R reverse TSA on  10/23/21.  Per pt, she moved from [place], approximately 1 year and 4-5 months ago to move in with her daughter and granddaughter.  Pt reports she is actively looking for a house to move out, with plans to have granddaughter live with her.  Pt reports being eager to be more indep in her living situation.   PRECAUTIONS: Shoulder (pt will have PT evaluation for R reverse TSA; PT to confirm any shoulder precautions with Dr. Victorino December at [facility])   PAIN:  Are you having pain? Pt reports moderate pain in R shoulder with  activity, which occasionally limits her driving.    LIVING ENVIRONMENT: Lives with daughter and granddaughter at present, though looking to move out to a home just with granddaughter.  PLOF:  Pt is indep with basic self care and daughter manages medications and finances at present.  Pt states she would like to be more indep with both.   PATIENT GOALS : to move out and live more independently, possibly with granddaughter living with her.    OBJECTIVE:  ADLs: See KELS assessment in chart  FUNCTIONAL OUTCOME MEASURES: KELS: completed 11/12/21; see chart for details FOTO: To be completed in future session   HAND FUNCTION: No deficits.  Handwriting 100% legible.  Able to pick up coins from table without difficulty.  COGNITION: Overall cognitive status: History of cognitive impairments - at baseline Pt A&O x4.  Extra time needed to process information on a paper utility bill.  Forgetful to her zip code when addressing an envelope.    OBSERVATIONS:  Very pleasant, cooperative with all assessment activities.  OT questioned confabulation when listing leisure activities.  Pt started to list watching the news and taking rides in the country.  When asked if there was anything else, she went on to say, "boating, horseback riding."  Pt lacks social interaction other than daughter and granddaughter and states she does not have any friends.     COMMUNITY MOBILITY: Pt drives her car short distances to familiar places, but R shoulder pain limits driving and pt does not know how to utilize phone to   help navigate new locations.  Pt does take [facility] or [facility] taxi when needed.  TODAY'S TREATMENT:  Completion of KELS assessment.  See chart for assessment findings and recommendations.   PATIENT EDUCATION: Education details: Role of OT, goals Person educated: Patient Education method: Explanation Education comprehension: verbalized understanding   GOALS: Goals reviewed with patient?  Yes  SHORT TERM GOALS: Target date: 12/24/21    Pt will utilize visual reminders to independently identify emergency numbers and frequent contacts (ie family members, MD offices) to call when in need of assistance. Baseline: Pt was unsure whether "911" was the correct number to call for a house fire and reported being unable to search for information on her phone to identify people/places. Goal status: INITIAL  2.  Pt will be indep to address an envelope for mailing a bill or letter. Baseline: Pt forgetful to her zip code and left off the PO box number on a simulated utility bill. Goal status: INITIAL  3. Pt will complete Pill Box Assessment Test to determine level of assist needed for medication management.  Baseline: Not yet completed.  Goal status: Initial  4. Pt will develop organizational system (Ie: calendar/chart) to identify when monthly bills are due for timely bill paying.  Baseline: Not yet initiated.  Goal status: Initial   LONG TERM GOALS: Target date: 02/04/22  Pt will write checks for monthly bills with min supv for accuracy and completion.  Baseline: Daughter currently manages bills. Goal status: INITIAL  2.  Pt will manage medications with mod A. (May revise supervision level following completion of Pill Box assessment) Baseline: Daughter currently manages medications Goal status: INITIAL  3.  Pt will be indep to utilize smart phone to look up desired information for increasing community engagement and socialization (locations and phone numbers for [employer], movie theaters, parks) Baseline: Pt reports she does not know how to look up information on her smart phone (Brief instruction given at eval but additional training needed). Goal status: INITIAL  4.  Pt will demo indep with use of map quest/google maps or a like app to increase indep with community mobility. Baseline: Pt can drive to a few familiar locations (granddaughter's place of work, MD  offices), but otherwise does not know how to utilize phone to access new locations. Goal status: INITIAL  ASSESSMENT:  CLINICAL IMPRESSION: Patient is a 76 y.o. female who was seen today for occupational therapy evaluation for completion of KELS assessment.  General findings indicate indep with basic self care, but requires assist with money management in the areas of payment of bills and maintaining source of income, use of telephone, employment/leisure participation, cognition, specifically memory and slower processing of information; see KELS assessment for details.  Assessment findings indicate pt would be appropriate to live alone with the following assistance: Lifeline or other electronic safety notification device, regular phone calls to check in, regular in-person visits to check in, supv with bills and money management, transportation to unfamiliar places, and supv to increase engagement in leisure participation.  Pt would benefit from additional OT to maximize indep in these areas, and provide pt/family education on necessary level of supervision in these areas of deficits.  Pt reports eagerness and willingness to participate in OT to maximize indep in these areas, as she is eager to move out and find a home where granddaughter can live with her.   PERFORMANCE DEFICITS in functional skills including IADLs and pain, cognitive skills including memory and information processing , and psychosocial skills including environmental adaptation and interpersonal interactions.   IMPAIRMENTS are limiting patient from IADLs, leisure, and social participation.   COMORBIDITIES may have co-morbidities  that affects occupational performance. Patient will benefit from skilled OT to address above impairments and improve overall function.  MODIFICATION OR ASSISTANCE TO COMPLETE EVALUATION: Min-Moderate modification of tasks or assist with assess necessary to complete an evaluation.  OT OCCUPATIONAL PROFILE AND  HISTORY: Detailed assessment: Review of records and additional review of physical, cognitive, psychosocial history related to current functional performance.  CLINICAL DECISION MAKING: Moderate - several treatment options, min-mod task modification necessary  REHAB POTENTIAL: Good  EVALUATION COMPLEXITY: High      PLAN: OT FREQUENCY: 1x/week  OT DURATION: 12 weeks  PLANNED INTERVENTIONS: self care/ADL training, therapeutic activity, cognitive remediation/compensation, and psychosocial skills training  RECOMMENDED OTHER SERVICES: PT for shoulder rehab (referral requested and faxed to surgeon)  CONSULTED AND AGREED WITH PLAN OF CARE: Patient  PLAN FOR NEXT SESSION: FOTO completion, IADL training (see goals)   Leta Speller, MS, OTR/L  Darleene Cleaver, OT 11/14/2021, 6:18 AM

## 2021-11-17 ENCOUNTER — Ambulatory Visit: Payer: Medicare Other | Admitting: Occupational Therapy

## 2021-11-17 ENCOUNTER — Ambulatory Visit: Payer: Medicare Other | Admitting: Psychiatry

## 2021-11-17 DIAGNOSIS — R4189 Other symptoms and signs involving cognitive functions and awareness: Secondary | ICD-10-CM

## 2021-11-17 DIAGNOSIS — M25619 Stiffness of unspecified shoulder, not elsewhere classified: Secondary | ICD-10-CM | POA: Diagnosis not present

## 2021-11-17 DIAGNOSIS — F259 Schizoaffective disorder, unspecified: Secondary | ICD-10-CM

## 2021-11-17 DIAGNOSIS — M25511 Pain in right shoulder: Secondary | ICD-10-CM | POA: Diagnosis not present

## 2021-11-17 DIAGNOSIS — Z96611 Presence of right artificial shoulder joint: Secondary | ICD-10-CM | POA: Diagnosis not present

## 2021-11-17 DIAGNOSIS — M6281 Muscle weakness (generalized): Secondary | ICD-10-CM | POA: Diagnosis not present

## 2021-11-19 ENCOUNTER — Ambulatory Visit: Payer: Medicare Other

## 2021-11-21 ENCOUNTER — Encounter: Payer: Self-pay | Admitting: Occupational Therapy

## 2021-11-21 NOTE — Therapy (Signed)
OUTPATIENT OCCUPATIONAL THERAPY TREATMENT  Patient Name: Angela Fernandez MRN: [id number] DOB:1946-01-19, 76 y.o., female Today's Date: 11/21/2021  PCP: Dr. Leonel Ramsay REFERRING PROVIDER: Dr. Norman Clay   OT End of Session - 11/21/21 2024     Visit Number 2    Number of Visits 12    Date for OT Re-Evaluation 02/04/22    OT Start Time 7989    OT Stop Time 1150    OT Time Calculation (min) 59 min    Activity Tolerance Patient tolerated treatment well    Behavior During Therapy WFL for tasks assessed/performed             Past Medical History:  Diagnosis Date   Anemia    Arthritis    Bipolar affect, depressed ([place])    COPD (chronic obstructive pulmonary disease) ([place])    Diabetes mellitus without complication ([place])    type II   GERD (gastroesophageal reflux disease)    Hyperlipidemia    Hypertension    Hypothyroidism    PTSD (post-traumatic stress disorder)    Schizoaffective disorder ([place])    Sleep apnea    cpap   Stroke ([place])    hx of  mini stroke - 2001   TIA (transient ischemic attack) 2010   UTI (urinary tract infection)    Past Surgical History:  Procedure Laterality Date   BILATERAL CARPAL TUNNEL RELEASE Bilateral    BREAST BIOPSY     BREAST SURGERY Right    lumpectomy   CATARACT EXTRACTION W/ INTRAOCULAR LENS  IMPLANT, BILATERAL Bilateral    CHOLECYSTECTOMY     COLONOSCOPY     ESOPHAGOGASTRODUODENOSCOPY     EYE SURGERY     JOINT REPLACEMENT     NASAL SINUS SURGERY     REPLACEMENT TOTAL KNEE BILATERAL Bilateral    REVERSE SHOULDER ARTHROPLASTY Right 09/02/2020   Procedure: REVERSE SHOULDER ARTHROPLASTY;  Surgeon: Corky Mull, MD;  Location: [facility] ORS;  Service: Orthopedics;  Laterality: Right;   SHOULDER SURGERY Right 2020   rotator cuff repair   TOTAL SHOULDER REVISION Right 10/23/2021   Procedure: REVERSE SHOULDER REVISION;  Surgeon: Nicholes Stairs, MD;  Location: [facility];  Service: Orthopedics;  Laterality: Right;  180   Patient  Active Problem List   Diagnosis Date Noted   S/P reverse total shoulder arthroplasty, right 10/23/2021   Cystocele with rectocele 04/10/2021   Mixed urinary incontinence due to female genital prolapse 04/10/2021   Submucous leiomyoma of uterus 04/10/2021   Thickened endometrium 04/10/2021   Pelvic pressure in female 02/11/2021   DJD (degenerative joint disease) of cervical spine 12/28/2020   Anxiety, generalized 12/28/2020   History of depression 12/28/2020   Memory difficulty 12/28/2020   Chronic venous insufficiency 12/24/2020   Lymphedema 12/24/2020   Diabetes ([place]) 12/24/2020   Hyperlipidemia 12/24/2020   Essential hypertension 12/24/2020   Closed fracture of neck of right scapula 10/17/2020   Status post reverse total shoulder replacement, right 09/02/2020   Rotator cuff arthropathy, right 07/14/2020    ONSET DATE: 10/13/21 (referral date)  REFERRING DIAG: Cognitive decline, schizoaffective disorder, unspecified type  THERAPY DIAG:  Cognitive decline  Schizoaffective disorder, unspecified type ([place])  Rationale for Evaluation and Treatment Rehabilitation  SUBJECTIVE: Pt reports she thought her appt was at 1045 today.  Pt was on the schedule for the afternoon at 130.  She arrived via [place].    Pt accompanied by: self  PERTINENT HISTORY: Per chart, Schizoaffective disorder, PTSD, anxiety, depression, insomnia, sleep apnea, occasional  passive SI, occasional AH (voices); Recent R reverse TSA on 10/23/21.  Per pt, she moved from [place], approximately 1 year and 4-5 months ago to move in with her daughter and granddaughter.  Pt reports she is actively looking for a house to move out, with plans to have granddaughter live with her.  Pt reports being eager to be more indep in her living situation.   PRECAUTIONS: Shoulder (pt will have PT evaluation for R reverse TSA; PT to confirm any shoulder precautions with Dr. Victorino December at [facility])   PAIN:  Are you having pain? Pt  reports moderate pain in R shoulder with activity, which occasionally limits her driving.    LIVING ENVIRONMENT: Lives with daughter and granddaughter at present, though looking to move out to a home just with granddaughter.  PLOF:  Pt is indep with basic self care and daughter manages medications and finances at present.  Pt states she would like to be more indep with both.   PATIENT GOALS : to move out and live more independently, possibly with granddaughter living with her.    OBJECTIVE:  ADLs: Pt seen this date for use of cell phone, managing messages, text and voice mail.  Pt reports she has voice messages but does not know how to access them.  Pt instructed on step by step instructions for accessing voice mails.  Pt able to demonstrate after therapist instruction and pt performing 2.  Pt indicating she would like to be able to manage her text messages as well.  Pt instructed on how to delete text messages one at a time with therapist demonstration and cues.  Pt advanced to how to delete multiple messages at one time.   Schedule and calendar management:  Pt brought in calendar book this date.  When reviewing calendar, pt apparently had an appt at [facility] clinic at 1045 and not therapy, however she was confused.  Therapist facilitated phone call to [facility] clinic but it was too late for her scheduled appt and they did not have another appt available this date.  Pt was able to reschedule appt for another time.  Reviewed current calendar with scheduled therapy appts, mulitple dates were mislabeled.  Use of white out to erase appts and rewrite correct schedule.  Therapist facilitated another call to a different MD office regarding 2 appts on her calendar to determine which appt was correct.  Formulated list of changes with appts so that patient can follow up regarding transportation.   Pt to practice deleting additional old text messages as a part of her homework.    PATIENT EDUCATION: Education  details: managing messages on cell phone, use of calendar for appointments Person educated: Patient Education method: Explanation, demonstration Education comprehension: verbalized understanding, demonstrated understanding, needs reinforcement   GOALS: Goals reviewed with patient? Yes  SHORT TERM GOALS: Target date: 12/24/21    Pt will utilize visual reminders to independently identify emergency numbers and frequent contacts (ie family members, MD offices) to call when in need of assistance. Baseline: Pt was unsure whether "911" was the correct number to call for a house fire and reported being unable to search for information on her phone to identify people/places. Goal status: INITIAL  2.  Pt will be indep to address an envelope for mailing a bill or letter. Baseline: Pt forgetful to her zip code and left off the PO box number on a simulated utility bill. Goal status: INITIAL  3. Pt will complete Pill Box Assessment Test  to determine level of assist needed for medication management.  Baseline: Not yet completed.  Goal status: Initial  4. Pt will develop organizational system (Ie: calendar/chart) to identify when monthly bills are due for timely bill paying.  Baseline: Not yet initiated.  Goal status: Initial   LONG TERM GOALS: Target date: 02/04/22    Pt will write checks for monthly bills with min supv for accuracy and completion.  Baseline: Daughter currently manages bills. Goal status: INITIAL  2.  Pt will manage medications with mod A. (May revise supervision level following completion of Pill Box assessment) Baseline: Daughter currently manages medications Goal status: INITIAL  3.  Pt will be indep to utilize smart phone to look up desired information for increasing community engagement and socialization (locations and phone numbers for [employer], movie theaters, parks) Baseline: Pt reports she does not know how to look up information on her smart phone (Brief  instruction given at eval but additional training needed). Goal status: INITIAL  4.  Pt will demo indep with use of map quest/google maps or a like app to increase indep with community mobility. Baseline: Pt can drive to a few familiar locations (granddaughter's place of work, MD offices), but otherwise does not know how to utilize phone to access new locations. Goal status: INITIAL  ASSESSMENT:  CLINICAL IMPRESSION: Pt arrived at the wrong time for therapy this date, several hours early.  Therapist accomodated patient's appt early but when working on task of managing patient's calendar, it was evident she had an appt this date at [facility] and was confused thinking the appt was for therapy.  Therapist helped patient to get her other appt rescheduled.  Pt with multiple errors with her calendar regarding her appts, some of appts were written on 2 separate lines with the place and time which confused patient.  Therapist was able to instruct patient on correction on calendar to make appts clear and organized for the remainder of the next month. Pt able to demonstrate ability to manage voicemail messages and text messages after instruction, will need to follow up to see if there is carryover after session at home.  Continue to work towards goals in plan of care to maximize safety and independence in necessary daily tasks.   PERFORMANCE DEFICITS in functional skills including IADLs and pain, cognitive skills including memory and information processing , and psychosocial skills including environmental adaptation and interpersonal interactions.   IMPAIRMENTS are limiting patient from IADLs, leisure, and social participation.   COMORBIDITIES may have co-morbidities  that affects occupational performance. Patient will benefit from skilled OT to address above impairments and improve overall function.  MODIFICATION OR ASSISTANCE TO COMPLETE EVALUATION: Min-Moderate modification of tasks or assist with  assess necessary to complete an evaluation.  OT OCCUPATIONAL PROFILE AND HISTORY: Detailed assessment: Review of records and additional review of physical, cognitive, psychosocial history related to current functional performance.  CLINICAL DECISION MAKING: Moderate - several treatment options, min-mod task modification necessary  REHAB POTENTIAL: Good  EVALUATION COMPLEXITY: High      PLAN: OT FREQUENCY: 1x/week  OT DURATION: 12 weeks  PLANNED INTERVENTIONS: self care/ADL training, therapeutic activity, cognitive remediation/compensation, and psychosocial skills training  RECOMMENDED OTHER SERVICES: PT for shoulder rehab (referral requested and faxed to surgeon)  CONSULTED AND AGREED WITH PLAN OF CARE: Patient  PLAN FOR NEXT SESSION: FOTO completion, IADL training (see goals)   Tiarah Shisler T Ashonte Angelucci, OTR/L, CLT   Dmiya Malphrus, OT 11/21/2021, 8:52 PM

## 2021-11-24 ENCOUNTER — Ambulatory Visit
Admission: RE | Admit: 2021-11-24 | Discharge: 2021-11-24 | Disposition: A | Discharge status: Home / Self Care | Payer: Medicare Other | Source: Ambulatory Visit | Attending: Urology | Admitting: Urology

## 2021-11-24 ENCOUNTER — Ambulatory Visit: Payer: Medicare Other

## 2021-11-24 DIAGNOSIS — N390 Urinary tract infection, site not specified: Secondary | ICD-10-CM | POA: Insufficient documentation

## 2021-11-24 DIAGNOSIS — F259 Schizoaffective disorder, unspecified: Secondary | ICD-10-CM

## 2021-11-24 DIAGNOSIS — M25511 Pain in right shoulder: Secondary | ICD-10-CM | POA: Diagnosis not present

## 2021-11-24 DIAGNOSIS — M6281 Muscle weakness (generalized): Secondary | ICD-10-CM | POA: Diagnosis not present

## 2021-11-24 DIAGNOSIS — R4189 Other symptoms and signs involving cognitive functions and awareness: Secondary | ICD-10-CM | POA: Diagnosis not present

## 2021-11-24 DIAGNOSIS — M25619 Stiffness of unspecified shoulder, not elsewhere classified: Secondary | ICD-10-CM | POA: Diagnosis not present

## 2021-11-24 DIAGNOSIS — Z96611 Presence of right artificial shoulder joint: Secondary | ICD-10-CM | POA: Diagnosis not present

## 2021-11-25 NOTE — Therapy (Signed)
OUTPATIENT OCCUPATIONAL THERAPY TREATMENT  Patient Name: Angela Fernandez MRN: [id number] DOB:Jan 17, 1946, 76 y.o., female Today's Date: 11/25/2021  PCP: Dr. Leonel Ramsay REFERRING PROVIDER: Dr. Norman Clay   OT End of Session - 11/25/21 1311     Visit Number 3    Number of Visits 12    Date for OT Re-Evaluation 02/04/22    OT Start Time 2458    OT Stop Time 1600    OT Time Calculation (min) 45 min    Activity Tolerance Patient tolerated treatment well    Behavior During Therapy WFL for tasks assessed/performed             Past Medical History:  Diagnosis Date   Anemia    Arthritis    Bipolar affect, depressed ([place])    COPD (chronic obstructive pulmonary disease) ([place])    Diabetes mellitus without complication ([place])    type II   GERD (gastroesophageal reflux disease)    Hyperlipidemia    Hypertension    Hypothyroidism    PTSD (post-traumatic stress disorder)    Schizoaffective disorder ([place])    Sleep apnea    cpap   Stroke ([place])    hx of  mini stroke - 2001   TIA (transient ischemic attack) 2010   UTI (urinary tract infection)    Past Surgical History:  Procedure Laterality Date   BILATERAL CARPAL TUNNEL RELEASE Bilateral    BREAST BIOPSY     BREAST SURGERY Right    lumpectomy   CATARACT EXTRACTION W/ INTRAOCULAR LENS  IMPLANT, BILATERAL Bilateral    CHOLECYSTECTOMY     COLONOSCOPY     ESOPHAGOGASTRODUODENOSCOPY     EYE SURGERY     JOINT REPLACEMENT     NASAL SINUS SURGERY     REPLACEMENT TOTAL KNEE BILATERAL Bilateral    REVERSE SHOULDER ARTHROPLASTY Right 09/02/2020   Procedure: REVERSE SHOULDER ARTHROPLASTY;  Surgeon: Corky Mull, MD;  Location: [facility] ORS;  Service: Orthopedics;  Laterality: Right;   SHOULDER SURGERY Right 2020   rotator cuff repair   TOTAL SHOULDER REVISION Right 10/23/2021   Procedure: REVERSE SHOULDER REVISION;  Surgeon: Nicholes Stairs, MD;  Location: [facility] OR[facility];  Service: Orthopedics;  Laterality: Right;  180   Patient  Active Problem List   Diagnosis Date Noted   S/P reverse total shoulder arthroplasty, right 10/23/2021   Cystocele with rectocele 04/10/2021   Mixed urinary incontinence due to female genital prolapse 04/10/2021   Submucous leiomyoma of uterus 04/10/2021   Thickened endometrium 04/10/2021   Pelvic pressure in female 02/11/2021   DJD (degenerative joint disease) of cervical spine 12/28/2020   Anxiety, generalized 12/28/2020   History of depression 12/28/2020   Memory difficulty 12/28/2020   Chronic venous insufficiency 12/24/2020   Lymphedema 12/24/2020   Diabetes ([place]) 12/24/2020   Hyperlipidemia 12/24/2020   Essential hypertension 12/24/2020   Closed fracture of neck of right scapula 10/17/2020   Status post reverse total shoulder replacement, right 09/02/2020   Rotator cuff arthropathy, right 07/14/2020    ONSET DATE: 10/13/21 (referral date)  REFERRING DIAG: Cognitive decline, schizoaffective disorder, unspecified type  THERAPY DIAG:  Cognitive decline  Schizoaffective disorder, unspecified type ([place])  Rationale for Evaluation and Treatment Rehabilitation  SUBJECTIVE: Pt reports her R arm is sore, but she's been trying to use it a little bit to write.  Pt accompanied by: self  PERTINENT HISTORY: Per chart, Schizoaffective disorder, PTSD, anxiety, depression, insomnia, sleep apnea, occasional passive SI, occasional AH (voices); Recent R reverse TSA  on 10/23/21.  Per pt, she moved from [place], approximately 1 year and 4-5 months ago to move in with her daughter and granddaughter.  Pt reports she is actively looking for a house to move out, with plans to have granddaughter live with her.  Pt reports being eager to be more indep in her living situation.   PRECAUTIONS: Shoulder (pt will have PT evaluation for R reverse TSA; PT to confirm any shoulder precautions with Dr. Victorino December at [facility])   PAIN:  Are you having pain? Pt reports moderate pain in R shoulder with  activity, which occasionally limits her driving.    LIVING ENVIRONMENT: Lives with daughter and granddaughter at present, though looking to move out to a home just with granddaughter.  PLOF:  Pt is indep with basic self care and daughter manages medications and finances at present.  Pt states she would like to be more indep with both.   PATIENT GOALS : to move out and live more independently, possibly with granddaughter living with her.    OBJECTIVE: Self Care: Pt reported that daughter has taken over writing her appointments in her calendar d/t a recent mix up, but that she was agreeable to review her calendar for any corrections that may be needed.  Appointment dates and times were checked for accuracy, and pt worked to add details for increasing organization with daily routine through use of cell phone to look up new information (ie addresses and phone numbers for appointments).  OT provided instructions for searching appointment locations and phone numbers using her smart phone, and then transferred over this information to her calendar.  Pt practiced searching on phone for near locations for an auto body shop to fix her car, and OT provided instructions on how utilize mapquest or other app to identify distances of these locations to her her own home.     PATIENT EDUCATION: Education details: use of calendar for appointments, searching for new information on smart phone (appointment locations, phone numbers, auto body shops) Person educated: Patient Education method: Explanation, demonstration Education comprehension: verbalized understanding, demonstrated understanding, needs reinforcement   GOALS: Goals reviewed with patient? Yes  SHORT TERM GOALS: Target date: 12/24/21    Pt will utilize visual reminders to independently identify emergency numbers and frequent contacts (ie family members, MD offices) to call when in need of assistance. Baseline: Pt was unsure whether "911" was the  correct number to call for a house fire and reported being unable to search for information on her phone to identify people/places. Goal status: INITIAL  2.  Pt will be indep to address an envelope for mailing a bill or letter. Baseline: Pt forgetful to her zip code and left off the PO box number on a simulated utility bill. Goal status: INITIAL  3. Pt will complete Pill Box Assessment Test to determine level of assist needed for medication management.  Baseline: Not yet completed.  Goal status: Initial  4. Pt will develop organizational system (Ie: calendar/chart) to identify when monthly bills are due for timely bill paying.  Baseline: Not yet initiated.  Goal status: Initial   LONG TERM GOALS: Target date: 02/04/22    Pt will write checks for monthly bills with min supv for accuracy and completion.  Baseline: Daughter currently manages bills. Goal status: INITIAL  2.  Pt will manage medications with mod A. (May revise supervision level following completion of Pill Box assessment) Baseline: Daughter currently manages medications Goal status: INITIAL  3.  Pt will be indep to utilize smart phone to look up desired information for increasing community engagement and socialization (locations and phone numbers for [employer], movie theaters, parks) Baseline: Pt reports she does not know how to look up information on her smart phone (Brief instruction given at eval but additional training needed). Goal status: INITIAL  4.  Pt will demo indep with use of map quest/google maps or a like app to increase indep with community mobility. Baseline: Pt can drive to a few familiar locations (granddaughter's place of work, MD offices), but otherwise does not know how to utilize phone to access new locations. Goal status: INITIAL  ASSESSMENT:  CLINICAL IMPRESSION: Focus today was on use of cell phone to access new information, including addresses for various upcoming appointments, phone  numbers to those office locations, and looking up local autobody shops as pt reports her car is in need of repair.  Pt was able to utilize cell phone to identify 3 possible local autobody shops, as well as addresses and phone numbers for upcoming appointments with mod A by end of session.  Pt will benefit from continued OT to work towards goals in plan of care to maximize safety and independence in necessary daily tasks.   PERFORMANCE DEFICITS in functional skills including IADLs and pain, cognitive skills including memory and information processing , and psychosocial skills including environmental adaptation and interpersonal interactions.   IMPAIRMENTS are limiting patient from IADLs, leisure, and social participation.   COMORBIDITIES may have co-morbidities  that affects occupational performance. Patient will benefit from skilled OT to address above impairments and improve overall function.  MODIFICATION OR ASSISTANCE TO COMPLETE EVALUATION: Min-Moderate modification of tasks or assist with assess necessary to complete an evaluation.  OT OCCUPATIONAL PROFILE AND HISTORY: Detailed assessment: Review of records and additional review of physical, cognitive, psychosocial history related to current functional performance.  CLINICAL DECISION MAKING: Moderate - several treatment options, min-mod task modification necessary  REHAB POTENTIAL: Good  EVALUATION COMPLEXITY: High      PLAN: OT FREQUENCY: 1x/week  OT DURATION: 12 weeks  PLANNED INTERVENTIONS: self care/ADL training, therapeutic activity, cognitive remediation/compensation, and psychosocial skills training  RECOMMENDED OTHER SERVICES: PT for shoulder rehab (referral requested and faxed to surgeon)  CONSULTED AND AGREED WITH PLAN OF CARE: Patient  PLAN FOR NEXT SESSION: FOTO completion, IADL training (see goals)   Leta Speller, MS, OTR/L  Darleene Cleaver, OT 11/25/2021, 1:12 PM

## 2021-12-01 DIAGNOSIS — M48062 Spinal stenosis, lumbar region with neurogenic claudication: Secondary | ICD-10-CM | POA: Diagnosis not present

## 2021-12-01 DIAGNOSIS — M9931 Osseous stenosis of neural canal of cervical region: Secondary | ICD-10-CM | POA: Diagnosis not present

## 2021-12-01 DIAGNOSIS — M5412 Radiculopathy, cervical region: Secondary | ICD-10-CM | POA: Diagnosis not present

## 2021-12-01 DIAGNOSIS — M542 Cervicalgia: Secondary | ICD-10-CM | POA: Diagnosis not present

## 2021-12-01 DIAGNOSIS — M5441 Lumbago with sciatica, right side: Secondary | ICD-10-CM | POA: Diagnosis not present

## 2021-12-01 DIAGNOSIS — G8929 Other chronic pain: Secondary | ICD-10-CM | POA: Diagnosis not present

## 2021-12-01 DIAGNOSIS — M5442 Lumbago with sciatica, left side: Secondary | ICD-10-CM | POA: Diagnosis not present
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
## 2021-12-02 ENCOUNTER — Other Ambulatory Visit: Payer: Self-pay | Admitting: *Deleted

## 2021-12-02 DIAGNOSIS — Z87891 Personal history of nicotine dependence: Secondary | ICD-10-CM

## 2021-12-02 DIAGNOSIS — Z122 Encounter for screening for malignant neoplasm of respiratory organs: Secondary | ICD-10-CM

## 2021-12-03 ENCOUNTER — Ambulatory Visit: Payer: Medicare Other | Admitting: Psychiatry

## 2021-12-03 ENCOUNTER — Encounter: Payer: Medicare Other | Admitting: Occupational Therapy

## 2021-12-04 ENCOUNTER — Ambulatory Visit: Payer: Medicare Other

## 2021-12-04 VITALS — BP 120/67 | HR 69

## 2021-12-04 DIAGNOSIS — M25511 Pain in right shoulder: Secondary | ICD-10-CM

## 2021-12-04 DIAGNOSIS — F259 Schizoaffective disorder, unspecified: Secondary | ICD-10-CM

## 2021-12-04 DIAGNOSIS — M25619 Stiffness of unspecified shoulder, not elsewhere classified: Secondary | ICD-10-CM

## 2021-12-04 DIAGNOSIS — Z96611 Presence of right artificial shoulder joint: Secondary | ICD-10-CM | POA: Diagnosis not present

## 2021-12-04 DIAGNOSIS — R4189 Other symptoms and signs involving cognitive functions and awareness: Secondary | ICD-10-CM | POA: Diagnosis not present

## 2021-12-04 DIAGNOSIS — M6281 Muscle weakness (generalized): Secondary | ICD-10-CM | POA: Diagnosis not present

## 2021-12-04 NOTE — Therapy (Signed)
OUTPATIENT OCCUPATIONAL THERAPY TREATMENT  Patient Name: Angela Fernandez MRN: [id number] DOB:1945-08-17, 76 y.o., female Today's Date: 12/04/2021  PCP: Dr. Leonel Ramsay REFERRING PROVIDER: Dr. Norman Clay   OT End of Session - 12/04/21 2049     Visit Number 4    Number of Visits 12    Date for OT Re-Evaluation 02/04/22    OT Start Time 1105    OT Stop Time 1150    OT Time Calculation (min) 45 min    Activity Tolerance Patient tolerated treatment well    Behavior During Therapy WFL for tasks assessed/performed             Past Medical History:  Diagnosis Date   Anemia    Arthritis    Bipolar affect, depressed ([place])    COPD (chronic obstructive pulmonary disease) ([place])    Diabetes mellitus without complication ([place])    type II   GERD (gastroesophageal reflux disease)    Hyperlipidemia    Hypertension    Hypothyroidism    PTSD (post-traumatic stress disorder)    Schizoaffective disorder ([place])    Sleep apnea    cpap   Stroke ([place])    hx of  mini stroke - 2001   TIA (transient ischemic attack) 2010   UTI (urinary tract infection)    Past Surgical History:  Procedure Laterality Date   BILATERAL CARPAL TUNNEL RELEASE Bilateral    BREAST BIOPSY     BREAST SURGERY Right    lumpectomy   CATARACT EXTRACTION W/ INTRAOCULAR LENS  IMPLANT, BILATERAL Bilateral    CHOLECYSTECTOMY     COLONOSCOPY     ESOPHAGOGASTRODUODENOSCOPY     EYE SURGERY     JOINT REPLACEMENT     NASAL SINUS SURGERY     REPLACEMENT TOTAL KNEE BILATERAL Bilateral    REVERSE SHOULDER ARTHROPLASTY Right 09/02/2020   Procedure: REVERSE SHOULDER ARTHROPLASTY;  Surgeon: Corky Mull, MD;  Location: [facility];  Service: Orthopedics;  Laterality: Right;   SHOULDER SURGERY Right 2020   rotator cuff repair   TOTAL SHOULDER REVISION Right 10/23/2021   Procedure: REVERSE SHOULDER REVISION;  Surgeon: Nicholes Stairs, MD;  Location: [facility];  Service: Orthopedics;  Laterality: Right;  180   Patient  Active Problem List   Diagnosis Date Noted   S/P reverse total shoulder arthroplasty, right 10/23/2021   Cystocele with rectocele 04/10/2021   Mixed urinary incontinence due to female genital prolapse 04/10/2021   Submucous leiomyoma of uterus 04/10/2021   Thickened endometrium 04/10/2021   Pelvic pressure in female 02/11/2021   DJD (degenerative joint disease) of cervical spine 12/28/2020   Anxiety, generalized 12/28/2020   History of depression 12/28/2020   Memory difficulty 12/28/2020   Chronic venous insufficiency 12/24/2020   Lymphedema 12/24/2020   Diabetes ([place]) 12/24/2020   Hyperlipidemia 12/24/2020   Essential hypertension 12/24/2020   Closed fracture of neck of right scapula 10/17/2020   Status post reverse total shoulder replacement, right 09/02/2020   Rotator cuff arthropathy, right 07/14/2020    ONSET DATE: 10/13/21 (referral date)  REFERRING DIAG: Cognitive decline, schizoaffective disorder, unspecified type  THERAPY DIAG:  Cognitive decline  Schizoaffective disorder, unspecified type ([place])  Rationale for Evaluation and Treatment Rehabilitation  SUBJECTIVE: Pt reports she obtained a stylus as recommended by OT for increasing accuracy when operating her phone.   Pt accompanied by: self  PERTINENT HISTORY: Per chart, Schizoaffective disorder, PTSD, anxiety, depression, insomnia, sleep apnea, occasional passive SI, occasional AH (voices); Recent R reverse TSA on  10/23/21.  Per pt, she moved from [place], approximately 1 year and 4-5 months ago to move in with her daughter and granddaughter.  Pt reports she is actively looking for a house to move out, with plans to have granddaughter live with her.  Pt reports being eager to be more indep in her living situation.  PAIN:  Are you having pain? Pt reports moderate pain in R shoulder with activity, which occasionally limits her driving.     PATIENT GOALS : to move out and live more independently, possibly with  granddaughter living with her.    OBJECTIVE: Self Care: Reviewed strategies for searching on phone for in state and local destinations using phone icons for increasing indep with community mobility.  OT provided step by step written instructions to sequence use of a phone app to obtain directions from current location to 4 other desired locations; pt completed with initial mod vc, but by trial 4 completed with min vc.    PATIENT EDUCATION: Education details: use of phone apps to increase indep with community mobility  Person educated: Patient Education method: Explanation, demonstration, written step by step directions for use of phone app Education comprehension: verbalized understanding, demonstrated understanding, needs reinforcement   GOALS: Goals reviewed with patient? Yes  SHORT TERM GOALS: Target date: 12/24/21    Pt will utilize visual reminders to independently identify emergency numbers and frequent contacts (ie family members, MD offices) to call when in need of assistance. Baseline: Pt was unsure whether "911" was the correct number to call for a house fire and reported being unable to search for information on her phone to identify people/places. Goal status: INITIAL  2.  Pt will be indep to address an envelope for mailing a bill or letter. Baseline: Pt forgetful to her zip code and left off the PO box number on a simulated utility bill. Goal status: INITIAL  3. Pt will complete Pill Box Assessment Test to determine level of assist needed for medication management.  Baseline: Not yet completed.  Goal status: Initial  4. Pt will develop organizational system (Ie: calendar/chart) to identify when monthly bills are due for timely bill paying.  Baseline: Not yet initiated.  Goal status: Initial   LONG TERM GOALS: Target date: 02/04/22    Pt will write checks for monthly bills with min supv for accuracy and completion.  Baseline: Daughter currently manages bills. Goal  status: INITIAL  2.  Pt will manage medications with mod A. (May revise supervision level following completion of Pill Box assessment) Baseline: Daughter currently manages medications Goal status: INITIAL  3.  Pt will be indep to utilize smart phone to look up desired information for increasing community engagement and socialization (locations and phone numbers for [employer], movie theaters, parks) Baseline: Pt reports she does not know how to look up information on her smart phone (Brief instruction given at eval but additional training needed). Goal status: INITIAL  4.  Pt will demo indep with use of map quest/google maps or a like app to increase indep with community mobility. Baseline: Pt can drive to a few familiar locations (granddaughter's place of work, MD offices), but otherwise does not know how to utilize phone to access new locations. Goal status: INITIAL  ASSESSMENT:  CLINICAL IMPRESSION: Reviewed strategies for searching on phone for in state and local destinations using phone icons for increasing indep with community mobility.  OT provided step by step written instructions to sequence use of a phone app to  obtain directions from current location to 4 other desired locations; pt completed with initial mod vc, but by trial 4 completed with min vc.  Pt with improved accuracy operating phone when using her new stylus and reported improved learning when OT provided written instructions for pt to follow.  OT encouraged pt bring written instructions home for continued practice at home with activities completed today.  Pt receptive to all.  Pt will benefit from continued OT to work towards goals in plan of care to maximize safety and independence in necessary daily tasks.   PERFORMANCE DEFICITS in functional skills including IADLs and pain, cognitive skills including memory and information processing , and psychosocial skills including environmental adaptation and interpersonal  interactions.   IMPAIRMENTS are limiting patient from IADLs, leisure, and social participation.   COMORBIDITIES may have co-morbidities  that affects occupational performance. Patient will benefit from skilled OT to address above impairments and improve overall function.  MODIFICATION OR ASSISTANCE TO COMPLETE EVALUATION: Min-Moderate modification of tasks or assist with assess necessary to complete an evaluation.  OT OCCUPATIONAL PROFILE AND HISTORY: Detailed assessment: Review of records and additional review of physical, cognitive, psychosocial history related to current functional performance.  CLINICAL DECISION MAKING: Moderate - several treatment options, min-mod task modification necessary  REHAB POTENTIAL: Good  EVALUATION COMPLEXITY: High      PLAN: OT FREQUENCY: 1x/week  OT DURATION: 12 weeks  PLANNED INTERVENTIONS: self care/ADL training, therapeutic activity, cognitive remediation/compensation, and psychosocial skills training  RECOMMENDED OTHER SERVICES: PT for shoulder rehab (referral requested and faxed to surgeon)  CONSULTED AND AGREED WITH PLAN OF CARE: Patient  PLAN FOR NEXT SESSION: FOTO completion, IADL training (see goals)   Leta Speller, MS, OTR/L  Darleene Cleaver, OT 12/04/2021, 8:50 PM

## 2021-12-04 NOTE — Therapy (Signed)
OUTPATIENT PHYSICAL THERAPY SHOULDER EVALUATION   Patient Name: Nikhita Mentzel MRN: [id number] DOB:03/24/1946, 76 y.o., female Today's Date: 12/04/2021   PT End of Session - 12/04/21 0955     Visit Number 1    Number of Visits 25    Date for PT Re-Evaluation 02/26/22    Authorization Time Period Initial PT cert 4/66/5993- 57/05/7791    Progress Note Due on Visit 10    PT Start Time 0931    PT Stop Time 1029    PT Time Calculation (min) 58 min    Activity Tolerance Patient tolerated treatment well    Behavior During Therapy WFL for tasks assessed/performed             Past Medical History:  Diagnosis Date   Anemia    Arthritis    Bipolar affect, depressed ([place])    COPD (chronic obstructive pulmonary disease) ([place])    Diabetes mellitus without complication ([place])    type II   GERD (gastroesophageal reflux disease)    Hyperlipidemia    Hypertension    Hypothyroidism    PTSD (post-traumatic stress disorder)    Schizoaffective disorder ([place])    Sleep apnea    cpap   Stroke ([place])    hx of  mini stroke - 2001   TIA (transient ischemic attack) 2010   UTI (urinary tract infection)    Past Surgical History:  Procedure Laterality Date   BILATERAL CARPAL TUNNEL RELEASE Bilateral    BREAST BIOPSY     BREAST SURGERY Right    lumpectomy   CATARACT EXTRACTION W/ INTRAOCULAR LENS  IMPLANT, BILATERAL Bilateral    CHOLECYSTECTOMY     COLONOSCOPY     ESOPHAGOGASTRODUODENOSCOPY     EYE SURGERY     JOINT REPLACEMENT     NASAL SINUS SURGERY     REPLACEMENT TOTAL KNEE BILATERAL Bilateral    REVERSE SHOULDER ARTHROPLASTY Right 09/02/2020   Procedure: REVERSE SHOULDER ARTHROPLASTY;  Surgeon: Corky Mull, MD;  Location: [facility];  Service: Orthopedics;  Laterality: Right;   SHOULDER SURGERY Right 2020   rotator cuff repair   TOTAL SHOULDER REVISION Right 10/23/2021   Procedure: REVERSE SHOULDER REVISION;  Surgeon: Nicholes Stairs, MD;  Location: [facility];  Service:  Orthopedics;  Laterality: Right;  180   Patient Active Problem List   Diagnosis Date Noted   S/P reverse total shoulder arthroplasty, right 10/23/2021   Cystocele with rectocele 04/10/2021   Mixed urinary incontinence due to female genital prolapse 04/10/2021   Submucous leiomyoma of uterus 04/10/2021   Thickened endometrium 04/10/2021   Pelvic pressure in female 02/11/2021   DJD (degenerative joint disease) of cervical spine 12/28/2020   Anxiety, generalized 12/28/2020   History of depression 12/28/2020   Memory difficulty 12/28/2020   Chronic venous insufficiency 12/24/2020   Lymphedema 12/24/2020   Diabetes ([place]) 12/24/2020   Hyperlipidemia 12/24/2020   Essential hypertension 12/24/2020   Closed fracture of neck of right scapula 10/17/2020   Status post reverse total shoulder replacement, right 09/02/2020   Rotator cuff arthropathy, right 07/14/2020    PCP: Adrian Prows  REFERRING PROVIDER: Dr. Victorino December ([facility])  REFERRING DIAG: s/p Right Reverse Total shoulder arthroplasty (REVISION) 10/23/2021  THERAPY DIAG:  Muscle weakness (generalized)  S/P reverse total shoulder arthroplasty, right  Right shoulder pain, unspecified chronicity  Limited range of motion (ROM) of shoulder  Rationale for Evaluation and Treatment Rehabilitation  ONSET DATE: 10/23/2021  SUBJECTIVE:  SUBJECTIVE STATEMENT: Patient reports having right shoulder surgery on 10/23/2021 - a revision from previous Reverse TSA done back in April of 2023.   PERTINENT HISTORY: History taken per MD note on 6/16 Victorino December, MD)  Jae Bruck is a 76 y.o. female who complains of right shoulder pain and dysfunction following a elective reverse arthroplasty performed over in [place] a number of months ago.  She had early  failure of the baseplate with loosening and now malposition  PAIN:  Are you having pain? Yes: NPRS scale: 6/10 Pain location: superior and ant shoulder Pain description: ache mostly; but sharp with movement Aggravating factors: active movement; reaching Relieving factors: Rest; Ice; tramadol  PRECAUTIONS: Shoulder- Left VM at [facility] - requesting any protocol faxed to [facility] at [phone]  WEIGHT BEARING RESTRICTIONS No  FALLS:  Has patient fallen in last 6 months? No  LIVING ENVIRONMENT: Lives with: lives with their family and lives with their daughter Lives in: House/apartment Stairs: Yes: External: 4 steps; on right going up Has following equipment at home: None  OCCUPATION: Retired- 77 years Enjoys art  PLOF: Independent  PATIENT GOALS  I want to be able to reach the top of head and also paint as well  OBJECTIVE:   DIAGNOSTIC FINDINGS:  CLINICAL DATA:  Status post reverse total shoulder arthroplasty   EXAM: RIGHT SHOULDER - 1 VIEW   COMPARISON:  Shoulder radiograph dated September 02, 2020   FINDINGS: Status post reverse total shoulder arthroplasty. No evidence of periprosthetic fracture or lucency. Soft tissues are unremarkable.   IMPRESSION: Status post right reverse total shoulder arthroplasty with no evidence of complication on single image.     Electronically Signed   By: Yetta Glassman M.D.   On: 10/23/2021 16:51  PATIENT SURVEYS:  Quick Dash 61 and FOTO 40  COGNITION:  Overall cognitive status: Impaired and History of cognitive impairments - at baseline     SENSATION: Light touch: WFL  POSTURE: Protracted shoulders, Forward head   UPPER EXTREMITY ROM:   Passive ROM Right eval Left eval  Shoulder flexion 118   Shoulder extension NT   Shoulder abduction 105   Shoulder adduction    Shoulder internal rotation NT   Shoulder external rotation NT   Elbow flexion    Elbow extension    Wrist flexion    Wrist extension     Wrist ulnar deviation    Wrist radial deviation    Wrist pronation    Wrist supination    (Blank rows = not tested) (NT- secondary to no protocol sent and uncertain on current precautions- Will reassessment once either discussed with MD or Protocol provided- Left VM with [facility] seeking protocol be faxed to clinic)   UPPER EXTREMITY MMT:  MMT Right eval Left eval  Shoulder flexion  4  Shoulder extension  4  Shoulder abduction  4  Shoulder adduction    Shoulder internal rotation  4  Shoulder external rotation  4  Middle trapezius    Lower trapezius    Elbow flexion 3+ 4  Elbow extension 3+ 4  Wrist flexion 3+ 4  Wrist extension 3+ 4  Wrist ulnar deviation    Wrist radial deviation    Wrist pronation    Wrist supination    Grip strength (lbs)    (Blank rows = not tested)    JOINT MOBILITY TESTING:  GH intact with grade 1-2 inf/PA/AP mobs  PALPATION:  Tender to touch along ant shoulder along incision  line and posterior/superior shoulder   TODAY'S TREATMENT:  Performed Evaluation of Right shoulder and reviewed HEP consisting of AROM - right elbow    PATIENT EDUCATION: Education details: Plan of care and purpose of PT.  Person educated: Patient Education method: Explanation, Demonstration, Tactile cues, and Verbal cues Education comprehension: verbalized understanding, returned demonstration, verbal cues required, tactile cues required, and needs further education   HOME EXERCISE PROGRAM: Reviewed active elbow flex/ext/forearm sup/pronation/wrist ext/flex  ASSESSMENT:  CLINICAL IMPRESSION: Patient is a 76 y.o. female who was seen today for physical therapy evaluation and treatment for s/p right reverse TSA (Revision).  Patient has surgery on 10/23/2021 and presents today for initial evaluation. She did not present with any protocol and none located in her chart so Left VM with [facility] to either someone call back or fax protocol to clinic. Evaluation  performed and conservative approach due to unknown precautions. Patient did not have a sling on as well. She reported some pain but stated doing very well. Presents today with healing wound- scar formed with no sign of infection, Limited Passive ROM and use of Right shoulder/arm. Pt will benefit from PT services to address deficits in ROM, strength, mobility, and pain in order to return to full function at home with less shoulder pain.   OBJECTIVE IMPAIRMENTS decreased activity tolerance, decreased mobility, decreased ROM, decreased strength, decreased safety awareness, hypomobility, impaired flexibility, impaired UE functional use, postural dysfunction, and pain.   ACTIVITY LIMITATIONS carrying, lifting, sleeping, bathing, dressing, reach over head, and hygiene/grooming  PARTICIPATION LIMITATIONS: meal prep, cleaning, laundry, driving, shopping, community activity, and yard work  PERSONAL FACTORS Behavior pattern and 1-2 comorbidities: COPD, Mult shoulder surgeries  are also affecting patient's functional outcome.   REHAB POTENTIAL: Good  CLINICAL DECISION MAKING: Evolving/moderate complexity  EVALUATION COMPLEXITY: Low   GOALS: Goals reviewed with patient? Yes  SHORT TERM GOALS: Target date: 01/15/2022  (Remove Blue Hyperlink)  Pt will be independent with initial HEP in order to improve strength and decrease pain in order to improve pain-free function at home and work. Baseline: 12/04/2021- No formal HEP in place Goal status: INITIAL  LONG TERM GOALS: Target date: 02/26/2022  (Remove Blue Hyperlink)  Pt will be independent with Final HEP in order to improve strength and decrease pain in order to improve pain-free function at home and work. Baseline: 12/04/2021- No formal HEP in place Goal status: INITIAL  2.  Pt will decrease quick DASH score by at least 8% in order to demonstrate clinically significant reduction in disability. Baseline: 12/04/2021= 61 Goal status: INITIAL  3.  Pt  will improve FOTO to target score of 51% to display perceived improvements in ability to complete ADL's.  Baseline: 12/04/2021=40 Goal status: INITIAL  4.  Pt will decrease worst pain as reported on NPRS by at least 3 points in order to demonstrate clinically significant reduction in pain. Baseline: 12/04/2021= 6/10 Right shoulder pain Goal status: INITIAL  5.  Patient will demo > 140 deg of right shoulder elevation (PROM) for improved ROM and overhead activities Baseline: 12/04/2021=PROM R shoulder flex- 118; 105 ABD Goal status: INITIAL  6.  Patient will demo > 100 deg of right shoulder elevation (AROM) for improved ROM and overhead activities Baseline: 12/04/2021=PROM R shoulder flex- 118; 105 ABD Goal status: INITIAL   PLAN: PT FREQUENCY: 2x/week  PT DURATION: 12 weeks  PLANNED INTERVENTIONS: Therapeutic exercises, Therapeutic activity, Patient/Family education, Self Care, Joint mobilization, Dry Needling, Spinal manipulation, Spinal mobilization, Cryotherapy, Moist heat, scar mobilization,  Taping, Ultrasound, and Manual therapy  PLAN FOR NEXT SESSION: Manual Therapy for ROM and advance HEP as appropriate.    [name] Moccasin, PT 12/04/2021, 5:47 PM

## 2021-12-06 ENCOUNTER — Other Ambulatory Visit: Payer: Self-pay | Admitting: Psychiatry

## 2021-12-07 ENCOUNTER — Ambulatory Visit (INDEPENDENT_AMBULATORY_CARE_PROVIDER_SITE_OTHER): Payer: Medicare Other | Admitting: Psychiatry

## 2021-12-07 ENCOUNTER — Encounter: Payer: Self-pay | Admitting: Psychiatry

## 2021-12-07 VITALS — BP 125/72 | HR 79 | Temp 97.9°F | Wt 190.0 lb

## 2021-12-07 DIAGNOSIS — F4310 Post-traumatic stress disorder, unspecified: Secondary | ICD-10-CM

## 2021-12-07 DIAGNOSIS — F411 Generalized anxiety disorder: Secondary | ICD-10-CM | POA: Diagnosis not present

## 2021-12-07 DIAGNOSIS — F259 Schizoaffective disorder, unspecified: Secondary | ICD-10-CM | POA: Diagnosis not present

## 2021-12-07 MED ORDER — CLONAZEPAM 1 MG PO TABS: 1.0000 mg | ORAL_TABLET | Freq: Three times a day (TID) | ORAL | 2 refills | Status: DC | PRN

## 2021-12-07 MED ORDER — ZOLPIDEM TARTRATE 5 MG PO TABS: 5.0000 mg | ORAL_TABLET | Freq: Every evening | ORAL | 2 refills | Status: DC | PRN

## 2021-12-08 ENCOUNTER — Ambulatory Visit: Payer: Medicare Other

## 2021-12-08 DIAGNOSIS — E119 Type 2 diabetes mellitus without complications: Secondary | ICD-10-CM | POA: Diagnosis not present

## 2021-12-08 DIAGNOSIS — G959 Disease of spinal cord, unspecified: Secondary | ICD-10-CM | POA: Diagnosis not present

## 2021-12-08 DIAGNOSIS — M9931 Osseous stenosis of neural canal of cervical region: Secondary | ICD-10-CM | POA: Diagnosis not present

## 2021-12-08 DIAGNOSIS — R413 Other amnesia: Secondary | ICD-10-CM | POA: Diagnosis not present

## 2021-12-10 ENCOUNTER — Ambulatory Visit: Payer: Medicare Other | Attending: Psychiatry

## 2021-12-10 DIAGNOSIS — M6281 Muscle weakness (generalized): Secondary | ICD-10-CM | POA: Diagnosis not present

## 2021-12-10 DIAGNOSIS — F259 Schizoaffective disorder, unspecified: Secondary | ICD-10-CM | POA: Diagnosis present

## 2021-12-10 DIAGNOSIS — M25511 Pain in right shoulder: Secondary | ICD-10-CM | POA: Insufficient documentation

## 2021-12-10 DIAGNOSIS — M25619 Stiffness of unspecified shoulder, not elsewhere classified: Secondary | ICD-10-CM | POA: Insufficient documentation

## 2021-12-10 DIAGNOSIS — R262 Difficulty in walking, not elsewhere classified: Secondary | ICD-10-CM | POA: Diagnosis not present

## 2021-12-10 DIAGNOSIS — R4189 Other symptoms and signs involving cognitive functions and awareness: Secondary | ICD-10-CM | POA: Insufficient documentation

## 2021-12-10 DIAGNOSIS — Z96611 Presence of right artificial shoulder joint: Secondary | ICD-10-CM | POA: Diagnosis not present

## 2021-12-11 NOTE — Therapy (Signed)
OUTPATIENT OCCUPATIONAL THERAPY TREATMENT  Patient Name: Angela Fernandez MRN: [id number] DOB:1946-05-01, 76 y.o., female Today's Date: 12/11/2021  PCP: Dr. Leonel Ramsay REFERRING PROVIDER: Dr. Norman Clay   OT End of Session - 12/11/21 2015     Visit Number 5    Number of Visits 12    Date for OT Re-Evaluation 02/04/22    OT Start Time 7829    OT Stop Time 1600    OT Time Calculation (min) 45 min    Activity Tolerance Patient tolerated treatment well    Behavior During Therapy WFL for tasks assessed/performed             Past Medical History:  Diagnosis Date   Anemia    Arthritis    Bipolar affect, depressed ([place])    COPD (chronic obstructive pulmonary disease) ([place])    Diabetes mellitus without complication ([place])    type II   GERD (gastroesophageal reflux disease)    Hyperlipidemia    Hypertension    Hypothyroidism    PTSD (post-traumatic stress disorder)    Schizoaffective disorder ([place])    Sleep apnea    cpap   Stroke ([place])    hx of  mini stroke - 2001   TIA (transient ischemic attack) 2010   UTI (urinary tract infection)    Past Surgical History:  Procedure Laterality Date   BILATERAL CARPAL TUNNEL RELEASE Bilateral    BREAST BIOPSY     BREAST SURGERY Right    lumpectomy   CATARACT EXTRACTION W/ INTRAOCULAR LENS  IMPLANT, BILATERAL Bilateral    CHOLECYSTECTOMY     COLONOSCOPY     ESOPHAGOGASTRODUODENOSCOPY     EYE SURGERY     JOINT REPLACEMENT     NASAL SINUS SURGERY     REPLACEMENT TOTAL KNEE BILATERAL Bilateral    REVERSE SHOULDER ARTHROPLASTY Right 09/02/2020   Procedure: REVERSE SHOULDER ARTHROPLASTY;  Surgeon: Corky Mull, MD;  Location: [facility];  Service: Orthopedics;  Laterality: Right;   SHOULDER SURGERY Right 2020   rotator cuff repair   TOTAL SHOULDER REVISION Right 10/23/2021   Procedure: REVERSE SHOULDER REVISION;  Surgeon: Nicholes Stairs, MD;  Location: [facility];  Service: Orthopedics;  Laterality: Right;  180   Patient  Active Problem List   Diagnosis Date Noted   S/P reverse total shoulder arthroplasty, right 10/23/2021   Cystocele with rectocele 04/10/2021   Mixed urinary incontinence due to female genital prolapse 04/10/2021   Submucous leiomyoma of uterus 04/10/2021   Thickened endometrium 04/10/2021   Pelvic pressure in female 02/11/2021   DJD (degenerative joint disease) of cervical spine 12/28/2020   Anxiety, generalized 12/28/2020   History of depression 12/28/2020   Memory difficulty 12/28/2020   Chronic venous insufficiency 12/24/2020   Lymphedema 12/24/2020   Diabetes ([place]) 12/24/2020   Hyperlipidemia 12/24/2020   Essential hypertension 12/24/2020   Closed fracture of neck of right scapula 10/17/2020   Status post reverse total shoulder replacement, right 09/02/2020   Rotator cuff arthropathy, right 07/14/2020    ONSET DATE: 10/13/21 (referral date)  REFERRING DIAG: Cognitive decline, schizoaffective disorder, unspecified type  THERAPY DIAG:  Schizoaffective disorder, unspecified type ([place])  Cognitive decline  Rationale for Evaluation and Treatment Rehabilitation  SUBJECTIVE: Pt was excited to report that she has found a house that she and her granddaughter will be moving in to.   Pt accompanied by: self  PERTINENT HISTORY: Per chart, Schizoaffective disorder, PTSD, anxiety, depression, insomnia, sleep apnea, occasional passive SI, occasional AH (voices); Recent  R reverse TSA on 10/23/21.  Per pt, she moved from [place], approximately 1 year and 4-5 months ago to move in with her daughter and granddaughter.  Pt reports she is actively looking for a house to move out, with plans to have granddaughter live with her.  Pt reports being eager to be more indep in her living situation.  PAIN:  Are you having pain? Pt reports moderate pain in R shoulder with activity, which occasionally limits her driving.     PATIENT GOALS : to move out and live more independently, possibly with  granddaughter living with her.    OBJECTIVE: Self Care: Bill paying: Practiced check writing and addressing envelopes with pt requiring intial assist to look up her zip code for the return address on the first of 4 envelopes.  Pt was then able to recall correctly for the remaining 3 envelopes and was able to demo 100% completeness and accuracy with each check and envelope.  OT assisted pt to save her full address in her cell phone for easy look up in case of a memory lapse with her address, and pt was able to return demo of how to locate this personal info on her phone.  Pt also has her address handwritten within her hard copy planner which she carries with her to appointments.  Pt will add her new address to phone and planner as she gets ready to move (pt did not have her new address with her today for OT to assist with input into her phone or planner).  OT provided pt with an organizational handout for monthly bills to list her bills, dates they are due, a column to check when the bill is paid, etc.  Reviewed the miscellaneous section for bills that are not monthly, such as a doctor bill.  Pt plans to use this to start a system for her monthly bills in her new home to ensure timely bill paying.   PATIENT EDUCATION: Education details: organizational strategies for bill paying Person educated: Patient Education method: Explanation, demonstration, written handout Education comprehension: verbalized understanding GOALS: Goals reviewed with patient? Yes  SHORT TERM GOALS: Target date: 12/24/21    Pt will utilize visual reminders to independently identify emergency numbers and frequent contacts (ie family members, MD offices) to call when in need of assistance. Baseline: Pt was unsure whether "911" was the correct number to call for a house fire and reported being unable to search for information on her phone to identify people/places. Goal status: INITIAL  2.  Pt will be indep to address an envelope  for mailing a bill or letter. Baseline: Pt forgetful to her zip code and left off the PO box number on a simulated utility bill. Goal status: INITIAL  3. Pt will complete Pill Box Assessment Test to determine level of assist needed for medication management.  Baseline: Not yet completed.  Goal status: Initial  4. Pt will develop organizational system (Ie: calendar/chart) to identify when monthly bills are due for timely bill paying.  Baseline: Not yet initiated.  Goal status: Initial   LONG TERM GOALS: Target date: 02/04/22    Pt will write checks for monthly bills with min supv for accuracy and completion.  Baseline: Daughter currently manages bills. Goal status: INITIAL  2.  Pt will manage medications with mod A. (May revise supervision level following completion of Pill Box assessment) Baseline: Daughter currently manages medications Goal status: INITIAL  3.  Pt will be indep to utilize smart  phone to look up desired information for increasing community engagement and socialization (locations and phone numbers for local restaurants, movie theaters, parks) Baseline: Pt reports she does not know how to look up information on her smart phone (Brief instruction given at eval but additional training needed). Goal status: INITIAL  4.  Pt will demo indep with use of map quest/google maps or a like app to increase indep with community mobility. Baseline: Pt can drive to a few familiar locations (granddaughter's place of work, MD offices), but otherwise does not know how to utilize phone to access new locations. Goal status: INITIAL  ASSESSMENT:  CLINICAL IMPRESSION: Pt excited to report she found a house which she and her granddaughter will plan to live in.  Pt does not yet have a move in date.  Practiced bill paying with practice checks and addressing envelopes this date as pt plans to take over paying her own bills when she moves out from her daughter's home.  1 initial cue to help pt  retrieve her zip code for return address on the envelope, but otherwise demonstrated accuracy and completeness with above activities, and pt now has her full address written in her planner and inputted into her cell phone in case of a memory lapse.  Pt receptive to organizational strategies for her monthly bills and has a written handout to help with carryover.  Pt will benefit from continued OT to work towards goals in plan of care to maximize safety and independence in necessary daily tasks.   PERFORMANCE DEFICITS in functional skills including IADLs and pain, cognitive skills including memory and information processing , and psychosocial skills including environmental adaptation and interpersonal interactions.   IMPAIRMENTS are limiting patient from IADLs, leisure, and social participation.   COMORBIDITIES may have co-morbidities  that affects occupational performance. Patient will benefit from skilled OT to address above impairments and improve overall function.  MODIFICATION OR ASSISTANCE TO COMPLETE EVALUATION: Min-Moderate modification of tasks or assist with assess necessary to complete an evaluation.  OT OCCUPATIONAL PROFILE AND HISTORY: Detailed assessment: Review of records and additional review of physical, cognitive, psychosocial history related to current functional performance.  CLINICAL DECISION MAKING: Moderate - several treatment options, min-mod task modification necessary  REHAB POTENTIAL: Good  EVALUATION COMPLEXITY: High   PLAN: OT FREQUENCY: 1x/week  OT DURATION: 12 weeks  PLANNED INTERVENTIONS: self care/ADL training, therapeutic activity, cognitive remediation/compensation, and psychosocial skills training  RECOMMENDED OTHER SERVICES: PT for shoulder rehab (referral requested and faxed to surgeon)  CONSULTED AND AGREED WITH PLAN OF CARE: Patient  PLAN FOR NEXT SESSION: FOTO completion, IADL training (see goals)   Leta Speller, MS, OTR/L  Darleene Cleaver,  OT 12/11/2021, 8:21 PM

## 2021-12-13 ENCOUNTER — Other Ambulatory Visit: Payer: Self-pay | Admitting: Family Medicine

## 2021-12-13 DIAGNOSIS — I10 Essential (primary) hypertension: Secondary | ICD-10-CM

## 2021-12-15 ENCOUNTER — Ambulatory Visit: Payer: Medicare Other

## 2021-12-15 DIAGNOSIS — F259 Schizoaffective disorder, unspecified: Secondary | ICD-10-CM | POA: Diagnosis not present

## 2021-12-15 DIAGNOSIS — M6281 Muscle weakness (generalized): Secondary | ICD-10-CM

## 2021-12-15 DIAGNOSIS — Z96611 Presence of right artificial shoulder joint: Secondary | ICD-10-CM

## 2021-12-15 DIAGNOSIS — R262 Difficulty in walking, not elsewhere classified: Secondary | ICD-10-CM | POA: Diagnosis not present

## 2021-12-15 DIAGNOSIS — M25619 Stiffness of unspecified shoulder, not elsewhere classified: Secondary | ICD-10-CM

## 2021-12-15 DIAGNOSIS — M25511 Pain in right shoulder: Secondary | ICD-10-CM

## 2021-12-15 NOTE — Therapy (Incomplete)
OUTPATIENT PHYSICAL THERAPY SHOULDER TREATMENT   Patient Name: Angela Fernandez MRN: [id number] DOB:Oct 05, 1945, 76 y.o., female Today's Date: 12/16/2021   PT End of Session - 12/15/21 1033     Visit Number 2    Number of Visits 25    Date for PT Re-Evaluation 02/26/22    Authorization Time Period Initial PT cert 0/25/8527- 78/24/2353    Progress Note Due on Visit 10    PT Start Time 1033    PT Stop Time 1113    PT Time Calculation (min) 40 min    Activity Tolerance Patient tolerated treatment well    Behavior During Therapy WFL for tasks assessed/performed             Past Medical History:  Diagnosis Date   Anemia    Arthritis    Bipolar affect, depressed ([place])    COPD (chronic obstructive pulmonary disease) ([place])    Diabetes mellitus without complication ([place])    type II   GERD (gastroesophageal reflux disease)    Hyperlipidemia    Hypertension    Hypothyroidism    PTSD (post-traumatic stress disorder)    Schizoaffective disorder ([place])    Sleep apnea    cpap   Stroke ([place])    hx of  mini stroke - 2001   TIA (transient ischemic attack) 2010   UTI (urinary tract infection)    Past Surgical History:  Procedure Laterality Date   BILATERAL CARPAL TUNNEL RELEASE Bilateral    BREAST BIOPSY     BREAST SURGERY Right    lumpectomy   CATARACT EXTRACTION W/ INTRAOCULAR LENS  IMPLANT, BILATERAL Bilateral    CHOLECYSTECTOMY     COLONOSCOPY     ESOPHAGOGASTRODUODENOSCOPY     EYE SURGERY     JOINT REPLACEMENT     NASAL SINUS SURGERY     REPLACEMENT TOTAL KNEE BILATERAL Bilateral    REVERSE SHOULDER ARTHROPLASTY Right 09/02/2020   Procedure: REVERSE SHOULDER ARTHROPLASTY;  Surgeon: Corky Mull, MD;  Location: [facility];  Service: Orthopedics;  Laterality: Right;   SHOULDER SURGERY Right 2020   rotator cuff repair   TOTAL SHOULDER REVISION Right 10/23/2021   Procedure: REVERSE SHOULDER REVISION;  Surgeon: Nicholes Stairs, MD;  Location: [facility];  Service:  Orthopedics;  Laterality: Right;  180   Patient Active Problem List   Diagnosis Date Noted   S/P reverse total shoulder arthroplasty, right 10/23/2021   Cystocele with rectocele 04/10/2021   Mixed urinary incontinence due to female genital prolapse 04/10/2021   Submucous leiomyoma of uterus 04/10/2021   Thickened endometrium 04/10/2021   Pelvic pressure in female 02/11/2021   DJD (degenerative joint disease) of cervical spine 12/28/2020   Anxiety, generalized 12/28/2020   History of depression 12/28/2020   Memory difficulty 12/28/2020   Chronic venous insufficiency 12/24/2020   Lymphedema 12/24/2020   Diabetes ([place]) 12/24/2020   Hyperlipidemia 12/24/2020   Essential hypertension 12/24/2020   Closed fracture of neck of right scapula 10/17/2020   Status post reverse total shoulder replacement, right 09/02/2020   Rotator cuff arthropathy, right 07/14/2020    PCP: Adrian Prows  REFERRING PROVIDER: Dr. Victorino December ([facility])  REFERRING DIAG: s/p Right Reverse Total shoulder arthroplasty (REVISION) 10/23/2021  THERAPY DIAG:  Muscle weakness (generalized)  Right shoulder pain, unspecified chronicity  Limited range of motion (ROM) of shoulder  S/P reverse total shoulder arthroplasty, right  Rationale for Evaluation and Treatment Rehabilitation  ONSET DATE: 10/23/2021  SUBJECTIVE:  Patient reports doing well- Minimal pain in right shoulder.   PERTINENT HISTORY: History taken per MD note on 6/16 Victorino December, MD)  Angela Fernandez is a 76 y.o. female who complains of right shoulder pain and dysfunction following a elective reverse arthroplasty performed over in [place] a number of months ago.  She had early failure of the baseplate with loosening and now malposition  PAIN:  Are you having  pain? Yes: NPRS scale: 6/10 Pain location: superior and ant shoulder Pain description: ache mostly; but sharp with movement Aggravating factors: active movement; reaching Relieving factors: Rest; Ice; tramadol  PRECAUTIONS: Shoulder- Left VM at [facility] - requesting any protocol faxed to [facility] at [phone]   PATIENT GOALS  I want to be able to reach the top of head and also paint as well  OBJECTIVE:   TODAY'S TREATMENT:   Manual Therapy:  PROM R shoulder - Flex, ABD, Scaption, ER/IR x several minutes- Approx 150 deg of elevation Grade 2 PA/AP/Inf GH glides x 30 bouts in varying ROM positions  THEREX  -AAROM R shoulder flex x 15 reps (No pain)  - Supine AROM flex 0-40 deg x 10; 40-70 deg x 10; 70-90 deg x 10  -Scaption AROM 0-75 deg 2 sets of 10 reps  -Supine ER- 2 sets of 10 reps  -Supine IR- 2 sets of 10 reps   -Supine Scaption - VC for technique to avoid shrug - 2 sets of 10 reps  -Seated posterior focused shrugs x 10 reps.   -Seated scap retraction x 10 reps  PATIENT EDUCATION: Education details: Plan of care and purpose of PT.  Person educated: Patient Education method: Explanation, Demonstration, Tactile cues, and Verbal cues Education comprehension: verbalized understanding, returned demonstration, verbal cues required, tactile cues required, and needs further education   HOME EXERCISE PROGRAM: Access Code: [email or web address] URL: [email or web address] Date: 12/16/2021 Prepared by: Sande Brothers  Exercises - Supine Shoulder Flexion AAROM with Hands Clasped  - 1 x daily - 3 sets - 10 reps - Supine Shoulder Flexion Extension Full Range AROM  - 1 x daily - 3 sets - 10 reps - Supine Shoulder Internal Rotation  - 1 x daily - 3 sets - 10 reps - Supine Shoulder External Rotation Stretch  - 1 x daily - 3 sets - 10 reps - Supine Scapular Retraction  - 1 x daily - 3 sets - 10 reps  Reviewed active elbow flex/ext/forearm sup/pronation/wrist  ext/flex  ASSESSMENT:  CLINICAL IMPRESSION: Patient responded well to manual therapy with firm end feel and minimal soreness. She was able to progress per protocol (in Demographic section) with increased VC to avoid UT compensation with active right shoulder elevation. She was issued an updated HEP to reflect week 7 of protocol Pt will benefit from PT services to address deficits in ROM, strength, mobility, and pain in order to return to full function at home with less shoulder pain.   OBJECTIVE IMPAIRMENTS decreased activity tolerance, decreased mobility, decreased ROM, decreased strength, decreased safety awareness, hypomobility, impaired flexibility, impaired UE functional use, postural dysfunction, and pain.   ACTIVITY LIMITATIONS carrying, lifting, sleeping, bathing, dressing, reach over head, and hygiene/grooming  PARTICIPATION LIMITATIONS: meal prep, cleaning, laundry, driving, shopping, community activity, and yard work  PERSONAL FACTORS Behavior pattern and 1-2 comorbidities: COPD, Mult shoulder surgeries  are also affecting patient's functional outcome.   REHAB POTENTIAL: Good  CLINICAL DECISION MAKING: Evolving/moderate complexity  EVALUATION COMPLEXITY: Low   GOALS: Goals reviewed with patient? Yes  SHORT TERM GOALS: Target date: 01/27/2022  (Remove Blue Hyperlink)  Pt will be independent with initial HEP in order to improve strength and decrease pain in order to improve pain-free function at home and work. Baseline: 12/04/2021- No formal HEP in place Goal status: INITIAL  LONG TERM GOALS: Target date: 03/10/2022  (Remove Blue Hyperlink)  Pt will be independent with Final HEP in order to improve strength and decrease pain in order to improve pain-free function at home and work. Baseline: 12/04/2021- No formal HEP in place Goal status: INITIAL  2.  Pt will decrease quick DASH score by at least 8% in order to demonstrate clinically significant reduction in  disability. Baseline: 12/04/2021= 61 Goal status: INITIAL  3.  Pt will improve FOTO to target score of 51% to display perceived improvements in ability to complete ADL's.  Baseline: 12/04/2021=40 Goal status: INITIAL  4.  Pt will decrease worst pain as reported on NPRS by at least 3 points in order to demonstrate clinically significant reduction in pain. Baseline: 12/04/2021= 6/10 Right shoulder pain Goal status: INITIAL  5.  Patient will demo > 140 deg of right shoulder elevation (PROM) for improved ROM and overhead activities Baseline: 12/04/2021=PROM R shoulder flex- 118; 105 ABD Goal status: INITIAL  6.  Patient will demo > 100 deg of right shoulder elevation (AROM) for improved ROM and overhead activities Baseline: 12/04/2021=PROM R shoulder flex- 118; 105 ABD Goal status: INITIAL   PLAN: PT FREQUENCY: 2x/week  PT DURATION: 12 weeks  PLANNED INTERVENTIONS: Therapeutic exercises, Therapeutic activity, Patient/Family education, Self Care, Joint mobilization, Dry Needling, Spinal manipulation, Spinal mobilization, Cryotherapy, Moist heat, scar mobilization, Taping, Ultrasound, and Manual therapy  PLAN FOR NEXT SESSION: Manual Therapy for ROM and advance HEP as appropriate.    [name] Moccasin, PT 12/16/2021, 8:46 AM

## 2021-12-17 ENCOUNTER — Ambulatory Visit: Payer: Medicare Other | Admitting: Occupational Therapy

## 2021-12-17 ENCOUNTER — Ambulatory Visit: Payer: Medicare Other

## 2021-12-17 ENCOUNTER — Ambulatory Visit: Payer: Medicare Other | Admitting: Physician Assistant

## 2021-12-17 ENCOUNTER — Encounter: Payer: Self-pay | Admitting: Occupational Therapy

## 2021-12-17 DIAGNOSIS — M25619 Stiffness of unspecified shoulder, not elsewhere classified: Secondary | ICD-10-CM

## 2021-12-17 DIAGNOSIS — M6281 Muscle weakness (generalized): Secondary | ICD-10-CM

## 2021-12-17 DIAGNOSIS — M25511 Pain in right shoulder: Secondary | ICD-10-CM

## 2021-12-17 DIAGNOSIS — F259 Schizoaffective disorder, unspecified: Secondary | ICD-10-CM | POA: Diagnosis not present

## 2021-12-17 DIAGNOSIS — R262 Difficulty in walking, not elsewhere classified: Secondary | ICD-10-CM | POA: Diagnosis not present

## 2021-12-17 DIAGNOSIS — Z96611 Presence of right artificial shoulder joint: Secondary | ICD-10-CM | POA: Diagnosis not present

## 2021-12-17 NOTE — Therapy (Signed)
OUTPATIENT PHYSICAL THERAPY SHOULDER TREATMENT   Patient Name: Angela Fernandez MRN: [id number] DOB:01-16-1946, 76 y.o., female Today's Date: 12/17/2021   PT End of Session - 12/17/21 1517     Visit Number 3    Number of Visits 25    Date for PT Re-Evaluation 02/26/22    Authorization Time Period Initial PT cert 6/76/7209- 47/01/6282    Progress Note Due on Visit 10    PT Start Time 1515    PT Stop Time 1600    PT Time Calculation (min) 45 min    Activity Tolerance Patient tolerated treatment well    Behavior During Therapy WFL for tasks assessed/performed             Past Medical History:  Diagnosis Date   Anemia    Arthritis    Bipolar affect, depressed ([place])    COPD (chronic obstructive pulmonary disease) ([place])    Diabetes mellitus without complication (HCC)    type II   GERD (gastroesophageal reflux disease)    Hyperlipidemia    Hypertension    Hypothyroidism    PTSD (post-traumatic stress disorder)    Schizoaffective disorder ([place])    Sleep apnea    cpap   Stroke ([place])    hx of  mini stroke - 2001   TIA (transient ischemic attack) 2010   UTI (urinary tract infection)    Past Surgical History:  Procedure Laterality Date   BILATERAL CARPAL TUNNEL RELEASE Bilateral    BREAST BIOPSY     BREAST SURGERY Right    lumpectomy   CATARACT EXTRACTION W/ INTRAOCULAR LENS  IMPLANT, BILATERAL Bilateral    CHOLECYSTECTOMY     COLONOSCOPY     ESOPHAGOGASTRODUODENOSCOPY     EYE SURGERY     JOINT REPLACEMENT     NASAL SINUS SURGERY     REPLACEMENT TOTAL KNEE BILATERAL Bilateral    REVERSE SHOULDER ARTHROPLASTY Right 09/02/2020   Procedure: REVERSE SHOULDER ARTHROPLASTY;  Surgeon: Angela Mull, MD;  Location: [facility] ORS;  Service: Orthopedics;  Laterality: Right;   SHOULDER SURGERY Right 2020   rotator cuff repair   TOTAL SHOULDER REVISION Right 10/23/2021   Procedure: REVERSE SHOULDER REVISION;  Surgeon: Angela Stairs, MD;  Location: [facility];  Service:  Orthopedics;  Laterality: Right;  180   Patient Active Problem List   Diagnosis Date Noted   S/P reverse total shoulder arthroplasty, right 10/23/2021   Cystocele with rectocele 04/10/2021   Mixed urinary incontinence due to female genital prolapse 04/10/2021   Submucous leiomyoma of uterus 04/10/2021   Thickened endometrium 04/10/2021   Pelvic pressure in female 02/11/2021   DJD (degenerative joint disease) of cervical spine 12/28/2020   Anxiety, generalized 12/28/2020   History of depression 12/28/2020   Memory difficulty 12/28/2020   Chronic venous insufficiency 12/24/2020   Lymphedema 12/24/2020   Diabetes ([place]) 12/24/2020   Hyperlipidemia 12/24/2020   Essential hypertension 12/24/2020   Closed fracture of neck of right scapula 10/17/2020   Status post reverse total shoulder replacement, right 09/02/2020   Rotator cuff arthropathy, right 07/14/2020    PCP: Angela Fernandez  REFERRING PROVIDER: Dr. Victorino Fernandez ([facility])  REFERRING DIAG: s/p Right Reverse Total shoulder arthroplasty (REVISION) 10/23/2021  THERAPY DIAG:  Difficulty in walking, not elsewhere classified  Muscle weakness (generalized)  Right shoulder pain, unspecified chronicity  Limited range of motion (ROM) of shoulder  Rationale for Evaluation and Treatment Rehabilitation  ONSET DATE: 10/23/2021  SUBJECTIVE:  Pt notes she has been doing well with her exercises.  Pt states that   PERTINENT HISTORY: History taken per MD note on 6/16 Angela December, MD)  Angela Fernandez is a 76 y.o. female who complains of right shoulder pain and dysfunction following a elective reverse arthroplasty performed over in [place] a number of months ago.  She had early failure of the baseplate with loosening and now malposition  PAIN:  Are you  having pain? Yes: NPRS scale: 4/10 Pain location: superior and ant shoulder Pain description: ache mostly; but sharp with movement Aggravating factors: active movement; reaching Relieving factors: Rest; Ice; tramadol  PRECAUTIONS: Shoulder- Left VM at [facility] - requesting any protocol faxed to [facility] at [phone]   PATIENT GOALS  I want to be able to reach the top of head and also paint as well  OBJECTIVE:   TODAY'S TREATMENT:   Manual Therapy:   PROM R shoulder - Flex, ABD, Scaption, ER/IR x several minutes-  Grade 2 PA/AP/Inf GH glides x 30 bouts in varying ROM positions Supine GHJ distraction for pain relief Supine STM to cervical region to increase extensibility of the paraspinals Supine suboccipital release technique to decrease cervicalgia Supine cervical upglides/downglides, 30 sec bouts Supine UT/Levator stretch, 30 sec bouts to increase tissue extensibility of the cervical region    THEREX:  Scaption AROM 0-75 deg 2 sets of 10 reps Supine ER- 2 sets of 10 reps Supine IR- 2 sets of 10 reps Supine serratus punch with 2# dumbbell - VC for technique  Standing shoulder shrugs with 4# dumbbells each UE, 2x15 Standing scapular retractions with RTB, 2x15 Seated bicep curls with 2# weight, 2x15 each UE Standing shoulder extension, RTB, 2x15   PATIENT EDUCATION: Education details: Plan of care and purpose of PT.  Person educated: Patient Education method: Explanation, Demonstration, Tactile cues, and Verbal cues Education comprehension: verbalized understanding, returned demonstration, verbal cues required, tactile cues required, and needs further education   HOME EXERCISE PROGRAM: Access Code: [email or web address] URL: [email or web address] Date: 12/16/2021 Prepared by: Angela Fernandez  Exercises - Supine Shoulder Flexion AAROM with Hands Clasped  - 1 x daily - 3 sets - 10 reps - Supine Shoulder Flexion Extension Full Range AROM  - 1 x daily  - 3 sets - 10 reps - Supine Shoulder Internal Rotation  - 1 x daily - 3 sets - 10 reps - Supine Shoulder External Rotation Stretch  - 1 x daily - 3 sets - 10 reps - Supine Scapular Retraction  - 1 x daily - 3 sets - 10 reps  Reviewed active elbow flex/ext/forearm sup/pronation/wrist ext/flex  ASSESSMENT:  CLINICAL IMPRESSION:  Pt performed well with resisted exercises and noted no increase in pain.  Pt also responded well to manual therapy, noting a decrease in her neck pain.  Pt noted to be experiencing neck pain upon arrival to the clinic, and is likely related to compensation of musculature within the UT and levator when performing tasks with the UE.  Pt continues to compensate with the UT and needing consistent verbal cuing throughout in order to correct activation of the neck musculature.  Pt will benefit from PT services to address deficits in ROM, strength, mobility, and pain in order to return to full function at home with less shoulder pain.   OBJECTIVE IMPAIRMENTS decreased activity tolerance, decreased mobility, decreased ROM, decreased strength, decreased safety awareness, hypomobility, impaired flexibility, impaired UE functional use, postural dysfunction, and pain.   ACTIVITY LIMITATIONS carrying, lifting, sleeping,  bathing, dressing, reach over head, and hygiene/grooming  PARTICIPATION LIMITATIONS: meal prep, cleaning, laundry, driving, shopping, community activity, and yard work  PERSONAL FACTORS Behavior pattern and 1-2 comorbidities: COPD, Mult shoulder surgeries  are also affecting patient's functional outcome.   REHAB POTENTIAL: Good  CLINICAL DECISION MAKING: Evolving/moderate complexity  EVALUATION COMPLEXITY: Low   GOALS: Goals reviewed with patient? Yes  SHORT TERM GOALS: Target date: 01/28/2022  (Remove Blue Hyperlink)  Pt will be independent with initial HEP in order to improve strength and decrease pain in order to improve pain-free function at home and  work. Baseline: 12/04/2021- No formal HEP in place Goal status: INITIAL  LONG TERM GOALS: Target date: 03/11/2022  (Remove Blue Hyperlink)  Pt will be independent with Final HEP in order to improve strength and decrease pain in order to improve pain-free function at home and work. Baseline: 12/04/2021- No formal HEP in place Goal status: INITIAL  2.  Pt will decrease quick DASH score by at least 8% in order to demonstrate clinically significant reduction in disability. Baseline: 12/04/2021= 61 Goal status: INITIAL  3.  Pt will improve FOTO to target score of 51% to display perceived improvements in ability to complete ADL's.  Baseline: 12/04/2021=40 Goal status: INITIAL  4.  Pt will decrease worst pain as reported on NPRS by at least 3 points in order to demonstrate clinically significant reduction in pain. Baseline: 12/04/2021= 6/10 Right shoulder pain Goal status: INITIAL  5.  Patient will demo > 140 deg of right shoulder elevation (PROM) for improved ROM and overhead activities Baseline: 12/04/2021=PROM R shoulder flex- 118; 105 ABD Goal status: INITIAL  6.  Patient will demo > 100 deg of right shoulder elevation (AROM) for improved ROM and overhead activities Baseline: 12/04/2021=PROM R shoulder flex- 118; 105 ABD Goal status: INITIAL   PLAN: PT FREQUENCY: 2x/week  PT DURATION: 12 weeks  PLANNED INTERVENTIONS: Therapeutic exercises, Therapeutic activity, Patient/Family education, Self Care, Joint mobilization, Dry Needling, Spinal manipulation, Spinal mobilization, Cryotherapy, Moist heat, scar mobilization, Taping, Ultrasound, and Manual therapy  PLAN FOR NEXT SESSION: Manual Therapy for ROM and advance HEP as appropriate.     Gwenlyn Saran, PT, DPT 12/17/21, 3:18 PM

## 2021-12-17 NOTE — Therapy (Signed)
OUTPATIENT OCCUPATIONAL THERAPY TREATMENT  Patient Name: Angela Fernandez MRN: [id number] DOB:1945-10-11, 76 y.o., female Today's Date: 12/17/2021  PCP: Dr. Leonel Ramsay REFERRING PROVIDER: Dr. Norman Clay   OT End of Session - 12/17/21 1623     Visit Number 6    Number of Visits 12    Date for OT Re-Evaluation 02/04/22    OT Start Time 69    OT Stop Time 1515    OT Time Calculation (min) 45 min    Activity Tolerance Patient tolerated treatment well    Behavior During Therapy WFL for tasks assessed/performed             Past Medical History:  Diagnosis Date   Anemia    Arthritis    Bipolar affect, depressed ([place])    COPD (chronic obstructive pulmonary disease) ([place])    Diabetes mellitus without complication ([place])    type II   GERD (gastroesophageal reflux disease)    Hyperlipidemia    Hypertension    Hypothyroidism    PTSD (post-traumatic stress disorder)    Schizoaffective disorder ([place])    Sleep apnea    cpap   Stroke ([place])    hx of  mini stroke - 2001   TIA (transient ischemic attack) 2010   UTI (urinary tract infection)    Past Surgical History:  Procedure Laterality Date   BILATERAL CARPAL TUNNEL RELEASE Bilateral    BREAST BIOPSY     BREAST SURGERY Right    lumpectomy   CATARACT EXTRACTION W/ INTRAOCULAR LENS  IMPLANT, BILATERAL Bilateral    CHOLECYSTECTOMY     COLONOSCOPY     ESOPHAGOGASTRODUODENOSCOPY     EYE SURGERY     JOINT REPLACEMENT     NASAL SINUS SURGERY     REPLACEMENT TOTAL KNEE BILATERAL Bilateral    REVERSE SHOULDER ARTHROPLASTY Right 09/02/2020   Procedure: REVERSE SHOULDER ARTHROPLASTY;  Surgeon: Corky Mull, MD;  Location: [facility];  Service: Orthopedics;  Laterality: Right;   SHOULDER SURGERY Right 2020   rotator cuff repair   TOTAL SHOULDER REVISION Right 10/23/2021   Procedure: REVERSE SHOULDER REVISION;  Surgeon: Nicholes Stairs, MD;  Location: [facility];  Service: Orthopedics;  Laterality: Right;  180   Patient  Active Problem List   Diagnosis Date Noted   S/P reverse total shoulder arthroplasty, right 10/23/2021   Cystocele with rectocele 04/10/2021   Mixed urinary incontinence due to female genital prolapse 04/10/2021   Submucous leiomyoma of uterus 04/10/2021   Thickened endometrium 04/10/2021   Pelvic pressure in female 02/11/2021   DJD (degenerative joint disease) of cervical spine 12/28/2020   Anxiety, generalized 12/28/2020   History of depression 12/28/2020   Memory difficulty 12/28/2020   Chronic venous insufficiency 12/24/2020   Lymphedema 12/24/2020   Diabetes ([place]) 12/24/2020   Hyperlipidemia 12/24/2020   Essential hypertension 12/24/2020   Closed fracture of neck of right scapula 10/17/2020   Status post reverse total shoulder replacement, right 09/02/2020   Rotator cuff arthropathy, right 07/14/2020    ONSET DATE: 10/13/21 (referral date)  REFERRING DIAG: Cognitive decline, schizoaffective disorder, unspecified type  THERAPY DIAG:  Muscle weakness (generalized)  Rationale for Evaluation and Treatment Rehabilitation  SUBJECTIVE: Pt. Reports that it won't be long now until she moves in with her granddaughter.  Pt accompanied by: self  PERTINENT HISTORY: Per chart, Schizoaffective disorder, PTSD, anxiety, depression, insomnia, sleep apnea, occasional passive SI, occasional AH (voices); Recent R reverse TSA on 10/23/21.  Per pt, she moved from [place] approximately 1 year and 4-5 months ago to move in with her daughter and granddaughter.  Pt reports she is actively looking for a house to move out, with plans to have granddaughter live with her.  Pt reports being eager to be more indep in her living situation.  PAIN:  Are you having pain? Pt reports moderate pain in R shoulder with activity, which occasionally limits her driving.     PATIENT GOALS : to move out and live more independently, possibly with granddaughter living with her.    OBJECTIVE:  Self Care:   Pillbox  Set-up and medication management assessment was provided. Pt.  Was able to accurately read 100% of the simulated bottle labels. Pt. Was able to accurately able to set-up the pillbox with 100% accuracy with no omissions or accuracy this afternoon. Pt. Completed the assessment in 6 min & 15 sec. Which is 1 min. & 15 sec. Longer than the 5 min. set time.  Pt. Worked on cell phone management with emphasis placed on navigating through the phone, in order to access the Internet, and look up [employer] options in preparation for making car repairs. Pt. was provided with a step-by-step detailed list of instructions to assist with carry over when attempting the task at home.   PATIENT EDUCATION: Education details: organizational strategies for bill paying Person educated: Patient Education method: Explanation, demonstration, written handout Education comprehension: verbalized understanding GOALS: Goals reviewed with patient? Yes  SHORT TERM GOALS: Target date: 12/24/21    Pt will utilize visual reminders to independently identify emergency numbers and frequent contacts (ie family members, MD offices) to call when in need of assistance. Baseline: Pt was unsure whether "911" was the correct number to call for a house fire and reported being unable to search for information on her phone to identify people/places. Goal status: INITIAL  2.  Pt will be indep to address an envelope for mailing a bill or letter. Baseline: Pt forgetful to her zip code and left off the PO box number on a simulated utility bill. Goal status: INITIAL  3. Pt will complete Pill Box Assessment Test to determine level of assist needed for medication management.  Baseline: Not yet completed.  Goal status: Initial  4. Pt will develop organizational system (Ie: calendar/chart) to identify when monthly bills are due for timely bill paying.  Baseline: Not yet initiated.  Goal status: Initial   LONG TERM GOALS: Target date: 02/04/22     Pt will write checks for monthly bills with min supv for accuracy and completion.  Baseline: Daughter currently manages bills. Goal status: INITIAL  2.  Pt will manage medications with mod A. (May revise supervision level following completion of Pill Box assessment) Baseline: Daughter currently manages medications Goal status: INITIAL  3.  Pt will be indep to utilize smart phone to look up desired information for increasing community engagement and socialization (locations and phone numbers for [employer], movie theaters, parks) Baseline: Pt reports she does not know how to look up information on her smart phone (Brief instruction given at eval but additional training needed). Goal status: INITIAL  4.  Pt will demo indep with use of map quest/google maps or a like app to increase indep with community mobility. Baseline: Pt can drive to a few familiar locations (granddaughter's place of work, MD offices), but otherwise does not know how to utilize phone to access new locations. Goal status: INITIAL  ASSESSMENT:  CLINICAL IMPRESSION:  Pt. was agreeable to  having a  simulated pillbox management assessment, however is reluctant to make this part of her POC moving forward. Pt. reports that she has had a nurse assist her with the task while living in [place], and now her daughter does it for her due to the volume of medications that she is taking. Pt. Reports that her daughter does not have her assist with pillbox set-up at home.  Pt. required increased time to complete the Pillbox assessment, however was able to complete it with 100% accuracy for 5 simulated bottles. Pt. requested to focus on assisting with cell phone internet access, and navigation as she  reports that she really needs to find a Body shop to repair her car. Pt. required consistent cues, and written instructions to carry over for practice at home. Pt. reported being very happy, and excited about having written visual  instructions for continued cell phone navigation practice at home.  PERFORMANCE DEFICITS in functional skills including IADLs and pain, cognitive skills including memory and information processing , and psychosocial skills including environmental adaptation and interpersonal interactions.   IMPAIRMENTS are limiting patient from IADLs, leisure, and social participation.   COMORBIDITIES may have co-morbidities  that affects occupational performance. Patient will benefit from skilled OT to address above impairments and improve overall function.  MODIFICATION OR ASSISTANCE TO COMPLETE EVALUATION: Min-Moderate modification of tasks or assist with assess necessary to complete an evaluation.  OT OCCUPATIONAL PROFILE AND HISTORY: Detailed assessment: Review of records and additional review of physical, cognitive, psychosocial history related to current functional performance.  CLINICAL DECISION MAKING: Moderate - several treatment options, min-mod task modification necessary  REHAB POTENTIAL: Good  EVALUATION COMPLEXITY: High   PLAN: OT FREQUENCY: 1x/week  OT DURATION: 12 weeks  PLANNED INTERVENTIONS: self care/ADL training, therapeutic activity, cognitive remediation/compensation, and psychosocial skills training  RECOMMENDED OTHER SERVICES: PT for shoulder rehab (referral requested and faxed to surgeon)  CONSULTED AND AGREED WITH PLAN OF CARE: Patient  PLAN FOR NEXT SESSION: FOTO completion, IADL training (see goals)   Harrel Carina, MS, OTR/L   Harrel Carina, OT 12/17/2021, 4:26 PM

## 2021-12-18 ENCOUNTER — Encounter: Payer: Self-pay | Admitting: Physician Assistant

## 2021-12-18 ENCOUNTER — Ambulatory Visit (INDEPENDENT_AMBULATORY_CARE_PROVIDER_SITE_OTHER): Payer: Medicare Other | Admitting: Physician Assistant

## 2021-12-18 VITALS — BP 108/67 | HR 64 | Ht 65.0 in | Wt 184.0 lb

## 2021-12-18 DIAGNOSIS — R3915 Urgency of urination: Secondary | ICD-10-CM | POA: Diagnosis not present

## 2021-12-18 DIAGNOSIS — N390 Urinary tract infection, site not specified: Secondary | ICD-10-CM | POA: Diagnosis not present

## 2021-12-18 LAB — URINALYSIS, COMPLETE
Bilirubin, UA: NEGATIVE
Glucose, UA: NEGATIVE
Ketones, UA: NEGATIVE
Leukocytes,UA: NEGATIVE
Nitrite, UA: NEGATIVE
Protein,UA: NEGATIVE
RBC, UA: NEGATIVE
Specific Gravity, UA: 1.02 (ref 1.005–1.030)
Urobilinogen, Ur: 1 mg/dL (ref 0.2–1.0)
pH, UA: 7 (ref 5.0–7.5)

## 2021-12-18 LAB — MICROSCOPIC EXAMINATION: Bacteria, UA: NONE SEEN

## 2021-12-18 LAB — BLADDER SCAN AMB NON-IMAGING

## 2021-12-18 MED ORDER — GEMTESA 75 MG PO TABS: 75.0000 mg | ORAL_TABLET | Freq: Every day | ORAL | 0 refills | Status: DC

## 2021-12-18 NOTE — Progress Notes (Signed)
12/18/2021 2:11 PM   Angela Fernandez June 04, 1945 [id number]  CC: Chief Complaint  Patient presents with   Recurrent UTI   HPI: Angela Fernandez is a 76 y.o. female with PMH recurrent UTI on suppressive trimethoprim per Dr. Matilde Sprang, uncontrolled diabetes, urgency/frequency/lower abdominal pressure, and cystocele who presents today for evaluation of ongoing urgency/frequency, lower abdominal pressure.   Today she reports she remains on suppressive trimethoprim and has also been on amoxicillin due to some upcoming dental work.  Despite this she feels that she developed a UTI last week, but her symptoms resolved on her antibiotics as above.  She describes her symptoms as urgency, frequency, and lower abdominal pressure without "stinging pain".  She reports some urge incontinence as well that has been going on for a long time.  In-office UA and microscopy today pan negative. PVR 50m.  PMH: Past Medical History:  Diagnosis Date   Anemia    Arthritis    [facility]    [facility]    Diabetes mellitus without complication (HCC)    type II   GERD (gastroesophageal reflux disease)    Hyperlipidemia    Hypertension    Hypothyroidism    PTSD (post-traumatic stress disorder)    Schizoaffective disorder ([facility]    Sleep apnea    cpap   [place] [place]    hx of  mini stroke - 2001   TIA (transient ischemic attack) 2010   UTI (urinary tract infection)     Surgical History: Past Surgical History:  Procedure Laterality Date   BILATERAL CARPAL TUNNEL RELEASE Bilateral    BREAST BIOPSY     BREAST SURGERY Right    lumpectomy   CATARACT EXTRACTION W/ INTRAOCULAR LENS  IMPLANT, BILATERAL Bilateral    CHOLECYSTECTOMY     COLONOSCOPY     ESOPHAGOGASTRODUODENOSCOPY     EYE SURGERY     JOINT REPLACEMENT     NASAL SINUS SURGERY     REPLACEMENT TOTAL KNEE BILATERAL Bilateral    REVERSE SHOULDER ARTHROPLASTY Right 09/02/2020    Procedure: REVERSE SHOULDER ARTHROPLASTY;  Surgeon: PCorky Mull MD;  Location: [facility] ORS;  Service: Orthopedics;  Laterality: Right;   SHOULDER SURGERY Right 2020   rotator cuff repair   TOTAL SHOULDER REVISION Right 10/23/2021   Procedure: REVERSE SHOULDER REVISION;  Surgeon: RNicholes Stairs MD;  Location: [facility];  Service: Orthopedics;  Laterality: Right;  180    Home Medications:  Allergies as of 12/18/2021   No Known Allergies      Medication List        Accurate as of December 18, 2021  2:11 PM. If you have any questions, ask your nurse or doctor.          albuterol 108 (90 Base) MCG/ACT inhaler Commonly known as: VENTOLIN HFA Inhale 2 puffs into the lungs every 6 (six) hours as needed for wheezing or shortness of breath.   amoxicillin 500 MG capsule Commonly known as: AMOXIL Take by mouth.   Anoro Ellipta 62.5-25 MCG/ACT Aepb Generic drug: umeclidinium-vilanterol Inhale 2 puffs into the lungs daily as needed (shortness of breath).   AYR SALINE NASAL GEL NA Place 1 application. into the nose every other day.   buPROPion 200 MG 12 hr tablet Commonly known as: WELLBUTRIN SR Take 200 mg by mouth 2 (two) times daily.   clonazePAM 1 MG tablet Commonly known as: KLONOPIN Take 1 tablet (1 mg total) by mouth 3 (three) times daily as  needed for anxiety.   desvenlafaxine 50 MG 24 hr tablet Commonly known as: PRISTIQ Take 1 tablet by mouth at bedtime.   Desvenlafaxine ER 50 MG Tb24 Commonly known as: PRISTIQ Take 1 tablet by mouth at bedtime. psych   docusate sodium 100 MG capsule Commonly known as: COLACE Take 200 mg by mouth 2 (two) times daily.   fenofibrate 145 MG tablet Commonly known as: TRICOR Take 1 tablet (145 mg total) by mouth daily. 4pm   ferrous sulfate 325 (65 FE) MG tablet Take 1 tablet (325 mg total) by mouth daily with breakfast.   gabapentin 300 MG capsule Commonly known as: NEURONTIN Take 300 mg by mouth at bedtime.   Gemtesa 75  MG Tabs Generic drug: Vibegron Take 75 mg by mouth daily. Started by: Debroah Loop, PA-C   Lantus SoloStar 100 UNIT/ML Solostar Pen Generic drug: insulin glargine Inject 24 Units into the skin daily.   Latuda 120 MG Tabs Generic drug: Lurasidone HCl Take 120 mg by mouth daily.   levothyroxine 175 MCG tablet Commonly known as: SYNTHROID Take 1 tablet (175 mcg total) by mouth daily before breakfast.   lisinopril 5 MG tablet Commonly known as: ZESTRIL Take 1 tablet (5 mg total) by mouth daily.   meloxicam 7.5 MG tablet Commonly known as: MOBIC Take 1 tablet (7.5 mg total) by mouth daily.   metoprolol succinate 100 MG 24 hr tablet Commonly known as: TOPROL-XL Take 1 tablet (100 mg total) by mouth daily. Take with or immediately following a meal.   omeprazole 40 MG capsule Commonly known as: PRILOSEC Take 1 capsule (40 mg total) by mouth daily.   ondansetron 4 MG tablet Commonly known as: ZOFRAN Take 4 mg by mouth 2 (two) times daily as needed for nausea or vomiting.   OVER THE COUNTER MEDICATION Take 1 Scoop by mouth daily in the afternoon. Collagen protein powder   oxyCODONE-acetaminophen 7.5-325 MG tablet Commonly known as: Percocet Take 1 tablet by mouth every 6 (six) hours as needed for severe pain.   PreserVision AREDS 2 Caps Take 1 capsule by mouth 2 (two) times daily.   Hair/Skin/Nails Tabs Take 1 tablet by mouth daily.   simvastatin 20 MG tablet Commonly known as: ZOCOR Take 1 tablet (20 mg total) by mouth daily.   tiZANidine 4 MG tablet Commonly known as: ZANAFLEX Take 2-4 mg by mouth every 8 (eight) hours as needed for muscle spasms.   traMADol 50 MG tablet Commonly known as: ULTRAM Take 50 mg by mouth every 6 (six) hours as needed.   traZODone 50 MG tablet Commonly known as: DESYREL Take 50 mg by mouth at bedtime. Take one tablet at bedtime/ psych   trimethoprim 100 MG tablet Commonly known as: TRIMPEX Take 1 tablet (100 mg total) by  mouth daily.   Trulicity 4.40 HK/7.4QV Sopn Generic drug: Dulaglutide Inject 0.75 mg into the skin once a week.   VITAMIN E (TOPICAL) Crea Apply 1 application. topically daily.   VITAMIN E (TOPICAL) Crea Apply topically.   zolpidem 5 MG tablet Commonly known as: AMBIEN Take 1 tablet (5 mg total) by mouth at bedtime as needed for sleep.        Allergies:  No Known Allergies  Family History: Family History  Problem Relation Age of Onset   Alzheimer's disease Mother    Alcohol abuse Father    Schizophrenia Father    Pancreatitis Father    Alcohol abuse Maternal Grandfather    Alzheimer's disease Maternal Grandmother  Social History:   reports that she quit smoking about 5 years ago. Her smoking use included cigarettes. She has never used smokeless tobacco. She reports that she does not currently use alcohol. She reports current drug use.  Physical Exam: BP 108/67   Pulse 64   Ht '5\' 5"'$  (1.651 m)   Wt 184 lb (83.5 kg)   BMI 30.62 kg/m   Constitutional:  Alert and oriented, no acute distress, nontoxic appearing HEENT: [place], AT Cardiovascular: No clubbing, cyanosis, or edema Respiratory: Normal respiratory effort, no increased work of breathing Skin: No rashes, bruises or suspicious lesions Neurologic: Grossly intact, no focal deficits, moving all 4 extremities Psychiatric: Normal mood and affect  Laboratory Data: Results for orders placed or performed in visit on 12/18/21  Microscopic Examination   Urine  Result Value Ref Range   WBC, UA 0-5 0 - 5 /hpf   RBC, Urine 0-2 0 - 2 /hpf   Epithelial Cells (non renal) 0-10 0 - 10 /hpf   Bacteria, UA None seen None seen/Few  Urinalysis, Complete  Result Value Ref Range   Specific Gravity, UA 1.020 1.005 - 1.030   pH, UA 7.0 5.0 - 7.5   Color, UA Yellow Yellow   Appearance Ur Clear Clear   Leukocytes,UA Negative Negative   Protein,UA Negative Negative/Trace   Glucose, UA Negative Negative   Ketones, UA Negative  Negative   RBC, UA Negative Negative   Bilirubin, UA Negative Negative   Urobilinogen, Ur 1.0 0.2 - 1.0 mg/dL   Nitrite, UA Negative Negative   Microscopic Examination See below:   BLADDER SCAN AMB NON-IMAGING  Result Value Ref Range   Scan Result 63m    Assessment & Plan:   1. Urinary urgency UA bland today.  We discussed that there can be overlap in symptoms between UTIs and OAB, but that OAB typically does not feature dysuria.  She agrees that she does not typically have dysuria with her lower abdominal pressure, urgency, and frequency.  I offered her a trial of Gemtesa and she accepted.  We will plan for symptom recheck and PVR in about 4 weeks. - Urinalysis, Complete - BLADDER SCAN AMB NON-IMAGING - Vibegron (GEMTESA) 75 MG TABS; Take 75 mg by mouth daily.  Dispense: 28 tablet; Refill: 0  Return in about 4 weeks (around 01/15/2022) for Symptom recheck with PVR.  SDebroah Loop PA-C  [facility] [address] S[place]kewville[place]ush [place] [phone]

## 2021-12-21 ENCOUNTER — Encounter: Payer: Self-pay | Admitting: Primary Care

## 2021-12-21 ENCOUNTER — Ambulatory Visit (INDEPENDENT_AMBULATORY_CARE_PROVIDER_SITE_OTHER): Payer: Medicare Other | Admitting: Primary Care

## 2021-12-21 VITALS — BP 100/60 | HR 62 | Temp 97.4°F | Ht 64.5 in | Wt 189.6 lb

## 2021-12-21 DIAGNOSIS — Z8669 Personal history of other diseases of the nervous system and sense organs: Secondary | ICD-10-CM

## 2021-12-21 DIAGNOSIS — G4730 Sleep apnea, unspecified: Secondary | ICD-10-CM

## 2021-12-21 NOTE — Progress Notes (Signed)
$'@Patient'G$  ID: Angela Fernandez, female    DOB: November 15, 1945, 76 y.o.   MRN: [id number]  Chief Complaint  Patient presents with   Consult    Referring provider: Norman Clay, MD  HPI: 76 year old female, former smoker quit in 2018. PMH significant for HTN, OSA, diabetes, hyperlipidemia.   12/21/2021 Patient presents today for sleep consult for hx sleep apnea. She had a previous sleep study in [place] approximately 12 years ago, results are not in chart. [facility] [place]. She was on CPAP but stopped wearing 1 year ago. She was experiencing a lot air leaks and getting a lot of noise from her machine which bothered her. She was compliant with use and does report benefit from wearing. She has lost approximately 40 lbs within the last year. She is sleeping better but still wakes up at night. She is unsure if she snores. He current machine is older than 5 years.  She has a lot of difficulty following instructions and using technology.  She will be attending occupational therapy to help her use a smart phone.  Denies symptoms of narcolepsy, cataplexy or sleepwalking.  Sleep questionnaire Symptoms-  Not falling asleep, waking up during the night , possible loud snoring, restless sleep, daytime sleepiness  Prior sleep study- [place] >12 years ago  Bedtime- 7-8:30pm Time to fall asleep- several hours Nocturnal awakenings- 3-4 times Out of bed/start of day- 7am  Weight changes- down 40 lbs Do you operate heavy machinery- No Do you currently wear CPAP- Not currently Do you current wear oxygen- No Epworth- 6 Medications- Ambien '5mg'$ , Trazodone '50mg'$ , Tramadol, Percocet, Latuda, Wellbutrin, Klonopin, Pristiq, neuronin    No Known Allergies  Immunization History  Administered Date(s) Administered   Influenza-Unspecified 03/10/2020, 02/15/2021   PFIZER(Purple Top)SARS-COV-2 Vaccination 07/13/2019, 08/03/2019, 02/16/2020, 08/08/2020, 02/15/2021   PNEUMOCOCCAL CONJUGATE-20 04/28/2021    Zoster Recombinat (Shingrix) 03/10/2020    Past Medical History:  Diagnosis Date   Anemia    Arthritis    Bipolar affect, depressed (HCC)    COPD (chronic obstructive pulmonary disease) (HCC)    Diabetes mellitus without complication (HCC)    type II   GERD (gastroesophageal reflux disease)    Hyperlipidemia    Hypertension    Hypothyroidism    PTSD (post-traumatic stress disorder)    Schizoaffective disorder ([place])    Sleep apnea    cpap   Stroke ([place])    hx of  mini stroke - 2001   TIA (transient ischemic attack) 2010   UTI (urinary tract infection)     Tobacco History: Social History   Tobacco Use  Smoking Status Former   Years: 54.00   Types: Cigarettes   Quit date: 05/10/2016   Years since quitting: 5.6  Smokeless Tobacco Never   Counseling given: Not Answered   Outpatient Medications Prior to Visit  Medication Sig Dispense Refill   albuterol (VENTOLIN HFA) 108 (90 Base) MCG/ACT inhaler Inhale 2 puffs into the lungs every 6 (six) hours as needed for wheezing or shortness of breath. 8 g 2   Aloe-Sodium Chloride (AYR SALINE NASAL GEL NA) Place 1 application. into the nose every other day.     buPROPion (WELLBUTRIN SR) 200 MG 12 hr tablet Take 200 mg by mouth 2 (two) times daily.     clonazePAM (KLONOPIN) 1 MG tablet Take 1 tablet (1 mg total) by mouth 3 (three) times daily as needed for anxiety. 90 tablet 2   Desvenlafaxine ER (PRISTIQ) 50 MG TB24  Take 1 tablet by mouth at bedtime. psych     docusate sodium (COLACE) 100 MG capsule Take 200 mg by mouth daily. 2 tabs in the am     Dulaglutide (TRULICITY) 2.29 NL/8.9QJ SOPN Inject 0.75 mg into the skin once a week. 3 mL 2   fenofibrate (TRICOR) 145 MG tablet Take 1 tablet (145 mg total) by mouth daily. 4pm 90 tablet 1   ferrous sulfate 325 (65 FE) MG tablet Take 1 tablet (325 mg total) by mouth daily with breakfast. 90 tablet 0   gabapentin (NEURONTIN) 300 MG capsule Take 300 mg by mouth at bedtime.     insulin  glargine (LANTUS SOLOSTAR) 100 UNIT/ML Solostar Pen Inject 24 Units into the skin daily.     levothyroxine (SYNTHROID) 175 MCG tablet Take 1 tablet (175 mcg total) by mouth daily before breakfast. 90 tablet 1   lisinopril (ZESTRIL) 5 MG tablet Take 1 tablet (5 mg total) by mouth daily. 90 tablet 1   Lurasidone HCl (LATUDA) 120 MG TABS Take 120 mg by mouth daily.     metoprolol succinate (TOPROL-XL) 100 MG 24 hr tablet Take 1 tablet (100 mg total) by mouth daily. Take with or immediately following a meal. 90 tablet 1   Multiple Vitamins-Minerals (HAIR/SKIN/NAILS) TABS Take 1 tablet by mouth daily.     Multiple Vitamins-Minerals (PRESERVISION AREDS 2) CAPS Take 1 capsule by mouth 2 (two) times daily.     omeprazole (PRILOSEC) 40 MG capsule Take 1 capsule (40 mg total) by mouth daily. 90 capsule 1   OVER THE COUNTER MEDICATION Take 1 Scoop by mouth daily in the afternoon. Collagen protein powder     oxyCODONE-acetaminophen (PERCOCET) 7.5-325 MG tablet Take 1 tablet by mouth every 6 (six) hours as needed for severe pain. 20 tablet 0   simvastatin (ZOCOR) 20 MG tablet Take 1 tablet (20 mg total) by mouth daily. 90 tablet 1   tiZANidine (ZANAFLEX) 4 MG tablet Take 2-4 mg by mouth every 8 (eight) hours as needed for muscle spasms.     traMADol (ULTRAM) 50 MG tablet Take 50 mg by mouth every 6 (six) hours as needed.     traZODone (DESYREL) 50 MG tablet Take 50 mg by mouth at bedtime. Take one tablet at bedtime/ psych     trimethoprim (TRIMPEX) 100 MG tablet Take 1 tablet (100 mg total) by mouth daily. 30 tablet 11   umeclidinium-vilanterol (ANORO ELLIPTA) 62.5-25 MCG/ACT AEPB Inhale 2 puffs into the lungs daily as needed (shortness of breath).     Vibegron (GEMTESA) 75 MG TABS Take 75 mg by mouth daily. 28 tablet 0   VITAMIN E, TOPICAL, CREA Apply 1 application. topically daily.     zolpidem (AMBIEN) 5 MG tablet Take 1 tablet (5 mg total) by mouth at bedtime as needed for sleep. 30 tablet 2   amoxicillin  (AMOXIL) 500 MG capsule Take by mouth. (Patient not taking: Reported on 12/21/2021)     desvenlafaxine (PRISTIQ) 50 MG 24 hr tablet Take 1 tablet by mouth at bedtime. (Patient not taking: Reported on 12/21/2021)     meloxicam (MOBIC) 7.5 MG tablet Take 1 tablet (7.5 mg total) by mouth daily. (Patient not taking: Reported on 12/21/2021) 90 tablet 0   ondansetron (ZOFRAN) 4 MG tablet Take 4 mg by mouth 2 (two) times daily as needed for nausea or vomiting. (Patient not taking: Reported on 12/21/2021)     VITAMIN E, TOPICAL, CREA Apply topically. (Patient not taking: Reported on 12/21/2021)  No facility-administered medications prior to visit.   Review of Systems  Review of Systems  Constitutional:  Positive for fatigue.  HENT: Negative.    Respiratory: Negative.    Cardiovascular: Negative.   Psychiatric/Behavioral:  Positive for sleep disturbance.     Physical Exam  BP 100/60 (BP Location: Left Arm, Patient Position: Sitting, Cuff Size: Large)   Pulse 62   Temp (!) 97.4 F (36.3 C) (Oral)   Ht 5' 4.5" (1.638 m)   Wt 189 lb 9.6 oz (86 kg)   SpO2 98%   BMI 32.04 kg/m  Physical Exam Constitutional:      Appearance: Normal appearance.  HENT:     Head: Normocephalic and atraumatic.     Mouth/Throat:     Mouth: Mucous membranes are moist.     Pharynx: Oropharynx is clear.     Comments: Mallampati class II Cardiovascular:     Rate and Rhythm: Normal rate and regular rhythm.  Pulmonary:     Effort: Pulmonary effort is normal.     Breath sounds: Normal breath sounds.  Musculoskeletal:     Cervical back: Normal range of motion and neck supple.  Skin:    General: Skin is warm and dry.  Neurological:     General: No focal deficit present.     Mental Status: She is alert and oriented to person, place, and time. Mental status is at baseline.  Psychiatric:        Mood and Affect: Mood normal.        Behavior: Behavior normal.        Thought Content: Thought content normal.         Judgment: Judgment normal.      Lab Results:  CBC    Component Value Date/Time   WBC 10.4 10/12/2021 1027   RBC 3.85 (L) 10/12/2021 1027   HGB 10.1 (L) 10/25/2021 0252   HCT 30.3 (L) 10/25/2021 0252   PLT 304 10/12/2021 1027   MCV 98.7 10/12/2021 1027   MCH 31.7 10/12/2021 1027   MCHC 32.1 10/12/2021 1027   RDW 12.9 10/12/2021 1027   LYMPHSABS 6.6 (H) 08/21/2021 1655   MONOABS 0.6 08/21/2021 1655   EOSABS 0.3 08/21/2021 1655   BASOSABS 0.1 08/21/2021 1655    BMET    Component Value Date/Time   NA 138 10/24/2021 0314   NA 140 02/03/2021 0000   K 3.6 10/24/2021 0314   CL 107 10/24/2021 0314   CO2 26 10/24/2021 0314   GLUCOSE 158 (H) 10/24/2021 0314   BUN 23 10/24/2021 0314   BUN 33 (A) 02/03/2021 0000   CREATININE 0.90 10/24/2021 0314   CALCIUM 8.7 (L) 10/24/2021 0314   GFRNONAA >60 10/24/2021 0314   GFRAA 64 04/28/2020 1100    BNP No results found for: "BNP"  ProBNP No results found for: "PROBNP"  Imaging: Ultrasound renal complete  Result Date: 11/24/2021 CLINICAL DATA:  UTI EXAM: RENAL / URINARY TRACT ULTRASOUND COMPLETE COMPARISON:  None Available. FINDINGS: Right Kidney: Renal measurements: 11.0 x 4.8 x 5.2 cm = volume: 148 mL. Echogenicity within normal limits. No mass or hydronephrosis visualized. Left Kidney: Renal measurements: 10.8 x 5.7 x 4.6 cm = volume: 147 mL. Echogenicity within normal limits. No mass or hydronephrosis visualized. Bladder: Appears normal for degree of bladder distention. Other: None. IMPRESSION: No abnormalities are identified. Electronically Signed   By: Dorise Bullion III M.D.   On: 11/24/2021 15:57     Assessment & Plan:   Hx of sleep  apnea - Patient has a history of sleep apnea.  Previous sleep study was done in [place] over 12 years ago.  She stopped wearing CPAP last year.  She has lost 40 pounds in the last year, however, continues to have symptoms of insomnia, restless sleep and daytime sleepiness.  Her Epworth score is  6.  She is on several psych and sleep medications.  Suspect patient still has diagnosis of underlying sleep apnea, she needs repeat sleep study to assess severity.  Recommending patient have polysomnography sleep study in lab, she will not be able to do home sleep study test due to difficulty following instructions.  We reviewed risks of untreated sleep apnea including A-fib, pulmonary hypertension, stroke and diabetes.  We reviewed treatment options including weight loss, oral appliance, CPAP therapy or referral to ENT for possible surgical options.  Encourage side sleeping position and continued weight loss efforts. Advised against driving if experiencing excessive daytime sleepiness. She is open to resuming CPAP if needed. Follow-up in 6 weeks with Beth NP.   45 mins spent on case: > 50% face to face with patient   Martyn Ehrich, NP 12/21/2021

## 2021-12-21 NOTE — Assessment & Plan Note (Addendum)
-   Patient has a history of sleep apnea.  Previous sleep study was done in [place] over 12 years ago.  She stopped wearing CPAP last year.  She has lost 40 pounds in the last year, however, continues to have symptoms of insomnia, restless sleep and daytime sleepiness.  Her Epworth score is 6.  She is on several psych and sleep medications.  Suspect patient still has diagnosis of underlying sleep apnea, she needs repeat sleep study to assess severity.  Recommending patient have polysomnography sleep study in lab, she will not be able to do home sleep study test due to difficulty following instructions.  We reviewed risks of untreated sleep apnea including A-fib, pulmonary hypertension, stroke and diabetes.  We reviewed treatment options including weight loss, oral appliance, CPAP therapy or referral to ENT for possible surgical options.  Encourage side sleeping position and continued weight loss efforts. Advised against driving if experiencing excessive daytime sleepiness. She is open to resuming CPAP if needed. Follow-up in 6 weeks with [facility] NP.

## 2021-12-21 NOTE — Patient Instructions (Addendum)
  Sleep apnea is defined as period of 10 seconds or longer when you stop breathing at night. This can happen multiple times a night. Dx sleep apnea is when this occurs more than 5 times an hour.    Mild OSA 5-15 apneic events an hour Moderate OSA 15-30 apneic events an hour Severe OSA > 30 apneic events an hour   Untreated sleep apnea puts you at higher risk for cardiac arrhythmias, pulmonary HTN, stroke and diabetes   Treatment options include weight loss, side sleeping position, oral appliance, CPAP therapy or referral to ENT for possible surgical options    Recommendations: Focus on side sleeping position Continue to work on weight loss efforts if able  Do not drive if experiencing excessive daytime sleepiness of fatigue    Orders: NPSG re: sleep apnea (they will cal to schedule testing date/time)   Follow-up: 6-8 weeks with Eustaquio Maize NP

## 2021-12-22 ENCOUNTER — Ambulatory Visit: Payer: Medicare Other

## 2021-12-22 DIAGNOSIS — M25511 Pain in right shoulder: Secondary | ICD-10-CM | POA: Diagnosis not present

## 2021-12-22 DIAGNOSIS — M25619 Stiffness of unspecified shoulder, not elsewhere classified: Secondary | ICD-10-CM | POA: Diagnosis not present

## 2021-12-22 DIAGNOSIS — M6281 Muscle weakness (generalized): Secondary | ICD-10-CM

## 2021-12-22 DIAGNOSIS — M542 Cervicalgia: Secondary | ICD-10-CM | POA: Diagnosis not present

## 2021-12-22 DIAGNOSIS — F259 Schizoaffective disorder, unspecified: Secondary | ICD-10-CM | POA: Diagnosis not present

## 2021-12-22 DIAGNOSIS — Z96611 Presence of right artificial shoulder joint: Secondary | ICD-10-CM

## 2021-12-22 DIAGNOSIS — M5441 Lumbago with sciatica, right side: Secondary | ICD-10-CM | POA: Diagnosis not present

## 2021-12-22 DIAGNOSIS — M5412 Radiculopathy, cervical region: Secondary | ICD-10-CM | POA: Diagnosis not present

## 2021-12-22 DIAGNOSIS — G8929 Other chronic pain: Secondary | ICD-10-CM | POA: Diagnosis not present

## 2021-12-22 DIAGNOSIS — M48062 Spinal stenosis, lumbar region with neurogenic claudication: Secondary | ICD-10-CM | POA: Diagnosis not present

## 2021-12-22 DIAGNOSIS — M9931 Osseous stenosis of neural canal of cervical region: Secondary | ICD-10-CM | POA: Diagnosis not present

## 2021-12-22 DIAGNOSIS — R262 Difficulty in walking, not elsewhere classified: Secondary | ICD-10-CM | POA: Diagnosis not present

## 2021-12-22 DIAGNOSIS — M5442 Lumbago with sciatica, left side: Secondary | ICD-10-CM | POA: Diagnosis not present

## 2021-12-22 DIAGNOSIS — R29898 Other symptoms and signs involving the musculoskeletal system: Secondary | ICD-10-CM | POA: Diagnosis not present

## 2021-12-22 NOTE — Therapy (Signed)
OUTPATIENT PHYSICAL THERAPY SHOULDER TREATMENT   Patient Name: Angela Fernandez MRN: [id number] DOB:January 31, 1946, 76 y.o., female Today's Date: 12/22/2021   PT End of Session - 12/22/21 1302     Visit Number 4    Number of Visits 25    Date for PT Re-Evaluation 02/26/22    Authorization Time Period Initial PT cert 8/34/1962- 22/97/9892    Progress Note Due on Visit 10    PT Start Time 1194    PT Stop Time 1342    PT Time Calculation (min) 44 min    Activity Tolerance Patient tolerated treatment well    Behavior During Therapy WFL for tasks assessed/performed              Past Medical History:  Diagnosis Date   Anemia    Arthritis    Bipolar affect, depressed ([place])    COPD (chronic obstructive pulmonary disease) ([place])    Diabetes mellitus without complication ([place])    type II   GERD (gastroesophageal reflux disease)    Hyperlipidemia    Hypertension    Hypothyroidism    PTSD (post-traumatic stress disorder)    Schizoaffective disorder ([employer])    Sleep apnea    cpap   Stroke ([place])    hx of  mini stroke - 2001   TIA (transient ischemic attack) 2010   UTI (urinary tract infection)    Past Surgical History:  Procedure Laterality Date   BILATERAL CARPAL TUNNEL RELEASE Bilateral    BREAST BIOPSY     BREAST SURGERY Right    lumpectomy   CATARACT EXTRACTION W/ INTRAOCULAR LENS  IMPLANT, BILATERAL Bilateral    CHOLECYSTECTOMY     COLONOSCOPY     ESOPHAGOGASTRODUODENOSCOPY     EYE SURGERY     JOINT REPLACEMENT     NASAL SINUS SURGERY     REPLACEMENT TOTAL KNEE BILATERAL Bilateral    REVERSE SHOULDER ARTHROPLASTY Right 09/02/2020   Procedure: REVERSE SHOULDER ARTHROPLASTY;  Surgeon: Corky Mull, MD;  Location: [facility] ORS;  Service: Orthopedics;  Laterality: Right;   SHOULDER SURGERY Right 2020   rotator cuff repair   TOTAL SHOULDER REVISION Right 10/23/2021   Procedure: REVERSE SHOULDER REVISION;  Surgeon: Nicholes Stairs, MD;  Location: [facility];  Service:  Orthopedics;  Laterality: Right;  180   Patient Active Problem List   Diagnosis Date Noted   Hx of sleep apnea 12/21/2021   S/P reverse total shoulder arthroplasty, right 10/23/2021   Cystocele with rectocele 04/10/2021   Mixed urinary incontinence due to female genital prolapse 04/10/2021   Submucous leiomyoma of uterus 04/10/2021   Thickened endometrium 04/10/2021   Pelvic pressure in female 02/11/2021   DJD (degenerative joint disease) of cervical spine 12/28/2020   Anxiety, generalized 12/28/2020   History of depression 12/28/2020   Memory difficulty 12/28/2020   Chronic venous insufficiency 12/24/2020   Lymphedema 12/24/2020   Diabetes ([place]) 12/24/2020   Hyperlipidemia 12/24/2020   Essential hypertension 12/24/2020   Closed fracture of neck of right scapula 10/17/2020   Status post reverse total shoulder replacement, right 09/02/2020   Rotator cuff arthropathy, right 07/14/2020    PCP: Adrian Prows  REFERRING PROVIDER: Dr. Victorino December ([facility])  REFERRING DIAG: s/p Right Reverse Total shoulder arthroplasty (REVISION) 10/23/2021  THERAPY DIAG:  Muscle weakness (generalized)  Right shoulder pain, unspecified chronicity  Limited range of motion (ROM) of shoulder  S/P reverse total shoulder arthroplasty, right  Schizoaffective disorder, unspecified type ([place])  Rationale for Evaluation and Treatment Rehabilitation  ONSET DATE: 10/23/2021  SUBJECTIVE:     Patient reports doing okay. Having some issues at home with doing laundry and reports only minimal soreness after last visit.                                                                                                                                                                                   PERTINENT HISTORY: History taken per MD note on 6/16 Victorino December, MD)  Angela Fernandez is a 76 y.o. female who complains of right shoulder pain and dysfunction following a elective reverse arthroplasty  performed over in [place] a number of months ago.  She had early failure of the baseplate with loosening and now malposition  PAIN:  Are you having pain? Yes: NPRS scale: 4/10 Pain location: superior and ant shoulder Pain description: ache mostly; but sharp with movement Aggravating factors: active movement; reaching Relieving factors: Rest; Ice; tramadol  PRECAUTIONS: Shoulder- Left VM at [facility] - requesting any protocol faxed to [facility] at [phone]   PATIENT GOALS  I want to be able to reach the top of head and also paint as well  OBJECTIVE:   TODAY'S TREATMENT:   Manual Therapy:   PROM R shoulder - Flex, ABD, Scaption, ER/IR x several minutes-  Grade 2 PA/AP/Inf GH glides x 30 bouts in varying ROM positions Supine GHJ inferior distraction for ROM & pain relief Supine cervical Rotation L/R- Hold 30 sec x 3 each Supine suboccipital release technique x 60 sec x 2 to decrease cervicalgia Supine UT/Levator stretch, 30 sec bouts to increase tissue extensibility of the cervical region  *patient reported feeling better and less stiff after stretching.   THEREX:  Scaption AROM 0-75 deg 2 sets of 10 reps Supine AAROM ER using PVC pipe- 2 sets of 10 reps Supine Resistive ER Supine IR- 2 sets of 10 reps Supine serratus punch with 2# dumbbell x 10 reps - VC for technique  Standing shoulder shrugs with 4# dumbbells each UE, 2x15 Standing scapular retractions with RTB, 2x15 Seated bicep curls with 2# weight, 2x15 each UE Standing scaption AROM x15 reps   PATIENT EDUCATION: Education details: Exercise technique.  Person educated: Patient Education method: Explanation, Demonstration, Tactile cues, and Verbal cues Education comprehension: verbalized understanding, returned demonstration, verbal cues required, tactile cues required, and needs further education   HOME EXERCISE PROGRAM: Access Code: [email or web address] URL: [email or web address] Date:  12/16/2021 Prepared by: Sande Brothers  Exercises - Supine Shoulder Flexion AAROM with Hands Clasped  - 1 x daily - 3 sets - 10 reps - Supine Shoulder Flexion Extension Full Range AROM  - 1 x daily - 3 sets - 10 reps -  Supine Shoulder Internal Rotation  - 1 x daily - 3 sets - 10 reps - Supine Shoulder External Rotation Stretch  - 1 x daily - 3 sets - 10 reps - Supine Scapular Retraction  - 1 x daily - 3 sets - 10 reps  Reviewed active elbow flex/ext/forearm sup/pronation/wrist ext/flex  Ice pack to right shoulder (unbilled) in supine after session for pain relief/soreness.   ASSESSMENT:  CLINICAL IMPRESSION:   Patient presents with good response to PT today and able to progress with closer to full PROM R shoulder and no significant difficulty with progression of therex today. Pt will benefit from PT services to address deficits in ROM, strength, mobility, and pain in order to return to full function at home with less shoulder pain.   OBJECTIVE IMPAIRMENTS decreased activity tolerance, decreased mobility, decreased ROM, decreased strength, decreased safety awareness, hypomobility, impaired flexibility, impaired UE functional use, postural dysfunction, and pain.   ACTIVITY LIMITATIONS carrying, lifting, sleeping, bathing, dressing, reach over head, and hygiene/grooming  PARTICIPATION LIMITATIONS: meal prep, cleaning, laundry, driving, shopping, community activity, and yard work  PERSONAL FACTORS Behavior pattern and 1-2 comorbidities: COPD, Mult shoulder surgeries  are also affecting patient's functional outcome.   REHAB POTENTIAL: Good  CLINICAL DECISION MAKING: Evolving/moderate complexity  EVALUATION COMPLEXITY: Low   GOALS: Goals reviewed with patient? Yes  SHORT TERM GOALS: Target date: 02/02/2022  (Remove Blue Hyperlink)  Pt will be independent with initial HEP in order to improve strength and decrease pain in order to improve pain-free function at home and  work. Baseline: 12/04/2021- No formal HEP in place Goal status: INITIAL  LONG TERM GOALS: Target date: 03/16/2022  (Remove Blue Hyperlink)  Pt will be independent with Final HEP in order to improve strength and decrease pain in order to improve pain-free function at home and work. Baseline: 12/04/2021- No formal HEP in place Goal status: INITIAL  2.  Pt will decrease quick DASH score by at least 8% in order to demonstrate clinically significant reduction in disability. Baseline: 12/04/2021= 61 Goal status: INITIAL  3.  Pt will improve FOTO to target score of 51% to display perceived improvements in ability to complete ADL's.  Baseline: 12/04/2021=40 Goal status: INITIAL  4.  Pt will decrease worst pain as reported on NPRS by at least 3 points in order to demonstrate clinically significant reduction in pain. Baseline: 12/04/2021= 6/10 Right shoulder pain Goal status: INITIAL  5.  Patient will demo > 140 deg of right shoulder elevation (PROM) for improved ROM and overhead activities Baseline: 12/04/2021=PROM R shoulder flex- 118; 105 ABD Goal status: INITIAL  6.  Patient will demo > 100 deg of right shoulder elevation (AROM) for improved ROM and overhead activities Baseline: 12/04/2021=PROM R shoulder flex- 118; 105 ABD Goal status: INITIAL   PLAN: PT FREQUENCY: 2x/week  PT DURATION: 12 weeks  PLANNED INTERVENTIONS: Therapeutic exercises, Therapeutic activity, Patient/Family education, Self Care, Joint mobilization, Dry Needling, Spinal manipulation, Spinal mobilization, Cryotherapy, Moist heat, scar mobilization, Taping, Ultrasound, and Manual therapy  PLAN FOR NEXT SESSION: Manual Therapy for ROM and advance HEP as appropriate.    Ollen Bowl, PT 12/22/21, 1:49 PM

## 2021-12-23 ENCOUNTER — Ambulatory Visit (INDEPENDENT_AMBULATORY_CARE_PROVIDER_SITE_OTHER): Payer: Medicare Other | Admitting: Acute Care

## 2021-12-23 ENCOUNTER — Encounter: Payer: Self-pay | Admitting: Acute Care

## 2021-12-23 DIAGNOSIS — Z87891 Personal history of nicotine dependence: Secondary | ICD-10-CM

## 2021-12-23 NOTE — Patient Instructions (Signed)
Thank you for participating in the [facility]. It was our pleasure to meet you today. We will call you with the results of your scan within the next few days. Your scan will be assigned a Lung RADS category score by the physicians reading the scans.  This Lung RADS score determines follow up scanning.  See below for description of categories, and follow up screening recommendations. We will be in touch to schedule your follow up screening annually or based on recommendations of our providers. We will fax a copy of your scan results to your Primary Care Physician, or the physician who referred you to the program, to ensure they have the results. Please call the office if you have any questions or concerns regarding your scanning experience or results.  Our office number is [phone]. Please speak with Denise Phelps, RN. , or  Denise Buckner RN, They are  our Lung Cancer Screening RN.'s If They are unavailable when you call, Please leave a message on the voice mail. We will return your call at our earliest convenience.This voice mail is monitored several times a day.  Remember, if your scan is normal, we will scan you annually as long as you continue to meet the criteria for the program. (Age 55-77, Current smoker or smoker who has quit within the last 15 years). If you are a smoker, remember, quitting is the single most powerful action that you can take to decrease your risk of lung cancer and other pulmonary, breathing related problems. We know quitting is hard, and we are here to help.  Please let us know if there is anything we can do to help you meet your goal of quitting. If you are a former smoker, congratulations. We are proud of you! Remain smoke free! Remember you can refer friends or family members through the number above.  We will screen them to make sure they meet criteria for the program. Thank you for helping us take better care of you by  participating in Lung Screening.  You can receive free nicotine replacement therapy ( patches, gum or mints) by calling 1-800-QUIT NOW. Please call so we can get you on the path to becoming  a non-smoker. I know it is hard, but you can do this!  Lung RADS Categories:  Lung RADS 1: no nodules or definitely non-concerning nodules.  Recommendation is for a repeat annual scan in 12 months.  Lung RADS 2:  nodules that are non-concerning in appearance and behavior with a very low likelihood of becoming an active cancer. Recommendation is for a repeat annual scan in 12 months.  Lung RADS 3: nodules that are probably non-concerning , includes nodules with a low likelihood of becoming an active cancer.  Recommendation is for a 6-month repeat screening scan. Often noted after an upper respiratory illness. We will be in touch to make sure you have no questions, and to schedule your 6-month scan.  Lung RADS 4 A: nodules with concerning findings, recommendation is most often for a follow up scan in 3 months or additional testing based on our provider's assessment of the scan. We will be in touch to make sure you have no questions and to schedule the recommended 3 month follow up scan.  Lung RADS 4 B:  indicates findings that are concerning. We will be in touch with you to schedule additional diagnostic testing based on our provider's  assessment of the scan.  Other options for assistance in smoking cessation (   As covered by your insurance benefits)  Hypnosis for smoking cessation  [employer] [phone]  Acupuncture for smoking cessation  [facility] [phone]   

## 2021-12-23 NOTE — Progress Notes (Signed)
Virtual Visit via Telephone Note  I connected with Ree Shay on 12/23/21 at 11:30 AM EDT by telephone and verified that I am speaking with the correct person using two identifiers.  Location: Patient:  At home Provider:  [place], [place], [place], [address]    I discussed the limitations, risks, security and privacy concerns of performing an evaluation and management service by telephone and the availability of in person appointments. I also discussed with the patient that there may be a patient responsible charge related to this service. The patient expressed understanding and agreed to proceed.   Shared Decision Making Visit Lung Cancer Screening Program [phone])   Eligibility: Age 76 y.o. Pack Years Smoking History Calculation 54 pack year smoking history (# packs/per year x # years smoked) Recent History of coughing up blood  no Unexplained weight loss? no ( >Than 15 pounds within the last 6 months ) Prior History Lung / other cancer no (Diagnosis within the last 5 years already requiring surveillance chest CT Scans). Smoking Status Former Smoker Former Smokers: Years since quit: 2 years  Quit Date: 2021  Visit Components: Discussion included one or more decision making aids. yes Discussion included risk/benefits of screening. yes Discussion included potential follow up diagnostic testing for abnormal scans. yes Discussion included meaning and risk of over diagnosis. yes Discussion included meaning and risk of False Positives. yes Discussion included meaning of total radiation exposure. yes  Counseling Included: Importance of adherence to annual lung cancer LDCT screening. yes Impact of comorbidities on ability to participate in the program. yes Ability and willingness to under diagnostic treatment. yes  Smoking Cessation Counseling: Current Smokers:  Discussed importance of smoking cessation. yes Information about tobacco cessation classes and  interventions provided to patient. yes Patient provided with "ticket" for LDCT Scan. yes Symptomatic Patient. no  Counseling NA Diagnosis Code: Tobacco Use Z72.0 Asymptomatic Patient yes  Counseling (Intermediate counseling: > three minutes counseling) P9150 Former Smokers:  Discussed the importance of maintaining cigarette abstinence. yes Diagnosis Code: Personal History of Nicotine Dependence. V69.794 Information about tobacco cessation classes and interventions provided to patient. Yes Patient provided with "ticket" for LDCT Scan. yes Written Order for Lung Cancer Screening with LDCT placed in Epic. Yes (CT Chest Lung Cancer Screening Low Dose W/O CM) [id number] Z12.2-Screening of respiratory organs Z87.891-Personal history of nicotine dependence  I spent 25 minutes of face to face time/virtual visit time  with  Ms. Litzinger discussing the risks and benefits of lung cancer screening. We took the time to pause the power point at intervals to allow for questions to be asked and answered to ensure understanding. We discussed that sge had taken the single most powerful action possible to decrease her risk of developing lung cancer when she quit smoking. I counseled her to remain smoke free, and to contact me if she ever had the desire to smoke again so that I can provide resources and tools to help support the effort to remain smoke free. We discussed the time and location of the scan, and that either  Doroteo Glassman RN, Joella Prince, RN or I  or I will call / send a letter with the results within  24-72 hours of receiving them. She has the office contact information in the event she needs to speak with me,  she verbalized understanding of all of the above and had no further questions upon leaving the office.     I explained to the patient that there has been  a high incidence of coronary artery disease noted on these exams. I explained that this is a non-gated exam therefore degree or severity cannot  be determined. This patient is on statin therapy. I have asked the patient to follow-up with their PCP regarding any incidental finding of coronary artery disease and management with diet or medication as they feel is clinically indicated. The patient verbalized understanding of the above and had no further questions.     Magdalen Spatz, NP 12/23/2021

## 2021-12-24 ENCOUNTER — Ambulatory Visit: Payer: Medicare Other

## 2021-12-24 ENCOUNTER — Ambulatory Visit: Payer: Medicare Other | Admitting: Occupational Therapy

## 2021-12-24 ENCOUNTER — Telehealth: Payer: Self-pay | Admitting: Primary Care

## 2021-12-24 ENCOUNTER — Encounter: Payer: Self-pay | Admitting: Occupational Therapy

## 2021-12-24 DIAGNOSIS — R262 Difficulty in walking, not elsewhere classified: Secondary | ICD-10-CM | POA: Diagnosis not present

## 2021-12-24 DIAGNOSIS — M6281 Muscle weakness (generalized): Secondary | ICD-10-CM

## 2021-12-24 DIAGNOSIS — M25619 Stiffness of unspecified shoulder, not elsewhere classified: Secondary | ICD-10-CM | POA: Diagnosis not present

## 2021-12-24 DIAGNOSIS — M25511 Pain in right shoulder: Secondary | ICD-10-CM | POA: Diagnosis not present

## 2021-12-24 DIAGNOSIS — F259 Schizoaffective disorder, unspecified: Secondary | ICD-10-CM | POA: Diagnosis not present

## 2021-12-24 DIAGNOSIS — Z96611 Presence of right artificial shoulder joint: Secondary | ICD-10-CM | POA: Diagnosis not present

## 2021-12-24 NOTE — Telephone Encounter (Signed)
This is a Counselling psychologist.  Rodena Piety will call the pt & explain their procedure.  Their sleep lab obtains their prior auth & contact the pt themselves to schedule.  Nothing further needed at this time.

## 2021-12-24 NOTE — Therapy (Signed)
OUTPATIENT OCCUPATIONAL THERAPY TREATMENT  Patient Name: Angela Fernandez MRN: [id number] DOB:1946-03-13, 76 y.o., female Today's Date: 12/24/2021  PCP: Dr. Leonel Ramsay REFERRING PROVIDER: Dr. Norman Clay   OT End of Session - 12/24/21 1646     Visit Number 7    Number of Visits 12    Date for OT Re-Evaluation 02/04/22    OT Start Time 23    OT Stop Time 1640    OT Time Calculation (min) 40 min    Activity Tolerance Patient tolerated treatment well    Behavior During Therapy WFL for tasks assessed/performed             Past Medical History:  Diagnosis Date   Anemia    Arthritis    Bipolar affect, depressed ([place])    COPD (chronic obstructive pulmonary disease) ([place])    Diabetes mellitus without complication ([place])    type II   GERD (gastroesophageal reflux disease)    Hyperlipidemia    Hypertension    Hypothyroidism    PTSD (post-traumatic stress disorder)    Schizoaffective disorder ([place])    Sleep apnea    cpap   Stroke ([place])    hx of  mini stroke - 2001   TIA (transient ischemic attack) 2010   UTI (urinary tract infection)    Past Surgical History:  Procedure Laterality Date   BILATERAL CARPAL TUNNEL RELEASE Bilateral    BREAST BIOPSY     BREAST SURGERY Right    lumpectomy   CATARACT EXTRACTION W/ INTRAOCULAR LENS  IMPLANT, BILATERAL Bilateral    CHOLECYSTECTOMY     COLONOSCOPY     ESOPHAGOGASTRODUODENOSCOPY     EYE SURGERY     JOINT REPLACEMENT     NASAL SINUS SURGERY     REPLACEMENT TOTAL KNEE BILATERAL Bilateral    REVERSE SHOULDER ARTHROPLASTY Right 09/02/2020   Procedure: REVERSE SHOULDER ARTHROPLASTY;  Surgeon: Corky Mull, MD;  Location: [facility] ORS;  Service: Orthopedics;  Laterality: Right;   SHOULDER SURGERY Right 2020   rotator cuff repair   TOTAL SHOULDER REVISION Right 10/23/2021   Procedure: REVERSE SHOULDER REVISION;  Surgeon: Nicholes Stairs, MD;  Location: [facility];  Service: Orthopedics;  Laterality: Right;  180   Patient  Active Problem List   Diagnosis Date Noted   Hx of sleep apnea 12/21/2021   S/P reverse total shoulder arthroplasty, right 10/23/2021   Cystocele with rectocele 04/10/2021   Mixed urinary incontinence due to female genital prolapse 04/10/2021   Submucous leiomyoma of uterus 04/10/2021   Thickened endometrium 04/10/2021   Pelvic pressure in female 02/11/2021   DJD (degenerative joint disease) of cervical spine 12/28/2020   Anxiety, generalized 12/28/2020   History of depression 12/28/2020   Memory difficulty 12/28/2020   Chronic venous insufficiency 12/24/2020   Lymphedema 12/24/2020   Diabetes ([place]) 12/24/2020   Hyperlipidemia 12/24/2020   Essential hypertension 12/24/2020   Closed fracture of neck of right scapula 10/17/2020   Status post reverse total shoulder replacement, right 09/02/2020   Rotator cuff arthropathy, right 07/14/2020    ONSET DATE: 10/13/21 (referral date)  REFERRING DIAG: Cognitive decline, schizoaffective disorder, unspecified type  THERAPY DIAG:  Muscle weakness (generalized)  Rationale for Evaluation and Treatment Rehabilitation  SUBJECTIVE: Pt. Reports that it won't be long now until she moves in with her granddaughter.  Pt accompanied by: self  PERTINENT HISTORY: Per chart, Schizoaffective disorder, PTSD, anxiety, depression, insomnia, sleep apnea, occasional passive SI, occasional AH (voices); Recent R reverse TSA on 10/23/21.  Per pt, she moved from [place], approximately 1 year and 4-5 months ago to move in with her daughter and granddaughter.  Pt reports she is actively looking for a house to move out, with plans to have granddaughter live with her.  Pt reports being eager to be more indep in her living situation.  PAIN:  Are you having pain? Pt reports moderate pain in R shoulder with activity, which occasionally limits her driving.     PATIENT GOALS : to move out and live more independently, possibly with granddaughter living with her.     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    PATIENT EDUCATION: Education details: organizational strategies for bill paying Person educated: Patient Education method: Explanation, demonstration, written handout Education comprehension: verbalized understanding GOALS: Goals reviewed with patient? Yes  SHORT TERM GOALS: Target date: 12/24/21    Pt will utilize visual reminders to independently identify emergency numbers and frequent contacts (ie family members, MD offices) to call when in need of assistance. Baseline: Pt was unsure whether "911" was the correct number to call for a house fire and reported being unable to search for information on her phone to identify people/places. Goal status: INITIAL  2.  Pt will be indep to address an envelope for mailing a bill or letter. Baseline: Pt forgetful to her zip code and left off the PO box number on a simulated utility bill. Goal status: INITIAL  3. Pt will complete Pill Box Assessment Test to determine level of assist needed for medication management.  Baseline: Not yet completed.  Goal status: Initial  4. Pt will develop organizational system (Ie: calendar/chart) to identify when monthly bills are due for timely bill paying.  Baseline: Not yet initiated.  Goal status: Initial   LONG TERM GOALS: Target date: 02/04/22    Pt will write checks for monthly bills with min supv for accuracy and completion.  Baseline: Daughter currently manages bills. Goal status: INITIAL  2.  Pt will manage medications with mod A. (May revise supervision level following completion of Pill Box assessment) Baseline: Daughter currently manages medications Goal status: INITIAL  3.  Pt will be indep to utilize smart  phone to look up desired information for increasing community engagement and socialization (locations and phone numbers for [employer], movie theaters, parks) Baseline: Pt reports she does not know how to look up information on her smart phone (Brief instruction given at eval but additional training needed). Goal status: INITIAL  4.  Pt will demo indep with use of map quest/google maps or a like app to increase indep with community mobility. Baseline: Pt can drive to a few familiar locations (granddaughter's place of work, MD offices), but otherwise does not know how to utilize phone to access new locations. Goal status: INITIAL  ASSESSMENT:  CLINICAL IMPRESSION:  Pt. reports that she is starting to feel more confident with navigating her cellphone, and accessing the internet for business website searches. Pt. Continues to report that she really needs to find a  car repair shop so that she can have her car fixed. Pt. required fewer cues, and assist for navigating the her phone to the Google search page, however required cues for entering the search information, and scrolling through the search options.  Pt. Continues to required step by cues, assistance, and repetition for carry over of new information for  options  to sharing website information with text contacts, and accessing google maps.   PERFORMANCE DEFICITS in functional skills including IADLs and pain, cognitive skills including memory and information processing , and psychosocial skills including environmental adaptation and interpersonal interactions.   IMPAIRMENTS are limiting patient from IADLs, leisure, and social participation.   COMORBIDITIES may have co-morbidities  that affects occupational performance. Patient will benefit from skilled OT to address above impairments and improve overall function.  MODIFICATION OR ASSISTANCE TO COMPLETE EVALUATION: Min-Moderate modification of tasks or assist with assess necessary to  complete an evaluation.  OT OCCUPATIONAL PROFILE AND HISTORY: Detailed assessment: Review of records and additional review of physical, cognitive, psychosocial history related to current functional performance.  CLINICAL DECISION MAKING: Moderate - several treatment options, min-mod task modification necessary  REHAB POTENTIAL: Good  EVALUATION COMPLEXITY: High   PLAN: OT FREQUENCY: 1x/week  OT DURATION: 12 weeks  PLANNED INTERVENTIONS: self care/ADL training, therapeutic activity, cognitive remediation/compensation, and psychosocial skills training  RECOMMENDED OTHER SERVICES: PT for shoulder rehab (referral requested and faxed to surgeon)  CONSULTED AND AGREED WITH PLAN OF CARE: Patient  PLAN FOR NEXT SESSION: FOTO completion, IADL training (see goals)   Harrel Carina, MS, OTR/L   Harrel Carina, OT 12/24/2021, 5:06 PM

## 2021-12-24 NOTE — Therapy (Signed)
OUTPATIENT PHYSICAL THERAPY SHOULDER TREATMENT   Patient Name: Angela Fernandez MRN: [id number] DOB:Apr 22, 1946, 76 y.o., female Today's Date: 12/25/2021   PT End of Session - 12/24/21 1518     Visit Number 5    Number of Visits 25    Date for PT Re-Evaluation 02/26/22    Authorization Time Period Initial PT cert 4/40/3474- 25/95/6387    Progress Note Due on Visit 10    PT Start Time 5643    PT Stop Time 1600    PT Time Calculation (min) 45 min    Activity Tolerance Patient tolerated treatment well    Behavior During Therapy WFL for tasks assessed/performed              Past Medical History:  Diagnosis Date   Anemia    Arthritis    Bipolar affect, depressed ([place])    COPD (chronic obstructive pulmonary disease) ([place])    Diabetes mellitus without complication ([place])    type II   GERD (gastroesophageal reflux disease)    Hyperlipidemia    Hypertension    Hypothyroidism    PTSD (post-traumatic stress disorder)    Schizoaffective disorder ([place])    Sleep apnea    cpap   Stroke ([place])    hx of  mini stroke - 2001   TIA (transient ischemic attack) 2010   UTI (urinary tract infection)    Past Surgical History:  Procedure Laterality Date   BILATERAL CARPAL TUNNEL RELEASE Bilateral    BREAST BIOPSY     BREAST SURGERY Right    lumpectomy   CATARACT EXTRACTION W/ INTRAOCULAR LENS  IMPLANT, BILATERAL Bilateral    CHOLECYSTECTOMY     COLONOSCOPY     ESOPHAGOGASTRODUODENOSCOPY     EYE SURGERY     JOINT REPLACEMENT     NASAL SINUS SURGERY     REPLACEMENT TOTAL KNEE BILATERAL Bilateral    REVERSE SHOULDER ARTHROPLASTY Right 09/02/2020   Procedure: REVERSE SHOULDER ARTHROPLASTY;  Surgeon: Corky Mull, MD;  Location: [facility];  Service: Orthopedics;  Laterality: Right;   SHOULDER SURGERY Right 2020   rotator cuff repair   TOTAL SHOULDER REVISION Right 10/23/2021   Procedure: REVERSE SHOULDER REVISION;  Surgeon: Nicholes Stairs, MD;  Location: [facility];  Service:  Orthopedics;  Laterality: Right;  180   Patient Active Problem List   Diagnosis Date Noted   Hx of sleep apnea 12/21/2021   S/P reverse total shoulder arthroplasty, right 10/23/2021   Cystocele with rectocele 04/10/2021   Mixed urinary incontinence due to female genital prolapse 04/10/2021   Submucous leiomyoma of uterus 04/10/2021   Thickened endometrium 04/10/2021   Pelvic pressure in female 02/11/2021   DJD (degenerative joint disease) of cervical spine 12/28/2020   Anxiety, generalized 12/28/2020   History of depression 12/28/2020   Memory difficulty 12/28/2020   Chronic venous insufficiency 12/24/2020   Lymphedema 12/24/2020   Diabetes ([place]) 12/24/2020   Hyperlipidemia 12/24/2020   Essential hypertension 12/24/2020   Closed fracture of neck of right scapula 10/17/2020   Status post reverse total shoulder replacement, right 09/02/2020   Rotator cuff arthropathy, right 07/14/2020    PCP: Adrian Prows  REFERRING PROVIDER: Dr. Victorino December ([facility])  REFERRING DIAG: s/p Right Reverse Total shoulder arthroplasty (REVISION) 10/23/2021  THERAPY DIAG:  Right shoulder pain, unspecified chronicity  Muscle weakness (generalized)  Limited range of motion (ROM) of shoulder  S/P reverse total shoulder arthroplasty, right  Rationale for Evaluation and Treatment Rehabilitation  ONSET DATE: 10/23/2021  SUBJECTIVE:  Patient reports feels like her arm is improving and pain has been well controlled. No pain so far today.                                                                                                                                     PERTINENT HISTORY: History taken per MD note on 6/16 Victorino December, MD)  Angela Fernandez is a 76 y.o. female who complains of right shoulder pain and dysfunction following a elective reverse arthroplasty performed over in [place] a number of months ago.  She had early failure of the baseplate with loosening and now  malposition  PAIN:  Are you having pain? Yes: NPRS scale: 0/10 Pain location: superior and ant shoulder Pain description: ache mostly; but sharp with movement Aggravating factors: active movement; reaching Relieving factors: Rest; Ice; tramadol  PRECAUTIONS: Shoulder- in demographics section   PATIENT GOALS  I want to be able to reach the top of head and also paint as well  OBJECTIVE:   TODAY'S TREATMENT:   Manual Therapy:   PROM R shoulder - Flex, ABD, Scaption, ER/IR x several minutes- firm end feel with minimal soreness  Grade 2 PA/AP/Inf GH glides x 30 bouts in varying ROM positions Supine GHJ inferior distraction for ROM & pain relief     THEREX:  Scaption AROM 0-75 deg  10 reps Supine AAROM ER using PVC pipe- 2 sets of 10 reps Seated Resistive ER (RTB) 2 sets of 10 reps Supine IR- 2 sets of 10 reps Standing serratus punch AROM x 10 reps - VC for technique  Standing shoulder shrugs x 15 reps Standing scapular retractions with RTB, 2x15 Seated bicep curls with RTB, 2x15 each UE Standing scaption AROM x15 reps (VC and use of mirror for feedback)  *Added many of above exercises to HEP today (see below)    PATIENT EDUCATION: Education details: Exercise technique; progression of HEP Person educated: Patient Education method: Explanation, Demonstration, Tactile cues, and Verbal cues Education comprehension: verbalized understanding, returned demonstration, verbal cues required, tactile cues required, and needs further education   HOME EXERCISE PROGRAM: Access Code: [email or web address] URL: [email or web address] Date: 12/24/2021 Prepared by: Sande Brothers  Exercises - Supine Shoulder Flexion Extension AAROM with Dowel  - 3 x weekly - 3 sets - 10 reps - Supine Shoulder External Rotation in 45 Degrees Abduction AAROM with Dowel  - 3 x weekly - 3 sets - 10 reps - Supine Shoulder Scaption with Dowel  - 3 x weekly - 3 sets - 10 reps - Scapular Retraction with  Resistance  - 1 x daily - 3 x weekly - 3 sets - 10 reps - Shoulder External Rotation and Scapular Retraction with Resistance  - 1 x daily - 3 x weekly - 3 sets - 10 reps - Standing Serratus Punch with Resistance  - 1 x daily - 3 x weekly - 3 sets - 10 reps - Standing  Single Arm Elbow Flexion with Resistance  - 1 x daily - 3 x weekly - 3 sets - 10 reps - Standing Shoulder Shrugs  - 1 x daily - 3 x weekly - 3 sets - 10 reps     Access Code: [email or web address] URL: [email or web address] Date: 12/16/2021 Prepared by: Sande Brothers  Exercises - Supine Shoulder Flexion AAROM with Hands Clasped  - 1 x daily - 3 sets - 10 reps - Supine Shoulder Flexion Extension Full Range AROM  - 1 x daily - 3 sets - 10 reps - Supine Shoulder Internal Rotation  - 1 x daily - 3 sets - 10 reps - Supine Shoulder External Rotation Stretch  - 1 x daily - 3 sets - 10 reps - Supine Scapular Retraction  - 1 x daily - 3 sets - 10 reps  Reviewed active elbow flex/ext/forearm sup/pronation/wrist ext/flex  Ice pack to right shoulder (unbilled) in supine after session for pain relief/soreness.   ASSESSMENT:  CLINICAL IMPRESSION:   Patient able to progress well today- following current protocol - patient able to progress to more resistive strengthening without difficulty. Patient was able to improve her ROM and functional use of shoulder with less VC with technique and less UT compensation noted today.   Pt will benefit from PT services to address deficits in ROM, strength, mobility, and pain in order to return to full function at home with less shoulder pain.   OBJECTIVE IMPAIRMENTS decreased activity tolerance, decreased mobility, decreased ROM, decreased strength, decreased safety awareness, hypomobility, impaired flexibility, impaired UE functional use, postural dysfunction, and pain.   ACTIVITY LIMITATIONS carrying, lifting, sleeping, bathing, dressing, reach over head, and hygiene/grooming  PARTICIPATION  LIMITATIONS: meal prep, cleaning, laundry, driving, shopping, community activity, and yard work  PERSONAL FACTORS Behavior pattern and 1-2 comorbidities: COPD, Mult shoulder surgeries  are also affecting patient's functional outcome.   REHAB POTENTIAL: Good  CLINICAL DECISION MAKING: Evolving/moderate complexity  EVALUATION COMPLEXITY: Low   GOALS: Goals reviewed with patient? Yes  SHORT TERM GOALS: Target date: 02/02/2022  (Remove Blue Hyperlink)  Pt will be independent with initial HEP in order to improve strength and decrease pain in order to improve pain-free function at home and work. Baseline: 12/04/2021- No formal HEP in place Goal status: INITIAL  LONG TERM GOALS: Target date: 03/16/2022  (Remove Blue Hyperlink)  Pt will be independent with Final HEP in order to improve strength and decrease pain in order to improve pain-free function at home and work. Baseline: 12/04/2021- No formal HEP in place Goal status: INITIAL  2.  Pt will decrease quick DASH score by at least 8% in order to demonstrate clinically significant reduction in disability. Baseline: 12/04/2021= 61 Goal status: INITIAL  3.  Pt will improve FOTO to target score of 51% to display perceived improvements in ability to complete ADL's.  Baseline: 12/04/2021=40 Goal status: INITIAL  4.  Pt will decrease worst pain as reported on NPRS by at least 3 points in order to demonstrate clinically significant reduction in pain. Baseline: 12/04/2021= 6/10 Right shoulder pain Goal status: INITIAL  5.  Patient will demo > 140 deg of right shoulder elevation (PROM) for improved ROM and overhead activities Baseline: 12/04/2021=PROM R shoulder flex- 118; 105 ABD Goal status: INITIAL  6.  Patient will demo > 100 deg of right shoulder elevation (AROM) for improved ROM and overhead activities Baseline: 12/04/2021=PROM R shoulder flex- 118; 105 ABD Goal status: INITIAL   PLAN: PT FREQUENCY: 2x/week  PT DURATION: 12  weeks  PLANNED INTERVENTIONS: Therapeutic exercises, Therapeutic activity, Patient/Family education, Self Care, Joint mobilization, Dry Needling, Spinal manipulation, Spinal mobilization, Cryotherapy, Moist heat, scar mobilization, Taping, Ultrasound, and Manual therapy  PLAN FOR NEXT SESSION: Manual Therapy for ROM and advance HEP as appropriate.    Ollen Bowl, PT 12/25/21, 9:22 AM

## 2021-12-25 ENCOUNTER — Ambulatory Visit
Admission: RE | Admit: 2021-12-25 | Discharge: 2021-12-25 | Disposition: A | Discharge status: Home / Self Care | Payer: Medicare Other | Source: Ambulatory Visit | Attending: Acute Care | Admitting: Acute Care

## 2021-12-25 DIAGNOSIS — Z122 Encounter for screening for malignant neoplasm of respiratory organs: Secondary | ICD-10-CM | POA: Insufficient documentation

## 2021-12-25 DIAGNOSIS — Z87891 Personal history of nicotine dependence: Secondary | ICD-10-CM | POA: Insufficient documentation

## 2021-12-27 ENCOUNTER — Other Ambulatory Visit: Payer: Self-pay | Admitting: Family Medicine

## 2021-12-27 DIAGNOSIS — I10 Essential (primary) hypertension: Secondary | ICD-10-CM

## 2021-12-27 DIAGNOSIS — E034 Atrophy of thyroid (acquired): Secondary | ICD-10-CM

## 2021-12-28 ENCOUNTER — Ambulatory Visit: Payer: Medicare Other | Admitting: Urology

## 2021-12-29 ENCOUNTER — Other Ambulatory Visit: Payer: Self-pay | Admitting: Acute Care

## 2021-12-29 ENCOUNTER — Ambulatory Visit: Payer: Medicare Other

## 2021-12-29 DIAGNOSIS — Z87891 Personal history of nicotine dependence: Secondary | ICD-10-CM

## 2021-12-29 DIAGNOSIS — M25619 Stiffness of unspecified shoulder, not elsewhere classified: Secondary | ICD-10-CM

## 2021-12-29 DIAGNOSIS — F259 Schizoaffective disorder, unspecified: Secondary | ICD-10-CM

## 2021-12-29 DIAGNOSIS — Z96611 Presence of right artificial shoulder joint: Secondary | ICD-10-CM

## 2021-12-29 DIAGNOSIS — M25511 Pain in right shoulder: Secondary | ICD-10-CM | POA: Diagnosis not present

## 2021-12-29 DIAGNOSIS — M6281 Muscle weakness (generalized): Secondary | ICD-10-CM

## 2021-12-29 DIAGNOSIS — R262 Difficulty in walking, not elsewhere classified: Secondary | ICD-10-CM | POA: Diagnosis not present

## 2021-12-29 DIAGNOSIS — Z122 Encounter for screening for malignant neoplasm of respiratory organs: Secondary | ICD-10-CM

## 2021-12-29 NOTE — Therapy (Signed)
OUTPATIENT PHYSICAL THERAPY SHOULDER TREATMENT   Patient Name: Angela Fernandez MRN: [id number] DOB:1946-02-06, 76 y.o., female Today's Date: 12/29/2021   PT End of Session - 12/29/21 1320     Visit Number 6    Number of Visits 25    Date for PT Re-Evaluation 02/26/22    Authorization Time Period Initial PT cert 09/17/2583- 27/78/2423    Progress Note Due on Visit 10    PT Start Time 1322    PT Stop Time 1406    PT Time Calculation (min) 44 min    Activity Tolerance Patient tolerated treatment well    Behavior During Therapy WFL for tasks assessed/performed              Past Medical History:  Diagnosis Date   Anemia    Arthritis    Bipolar affect, depressed ([place])    COPD (chronic obstructive pulmonary disease) ([place])    Diabetes mellitus without complication ([place])    type II   GERD (gastroesophageal reflux disease)    Hyperlipidemia    Hypertension    Hypothyroidism    PTSD (post-traumatic stress disorder)    Schizoaffective disorder ([place])    Sleep apnea    cpap   Stroke ([place])    hx of  mini stroke - 2001   TIA (transient ischemic attack) 2010   UTI (urinary tract infection)    Past Surgical History:  Procedure Laterality Date   BILATERAL CARPAL TUNNEL RELEASE Bilateral    BREAST BIOPSY     BREAST SURGERY Right    lumpectomy   CATARACT EXTRACTION W/ INTRAOCULAR LENS  IMPLANT, BILATERAL Bilateral    CHOLECYSTECTOMY     COLONOSCOPY     ESOPHAGOGASTRODUODENOSCOPY     EYE SURGERY     JOINT REPLACEMENT     NASAL SINUS SURGERY     REPLACEMENT TOTAL KNEE BILATERAL Bilateral    REVERSE SHOULDER ARTHROPLASTY Right 09/02/2020   Procedure: REVERSE SHOULDER ARTHROPLASTY;  Surgeon: Corky Mull, MD;  Location: [facility] ORS;  Service: Orthopedics;  Laterality: Right;   SHOULDER SURGERY Right 2020   rotator cuff repair   TOTAL SHOULDER REVISION Right 10/23/2021   Procedure: REVERSE SHOULDER REVISION;  Surgeon: Nicholes Stairs, MD;  Location: [facility];  Service:  Orthopedics;  Laterality: Right;  180   Patient Active Problem List   Diagnosis Date Noted   Hx of sleep apnea 12/21/2021   S/P reverse total shoulder arthroplasty, right 10/23/2021   Cystocele with rectocele 04/10/2021   Mixed urinary incontinence due to female genital prolapse 04/10/2021   Submucous leiomyoma of uterus 04/10/2021   Thickened endometrium 04/10/2021   Pelvic pressure in female 02/11/2021   DJD (degenerative joint disease) of cervical spine 12/28/2020   Anxiety, generalized 12/28/2020   History of depression 12/28/2020   Memory difficulty 12/28/2020   Chronic venous insufficiency 12/24/2020   Lymphedema 12/24/2020   Diabetes ([place]) 12/24/2020   Hyperlipidemia 12/24/2020   Essential hypertension 12/24/2020   Closed fracture of neck of right scapula 10/17/2020   Status post reverse total shoulder replacement, right 09/02/2020   Rotator cuff arthropathy, right 07/14/2020    PCP: Adrian Prows  REFERRING PROVIDER: Dr. Victorino December ([facility])  REFERRING DIAG: s/p Right Reverse Total shoulder arthroplasty (REVISION) 10/23/2021  THERAPY DIAG:  Muscle weakness (generalized)  Right shoulder pain, unspecified chronicity  Limited range of motion (ROM) of shoulder  S/P reverse total shoulder arthroplasty, right  Schizoaffective disorder, unspecified type ([place])  Rationale for Evaluation and Treatment Rehabilitation  ONSET DATE: 10/23/2021  SUBJECTIVE:     Patient reports feeling pretty good. States "I do have some questions regarding how to do some of the exercises."                                                                                        PERTINENT HISTORY: History taken per MD note on 6/16 Victorino December, MD)  Angela Fernandez is a 76 y.o. female who complains of right shoulder pain and dysfunction following a elective reverse arthroplasty performed over in [place] a number of months ago.  She had early failure of the baseplate with loosening  and now malposition  PAIN:  Are you having pain? Yes: NPRS scale: 0/10 Pain location: superior and ant shoulder Pain description: ache mostly; but sharp with movement Aggravating factors: active movement; reaching Relieving factors: Rest; Ice; tramadol  PRECAUTIONS: Shoulder- in demographics section   PATIENT GOALS  I want to be able to reach the top of head and also paint as well  OBJECTIVE:   TODAY'S TREATMENT:   Manual Therapy:   PROM R shoulder - Flex, ABD, Scaption, ER/IR x several minutes- Patient with good ability to breathe and relax with minimal muscle guarding. Grade 2 PA/AP/Inf GH glides x 30 bouts in varying ROM positions Supine GHJ inferior distraction x 20 bouts in varying deg of shoulder elevation for ROM & pain relief     THEREX:  Scaption AAROM  with PVC pipe x 15 reps Supine Shoulder flex with PVC pipe x 20 reps Supine AAROM ER using PVC pipe- 25 reps Supine AROM ER x 25 reps Seated Resistive ER (RTB) 2 sets of 10 reps Supine IR- 2 sets of 10 reps Standing serratus punch AROM x 10 reps - VC for technique  Standing shoulder shrugs x 15 reps Standing scapular retractions with RTB, 2x15 Standing scaption AROM x15 reps (VC and use of mirror for feedback)   PATIENT EDUCATION: Education details: Exercise technique; progression of HEP Person educated: Patient Education method: Explanation, Demonstration, Tactile cues, and Verbal cues Education comprehension: verbalized understanding, returned demonstration, verbal cues required, tactile cues required, and needs further education   HOME EXERCISE PROGRAM: Access Code: [email or web address] URL: [email or web address] Date: 12/24/2021 Prepared by: Sande Brothers  Exercises - Supine Shoulder Flexion Extension AAROM with Dowel  - 3 x weekly - 3 sets - 10 reps - Supine Shoulder External Rotation in 45 Degrees Abduction AAROM with Dowel  - 3 x weekly - 3 sets - 10 reps - Supine Shoulder Scaption with  Dowel  - 3 x weekly - 3 sets - 10 reps - Scapular Retraction with Resistance  - 1 x daily - 3 x weekly - 3 sets - 10 reps - Shoulder External Rotation and Scapular Retraction with Resistance  - 1 x daily - 3 x weekly - 3 sets - 10 reps - Standing Serratus Punch with Resistance  - 1 x daily - 3 x weekly - 3 sets - 10 reps - Standing Single Arm Elbow Flexion with Resistance  - 1 x daily - 3 x weekly - 3 sets - 10 reps - Standing Shoulder Shrugs  - 1  x daily - 3 x weekly - 3 sets - 10 reps     Access Code: [email or web address] URL: [email or web address] Date: 12/16/2021 Prepared by: Sande Brothers  Exercises - Supine Shoulder Flexion AAROM with Hands Clasped  - 1 x daily - 3 sets - 10 reps - Supine Shoulder Flexion Extension Full Range AROM  - 1 x daily - 3 sets - 10 reps - Supine Shoulder Internal Rotation  - 1 x daily - 3 sets - 10 reps - Supine Shoulder External Rotation Stretch  - 1 x daily - 3 sets - 10 reps - Supine Scapular Retraction  - 1 x daily - 3 sets - 10 reps  Reviewed active elbow flex/ext/forearm sup/pronation/wrist ext/flex  Ice pack to right shoulder (unbilled) in supine after session for pain relief/soreness.   ASSESSMENT:  CLINICAL IMPRESSION:   Patient continues to demo progress with nearly full PROM with elevation and ER with firm end feel. She had some questions about HEP which were addressed and patient was able to return all demonstration with exercises after review well today - proper technique and no pain. Patient reported mild soreness after session yet  no specific difficulty. Pt will benefit from PT services to address deficits in ROM, strength, mobility, and pain in order to return to full function at home with less shoulder pain.   OBJECTIVE IMPAIRMENTS decreased activity tolerance, decreased mobility, decreased ROM, decreased strength, decreased safety awareness, hypomobility, impaired flexibility, impaired UE functional use, postural dysfunction,  and pain.   ACTIVITY LIMITATIONS carrying, lifting, sleeping, bathing, dressing, reach over head, and hygiene/grooming  PARTICIPATION LIMITATIONS: meal prep, cleaning, laundry, driving, shopping, community activity, and yard work  PERSONAL FACTORS Behavior pattern and 1-2 comorbidities: COPD, Mult shoulder surgeries  are also affecting patient's functional outcome.   REHAB POTENTIAL: Good  CLINICAL DECISION MAKING: Evolving/moderate complexity  EVALUATION COMPLEXITY: Low   GOALS: Goals reviewed with patient? Yes  SHORT TERM GOALS: Target date: 02/02/2022  (Remove Blue Hyperlink)  Pt will be independent with initial HEP in order to improve strength and decrease pain in order to improve pain-free function at home and work. Baseline: 12/04/2021- No formal HEP in place Goal status: INITIAL  LONG TERM GOALS: Target date: 03/16/2022  (Remove Blue Hyperlink)  Pt will be independent with Final HEP in order to improve strength and decrease pain in order to improve pain-free function at home and work. Baseline: 12/04/2021- No formal HEP in place Goal status: INITIAL  2.  Pt will decrease quick DASH score by at least 8% in order to demonstrate clinically significant reduction in disability. Baseline: 12/04/2021= 61 Goal status: INITIAL  3.  Pt will improve FOTO to target score of 51% to display perceived improvements in ability to complete ADL's.  Baseline: 12/04/2021=40 Goal status: INITIAL  4.  Pt will decrease worst pain as reported on NPRS by at least 3 points in order to demonstrate clinically significant reduction in pain. Baseline: 12/04/2021= 6/10 Right shoulder pain Goal status: INITIAL  5.  Patient will demo > 140 deg of right shoulder elevation (PROM) for improved ROM and overhead activities Baseline: 12/04/2021=PROM R shoulder flex- 118; 105 ABD Goal status: INITIAL  6.  Patient will demo > 100 deg of right shoulder elevation (AROM) for improved ROM and overhead  activities Baseline: 12/04/2021=PROM R shoulder flex- 118; 105 ABD Goal status: INITIAL   PLAN: PT FREQUENCY: 2x/week  PT DURATION: 12 weeks  PLANNED INTERVENTIONS: Therapeutic exercises, Therapeutic activity, Patient/Family education, Self Care, Joint mobilization, Dry Needling,  Spinal manipulation, Spinal mobilization, Cryotherapy, Moist heat, scar mobilization, Taping, Ultrasound, and Manual therapy  PLAN FOR NEXT SESSION: Manual Therapy for ROM and advance HEP as appropriate.    Ollen Bowl, PT 12/29/21, 2:10 PM

## 2021-12-31 ENCOUNTER — Ambulatory Visit: Payer: Medicare Other

## 2021-12-31 DIAGNOSIS — K1370 Unspecified lesions of oral mucosa: Secondary | ICD-10-CM | POA: Diagnosis not present

## 2022-01-01 DIAGNOSIS — E039 Hypothyroidism, unspecified: Secondary | ICD-10-CM | POA: Diagnosis not present

## 2022-01-01 DIAGNOSIS — E119 Type 2 diabetes mellitus without complications: Secondary | ICD-10-CM | POA: Diagnosis not present

## 2022-01-01 DIAGNOSIS — I10 Essential (primary) hypertension: Secondary | ICD-10-CM | POA: Diagnosis not present

## 2022-01-01 DIAGNOSIS — E7800 Pure hypercholesterolemia, unspecified: Secondary | ICD-10-CM | POA: Diagnosis not present

## 2022-01-01 DIAGNOSIS — Z794 Long term (current) use of insulin: Secondary | ICD-10-CM | POA: Diagnosis not present

## 2022-01-01 DIAGNOSIS — M12811 Other specific arthropathies, not elsewhere classified, right shoulder: Secondary | ICD-10-CM | POA: Diagnosis not present

## 2022-01-02 NOTE — Progress Notes (Signed)
Reviewed and agree with assessment/plan.   Coriana Angello, MD Agency Pulmonary/Critical Care 01/02/2022, 2:25 PM Pager:  [phone]  

## 2022-01-05 ENCOUNTER — Ambulatory Visit: Payer: Medicare Other

## 2022-01-05 DIAGNOSIS — M25511 Pain in right shoulder: Secondary | ICD-10-CM | POA: Diagnosis not present

## 2022-01-05 DIAGNOSIS — M25619 Stiffness of unspecified shoulder, not elsewhere classified: Secondary | ICD-10-CM | POA: Diagnosis not present

## 2022-01-05 DIAGNOSIS — R262 Difficulty in walking, not elsewhere classified: Secondary | ICD-10-CM | POA: Diagnosis not present

## 2022-01-05 DIAGNOSIS — Z96611 Presence of right artificial shoulder joint: Secondary | ICD-10-CM

## 2022-01-05 DIAGNOSIS — M6281 Muscle weakness (generalized): Secondary | ICD-10-CM

## 2022-01-05 DIAGNOSIS — F259 Schizoaffective disorder, unspecified: Secondary | ICD-10-CM | POA: Diagnosis not present

## 2022-01-05 NOTE — Therapy (Signed)
OUTPATIENT PHYSICAL THERAPY SHOULDER TREATMENT   Patient Name: Angela Fernandez MRN: [id number] DOB:[birth year], 76 y.o., female Today's Date: 01/05/2022   PT End of Session - 01/05/22 1349     Visit Number 7    Number of Visits 25    Date for PT Re-Evaluation 02/26/22    Authorization Time Period Initial PT cert 01/29/1940- 74/12/1446    Progress Note Due on Visit 10    PT Start Time 1347    PT Stop Time 1430    PT Time Calculation (min) 43 min    Activity Tolerance Patient tolerated treatment well    Behavior During Therapy WFL for tasks assessed/performed              Past Medical History:  Diagnosis Date   Anemia    Arthritis    Bipolar affect, depressed ([place])    COPD (chronic obstructive pulmonary disease) ([place])    Diabetes mellitus without complication ([place])    type II   GERD (gastroesophageal reflux disease)    Hyperlipidemia    Hypertension    Hypothyroidism    PTSD (post-traumatic stress disorder)    Schizoaffective disorder ([place])    Sleep apnea    cpap   Stroke ( [place])    hx of  mini stroke - 2001   TIA (transient ischemic attack) 2010   UTI (urinary tract infection)    Past Surgical History:  Procedure Laterality Date   BILATERAL CARPAL TUNNEL RELEASE Bilateral    BREAST BIOPSY     BREAST SURGERY Right    lumpectomy   CATARACT EXTRACTION W/ INTRAOCULAR LENS  IMPLANT, BILATERAL Bilateral    CHOLECYSTECTOMY     COLONOSCOPY     ESOPHAGOGASTRODUODENOSCOPY     EYE SURGERY     JOINT REPLACEMENT     NASAL SINUS SURGERY     REPLACEMENT TOTAL KNEE BILATERAL Bilateral    REVERSE SHOULDER ARTHROPLASTY Right 09/02/2020   Procedure: REVERSE SHOULDER ARTHROPLASTY;  Surgeon: Corky Mull, MD;  Location: [facility];  Service: Orthopedics;  Laterality: Right;   SHOULDER SURGERY Right 2020   rotator cuff repair   TOTAL SHOULDER REVISION Right 10/23/2021   Procedure: REVERSE SHOULDER REVISION;  Surgeon: Nicholes Stairs, MD;  Location: [facility];  Service:  Orthopedics;  Laterality: Right;  180   Patient Active Problem List   Diagnosis Date Noted   Hx of sleep apnea 12/21/2021   S/P reverse total shoulder arthroplasty, right 10/23/2021   Cystocele with rectocele 04/10/2021   Mixed urinary incontinence due to female genital prolapse 04/10/2021   Submucous leiomyoma of uterus 04/10/2021   Thickened endometrium 04/10/2021   Pelvic pressure in female 02/11/2021   DJD (degenerative joint disease) of cervical spine 12/28/2020   Anxiety, generalized 12/28/2020   History of depression 12/28/2020   Memory difficulty 12/28/2020   Chronic venous insufficiency 12/24/2020   Lymphedema 12/24/2020   Diabetes ([place]) 12/24/2020   Hyperlipidemia 12/24/2020   Essential hypertension 12/24/2020   Closed fracture of neck of right scapula 10/17/2020   Status post reverse total shoulder replacement, right 09/02/2020   Rotator cuff arthropathy, right 07/14/2020    PCP: Adrian Prows  REFERRING PROVIDER: Dr. Victorino December ([facility])  REFERRING DIAG: s/p Right Reverse Total shoulder arthroplasty (REVISION) 10/23/2021  THERAPY DIAG:  Muscle weakness (generalized)  Right shoulder pain, unspecified chronicity  Limited range of motion (ROM) of shoulder  S/P reverse total shoulder arthroplasty, right  Rationale for Evaluation and Treatment Rehabilitation  ONSET DATE: 10/23/2021  SUBJECTIVE:  Pt reports no new concerns or complaints since last session. Been compliant with HEP.     PERTINENT HISTORY: History taken per MD note on 6/16 Victorino December, MD)  Angela Fernandez is a 76 y.o. female who complains of right shoulder pain and dysfunction following a elective reverse arthroplasty performed over in [place] a number of months ago.  She had early failure of the baseplate with loosening and now malposition  PAIN:  Are you having pain? Yes: NPRS scale: 0/10 Pain location: superior and ant shoulder Pain description: ache mostly; but sharp with  movement Aggravating factors: active movement; reaching Relieving factors: Rest; Ice; tramadol  PRECAUTIONS: Shoulder- in demographics section   PATIENT GOALS  I want to be able to reach the top of head and also paint as well  OBJECTIVE:   TODAY'S TREATMENT: 01/05/22  THEREX:  For R shoulder  Scaption AAROM  with PVC pipe x 20 reps  Supine Shoulder flex with PVC pipe x 20 reps  Supine AAROM ER using PVC pipe 2x15 reps  Seated Resistive ER (YTB): unable to perform without extensive wrist extension and shoulder abduction. Regressed to L side lying R shoulder ER: Required max TC's for form/technique. AAROM. VC's for eccentric descent. 2x10. Updated HEP to this regression.  Supine IR to ER with towel under elbow- 2 sets of 10 reps/position  Supine serratus punch AROM 2x10 reps - VC/TC for technique   Standing scapular retractions with RTB, 2x15. Progressed to GTB, 2x10. Given GTB for HEP.   PATIENT EDUCATION: Education details: Exercise technique; progression of HEP Person educated: Patient Education method: Explanation, Demonstration, Tactile cues, and Verbal cues Education comprehension: verbalized understanding, returned demonstration, verbal cues required, tactile cues required, and needs further education   HOME EXERCISE PROGRAM: Access Code: [email or web address] URL: [email or web address] Date: 12/24/2021 Prepared by: Sande Brothers  Exercises - Supine Shoulder Flexion Extension AAROM with Dowel  - 3 x weekly - 3 sets - 10 reps - Supine Shoulder External Rotation in 45 Degrees Abduction AAROM with Dowel  - 3 x weekly - 3 sets - 10 reps - Supine Shoulder Scaption with Dowel  - 3 x weekly - 3 sets - 10 reps - Scapular Retraction with Resistance  - 1 x daily - 3 x weekly - 3 sets - 10 reps - Shoulder External Rotation and Scapular Retraction with Resistance  - 1 x daily - 3 x weekly - 3 sets - 10 reps - Standing Serratus Punch with Resistance  - 1 x daily - 3 x  weekly - 3 sets - 10 reps - Standing Single Arm Elbow Flexion with Resistance  - 1 x daily - 3 x weekly - 3 sets - 10 reps - Standing Shoulder Shrugs  - 1 x daily - 3 x weekly - 3 sets - 10 reps     Access Code: [email or web address] URL: [email or web address] Date: 12/16/2021 Prepared by: Sande Brothers  Exercises - Supine Shoulder Flexion AAROM with Hands Clasped  - 1 x daily - 3 sets - 10 reps - Supine Shoulder Flexion Extension Full Range AROM  - 1 x daily - 3 sets - 10 reps - Supine Shoulder Internal Rotation  - 1 x daily - 3 sets - 10 reps - Supine Shoulder External Rotation Stretch  - 1 x daily - 3 sets - 10 reps - Supine Scapular Retraction  - 1 x daily - 3 sets - 10 reps  Reviewed active elbow flex/ext/forearm sup/pronation/wrist ext/flex  Ice pack to right shoulder (unbilled) in  supine after session for pain relief/soreness.   ASSESSMENT:  CLINICAL IMPRESSION: Pt continuing to benefit from PT. Demonstrating excellent AROM in all planes in gravity reduced positions as evident from given exercises. Updated HEP for scap retractions to GTB and regressed R shoulder ER resisted to side lying due to significant compensations needed with TB strengthening. No R shoulder pain throughout session. Pt will benefit from PT services to address deficits in ROM, strength, mobility, and pain in order to return to full function at home with less shoulder pain.  OBJECTIVE IMPAIRMENTS decreased activity tolerance, decreased mobility, decreased ROM, decreased strength, decreased safety awareness, hypomobility, impaired flexibility, impaired UE functional use, postural dysfunction, and pain.   ACTIVITY LIMITATIONS carrying, lifting, sleeping, bathing, dressing, reach over head, and hygiene/grooming  PARTICIPATION LIMITATIONS: meal prep, cleaning, laundry, driving, shopping, community activity, and yard work  PERSONAL FACTORS Behavior pattern and 1-2 comorbidities: COPD, Mult shoulder  surgeries  are also affecting patient's functional outcome.   REHAB POTENTIAL: Good  CLINICAL DECISION MAKING: Evolving/moderate complexity  EVALUATION COMPLEXITY: Low   GOALS: Goals reviewed with patient? Yes  SHORT TERM GOALS: Target date: 02/02/2022  (Remove Blue Hyperlink)  Pt will be independent with initial HEP in order to improve strength and decrease pain in order to improve pain-free function at home and work. Baseline: 12/04/2021- No formal HEP in place Goal status: INITIAL   LONG TERM GOALS: Target date: 03/16/2022  (Remove Blue Hyperlink)  Pt will be independent with Final HEP in order to improve strength and decrease pain in order to improve pain-free function at home and work. Baseline: 12/04/2021- No formal HEP in place Goal status: INITIAL  2.  Pt will decrease quick DASH score by at least 8% in order to demonstrate clinically significant reduction in disability. Baseline: 12/04/2021= 61 Goal status: INITIAL  3.  Pt will improve FOTO to target score of 51% to display perceived improvements in ability to complete ADL's.  Baseline: 12/04/2021=40 Goal status: INITIAL  4.  Pt will decrease worst pain as reported on NPRS by at least 3 points in order to demonstrate clinically significant reduction in pain. Baseline: 12/04/2021= 6/10 Right shoulder pain Goal status: INITIAL  5.  Patient will demo > 140 deg of right shoulder elevation (PROM) for improved ROM and overhead activities Baseline: 12/04/2021=PROM R shoulder flex- 118; 105 ABD Goal status: INITIAL  6.  Patient will demo > 100 deg of right shoulder elevation (AROM) for improved ROM and overhead activities Baseline: 12/04/2021=PROM R shoulder flex- 118; 105 ABD Goal status: INITIAL   PLAN: PT FREQUENCY: 2x/week  PT DURATION: 12 weeks  PLANNED INTERVENTIONS: Therapeutic exercises, Therapeutic activity, Patient/Family education, Self Care, Joint mobilization, Dry Needling, Spinal manipulation, Spinal  mobilization, Cryotherapy, Moist heat, scar mobilization, Taping, Ultrasound, and Manual therapy  PLAN FOR NEXT SESSION: Manual Therapy for ROM and advance HEP as appropriate.   Salem Caster. [place] IV, PT, DPT Physical Therapist- [facility]  01/05/22, 3:33 PM

## 2022-01-07 ENCOUNTER — Ambulatory Visit: Payer: Medicare Other

## 2022-01-07 DIAGNOSIS — R4189 Other symptoms and signs involving cognitive functions and awareness: Secondary | ICD-10-CM

## 2022-01-07 DIAGNOSIS — F259 Schizoaffective disorder, unspecified: Secondary | ICD-10-CM | POA: Diagnosis not present

## 2022-01-07 DIAGNOSIS — Z96611 Presence of right artificial shoulder joint: Secondary | ICD-10-CM | POA: Diagnosis not present

## 2022-01-07 DIAGNOSIS — M25511 Pain in right shoulder: Secondary | ICD-10-CM | POA: Diagnosis not present

## 2022-01-07 DIAGNOSIS — R262 Difficulty in walking, not elsewhere classified: Secondary | ICD-10-CM | POA: Diagnosis not present

## 2022-01-07 DIAGNOSIS — M25619 Stiffness of unspecified shoulder, not elsewhere classified: Secondary | ICD-10-CM | POA: Diagnosis not present

## 2022-01-07 DIAGNOSIS — M6281 Muscle weakness (generalized): Secondary | ICD-10-CM | POA: Diagnosis not present

## 2022-01-07 NOTE — Therapy (Signed)
OUTPATIENT OCCUPATIONAL THERAPY TREATMENT  Patient Name: Angela Fernandez MRN: [id number] DOB:Nov 29, 1945, 76 y.o., female Today's Date: 01/07/2022  PCP: Dr. Leonel Ramsay REFERRING PROVIDER: Dr. Norman Clay   OT End of Session - 01/07/22 1948     Visit Number 8    Number of Visits 12    Date for OT Re-Evaluation 02/04/22    OT Start Time 1105    OT Stop Time 1950    OT Time Calculation (min) 40 min    Activity Tolerance Patient tolerated treatment well    Behavior During Therapy WFL for tasks assessed/performed              Past Medical History:  Diagnosis Date   Anemia    Arthritis    Bipolar affect, depressed ([place])    COPD (chronic obstructive pulmonary disease) ([place])    Diabetes mellitus without complication ([place])    type II   GERD (gastroesophageal reflux disease)    Hyperlipidemia    Hypertension    Hypothyroidism    PTSD (post-traumatic stress disorder)    Schizoaffective disorder ([place])    Sleep apnea    cpap   Stroke ([place])    hx of  mini stroke - 2001   TIA (transient ischemic attack) 2010   UTI (urinary tract infection)    Past Surgical History:  Procedure Laterality Date   BILATERAL CARPAL TUNNEL RELEASE Bilateral    BREAST BIOPSY     BREAST SURGERY Right    lumpectomy   CATARACT EXTRACTION W/ INTRAOCULAR LENS  IMPLANT, BILATERAL Bilateral    CHOLECYSTECTOMY     COLONOSCOPY     ESOPHAGOGASTRODUODENOSCOPY     EYE SURGERY     JOINT REPLACEMENT     NASAL SINUS SURGERY     REPLACEMENT TOTAL KNEE BILATERAL Bilateral    REVERSE SHOULDER ARTHROPLASTY Right 09/02/2020   Procedure: REVERSE SHOULDER ARTHROPLASTY;  Surgeon: Corky Mull, MD;  Location: [facility];  Service: Orthopedics;  Laterality: Right;   SHOULDER SURGERY Right 2020   rotator cuff repair   TOTAL SHOULDER REVISION Right 10/23/2021   Procedure: REVERSE SHOULDER REVISION;  Surgeon: Nicholes Stairs, MD;  Location: [facility];  Service: Orthopedics;  Laterality: Right;  180    Patient Active Problem List   Diagnosis Date Noted   Hx of sleep apnea 12/21/2021   S/P reverse total shoulder arthroplasty, right 10/23/2021   Cystocele with rectocele 04/10/2021   Mixed urinary incontinence due to female genital prolapse 04/10/2021   Submucous leiomyoma of uterus 04/10/2021   Thickened endometrium 04/10/2021   Pelvic pressure in female 02/11/2021   DJD (degenerative joint disease) of cervical spine 12/28/2020   Anxiety, generalized 12/28/2020   History of depression 12/28/2020   Memory difficulty 12/28/2020   Chronic venous insufficiency 12/24/2020   Lymphedema 12/24/2020   Diabetes ([place]) 12/24/2020   Hyperlipidemia 12/24/2020   Essential hypertension 12/24/2020   Closed fracture of neck of right scapula 10/17/2020   Status post reverse total shoulder replacement, right 09/02/2020   Rotator cuff arthropathy, right 07/14/2020    ONSET DATE: 10/13/21 (referral date)  REFERRING DIAG: Cognitive decline, schizoaffective disorder, unspecified type  THERAPY DIAG:  Schizoaffective disorder, unspecified type ([place])  Cognitive decline  Rationale for Evaluation and Treatment Rehabilitation  SUBJECTIVE: Pt reports that they will be starting to move boxes today to her new home and that she will likely start staying there on 9/5 next week.  Pt accompanied by: self  PERTINENT HISTORY: Per chart, Schizoaffective disorder,  PTSD, anxiety, depression, insomnia, sleep apnea, occasional passive SI, occasional AH (voices); Recent R reverse TSA on 10/23/21.  Per pt, she moved from [place], approximately 1 year and 4-5 months ago to move in with her daughter and granddaughter.  Pt reports she is actively looking for a house to move out, with plans to have granddaughter live with her.  Pt reports being eager to be more indep in her living situation.  PAIN:  Are you having pain? Pt reports 7-8/10 pain in bilat feet, back, and neck.  Rest helps to ease pain.   PATIENT GOALS : to  move out and live more independently, possibly with granddaughter living with her.    OBJECTIVE:  Self Care:   Pt worked on Associate Professor with emphasis placed on navigating through the phone in order to access the Internet for business website search options and editing information in pt's contact list.  OT provided scenarios in which she may need to call other numbers other than daughter or granddaughter's cell phones in an emergency if one of them were not answering their phone.  OT assisted pt to use Google search on phone to look up granddaughter's work number, add this to contact information, along with work address and save to her phone for future reference.  Pt also used a Producer, television/film/video to look up other numbers of her personal interest, including a SNAP benefits number and a local car repair shop; pt required mod A for navigating these topics, but is improving with how to correct an error.  If pt hit a button in error on her phone, as she was more consistent to hit the back arrow, "x" out of the current screen, or return to her home screen to start over with fewer navigational cues.        PATIENT EDUCATION: Education details: navigating cell phone to edit contact information and search businesses for addresses and phone numbers Person educated: Patient Education method: Explanation, demonstration  Education comprehension: verbalized understanding, mod A  GOALS: Goals reviewed with patient? Yes  SHORT TERM GOALS: Target date: 12/24/21    Pt will utilize visual reminders to independently identify emergency numbers and frequent contacts (ie family members, MD offices) to call when in need of assistance. Baseline: Pt was unsure whether "911" was the correct number to call for a house fire and reported being unable to search for information on her phone to identify people/places. Goal status: INITIAL  2.  Pt will be indep to address an envelope for mailing a bill or letter. Baseline:  Pt forgetful to her zip code and left off the PO box number on a simulated utility bill. Goal status: INITIAL  3. Pt will complete Pill Box Assessment Test to determine level of assist needed for medication management.  Baseline: Not yet completed.  Goal status: Initial  4. Pt will develop organizational system (Ie: calendar/chart) to identify when monthly bills are due for timely bill paying.  Baseline: Not yet initiated.  Goal status: Initial   LONG TERM GOALS: Target date: 02/04/22    Pt will write checks for monthly bills with min supv for accuracy and completion.  Baseline: Daughter currently manages bills. Goal status: INITIAL  2.  Pt will manage medications with mod A. (May revise supervision level following completion of Pill Box assessment) Baseline: Daughter currently manages medications Goal status: INITIAL  3.  Pt will be indep to utilize smart phone to look up desired information for increasing community engagement  and socialization (locations and phone numbers for local restaurants, movie theaters, parks) Baseline: Pt reports she does not know how to look up information on her smart phone (Brief instruction given at eval but additional training needed). Goal status: INITIAL  4.  Pt will demo indep with use of map quest/google maps or a like app to increase indep with community mobility. Baseline: Pt can drive to a few familiar locations (granddaughter's place of work, MD offices), but otherwise does not know how to utilize phone to access new locations. Goal status: INITIAL  ASSESSMENT:  CLINICAL IMPRESSION:  Pt. reports that she has not practiced on her cell phone much this week d/t being busy with PT exercises and preparing for moving.  Pt required mod A today to navigate through phone to use Google searches for local business addresses and phone numbers, as well as making edits in her contact list.  Pt continues to report that she really needs to find a  car repair  shop so that she can have her car fixed, Pt was able to search a local dealership which she thinks her family has used and made a note for the number to inquire about an appointment; mod A for phone navigation.  When pt hit a button in error on her phone, she was more consistent to hit the back arrow, "x" out of the current screen, or return to her home screen to start over with fewer navigational cues as compared to previous sessions.  OT encouraged pt to continue to practice phone navigation at home on a daily basis to increase confidence and proficiency.  Pt agreed.  Pt continues to require repetition for carry over of new information, but does have good note taking skills, though she stated that she has written reminders at home from previous sessions that she has neglected to review.  Pt will benefit from continued OT to work towards goals in plan of care to maximize safety and independence in necessary daily tasks.   PERFORMANCE DEFICITS in functional skills including IADLs and pain, cognitive skills including memory and information processing , and psychosocial skills including environmental adaptation and interpersonal interactions.   IMPAIRMENTS are limiting patient from IADLs, leisure, and social participation.   COMORBIDITIES may have co-morbidities  that affects occupational performance. Patient will benefit from skilled OT to address above impairments and improve overall function.  MODIFICATION OR ASSISTANCE TO COMPLETE EVALUATION: Min-Moderate modification of tasks or assist with assess necessary to complete an evaluation.  OT OCCUPATIONAL PROFILE AND HISTORY: Detailed assessment: Review of records and additional review of physical, cognitive, psychosocial history related to current functional performance.  CLINICAL DECISION MAKING: Moderate - several treatment options, min-mod task modification necessary  REHAB POTENTIAL: Good  EVALUATION COMPLEXITY: High   PLAN: OT FREQUENCY:  1x/week  OT DURATION: 12 weeks  PLANNED INTERVENTIONS: self care/ADL training, therapeutic activity, cognitive remediation/compensation, and psychosocial skills training  RECOMMENDED OTHER SERVICES: PT for shoulder rehab (referral requested and faxed to surgeon)  CONSULTED AND AGREED WITH PLAN OF CARE: Patient  PLAN FOR NEXT SESSION: FOTO completion, IADL training (see goals)   Leta Speller, MS, OTR/L   Darleene Cleaver, OT 01/07/2022, 7:49 PM

## 2022-01-08 DIAGNOSIS — J449 Chronic obstructive pulmonary disease, unspecified: Secondary | ICD-10-CM | POA: Diagnosis not present

## 2022-01-08 DIAGNOSIS — E785 Hyperlipidemia, unspecified: Secondary | ICD-10-CM | POA: Diagnosis not present

## 2022-01-08 DIAGNOSIS — Z794 Long term (current) use of insulin: Secondary | ICD-10-CM | POA: Diagnosis not present

## 2022-01-08 DIAGNOSIS — E119 Type 2 diabetes mellitus without complications: Secondary | ICD-10-CM | POA: Diagnosis not present

## 2022-01-08 DIAGNOSIS — E039 Hypothyroidism, unspecified: Secondary | ICD-10-CM | POA: Diagnosis not present

## 2022-01-08 DIAGNOSIS — I10 Essential (primary) hypertension: Secondary | ICD-10-CM | POA: Diagnosis not present

## 2022-01-12 DIAGNOSIS — E119 Type 2 diabetes mellitus without complications: Secondary | ICD-10-CM | POA: Diagnosis not present

## 2022-01-12 DIAGNOSIS — M48062 Spinal stenosis, lumbar region with neurogenic claudication: Secondary | ICD-10-CM | POA: Diagnosis not present

## 2022-01-13 ENCOUNTER — Ambulatory Visit: Payer: Medicare Other | Attending: Psychiatry

## 2022-01-13 DIAGNOSIS — F259 Schizoaffective disorder, unspecified: Secondary | ICD-10-CM | POA: Insufficient documentation

## 2022-01-13 DIAGNOSIS — R262 Difficulty in walking, not elsewhere classified: Secondary | ICD-10-CM

## 2022-01-13 DIAGNOSIS — M6281 Muscle weakness (generalized): Secondary | ICD-10-CM | POA: Diagnosis not present

## 2022-01-13 DIAGNOSIS — M25619 Stiffness of unspecified shoulder, not elsewhere classified: Secondary | ICD-10-CM

## 2022-01-13 DIAGNOSIS — M25511 Pain in right shoulder: Secondary | ICD-10-CM | POA: Diagnosis not present

## 2022-01-13 DIAGNOSIS — Z96611 Presence of right artificial shoulder joint: Secondary | ICD-10-CM | POA: Diagnosis not present

## 2022-01-13 DIAGNOSIS — R4189 Other symptoms and signs involving cognitive functions and awareness: Secondary | ICD-10-CM | POA: Insufficient documentation

## 2022-01-13 NOTE — Therapy (Signed)
OUTPATIENT PHYSICAL THERAPY SHOULDER TREATMENT   Patient Name: Angela Fernandez MRN: [id number] DOB:[birth year], 76 y.o., female Today's Date: 01/14/2022   PT End of Session - 01/13/22 1152     Visit Number 8    Number of Visits 25    Date for PT Re-Evaluation 02/26/22    Authorization Time Period Initial PT cert 2/54/2706- 23/76/2831    Progress Note Due on Visit 10    PT Start Time 5176    PT Stop Time 1226    PT Time Calculation (min) 41 min    Activity Tolerance Patient tolerated treatment well    Behavior During Therapy WFL for tasks assessed/performed              Past Medical History:  Diagnosis Date   Anemia    Arthritis    Bipolar affect, depressed ([place])    COPD (chronic obstructive pulmonary disease) ([place])    Diabetes mellitus without complication ([place])    type II   GERD (gastroesophageal reflux disease)    Hyperlipidemia    Hypertension    Hypothyroidism    PTSD (post-traumatic stress disorder)    Schizoaffective disorder ([place])    Sleep apnea    cpap   Stroke ([place])    hx of  mini stroke - 2001   TIA (transient ischemic attack) 2010   UTI (urinary tract infection)    Past Surgical History:  Procedure Laterality Date   BILATERAL CARPAL TUNNEL RELEASE Bilateral    BREAST BIOPSY     BREAST SURGERY Right    lumpectomy   CATARACT EXTRACTION W/ INTRAOCULAR LENS  IMPLANT, BILATERAL Bilateral    CHOLECYSTECTOMY     COLONOSCOPY     ESOPHAGOGASTRODUODENOSCOPY     EYE SURGERY     JOINT REPLACEMENT     NASAL SINUS SURGERY     REPLACEMENT TOTAL KNEE BILATERAL Bilateral    REVERSE SHOULDER ARTHROPLASTY Right 09/02/2020   Procedure: REVERSE SHOULDER ARTHROPLASTY;  Surgeon: Corky Mull, MD;  Location: [facility];  Service: Orthopedics;  Laterality: Right;   SHOULDER SURGERY Right 2020   rotator cuff repair   TOTAL SHOULDER REVISION Right 10/23/2021   Procedure: REVERSE SHOULDER REVISION;  Surgeon: Nicholes Stairs, MD;  Location: [facility];  Service:  Orthopedics;  Laterality: Right;  180   Patient Active Problem List   Diagnosis Date Noted   Hx of sleep apnea 12/21/2021   S/P reverse total shoulder arthroplasty, right 10/23/2021   Cystocele with rectocele 04/10/2021   Mixed urinary incontinence due to female genital prolapse 04/10/2021   Submucous leiomyoma of uterus 04/10/2021   Thickened endometrium 04/10/2021   Pelvic pressure in female 02/11/2021   DJD (degenerative joint disease) of cervical spine 12/28/2020   Anxiety, generalized 12/28/2020   History of depression 12/28/2020   Memory difficulty 12/28/2020   Chronic venous insufficiency 12/24/2020   Lymphedema 12/24/2020   Diabetes ([place]) 12/24/2020   Hyperlipidemia 12/24/2020   Essential hypertension 12/24/2020   Closed fracture of neck of right scapula 10/17/2020   Status post reverse total shoulder replacement, right 09/02/2020   Rotator cuff arthropathy, right 07/14/2020    PCP: Adrian Prows  REFERRING PROVIDER: Dr. Victorino December ([facility])  REFERRING DIAG: s/p Right Reverse Total shoulder arthroplasty (REVISION) 10/23/2021  THERAPY DIAG:  Muscle weakness (generalized)  Right shoulder pain, unspecified chronicity  Limited range of motion (ROM) of shoulder  S/P reverse total shoulder arthroplasty, right  Difficulty in walking, not elsewhere classified  Rationale for Evaluation and Treatment  Rehabilitation  ONSET DATE: 10/23/2021  SUBJECTIVE:  Pt reports continues to feel well without any increased pain. States she didn't do as well with her shoulder exercises last visit.     PERTINENT HISTORY: History taken per MD note on 6/16 Victorino December, MD)  Angela Fernandez is a 76 y.o. female who complains of right shoulder pain and dysfunction following a elective reverse arthroplasty performed over in [place] a number of months ago.  She had early failure of the baseplate with loosening and now malposition  PAIN:  Are you having pain? Yes: NPRS scale:  0/10 Pain location: superior and ant shoulder Pain description: ache mostly; but sharp with movement Aggravating factors: active movement; reaching Relieving factors: Rest; Ice; tramadol  PRECAUTIONS: Shoulder- in demographics section   PATIENT GOALS  I want to be able to reach the top of head and also paint as well  OBJECTIVE:   TODAY'S TREATMENT: 01/13/22   AROM Supine Right Shoulder: Flex= 132 ABD= 120 ER= 73 IR= 81  THEREX:  R shoulder  Supine Scaption AROM x 2 sets of 12 reps  S[place]e Shoulder flex AROM  2 x 12 reps  Sidelye R shoulder ER: Required max TC's for form/technique. AAROM. VC's for eccentric descent. 2x10. Updated HEP to this regression.  Supine serratus punch AROM 2x15 reps - VC/TC for technique   Standing scapular retractions with GTB, 2x15.  Seated scapular mobilization- AROM (protraction/retraction/Elevation/depression)  2 sets x 12 reps  Bicep curl- 3# x 12 reps x 2 sets   PATIENT EDUCATION: Education details: Exercise technique; progression of HEP Person educated: Patient Education method: Explanation, Demonstration, Tactile cues, and Verbal cues Education comprehension: verbalized understanding, returned demonstration, verbal cues required, tactile cues required, and needs further education   HOME EXERCISE PROGRAM: Access Code: [email or web address] URL: [email or web address] Date: 12/24/2021 Prepared by: Sande Brothers  Exercises - Supine Shoulder Flexion Extension AAROM with Dowel  - 3 x weekly - 3 sets - 10 reps - Supine Shoulder External Rotation in 45 Degrees Abduction AAROM with Dowel  - 3 x weekly - 3 sets - 10 reps - Supine Shoulder Scaption with Dowel  - 3 x weekly - 3 sets - 10 reps - Scapular Retraction with Resistance  - 1 x daily - 3 x weekly - 3 sets - 10 reps - Shoulder External Rotation and Scapular Retraction with Resistance  - 1 x daily - 3 x weekly - 3 sets - 10 reps - Standing Serratus Punch with Resistance  - 1  x daily - 3 x weekly - 3 sets - 10 reps - Standing Single Arm Elbow Flexion with Resistance  - 1 x daily - 3 x weekly - 3 sets - 10 reps - Standing Shoulder Shrugs  - 1 x daily - 3 x weekly - 3 sets - 10 reps     Access Code: [email or web address] URL: [email or web address] Date: 12/16/2021 Prepared by: Sande Brothers  Exercises - Supine Shoulder Flexion AAROM with Hands Clasped  - 1 x daily - 3 sets - 10 reps - Supine Shoulder Flexion Extension Full Range AROM  - 1 x daily - 3 sets - 10 reps - Supine Shoulder Internal Rotation  - 1 x daily - 3 sets - 10 reps - Supine Shoulder External Rotation Stretch  - 1 x daily - 3 sets - 10 reps - Supine Scapular Retraction  - 1 x daily - 3 sets - 10 reps  Reviewed active elbow flex/ext/forearm sup/pronation/wrist ext/flex  Ice pack to right shoulder (  unbilled) in supine after session for pain relief/soreness.   ASSESSMENT:  CLINICAL IMPRESSION: Pt performed well overall with AROM right shoulder activities- Decreased VC to avoid UT compensation. No pain during activities but still weak with External Rotation at this time- VC for safe technique. Following protocol well and good scapular mobility.  Pt will benefit from PT services to address deficits in ROM, strength, mobility, and pain in order to return to full function at home with less shoulder pain.  OBJECTIVE IMPAIRMENTS decreased activity tolerance, decreased mobility, decreased ROM, decreased strength, decreased safety awareness, hypomobility, impaired flexibility, impaired UE functional use, postural dysfunction, and pain.   ACTIVITY LIMITATIONS carrying, lifting, sleeping, bathing, dressing, reach over head, and hygiene/grooming  PARTICIPATION LIMITATIONS: meal prep, cleaning, laundry, driving, shopping, community activity, and yard work  PERSONAL FACTORS Behavior pattern and 1-2 comorbidities: COPD, Mult shoulder surgeries  are also affecting patient's functional outcome.    REHAB POTENTIAL: Good  CLINICAL DECISION MAKING: Evolving/moderate complexity  EVALUATION COMPLEXITY: Low   GOALS: Goals reviewed with patient? Yes  SHORT TERM GOALS: Target date: 02/02/2022  (Remove Blue Hyperlink)  Pt will be independent with initial HEP in order to improve strength and decrease pain in order to improve pain-free function at home and work. Baseline: 12/04/2021- No formal HEP in place Goal status: INITIAL   LONG TERM GOALS: Target date: 03/16/2022  (Remove Blue Hyperlink)  Pt will be independent with Final HEP in order to improve strength and decrease pain in order to improve pain-free function at home and work. Baseline: 12/04/2021- No formal HEP in place Goal status: INITIAL  2.  Pt will decrease quick DASH score by at least 8% in order to demonstrate clinically significant reduction in disability. Baseline: 12/04/2021= 61 Goal status: INITIAL  3.  Pt will improve FOTO to target score of 51% to display perceived improvements in ability to complete ADL's.  Baseline: 12/04/2021=40 Goal status: INITIAL  4.  Pt will decrease worst pain as reported on NPRS by at least 3 points in order to demonstrate clinically significant reduction in pain. Baseline: 12/04/2021= 6/10 Right shoulder pain Goal status: INITIAL  5.  Patient will demo > 140 deg of right shoulder elevation (PROM) for improved ROM and overhead activities Baseline: 12/04/2021=PROM R shoulder flex- 118; 105 ABD Goal status: INITIAL  6.  Patient will demo > 100 deg of right shoulder elevation (AROM) for improved ROM and overhead activities Baseline: 12/04/2021=PROM R shoulder flex- 118; 105 ABD Goal status: INITIAL   PLAN: PT FREQUENCY: 2x/week  PT DURATION: 12 weeks  PLANNED INTERVENTIONS: Therapeutic exercises, Therapeutic activity, Patient/Family education, Self Care, Joint mobilization, Dry Needling, Spinal manipulation, Spinal mobilization, Cryotherapy, Moist heat, scar mobilization, Taping,  Ultrasound, and Manual therapy  PLAN FOR NEXT SESSION: Manual Therapy for ROM and advance HEP as appropriate.   Ollen Bowl, PT Physical Therapist- [facility]  01/14/22, 12:57 PM

## 2022-01-14 ENCOUNTER — Ambulatory Visit: Payer: Medicare Other

## 2022-01-14 DIAGNOSIS — R262 Difficulty in walking, not elsewhere classified: Secondary | ICD-10-CM | POA: Diagnosis not present

## 2022-01-14 DIAGNOSIS — M25619 Stiffness of unspecified shoulder, not elsewhere classified: Secondary | ICD-10-CM

## 2022-01-14 DIAGNOSIS — M6281 Muscle weakness (generalized): Secondary | ICD-10-CM | POA: Diagnosis not present

## 2022-01-14 DIAGNOSIS — Z96611 Presence of right artificial shoulder joint: Secondary | ICD-10-CM | POA: Diagnosis not present

## 2022-01-14 DIAGNOSIS — M25511 Pain in right shoulder: Secondary | ICD-10-CM

## 2022-01-14 DIAGNOSIS — F259 Schizoaffective disorder, unspecified: Secondary | ICD-10-CM

## 2022-01-14 DIAGNOSIS — R4189 Other symptoms and signs involving cognitive functions and awareness: Secondary | ICD-10-CM

## 2022-01-14 NOTE — Therapy (Signed)
OUTPATIENT PHYSICAL THERAPY SHOULDER TREATMENT   Patient Name: Angela Fernandez MRN: [id number] DOB:[birth year], 76 y.o., female Today's Date: 01/14/2022   PT End of Session - 01/14/22 1258     Visit Number 9    Number of Visits 25    Date for PT Re-Evaluation 02/26/22    Authorization Time Period Initial PT cert 2/45/8099- 83/38/2505    Progress Note Due on Visit 10    PT Start Time 1257    PT Stop Time 1336    PT Time Calculation (min) 39 min    Activity Tolerance Patient tolerated treatment well    Behavior During Therapy WFL for tasks assessed/performed              Past Medical History:  Diagnosis Date   Anemia    Arthritis    Bipolar affect, depressed ([place])    COPD (chronic obstructive pulmonary disease) ([place])    Diabetes mellitus without complication ([place])    type II   GERD (gastroesophageal reflux disease)    Hyperlipidemia    Hypertension    Hypothyroidism    PTSD (post-traumatic stress disorder)    Schizoaffective disorder ([place])    Sleep apnea    cpap   Stroke ([place])    hx of  mini stroke - 2001   TIA (transient ischemic attack) 2010   UTI (urinary tract infection)    Past Surgical History:  Procedure Laterality Date   BILATERAL CARPAL TUNNEL RELEASE Bilateral    BREAST BIOPSY     BREAST SURGERY Right    lumpectomy   CATARACT EXTRACTION W/ INTRAOCULAR LENS  IMPLANT, BILATERAL Bilateral    CHOLECYSTECTOMY     COLONOSCOPY     ESOPHAGOGASTRODUODENOSCOPY     EYE SURGERY     JOINT REPLACEMENT     NASAL SINUS SURGERY     REPLACEMENT TOTAL KNEE BILATERAL Bilateral    REVERSE SHOULDER ARTHROPLASTY Right 09/02/2020   Procedure: REVERSE SHOULDER ARTHROPLASTY;  Surgeon: Corky Mull, MD;  Location: [facility] ORS;  Service: Orthopedics;  Laterality: Right;   SHOULDER SURGERY Right 2020   rotator cuff repair   TOTAL SHOULDER REVISION Right 10/23/2021   Procedure: REVERSE SHOULDER REVISION;  Surgeon: Nicholes Stairs, MD;  Location: [facility];  Service:  Orthopedics;  Laterality: Right;  180   Patient Active Problem List   Diagnosis Date Noted   Hx of sleep apnea 12/21/2021   S/P reverse total shoulder arthroplasty, right 10/23/2021   Cystocele with rectocele 04/10/2021   Mixed urinary incontinence due to female genital prolapse 04/10/2021   Submucous leiomyoma of uterus 04/10/2021   Thickened endometrium 04/10/2021   Pelvic pressure in female 02/11/2021   DJD (degenerative joint disease) of cervical spine 12/28/2020   Anxiety, generalized 12/28/2020   History of depression 12/28/2020   Memory difficulty 12/28/2020   Chronic venous insufficiency 12/24/2020   Lymphedema 12/24/2020   Diabetes ([place]) 12/24/2020   Hyperlipidemia 12/24/2020   Essential hypertension 12/24/2020   Closed fracture of neck of right scapula 10/17/2020   Status post reverse total shoulder replacement, right 09/02/2020   Rotator cuff arthropathy, right 07/14/2020    PCP: Adrian Prows  REFERRING PROVIDER: Dr. Victorino December ([facility])  REFERRING DIAG: s/p Right Reverse Total shoulder arthroplasty (REVISION) 10/23/2021  THERAPY DIAG:  Muscle weakness (generalized)  Right shoulder pain, unspecified chronicity  Limited range of motion (ROM) of shoulder  S/P reverse total shoulder arthroplasty, right  Difficulty in walking, not elsewhere classified  Schizoaffective disorder, unspecified type ([place])  Rationale for Evaluation and Treatment Rehabilitation  ONSET DATE: 10/23/2021  SUBJECTIVE:  Pt reports shoulder is feeling great- No soreness from yesterday's visit.   PERTINENT HISTORY: History taken per MD note on 6/16 Victorino December, MD)  Amybeth Sieg is a 76 y.o. female who complains of right shoulder pain and dysfunction following a elective reverse arthroplasty performed over in [place] a number of months ago.  She had early failure of the baseplate with loosening and now malposition  PAIN:  Are you having pain? Yes: NPRS scale: 0/10 Pain  location: superior and ant shoulder Pain description: ache mostly; but sharp with movement Aggravating factors: active movement; reaching Relieving factors: Rest; Ice; tramadol  PRECAUTIONS: Shoulder- in demographics section   PATIENT GOALS  I want to be able to reach the top of head and also paint as well  OBJECTIVE:   TODAY'S TREATMENT: 01/14/22   PROM to Right Shoulder- Flex/ABD/ER/IR x 10 min Grade 2-3 inf GH mobs x 1 min Grade 2 PA/AP GH mobs x 1 min   THEREX:  R shoulder  Supine Scaption AROM x 2 sets of 12 reps (shaky but able to complete with more ROM)  Supine Shoulder AROM x 2 sets of 10 reps   Sidelye Shoulder flex AROM  2 x 12 reps  Supine shoulder ER (arm positioned at 45 deg abd and elbow on towel roll) - YTB 2 sets of 12 reps  Sidelye R shoulder ER: Required max TC's for form/technique. AAROM. VC's for eccentric descent. 2x10. Updated HEP to this regression.  Supine serratus punch AROM 2x15 reps - VC/TC for technique (min assist to hold at elbow to maintain straight elbow)   Standing scapular retractions with GTB, 2x15 Reps  Seated scapular mobilization- AROM (protraction/retraction/Elevation/depression)  2 sets x 12 reps  Bicep curl- 3# x 12 reps x 2 sets   PATIENT EDUCATION: Education details: Exercise technique; progression of HEP Person educated: Patient Education method: Explanation, Demonstration, Tactile cues, and Verbal cues Education comprehension: verbalized understanding, returned demonstration, verbal cues required, tactile cues required, and needs further education   HOME EXERCISE PROGRAM: Access Code: [email or web address] URL: [email or web address] Date: 12/24/2021 Prepared by: Sande Brothers  Exercises - Supine Shoulder Flexion Extension AAROM with Dowel  - 3 x weekly - 3 sets - 10 reps - Supine Shoulder External Rotation in 45 Degrees Abduction AAROM with Dowel  - 3 x weekly - 3 sets - 10 reps - Supine Shoulder Scaption with  Dowel  - 3 x weekly - 3 sets - 10 reps - Scapular Retraction with Resistance  - 1 x daily - 3 x weekly - 3 sets - 10 reps - Shoulder External Rotation and Scapular Retraction with Resistance  - 1 x daily - 3 x weekly - 3 sets - 10 reps - Standing Serratus Punch with Resistance  - 1 x daily - 3 x weekly - 3 sets - 10 reps - Standing Single Arm Elbow Flexion with Resistance  - 1 x daily - 3 x weekly - 3 sets - 10 reps - Standing Shoulder Shrugs  - 1 x daily - 3 x weekly - 3 sets - 10 reps     Access Code: [email or web address] URL: [email or web address] Date: 12/16/2021 Prepared by: Sande Brothers  Exercises - Supine Shoulder Flexion AAROM with Hands Clasped  - 1 x daily - 3 sets - 10 reps - Supine Shoulder Flexion Extension Full Range AROM  - 1 x daily - 3 sets - 10 reps - Supine Shoulder Internal  Rotation  - 1 x daily - 3 sets - 10 reps - Supine Shoulder External Rotation Stretch  - 1 x daily - 3 sets - 10 reps - Supine Scapular Retraction  - 1 x daily - 3 sets - 10 reps  Reviewed active elbow flex/ext/forearm sup/pronation/wrist ext/flex    ASSESSMENT:  CLINICAL IMPRESSION: Pt well motivated overall for today's session. She was able to demo good ability to raise her arm in supine with decreased UT compensation. She was able to use some resistance with supine ER activity but more difficulty with sidelye position. She denied any pain throughout session and progressing per protocol well.  Pt will benefit from PT services to address deficits in ROM, strength, mobility, and pain in order to return to full function at home with less shoulder pain.  OBJECTIVE IMPAIRMENTS decreased activity tolerance, decreased mobility, decreased ROM, decreased strength, decreased safety awareness, hypomobility, impaired flexibility, impaired UE functional use, postural dysfunction, and pain.   ACTIVITY LIMITATIONS carrying, lifting, sleeping, bathing, dressing, reach over head, and  hygiene/grooming  PARTICIPATION LIMITATIONS: meal prep, cleaning, laundry, driving, shopping, community activity, and yard work  PERSONAL FACTORS Behavior pattern and 1-2 comorbidities: COPD, Mult shoulder surgeries  are also affecting patient's functional outcome.   REHAB POTENTIAL: Good  CLINICAL DECISION MAKING: Evolving/moderate complexity  EVALUATION COMPLEXITY: Low   GOALS: Goals reviewed with patient? Yes  SHORT TERM GOALS: Target date: 02/02/2022  (Remove Blue Hyperlink)  Pt will be independent with initial HEP in order to improve strength and decrease pain in order to improve pain-free function at home and work. Baseline: 12/04/2021- No formal HEP in place Goal status: INITIAL   LONG TERM GOALS: Target date: 03/16/2022  (Remove Blue Hyperlink)  Pt will be independent with Final HEP in order to improve strength and decrease pain in order to improve pain-free function at home and work. Baseline: 12/04/2021- No formal HEP in place Goal status: INITIAL  2.  Pt will decrease quick DASH score by at least 8% in order to demonstrate clinically significant reduction in disability. Baseline: 12/04/2021= 61 Goal status: INITIAL  3.  Pt will improve FOTO to target score of 51% to display perceived improvements in ability to complete ADL's.  Baseline: 12/04/2021=40 Goal status: INITIAL  4.  Pt will decrease worst pain as reported on NPRS by at least 3 points in order to demonstrate clinically significant reduction in pain. Baseline: 12/04/2021= 6/10 Right shoulder pain Goal status: INITIAL  5.  Patient will demo > 140 deg of right shoulder elevation (PROM) for improved ROM and overhead activities Baseline: 12/04/2021=PROM R shoulder flex- 118; 105 ABD Goal status: INITIAL  6.  Patient will demo > 100 deg of right shoulder elevation (AROM) for improved ROM and overhead activities Baseline: 12/04/2021=PROM R shoulder flex- 118; 105 ABD Goal status: INITIAL   PLAN: PT FREQUENCY:  2x/week  PT DURATION: 12 weeks  PLANNED INTERVENTIONS: Therapeutic exercises, Therapeutic activity, Patient/Family education, Self Care, Joint mobilization, Dry Needling, Spinal manipulation, Spinal mobilization, Cryotherapy, Moist heat, scar mobilization, Taping, Ultrasound, and Manual therapy  PLAN FOR NEXT SESSION: Manual Therapy for ROM and advance HEP as appropriate.   Ollen Bowl, PT Physical Therapist- [facility]  01/14/22, 1:37 PM

## 2022-01-15 ENCOUNTER — Ambulatory Visit: Payer: Medicare Other | Admitting: Physician Assistant

## 2022-01-15 NOTE — Therapy (Signed)
OUTPATIENT OCCUPATIONAL THERAPY TREATMENT  Patient Name: Angela Fernandez MRN: [id number] DOB:03/28/1946, 76 y.o., female Today's Date: 01/15/2022  PCP: Dr. Leonel Ramsay REFERRING PROVIDER: Dr. Norman Clay   OT End of Session - 01/15/22 1924     Visit Number 9    Number of Visits 12    Date for OT Re-Evaluation 02/04/22    OT Start Time 7829    OT Stop Time 1230    OT Time Calculation (min) 45 min    Activity Tolerance Patient tolerated treatment well    Behavior During Therapy WFL for tasks assessed/performed              Past Medical History:  Diagnosis Date   Anemia    Arthritis    Bipolar affect, depressed ([place])    COPD (chronic obstructive pulmonary disease) ([place])    Diabetes mellitus without complication ([place])    type II   GERD (gastroesophageal reflux disease)    Hyperlipidemia    Hypertension    Hypothyroidism    PTSD (post-traumatic stress disorder)    Schizoaffective disorder ([place])    Sleep apnea    cpap   Stroke ([place])    hx of  mini stroke - 2001   TIA (transient ischemic attack) 2010   UTI (urinary tract infection)    Past Surgical History:  Procedure Laterality Date   BILATERAL CARPAL TUNNEL RELEASE Bilateral    BREAST BIOPSY     BREAST SURGERY Right    lumpectomy   CATARACT EXTRACTION W/ INTRAOCULAR LENS  IMPLANT, BILATERAL Bilateral    CHOLECYSTECTOMY     COLONOSCOPY     ESOPHAGOGASTRODUODENOSCOPY     EYE SURGERY     JOINT REPLACEMENT     NASAL SINUS SURGERY     REPLACEMENT TOTAL KNEE BILATERAL Bilateral    REVERSE SHOULDER ARTHROPLASTY Right 09/02/2020   Procedure: REVERSE SHOULDER ARTHROPLASTY;  Surgeon: Corky Mull, MD;  Location: [facility];  Service: Orthopedics;  Laterality: Right;   SHOULDER SURGERY Right 2020   rotator cuff repair   TOTAL SHOULDER REVISION Right 10/23/2021   Procedure: REVERSE SHOULDER REVISION;  Surgeon: Nicholes Stairs, MD;  Location: [facility];  Service: Orthopedics;  Laterality: Right;  180   Patient  Active Problem List   Diagnosis Date Noted   Hx of sleep apnea 12/21/2021   S/P reverse total shoulder arthroplasty, right 10/23/2021   Cystocele with rectocele 04/10/2021   Mixed urinary incontinence due to female genital prolapse 04/10/2021   Submucous leiomyoma of uterus 04/10/2021   Thickened endometrium 04/10/2021   Pelvic pressure in female 02/11/2021   DJD (degenerative joint disease) of cervical spine 12/28/2020   Anxiety, generalized 12/28/2020   History of depression 12/28/2020   Memory difficulty 12/28/2020   Chronic venous insufficiency 12/24/2020   Lymphedema 12/24/2020   Diabetes ([place]) 12/24/2020   Hyperlipidemia 12/24/2020   Essential hypertension 12/24/2020   Closed fracture of neck of right scapula 10/17/2020   Status post reverse total shoulder replacement, right 09/02/2020   Rotator cuff arthropathy, right 07/14/2020    ONSET DATE: 10/13/21 (referral date)  REFERRING DIAG: Cognitive decline, schizoaffective disorder, unspecified type  THERAPY DIAG:  Schizoaffective disorder, unspecified type ([place])  Cognitive decline  Rationale for Evaluation and Treatment Rehabilitation  SUBJECTIVE: Pt reports she moved into her house this week with her granddaughter and granddaughter's boyfriend.  Pt reports that she is really happy with the move. Pt accompanied by: self  PERTINENT HISTORY: Per chart, Schizoaffective disorder, PTSD, anxiety,  depression, insomnia, sleep apnea, occasional passive SI, occasional AH (voices); Recent R reverse TSA on 10/23/21.  Per pt, she moved from [place], approximately 1 year and 4-5 months ago to move in with her daughter and granddaughter.  Pt reports she is actively looking for a house to move out, with plans to have granddaughter live with her.  Pt reports being eager to be more indep in her living situation.  PAIN:  Are you having pain? Pt reports 7-8/10 pain in bilat feet, back, and neck.  Rest helps to ease pain.   PATIENT GOALS : to  move out and live more independently, possibly with granddaughter living with her.    OBJECTIVE:  Self Care:  Pt worked on Associate Professor with emphasis on looking up recipes to return to cooking now that she has her own kitchen again.  Pt with good recall of search strategies and required cues for navigating through a recipe on a website to sort through necessary and unnecessary information, including ads, videos, etc.  Pt was able to look up 2 dinner recipes with min vc, and then mod vc to navigate the recipe website.  Provided education on organizational strategies for using a recipe from cell phone; pt took notes from recipe for a hard copy to minimize cell phone navigation during meal prep which could be quite disruptive to the flow of her meal prep, as pt is still requiring sequencing cues for searching topics on cell phone.  Made recommendation for pt to use an actual cookbook for now and continue to practice searching for desired topics on her cell phone to increase indep with cell phone management skills.  Pt receptive.   PATIENT EDUCATION: Education details: navigating cell phone to edit contact information and search businesses for addresses and phone numbers Person educated: Patient Education method: Explanation, demonstration  Education comprehension: verbalized understanding, mod A  GOALS: Goals reviewed with patient? Yes  SHORT TERM GOALS: Target date: 12/24/21    Pt will utilize visual reminders to independently identify emergency numbers and frequent contacts (ie family members, MD offices) to call when in need of assistance. Baseline: Pt was unsure whether "911" was the correct number to call for a house fire and reported being unable to search for information on her phone to identify people/places. Goal status: INITIAL  2.  Pt will be indep to address an envelope for mailing a bill or letter. Baseline: Pt forgetful to her zip code and left off the PO box number on a  simulated utility bill. Goal status: INITIAL  3. Pt will complete Pill Box Assessment Test to determine level of assist needed for medication management.  Baseline: Not yet completed.  Goal status: Initial  4. Pt will develop organizational system (Ie: calendar/chart) to identify when monthly bills are due for timely bill paying.  Baseline: Not yet initiated.  Goal status: Initial   LONG TERM GOALS: Target date: 02/04/22    Pt will write checks for monthly bills with min supv for accuracy and completion.  Baseline: Daughter currently manages bills. Goal status: INITIAL  2.  Pt will manage medications with mod A. (May revise supervision level following completion of Pill Box assessment) Baseline: Daughter currently manages medications Goal status: INITIAL  3.  Pt will be indep to utilize smart phone to look up desired information for increasing community engagement and socialization (locations and phone numbers for local restaurants, movie theaters, parks) Baseline: Pt reports she does not know how to look up  information on her smart phone (Brief instruction given at eval but additional training needed). Goal status: INITIAL  4.  Pt will demo indep with use of map quest/google maps or a like app to increase indep with community mobility. Baseline: Pt can drive to a few familiar locations (granddaughter's place of work, MD offices), but otherwise does not know how to utilize phone to access new locations. Goal status: INITIAL  ASSESSMENT:  CLINICAL IMPRESSION: Pt reports that she's moved into her new house this week with her granddaughter and granddaughter's boyfriend.  Pt is happy with her place so far.  Pt reports that she was successful to make an appointment for her car at the dealership which she identified last session during OT visit, also same location that her family uses.  Pt hoping to get her car fixed for more freedom to access the community, but until then, pt using  transportation services.  Pt worked on Associate Professor with emphasis on looking up recipes to return to cooking now that she has her own kitchen again.  Pt with good recall of search strategies and required cues for navigating through a recipe on a website to sort through necessary and unnecessary information, including ads, videos, etc.  Pt was able to look up 2 dinner recipes with min vc after initial demo, and then mod vc to navigate the recipe website.  Provided education on organizational strategies for using a recipe from cell phone; pt took notes from recipe for a hard copy to minimize cell phone navigation during meal prep which could be quite disruptive to the flow of her meal prep, as pt is still requiring sequencing cues for searching topics on cell phone.  Made recommendation for pt to use an actual cookbook for now and continue to practice at home to search for desired topics on her cell phone to increase indep with cell phone management skills.  Pt receptive.  Pt will benefit from continued OT to work towards goals in plan of care to maximize safety and independence in necessary daily tasks.   PERFORMANCE DEFICITS in functional skills including IADLs and pain, cognitive skills including memory and information processing , and psychosocial skills including environmental adaptation and interpersonal interactions.   IMPAIRMENTS are limiting patient from IADLs, leisure, and social participation.   COMORBIDITIES may have co-morbidities  that affects occupational performance. Patient will benefit from skilled OT to address above impairments and improve overall function.  MODIFICATION OR ASSISTANCE TO COMPLETE EVALUATION: Min-Moderate modification of tasks or assist with assess necessary to complete an evaluation.  OT OCCUPATIONAL PROFILE AND HISTORY: Detailed assessment: Review of records and additional review of physical, cognitive, psychosocial history related to current functional  performance.  CLINICAL DECISION MAKING: Moderate - several treatment options, min-mod task modification necessary  REHAB POTENTIAL: Good  EVALUATION COMPLEXITY: High   PLAN: OT FREQUENCY: 1x/week  OT DURATION: 12 weeks  PLANNED INTERVENTIONS: self care/ADL training, therapeutic activity, cognitive remediation/compensation, and psychosocial skills training  RECOMMENDED OTHER SERVICES: PT for shoulder rehab (referral requested and faxed to surgeon)  CONSULTED AND AGREED WITH PLAN OF CARE: Patient  PLAN FOR NEXT SESSION: FOTO completion, IADL training (see goals)   Leta Speller, MS, OTR/L   Darleene Cleaver, OT 01/15/2022, 7:25 PM

## 2022-01-18 ENCOUNTER — Ambulatory Visit (INDEPENDENT_AMBULATORY_CARE_PROVIDER_SITE_OTHER): Payer: Medicare Other | Admitting: Physician Assistant

## 2022-01-18 ENCOUNTER — Encounter: Payer: Self-pay | Admitting: Physician Assistant

## 2022-01-18 VITALS — BP 96/63 | HR 79 | Ht 65.5 in | Wt 186.0 lb

## 2022-01-18 DIAGNOSIS — Z87440 Personal history of urinary (tract) infections: Secondary | ICD-10-CM | POA: Diagnosis not present

## 2022-01-18 DIAGNOSIS — R3915 Urgency of urination: Secondary | ICD-10-CM

## 2022-01-18 LAB — BLADDER SCAN AMB NON-IMAGING

## 2022-01-18 MED ORDER — GEMTESA 75 MG PO TABS
75.0000 mg | ORAL_TABLET | Freq: Every day | ORAL | 11 refills | Status: DC
Start: 2022-01-18 — End: 2022-11-29

## 2022-01-18 NOTE — Progress Notes (Signed)
01/18/2022 2:05 PM   Angela Fernandez 02-21-1946 [id number]  CC: Chief Complaint  Patient presents with   Follow-up   HPI: Angela Fernandez is a 76 y.o. female with PMH recurrent UTI on suppressive trimethoprim per Dr. Matilde Sprang, OAB, and cystocele who presents today for symptom recheck on Gemtesa.   Today she reports she is doing "wonderful" on [place]  She states that her suprapubic pressure have resolved entirely.  She denies urinary leaks.  She now voids 2-5 times during the day and has nocturia x0.  She is overall very pleased.  PVR 27 mL.  PMH: Past Medical History:  Diagnosis Date   Anemia    Arthritis    Bipolar affect, depressed ([place])    COPD (chronic obstructive pulmonary disease) ([place])    Diabetes mellitus without complication (HCC)    type II   GERD (gastroesophageal reflux disease)    Hyperlipidemia    Hypertension    Hypothyroidism    PTSD (post-traumatic stress disorder)    Schizoaffective disorder ([place])    Sleep apnea    cpap   Stroke ([place])    hx of  mini stroke - 2001   TIA (transient ischemic attack) 2010   UTI (urinary tract infection)     Surgical History: Past Surgical History:  Procedure Laterality Date   BILATERAL CARPAL TUNNEL RELEASE Bilateral    BREAST BIOPSY     BREAST SURGERY Right    lumpectomy   CATARACT EXTRACTION W/ INTRAOCULAR LENS  IMPLANT, BILATERAL Bilateral    CHOLECYSTECTOMY     COLONOSCOPY     ESOPHAGOGASTRODUODENOSCOPY     EYE SURGERY     JOINT REPLACEMENT     NASAL SINUS SURGERY     REPLACEMENT TOTAL KNEE BILATERAL Bilateral    REVERSE SHOULDER ARTHROPLASTY Right 09/02/2020   Procedure: REVERSE SHOULDER ARTHROPLASTY;  Surgeon: Corky Mull, MD;  Location: [facility] ORS;  Service: Orthopedics;  Laterality: Right;   SHOULDER SURGERY Right 2020   rotator cuff repair   TOTAL SHOULDER REVISION Right 10/23/2021   Procedure: REVERSE SHOULDER REVISION;  Surgeon: Nicholes Stairs, MD;  Location: [facility];  Service: Orthopedics;   Laterality: Right;  180    Home Medications:  Allergies as of 01/18/2022   No Known Allergies      Medication List        Accurate as of January 18, 2022  2:05 PM. If you have any questions, ask your nurse or doctor.          albuterol 108 (90 Base) MCG/ACT inhaler Commonly known as: VENTOLIN HFA Inhale 2 puffs into the lungs every 6 (six) hours as needed for wheezing or shortness of breath.   amoxicillin 500 MG capsule Commonly known as: AMOXIL Take by mouth.   Anoro Ellipta 62.5-25 MCG/ACT Aepb Generic drug: umeclidinium-vilanterol Inhale 2 puffs into the lungs daily as needed (shortness of breath).   AYR SALINE NASAL GEL NA Place 1 application. into the nose every other day.   buPROPion 200 MG 12 hr tablet Commonly known as: WELLBUTRIN SR Take 200 mg by mouth 2 (two) times daily.   clonazePAM 1 MG tablet Commonly known as: KLONOPIN Take 1 tablet (1 mg total) by mouth 3 (three) times daily as needed for anxiety.   desvenlafaxine 50 MG 24 hr tablet Commonly known as: PRISTIQ Take 1 tablet by mouth at bedtime.   Desvenlafaxine ER 50 MG Tb24 Commonly known as: PRISTIQ Take 1 tablet by mouth at bedtime. psych   docusate  sodium 100 MG capsule Commonly known as: COLACE Take 200 mg by mouth daily. 2 tabs in the am   fenofibrate 145 MG tablet Commonly known as: TRICOR Take 1 tablet (145 mg total) by mouth daily. 4pm   ferrous sulfate 325 (65 FE) MG tablet Take 1 tablet (325 mg total) by mouth daily with breakfast.   gabapentin 300 MG capsule Commonly known as: NEURONTIN Take 300 mg by mouth at bedtime.   Gemtesa 75 MG Tabs Generic drug: Vibegron Take 75 mg by mouth daily.   Lantus SoloStar 100 UNIT/ML Solostar Pen Generic drug: insulin glargine Inject 24 Units into the skin daily.   Latuda 120 MG Tabs Generic drug: Lurasidone HCl Take 120 mg by mouth daily.   levothyroxine 175 MCG tablet Commonly known as: SYNTHROID Take 1 tablet (175 mcg  total) by mouth daily before breakfast.   lisinopril 5 MG tablet Commonly known as: ZESTRIL Take 1 tablet (5 mg total) by mouth daily.   meloxicam 7.5 MG tablet Commonly known as: MOBIC Take 1 tablet (7.5 mg total) by mouth daily.   metoprolol succinate 100 MG 24 hr tablet Commonly known as: TOPROL-XL Take 1 tablet (100 mg total) by mouth daily. Take with or immediately following a meal.   omeprazole 40 MG capsule Commonly known as: PRILOSEC Take 1 capsule (40 mg total) by mouth daily.   ondansetron 4 MG tablet Commonly known as: ZOFRAN Take 4 mg by mouth 2 (two) times daily as needed for nausea or vomiting.   OVER THE COUNTER MEDICATION Take 1 Scoop by mouth daily in the afternoon. Collagen protein powder   oxyCODONE-acetaminophen 7.5-325 MG tablet Commonly known as: Percocet Take 1 tablet by mouth every 6 (six) hours as needed for severe pain.   PreserVision AREDS 2 Caps Take 1 capsule by mouth 2 (two) times daily.   Hair/Skin/Nails Tabs Take 1 tablet by mouth daily.   simvastatin 20 MG tablet Commonly known as: ZOCOR Take 1 tablet (20 mg total) by mouth daily.   tiZANidine 4 MG tablet Commonly known as: ZANAFLEX Take 2-4 mg by mouth every 8 (eight) hours as needed for muscle spasms.   traMADol 50 MG tablet Commonly known as: ULTRAM Take 50 mg by mouth every 6 (six) hours as needed.   traZODone 50 MG tablet Commonly known as: DESYREL Take 50 mg by mouth at bedtime. Take one tablet at bedtime/ psych   trimethoprim 100 MG tablet Commonly known as: TRIMPEX Take 1 tablet (100 mg total) by mouth daily.   Trulicity 5.46 EV/0.3JK Sopn Generic drug: Dulaglutide Inject 0.75 mg into the skin once a week.   VITAMIN E (TOPICAL) Crea Apply 1 application. topically daily.   zolpidem 5 MG tablet Commonly known as: AMBIEN Take 1 tablet (5 mg total) by mouth at bedtime as needed for sleep.        Allergies:  No Known Allergies  Family History: Family History   Problem Relation Age of Onset   Alzheimer's disease Mother    Alcohol abuse Father    Schizophrenia Father    Pancreatitis Father    Alcohol abuse Maternal Grandfather    Alzheimer's disease Maternal Grandmother     Social History:   reports that she quit smoking about 2 years ago. Her smoking use included cigarettes. She has never used smokeless tobacco. She reports that she does not currently use alcohol. She reports current drug use.  Physical Exam: BP 96/63   Pulse 79   Ht 5' 5.5" (  1.664 m)   Wt 186 lb (84.4 kg)   BMI 30.48 kg/m   Constitutional:  Alert and oriented, no acute distress, nontoxic appearing HEENT: [place], AT Cardiovascular: No clubbing, cyanosis, or edema Respiratory: Normal respiratory effort, no increased work of breathing Skin: No rashes, bruises or suspicious lesions Neurologic: Grossly intact, no focal deficits, moving all 4 extremities Psychiatric: Normal mood and affect  Laboratory Data: Results for orders placed or performed in visit on 01/18/22  Bladder Scan (Post Void Residual) in office  Result Value Ref Range   Scan Result 29m    Assessment & Plan:   1. Urinary urgency Significant symptomatic improvement on Gemtesa.  She is emptying appropriately.  We will plan to continue this.  She is in agreement with this plan. - Bladder Scan (Post Void Residual) in office - Vibegron (GEMTESA) 75 MG TABS; Take 75 mg by mouth daily.  Dispense: 30 tablet; Refill: 11   Return in about 1 year (around 01/19/2023) for Annual OAB f/u with P[facility].  SDebroah Loop PA-C  [facility] [address] [place] [phone]

## 2022-01-19 ENCOUNTER — Encounter: Payer: Self-pay | Admitting: Physical Therapy

## 2022-01-19 ENCOUNTER — Ambulatory Visit: Payer: Medicare Other

## 2022-01-19 DIAGNOSIS — M9931 Osseous stenosis of neural canal of cervical region: Secondary | ICD-10-CM | POA: Diagnosis not present

## 2022-01-19 DIAGNOSIS — M48062 Spinal stenosis, lumbar region with neurogenic claudication: Secondary | ICD-10-CM | POA: Diagnosis not present

## 2022-01-19 DIAGNOSIS — R262 Difficulty in walking, not elsewhere classified: Secondary | ICD-10-CM | POA: Diagnosis not present

## 2022-01-19 DIAGNOSIS — M5412 Radiculopathy, cervical region: Secondary | ICD-10-CM | POA: Diagnosis not present

## 2022-01-19 DIAGNOSIS — M5442 Lumbago with sciatica, left side: Secondary | ICD-10-CM | POA: Diagnosis not present

## 2022-01-19 DIAGNOSIS — M6281 Muscle weakness (generalized): Secondary | ICD-10-CM

## 2022-01-19 DIAGNOSIS — G8929 Other chronic pain: Secondary | ICD-10-CM | POA: Diagnosis not present

## 2022-01-19 DIAGNOSIS — M25619 Stiffness of unspecified shoulder, not elsewhere classified: Secondary | ICD-10-CM | POA: Diagnosis not present

## 2022-01-19 DIAGNOSIS — M5441 Lumbago with sciatica, right side: Secondary | ICD-10-CM | POA: Diagnosis not present

## 2022-01-19 DIAGNOSIS — Z96611 Presence of right artificial shoulder joint: Secondary | ICD-10-CM | POA: Diagnosis not present

## 2022-01-19 DIAGNOSIS — M25511 Pain in right shoulder: Secondary | ICD-10-CM | POA: Diagnosis not present

## 2022-01-19 DIAGNOSIS — M542 Cervicalgia: Secondary | ICD-10-CM | POA: Diagnosis not present

## 2022-01-19 NOTE — Therapy (Signed)
OUTPATIENT PHYSICAL THERAPY SHOULDER TREATMENT/PHYSICAL THERAPY PROGRESS NOTE   Dates of reporting period  12/04/21   to   01/19/22    Patient Name: Angela Fernandez MRN: [id number] DOB:[birth year], 76 y.o., female Today's Date: 01/19/2022   PT End of Session - 01/19/22 1342     Visit Number 10    Number of Visits 25    Date for PT Re-Evaluation 02/26/22    Authorization Time Period Initial PT cert 7/61/9509- 32/67/1245    Progress Note Due on Visit 10    PT Start Time 0145    PT Stop Time 0230    PT Time Calculation (min) 45 min    Activity Tolerance Patient tolerated treatment well    Behavior During Therapy WFL for tasks assessed/performed              Past Medical History:  Diagnosis Date   Anemia    Arthritis    Bipolar affect, depressed ([place])    COPD (chronic obstructive pulmonary disease) ([place])    Diabetes mellitus without complication ([place])    type II   GERD (gastroesophageal reflux disease)    Hyperlipidemia    Hypertension    Hypothyroidism    PTSD (post-traumatic stress disorder)    Schizoaffective disorder ([place])    Sleep apnea    cpap   Stroke ([place])    hx of  mini stroke - 2001   TIA (transient ischemic attack) 2010   UTI (urinary tract infection)    Past Surgical History:  Procedure Laterality Date   BILATERAL CARPAL TUNNEL RELEASE Bilateral    BREAST BIOPSY     BREAST SURGERY Right    lumpectomy   CATARACT EXTRACTION W/ INTRAOCULAR LENS  IMPLANT, BILATERAL Bilateral    CHOLECYSTECTOMY     COLONOSCOPY     ESOPHAGOGASTRODUODENOSCOPY     EYE SURGERY     JOINT REPLACEMENT     NASAL SINUS SURGERY     REPLACEMENT TOTAL KNEE BILATERAL Bilateral    REVERSE SHOULDER ARTHROPLASTY Right 09/02/2020   Procedure: REVERSE SHOULDER ARTHROPLASTY;  Surgeon: Corky Mull, MD;  Location: [facility] ORS;  Service: Orthopedics;  Laterality: Right;   SHOULDER SURGERY Right 2020   rotator cuff repair   TOTAL SHOULDER REVISION Right 10/23/2021   Procedure: REVERSE  SHOULDER REVISION;  Surgeon: Nicholes Stairs, MD;  Location: [facility];  Service: Orthopedics;  Laterality: Right;  180   Patient Active Problem List   Diagnosis Date Noted   Hx of sleep apnea 12/21/2021   S/P reverse total shoulder arthroplasty, right 10/23/2021   Cystocele with rectocele 04/10/2021   Mixed urinary incontinence due to female genital prolapse 04/10/2021   Submucous leiomyoma of uterus 04/10/2021   Thickened endometrium 04/10/2021   Pelvic pressure in female 02/11/2021   DJD (degenerative joint disease) of cervical spine 12/28/2020   Anxiety, generalized 12/28/2020   History of depression 12/28/2020   Memory difficulty 12/28/2020   Chronic venous insufficiency 12/24/2020   Lymphedema 12/24/2020   Diabetes ([place]) 12/24/2020   Hyperlipidemia 12/24/2020   Essential hypertension 12/24/2020   Closed fracture of neck of right scapula 10/17/2020   Status post reverse total shoulder replacement, right 09/02/2020   Rotator cuff arthropathy, right 07/14/2020    PCP: Adrian Prows  REFERRING PROVIDER: Dr. Victorino December ([facility])  REFERRING DIAG: s/p Right Reverse Total shoulder arthroplasty (REVISION) 10/23/2021  THERAPY DIAG:  Difficulty in walking, not elsewhere classified  Muscle weakness (generalized)  Rationale for Evaluation and Treatment Rehabilitation  ONSET  DATE: 10/23/2021  SUBJECTIVE:  Pt reports she is feeling stiff because she had an appointment this morning and after that has been sitting around.  Pt notes she has not been able to perform her HEP due to having to moving the past two weeks.    PERTINENT HISTORY: History taken per MD note on 6/16 Victorino December, MD)  Angela Fernandez is a 76 y.o. female who complains of right shoulder pain and dysfunction following a elective reverse arthroplasty performed over in [place] a number of months ago.  She had early failure of the baseplate with loosening and now malposition  PAIN:  Are you having pain?  Yes: NPRS scale: 5/10 Pain location: R Shoulder superior and ant shoulder Pain description: ache mostly; but sharp with movement Aggravating factors: active movement; reaching Relieving factors: Rest; Ice; tramadol  PRECAUTIONS: Shoulder- in demographics section   PATIENT GOALS  I want to be able to reach the top of head and also paint as well    OBJECTIVE:   TODAY'S TREATMENT: 01/14/22  PROM to Right Shoulder- Flex/ABD/ER/IR x 10 min Grade 2-3 inf GH mobs x 1 min Grade 2 PA/AP GH mobs x 1 min   THEREX:  R shoulder  Supine Scaption AROM x 2 sets of 12 reps (shaky but able to complete with more ROM)  Supine Shoulder AROM x 2 sets of 10 reps    Supine D1/D2 Pattern's 2x12 each direction  Sidelying Shoulder flex AROM  2 x 12 reps  Supine shoulder ER (arm positioned at 45 deg abd and elbow on towel roll) - YTB 2 sets of 12 reps  Matrix ER resisted walkouts, 2x10, 5# (2.5# plate and added 9.5# AW) Matrix IR resisted walkouts, 2x10, 7.5#   Pt noted to be feeling a little lightheaded during resisted IR walkouts at matrix machine so vitals were assessed  BP: 143/62 mmHg HR: 59  Blood Sugar unable to be obtained and pt notes she was unable to eat breakfast or lunch prior to attending therapy due to being too busy and too many people in her home.      PATIENT EDUCATION:  Education details: Exercise technique; progression of HEP Person educated: Patient Education method: Explanation, Demonstration, Tactile cues, and Verbal cues Education comprehension: verbalized understanding, returned demonstration, verbal cues required, tactile cues required, and needs further education   HOME EXERCISE PROGRAM: Access Code: [email or web address] URL: [email or web address] Date: 12/24/2021 Prepared by: Sande Brothers  Exercises - Supine Shoulder Flexion Extension AAROM with Dowel  - 3 x weekly - 3 sets - 10 reps - Supine Shoulder External Rotation in 45 Degrees Abduction  AAROM with Dowel  - 3 x weekly - 3 sets - 10 reps - Supine Shoulder Scaption with Dowel  - 3 x weekly - 3 sets - 10 reps - Scapular Retraction with Resistance  - 1 x daily - 3 x weekly - 3 sets - 10 reps - Shoulder External Rotation and Scapular Retraction with Resistance  - 1 x daily - 3 x weekly - 3 sets - 10 reps - Standing Serratus Punch with Resistance  - 1 x daily - 3 x weekly - 3 sets - 10 reps - Standing Single Arm Elbow Flexion with Resistance  - 1 x daily - 3 x weekly - 3 sets - 10 reps - Standing Shoulder Shrugs  - 1 x daily - 3 x weekly - 3 sets - 10 reps     Access Code: [email or web address] URL: [email or web address] Date: 12/16/2021 Prepared by: Dellis Filbert  [place]  Exercises - Supine Shoulder Flexion AAROM with Hands Clasped  - 1 x daily - 3 sets - 10 reps - Supine Shoulder Flexion Extension Full Range AROM  - 1 x daily - 3 sets - 10 reps - Supine Shoulder Internal Rotation  - 1 x daily - 3 sets - 10 reps - Supine Shoulder External Rotation Stretch  - 1 x daily - 3 sets - 10 reps - Supine Scapular Retraction  - 1 x daily - 3 sets - 10 reps  Reviewed active elbow flex/ext/forearm sup/pronation/wrist ext/flex    ASSESSMENT:  CLINICAL IMPRESSION:  Goal assessment was going to be attempted to be performed, however pt was feeling light headed and noted that she had not been able to eat anything this morning for breakfast or lunch prior to coming to therapy.  Therapy was ceased and pt was given peanut butter, graham crackers, and water to assist with blood sugar as pt notes she has had issues with this in the past.  She did not have her glucometer, however other vitals were assessed and noted above.  Will re-attempt to assess goals at next visit with primary therapist if possible.   Pt will continue to benefit from skilled therapy to address remaining deficits in order to improve overall QoL and return to PLOF.       OBJECTIVE IMPAIRMENTS decreased activity tolerance,  decreased mobility, decreased ROM, decreased strength, decreased safety awareness, hypomobility, impaired flexibility, impaired UE functional use, postural dysfunction, and pain.   ACTIVITY LIMITATIONS carrying, lifting, sleeping, bathing, dressing, reach over head, and hygiene/grooming  PARTICIPATION LIMITATIONS: meal prep, cleaning, laundry, driving, shopping, community activity, and yard work  PERSONAL FACTORS Behavior pattern and 1-2 comorbidities: COPD, Mult shoulder surgeries  are also affecting patient's functional outcome.   REHAB POTENTIAL: Good  CLINICAL DECISION MAKING: Evolving/moderate complexity  EVALUATION COMPLEXITY: Low   GOALS: Goals reviewed with patient? Yes  SHORT TERM GOALS: Target date: 02/02/2022  (Remove Blue Hyperlink)  Pt will be independent with initial HEP in order to improve strength and decrease pain in order to improve pain-free function at home and work. Baseline: 12/04/2021- No formal HEP in place Goal status: INITIAL   LONG TERM GOALS: Target date: 03/16/2022  (Remove Blue Hyperlink)  Pt will be independent with Final HEP in order to improve strength and decrease pain in order to improve pain-free function at home and work. Baseline: 12/04/2021- No formal HEP in place Goal status: INITIAL  2.  Pt will decrease quick DASH score by at least 8% in order to demonstrate clinically significant reduction in disability. Baseline: 12/04/2021= 61 Goal status: INITIAL  3.  Pt will improve FOTO to target score of 51% to display perceived improvements in ability to complete ADL's.  Baseline: 12/04/2021=40 Goal status: INITIAL  4.  Pt will decrease worst pain as reported on NPRS by at least 3 points in order to demonstrate clinically significant reduction in pain. Baseline: 12/04/2021= 6/10 Right shoulder pain Goal status: INITIAL  5.  Patient will demo > 140 deg of right shoulder elevation (PROM) for improved ROM and overhead activities Baseline:  12/04/2021=PROM R shoulder flex- 118; 105 ABD Goal status: INITIAL  6.  Patient will demo > 100 deg of right shoulder elevation (AROM) for improved ROM and overhead activities Baseline: 12/04/2021=PROM R shoulder flex- 118; 105 ABD Goal status: INITIAL   PLAN: PT FREQUENCY: 2x/week  PT DURATION: 12 weeks  PLANNED INTERVENTIONS: Therapeutic exercises, Therapeutic activity, Patient/Family education, Self Care, Joint mobilization,  Dry Needling, Spinal manipulation, Spinal mobilization, Cryotherapy, Moist heat, scar mobilization, Taping, Ultrasound, and Manual therapy  PLAN FOR NEXT SESSION: Manual Therapy for ROM and advance HEP as appropriate.     Gwenlyn Saran, PT, DPT 01/19/22, 3:38 PM Physical Therapist- [facility]
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
## 2022-01-21 ENCOUNTER — Encounter: Payer: Self-pay | Admitting: Psychiatry

## 2022-01-21 ENCOUNTER — Ambulatory Visit (INDEPENDENT_AMBULATORY_CARE_PROVIDER_SITE_OTHER): Payer: Medicare Other | Admitting: Psychiatry

## 2022-01-21 ENCOUNTER — Ambulatory Visit: Payer: Medicare Other

## 2022-01-21 VITALS — BP 127/75 | HR 72 | Wt 189.8 lb

## 2022-01-21 DIAGNOSIS — M25511 Pain in right shoulder: Secondary | ICD-10-CM

## 2022-01-21 DIAGNOSIS — F259 Schizoaffective disorder, unspecified: Secondary | ICD-10-CM

## 2022-01-21 DIAGNOSIS — M25619 Stiffness of unspecified shoulder, not elsewhere classified: Secondary | ICD-10-CM | POA: Diagnosis not present

## 2022-01-21 DIAGNOSIS — Z96611 Presence of right artificial shoulder joint: Secondary | ICD-10-CM | POA: Diagnosis not present

## 2022-01-21 DIAGNOSIS — R4189 Other symptoms and signs involving cognitive functions and awareness: Secondary | ICD-10-CM

## 2022-01-21 DIAGNOSIS — F411 Generalized anxiety disorder: Secondary | ICD-10-CM

## 2022-01-21 DIAGNOSIS — F4310 Post-traumatic stress disorder, unspecified: Secondary | ICD-10-CM

## 2022-01-21 DIAGNOSIS — R262 Difficulty in walking, not elsewhere classified: Secondary | ICD-10-CM

## 2022-01-21 DIAGNOSIS — M6281 Muscle weakness (generalized): Secondary | ICD-10-CM | POA: Diagnosis not present

## 2022-01-21 MED ORDER — CLONAZEPAM 1 MG PO TABS
ORAL_TABLET | ORAL | 0 refills | Status: DC
Start: 2022-02-09 — End: 2022-03-07

## 2022-01-21 MED ORDER — BUPROPION HCL ER (SR) 200 MG PO TB12: 200.0000 mg | ORAL_TABLET | Freq: Two times a day (BID) | ORAL | 5 refills | Status: DC

## 2022-01-21 MED ORDER — LURASIDONE HCL 120 MG PO TABS: 120.0000 mg | ORAL_TABLET | Freq: Every day | ORAL | 5 refills | Status: DC

## 2022-01-21 MED ORDER — ZOLPIDEM TARTRATE 5 MG PO TABS: 5.0000 mg | ORAL_TABLET | Freq: Every evening | ORAL | 0 refills | Status: DC | PRN

## 2022-01-21 NOTE — Therapy (Signed)
OUTPATIENT PHYSICAL THERAPY SHOULDER TREATMENT   Patient Name: Angela Fernandez MRN: [id number] DOB:1946-04-23, 76 y.o., female Today's Date: 01/21/2022   PT End of Session - 01/21/22 1106     Visit Number 11    Number of Visits 25    Date for PT Re-Evaluation 02/26/22    Authorization Time Period Initial PT cert 12/25/5629- 49/70/2637    Progress Note Due on Visit 10    PT Start Time 1101    PT Stop Time 1145    PT Time Calculation (min) 44 min    Activity Tolerance Patient tolerated treatment well    Behavior During Therapy WFL for tasks assessed/performed              Past Medical History:  Diagnosis Date   Anemia    Arthritis    Bipolar affect, depressed ([place])    COPD (chronic obstructive pulmonary disease) ([place])    Diabetes mellitus without complication ([place])    type II   GERD (gastroesophageal reflux disease)    Hyperlipidemia    Hypertension    Hypothyroidism    PTSD (post-traumatic stress disorder)    Schizoaffective disorder ([place])    Sleep apnea    cpap   Stroke ([place])    hx of  mini stroke - 2001   TIA (transient ischemic attack) 2010   UTI (urinary tract infection)    Past Surgical History:  Procedure Laterality Date   BILATERAL CARPAL TUNNEL RELEASE Bilateral    BREAST BIOPSY     BREAST SURGERY Right    lumpectomy   CATARACT EXTRACTION W/ INTRAOCULAR LENS  IMPLANT, BILATERAL Bilateral    CHOLECYSTECTOMY     COLONOSCOPY     ESOPHAGOGASTRODUODENOSCOPY     EYE SURGERY     JOINT REPLACEMENT     NASAL SINUS SURGERY     REPLACEMENT TOTAL KNEE BILATERAL Bilateral    REVERSE SHOULDER ARTHROPLASTY Right 09/02/2020   Procedure: REVERSE SHOULDER ARTHROPLASTY;  Surgeon: Corky Mull, MD;  Location: [facility] ORS;  Service: Orthopedics;  Laterality: Right;   SHOULDER SURGERY Right 2020   rotator cuff repair   TOTAL SHOULDER REVISION Right 10/23/2021   Procedure: REVERSE SHOULDER REVISION;  Surgeon: Nicholes Stairs, MD;  Location: [facility];  Service:  Orthopedics;  Laterality: Right;  180   Patient Active Problem List   Diagnosis Date Noted   Hx of sleep apnea 12/21/2021   S/P reverse total shoulder arthroplasty, right 10/23/2021   Cystocele with rectocele 04/10/2021   Mixed urinary incontinence due to female genital prolapse 04/10/2021   Submucous leiomyoma of uterus 04/10/2021   Thickened endometrium 04/10/2021   Pelvic pressure in female 02/11/2021   DJD (degenerative joint disease) of cervical spine 12/28/2020   Anxiety, generalized 12/28/2020   History of depression 12/28/2020   Memory difficulty 12/28/2020   Chronic venous insufficiency 12/24/2020   Lymphedema 12/24/2020   Diabetes ([place]) 12/24/2020   Hyperlipidemia 12/24/2020   Essential hypertension 12/24/2020   Closed fracture of neck of right scapula 10/17/2020   Status post reverse total shoulder replacement, right 09/02/2020   Rotator cuff arthropathy, right 07/14/2020    PCP: Adrian Prows  REFERRING PROVIDER: Dr. Victorino December ([facility])  REFERRING DIAG: s/p Right Reverse Total shoulder arthroplasty (REVISION) 10/23/2021  THERAPY DIAG:  Difficulty in walking, not elsewhere classified  Muscle weakness (generalized)  Right shoulder pain, unspecified chronicity  Limited range of motion (ROM) of shoulder  Rationale for Evaluation and Treatment Rehabilitation  ONSET DATE: 10/23/2021  SUBJECTIVE:  Pt reports she felt much better after eating snack here last treatment session, due to complained dizziness and weakness.  Pt states she had no pain upon arrival to the clinic, but the automatic doors at the hospital closed on her R shoulder and caused her to have 2/10 pain.  PERTINENT HISTORY: History taken per MD note on 6/16 Victorino December, MD)  Angela Fernandez is a 76 y.o. female who complains of right shoulder pain and dysfunction following a elective reverse arthroplasty performed over in [place] a number of months ago.  She had early failure of the  baseplate with loosening and now malposition  PAIN:  Are you having pain? Yes: NPRS scale: 2/10 Pain location: superior and ant shoulder Pain description: ache mostly; but sharp with movement Aggravating factors: active movement; reaching Relieving factors: Rest; Ice; tramadol  PRECAUTIONS: Shoulder- in demographics section   PATIENT GOALS  I want to be able to reach the top of head and also paint as well  OBJECTIVE:   TODAY'S TREATMENT: 01/14/22   THEREX:  R shoulder  Supine Scaption AROM x 2 sets of 12 reps (shaky but able to complete with more ROM)  Supine Shoulder Flexion AROM 2x10  Sidelye Shoulder flex AROM  2x12 reps  Supine shoulder ER (arm positioned at 45 deg abd and elbow on towel roll) - YTB 2 sets of 12 reps  Supine serratus punch AROM 2x15 reps - VC/TC for technique (min assist to hold at elbow to maintain straight elbow)   Standing modified chest press, 10# with B UE support at Matrix machine, 2x12  Standing bicep curl 10# with B UE support at Matrix machine, 2x12  Standing lat pull down, 25# with B UE support at QUALCOMM machine, 2x12   PATIENT EDUCATION: Education details: Exercise technique; progression of HEP Person educated: Patient Education method: Explanation, Demonstration, Tactile cues, and Verbal cues Education comprehension: verbalized understanding, returned demonstration, verbal cues required, tactile cues required, and needs further education   HOME EXERCISE PROGRAM: Access Code: [email or web address] URL: [email or web address] Date: 12/24/2021 Prepared by: Sande Brothers  Exercises - Supine Shoulder Flexion Extension AAROM with Dowel  - 3 x weekly - 3 sets - 10 reps - Supine Shoulder External Rotation in 45 Degrees Abduction AAROM with Dowel  - 3 x weekly - 3 sets - 10 reps - Supine Shoulder Scaption with Dowel  - 3 x weekly - 3 sets - 10 reps - Scapular Retraction with Resistance  - 1 x daily - 3 x weekly - 3 sets - 10 reps -  Shoulder External Rotation and Scapular Retraction with Resistance  - 1 x daily - 3 x weekly - 3 sets - 10 reps - Standing Serratus Punch with Resistance  - 1 x daily - 3 x weekly - 3 sets - 10 reps - Standing Single Arm Elbow Flexion with Resistance  - 1 x daily - 3 x weekly - 3 sets - 10 reps - Standing Shoulder Shrugs  - 1 x daily - 3 x weekly - 3 sets - 10 reps     Access Code: [email or web address] URL: [email or web address] Date: 12/16/2021 Prepared by: Sande Brothers  Exercises - Supine Shoulder Flexion AAROM with Hands Clasped  - 1 x daily - 3 sets - 10 reps - Supine Shoulder Flexion Extension Full Range AROM  - 1 x daily - 3 sets - 10 reps - Supine Shoulder Internal Rotation  - 1 x daily - 3 sets - 10 reps - Supine Shoulder External Rotation Stretch  -  1 x daily - 3 sets - 10 reps - Supine Scapular Retraction  - 1 x daily - 3 sets - 10 reps  Reviewed active elbow flex/ext/forearm sup/pronation/wrist ext/flex    ASSESSMENT:  CLINICAL IMPRESSION:  Pt is making significant progress towards goals as assessed and noted below.  Pt able to achieve most goals and new goal established due to pt still reporting pain and weakness with pushing tasks at home.  Pt also performed exercises built to target specific pushing methods.  Pt does require consistent verbal cuing for scapular depression in order to alleviate pain with activities.  Pt has tendency to allow for UT to compensate and that causes significant pain in the UE.   Pt will continue to benefit from skilled therapy to address remaining deficits in order to improve overall QoL and return to PLOF.    OBJECTIVE IMPAIRMENTS decreased activity tolerance, decreased mobility, decreased ROM, decreased strength, decreased safety awareness, hypomobility, impaired flexibility, impaired UE functional use, postural dysfunction, and pain.   ACTIVITY LIMITATIONS carrying, lifting, sleeping, bathing, dressing, reach over head, and  hygiene/grooming  PARTICIPATION LIMITATIONS: meal prep, cleaning, laundry, driving, shopping, community activity, and yard work  PERSONAL FACTORS Behavior pattern and 1-2 comorbidities: COPD, Mult shoulder surgeries  are also affecting patient's functional outcome.   REHAB POTENTIAL: Good  CLINICAL DECISION MAKING: Evolving/moderate complexity  EVALUATION COMPLEXITY: Low   GOALS: Goals reviewed with patient? Yes  SHORT TERM GOALS: Target date: 02/02/2022  (Remove Blue Hyperlink)  Pt will be independent with initial HEP in order to improve strength and decrease pain in order to improve pain-free function at home and work. Baseline: 12/04/2021- No formal HEP in place Goal status: INITIAL   LONG TERM GOALS: Target date: 03/16/2022  (Remove Blue Hyperlink)  Pt will be independent with Final HEP in order to improve strength and decrease pain in order to improve pain-free function at home and work. Baseline: 12/04/2021- No formal HEP in place Goal status: INITIAL  2.  Pt will decrease quick DASH score by at least 8% in order to demonstrate clinically significant reduction in disability. Baseline: 12/04/2021= 61; 9/14: 47.73 % Goal status: Goal Met  3.  Pt will improve FOTO to target score of 51% to display perceived improvements in ability to complete ADL's.  Baseline: 12/04/2021=40; 9/14: 54%  Goal status: Goal Met  4.  Pt will decrease worst pain as reported on NPRS by at least 3 points in order to demonstrate clinically significant reduction in pain. Baseline: 12/04/2021= 6/10 Right shoulder pain; 9/14 - 4/10 worst pain over past week Goal status: On-Going  5.  Patient will demo > 140 deg of right shoulder elevation (PROM) for improved ROM and overhead activities Baseline: 12/04/2021=PROM R shoulder flex- 118; 105 ABD; 9/14 - R Shoulder  Goal status: INITIAL  6.  Patient will demo > 100 deg of right shoulder elevation (AROM) for improved ROM and overhead activities Baseline:  12/04/2021=PROM R shoulder flex- 118; 105 ABD; 9/14 - R Shoulder Flexion: 142 deg; Abduction: 121 deg. Goal status: Goal Met  7.  Patient will be able to mop 2 rooms at home without increase in pain to demonstrate increased strength without pain.   Baseline: 01/21/2022 - Pt states she is only able to mop 1 room without pain and any more it jumps up to 4/10. Goal status: New   PLAN: PT FREQUENCY: 2x/week  PT DURATION: 12 weeks  PLANNED INTERVENTIONS: Therapeutic exercises, Therapeutic activity, Patient/Family education, Self Care, Joint mobilization, Dry  Needling, Spinal manipulation, Spinal mobilization, Cryotherapy, Moist heat, scar mobilization, Taping, Ultrasound, and Manual therapy  PLAN FOR NEXT SESSION: Manual Therapy for ROM and advance HEP as appropriate.    Gwenlyn Saran, PT, DPT 01/21/22, 1:36 PM Physical Therapist- [facility]

## 2022-01-21 NOTE — Therapy (Signed)
OUTPATIENT OCCUPATIONAL THERAPY PROGRESS AND TREATMENT Reporting period beginning 11/12/21-01/21/22  Patient Name: Angela Fernandez MRN: [id number] DOB:09-19-1945, 76 y.o., female Today's Date: 01/21/2022  PCP: Dr. Leonel Ramsay REFERRING PROVIDER: Dr. Norman Clay   OT End of Session - 01/21/22 1310     Visit Number 10    Number of Visits 12    Date for OT Re-Evaluation 02/04/22    Authorization Time Period Reporting period beginning 11/12/21-01/21/22    OT Start Time 1145    OT Stop Time 37    OT Time Calculation (min) 45 min    Activity Tolerance Patient tolerated treatment well    Behavior During Therapy WFL for tasks assessed/performed               Past Medical History:  Diagnosis Date   Anemia    Arthritis    Bipolar affect, depressed ([place])    COPD (chronic obstructive pulmonary disease) ([place])    Diabetes mellitus without complication ([place])    type II   GERD (gastroesophageal reflux disease)    Hyperlipidemia    Hypertension    Hypothyroidism    PTSD (post-traumatic stress disorder)    Schizoaffective disorder ([place])    Sleep apnea    cpap   Stroke ([place])    hx of  mini stroke - 2001   TIA (transient ischemic attack) 2010   UTI (urinary tract infection)    Past Surgical History:  Procedure Laterality Date   BILATERAL CARPAL TUNNEL RELEASE Bilateral    BREAST BIOPSY     BREAST SURGERY Right    lumpectomy   CATARACT EXTRACTION W/ INTRAOCULAR LENS  IMPLANT, BILATERAL Bilateral    CHOLECYSTECTOMY     COLONOSCOPY     ESOPHAGOGASTRODUODENOSCOPY     EYE SURGERY     JOINT REPLACEMENT     NASAL SINUS SURGERY     REPLACEMENT TOTAL KNEE BILATERAL Bilateral    REVERSE SHOULDER ARTHROPLASTY Right 09/02/2020   Procedure: REVERSE SHOULDER ARTHROPLASTY;  Surgeon: Corky Mull, MD;  Location: [facility] ORS;  Service: Orthopedics;  Laterality: Right;   SHOULDER SURGERY Right 2020   rotator cuff repair   TOTAL SHOULDER REVISION Right 10/23/2021   Procedure: REVERSE  SHOULDER REVISION;  Surgeon: Nicholes Stairs, MD;  Location: [facility];  Service: Orthopedics;  Laterality: Right;  180   Patient Active Problem List   Diagnosis Date Noted   Hx of sleep apnea 12/21/2021   S/P reverse total shoulder arthroplasty, right 10/23/2021   Cystocele with rectocele 04/10/2021   Mixed urinary incontinence due to female genital prolapse 04/10/2021   Submucous leiomyoma of uterus 04/10/2021   Thickened endometrium 04/10/2021   Pelvic pressure in female 02/11/2021   DJD (degenerative joint disease) of cervical spine 12/28/2020   Anxiety, generalized 12/28/2020   History of depression 12/28/2020   Memory difficulty 12/28/2020   Chronic venous insufficiency 12/24/2020   Lymphedema 12/24/2020   Diabetes ([place]) 12/24/2020   Hyperlipidemia 12/24/2020   Essential hypertension 12/24/2020   Closed fracture of neck of right scapula 10/17/2020   Status post reverse total shoulder replacement, right 09/02/2020   Rotator cuff arthropathy, right 07/14/2020    ONSET DATE: 10/13/21 (referral date)  REFERRING DIAG: Cognitive decline, schizoaffective disorder, unspecified type  THERAPY DIAG:  Schizoaffective disorder, unspecified type ([place])  Cognitive decline  Rationale for Evaluation and Treatment Rehabilitation  SUBJECTIVE: Pt reports disappointment that her car is unable to be fixed.  Pt reports frustration that she will not be able to  afford another car as she told her granddaughter she would pay for a new fence at their new house for her granddaughter's dog. Pt accompanied by: self  PERTINENT HISTORY: Per chart, Schizoaffective disorder, PTSD, anxiety, depression, insomnia, sleep apnea, occasional passive SI, occasional AH (voices); Recent R reverse TSA on 10/23/21.  Per pt, she moved from [place], approximately 1 year and 4-5 months ago to move in with her daughter and granddaughter.  Pt reports she is actively looking for a house to move out, with plans to have  granddaughter live with her.  Pt reports being eager to be more indep in her living situation.  PAIN:  Are you having pain? Pt reports 5/10 pain in bilat feet, back, L shoulder, and neck.  Rest helps to ease pain.   PATIENT GOALS : to move out and live more independently, possibly with granddaughter living with her.    OBJECTIVE:  Self Care:  Reviewed and updated goals for progress note and plan for remaining 2 visits.  Initial focus on goal #1 to create and use visual reminder for identifying emergency numbers and frequent contacts.  Pt had already saved her close family member's contacts in her phone and demos ability to access these numbers.  Pt was unsure of "911" when asked about an emergency number.  OT had pt write "911 for emergency" in bold print at the front of pt's planner, which she has with her in and out of the house.  Also placed new label to back of phone, reading "911" just underneath her personal number so that this emergency number is accessible to her at all times, as pt also keeps her cell phone nearby at all times.    Made recommendation for reacher as pt reports that she's finding that as she unpacks at her new home, certain things are out of reach in the kitchen cupboards and family does not want to have pt use a step stool.  Educated on options to obtain a Secondary school teacher and also encouraged pt to communicate to family if there are items which pt uses frequently that should be moved to a lower shelf to increase accessibility to pt.    Pt continues to work towards increasing access to the community.  Assisted pt to utilize cell phone to identify nearest [facility] to her new home; max A as pt forgot her glasses today.  Pt reports she's hoping to join the local [employer] so she can begin swimming again and taking yoga classes.  OT assisted pt to access the [facility] phone number and address and pt wrote this in her planner so that she can call and ask to be mailed a flyer.  Pt plans to use community  transportation or rides from family.  Pt states that her medicaid transportation requires 10 days notice to book a ride, so encouraged pt to research the [employer] hours and classes to plan ahead for Medicaid transport.  Pt receptive and took notes with fewer cues to increase carry over with OT recommendations for increasing indep and access to community.    PATIENT EDUCATION: Education details: community access  Person educated: Patient Education method: Explanation  Education comprehension: verbalized understanding, mod A  GOALS: Goals reviewed with patient? Yes  SHORT TERM GOALS: Target date: 12/24/21    Pt will utilize visual reminders to independently identify emergency numbers and frequent contacts (ie family members, MD offices) to call when in need of assistance. Baseline: Pt was unsure whether "911" was the correct number  to call for a house fire and reported being unable to search for information on her phone to identify people/places; 01/21/22: new "911" label taped to back of pt's cell phone, and written in bold letters at front of her planner.  Pt has family member's contact info in her cell phone and knows how to access these numbers. Goal status: achieved  2.  Pt will be indep to address an envelope for mailing a bill or letter. Baseline: Pt forgetful to her zip code and left off the PO box number on a simulated utility bill; 01/21/22: indep; pt indep to recall her new address but does have it written in her planner as well, and demos consistent and accurate ability to fill out an envelope to mail a mock bill.  Goal status: indep  3. Pt will complete Pill Box Assessment Test to determine level of assist needed for medication management.  Baseline: Not yet completed; 01/21/22: Pill box assessment completed, but no longer applicable as daughter will continue to manage pt's meds.  Goal status: achieved  4. Pt will develop organizational system (Ie: calendar/chart) to identify when monthly  bills are due for timely bill paying.  Baseline: Not yet initiated; 01/21/22: pt is consisently using a paper planner to keep herself organized, but daughter will continue to pay her bills.  Goal status: discharge/no longer applicable   LONG TERM GOALS: Target date: 02/04/22    Pt will write checks for monthly bills with min supv for accuracy and completion.  Baseline: Daughter currently manages bills; pt reports daughter will continue to manage her monthly bills, though pt demos indep with filling out checks with mock bills. Goal status: No longer applicable  2.  Pt will manage medications with mod A. (May revise supervision level following completion of Pill Box assessment) Baseline: Daughter currently manages medications; 01/21/22: daughter will continue to manage meds (pt prefers this) Goal status: no longer applicable  3.  Pt will be indep to utilize smart phone to look up desired information for increasing community engagement and socialization (locations and phone numbers for local restaurants, movie theaters, parks) Baseline: Pt reports she does not know how to look up information on her smart phone (Brief instruction given at eval but additional training needed); pt is using cell phone with mod vc to look up locations/phone numbers/desired information Goal status: ongoing  4.  Pt will demo indep with use of map quest/google maps or a like app to increase indep with community mobility. Baseline: Pt can drive to a few familiar locations (granddaughter's place of work, MD offices), but otherwise does not know how to utilize phone to access new locations; 01/21/22: pt will no longer be driving herself within community as her car is unable to be fixed and she doesn't have the finances at this time to replace it.  Will continue to review how to search for desired addresses on smart phone to notify Medicaid transportation of desired destinations. Goal status: ongoing  ASSESSMENT:  CLINICAL  IMPRESSION: Pt has been settling in to her new home with her granddaughter and granddaughter's boyfriend.  Daughter will continue to manage all of pt's medications and bills, and pt is planning to rely on family and Medicaid transportation for her transportation needs as pt's car is unable to be fixed.  Pt is using cell phone and planner more consistently to stay organized with accessing contacts, addresses, and appointments.  Pt continues to require mod vc to look up new information on her smart phone,  but has taken notes on steps for searching desired information, and has been encouraged to continue to practice with her phone at home to increase indep with accessing information in the community.  Remaining 2 visits will continue to focus on maximizing safety and indep at home as she adjusts to a new home environment.  PERFORMANCE DEFICITS in functional skills including IADLs and pain, cognitive skills including memory and information processing , and psychosocial skills including environmental adaptation and interpersonal interactions.   IMPAIRMENTS are limiting patient from IADLs, leisure, and social participation.   COMORBIDITIES may have co-morbidities  that affects occupational performance. Patient will benefit from skilled OT to address above impairments and improve overall function.  MODIFICATION OR ASSISTANCE TO COMPLETE EVALUATION: Min-Moderate modification of tasks or assist with assess necessary to complete an evaluation.  OT OCCUPATIONAL PROFILE AND HISTORY: Detailed assessment: Review of records and additional review of physical, cognitive, psychosocial history related to current functional performance.  CLINICAL DECISION MAKING: Moderate - several treatment options, min-mod task modification necessary  REHAB POTENTIAL: Good  EVALUATION COMPLEXITY: High   PLAN: OT FREQUENCY: 1x/week  OT DURATION: 12 weeks  PLANNED INTERVENTIONS: self care/ADL training, therapeutic activity,  cognitive remediation/compensation, and psychosocial skills training  RECOMMENDED OTHER SERVICES: PT for shoulder rehab (referral requested and faxed to surgeon)  CONSULTED AND AGREED WITH PLAN OF CARE: Patient  PLAN FOR NEXT SESSION: FOTO completion, IADL training (see goals)   Leta Speller, MS, OTR/L   Darleene Cleaver, OT 01/21/2022, 1:12 PM

## 2022-01-21 NOTE — Patient Instructions (Signed)
Continue Pristiq 50 mg every day Continue bupropion 200 mg twice a day Continue Latuda 120 mg at night  Continue clonazepam 1 mg three time a day  Continue Ambien 5 mg at night as needed for insomnia Next appointment: 11/13 at 8:30

## 2022-01-22 DIAGNOSIS — Z96611 Presence of right artificial shoulder joint: Secondary | ICD-10-CM | POA: Diagnosis not present

## 2022-01-26 ENCOUNTER — Ambulatory Visit: Payer: Medicare Other | Admitting: Physical Therapy

## 2022-01-28 ENCOUNTER — Ambulatory Visit: Payer: Medicare Other

## 2022-01-28 DIAGNOSIS — R262 Difficulty in walking, not elsewhere classified: Secondary | ICD-10-CM | POA: Diagnosis not present

## 2022-01-28 DIAGNOSIS — M25511 Pain in right shoulder: Secondary | ICD-10-CM | POA: Diagnosis not present

## 2022-01-28 DIAGNOSIS — Z96611 Presence of right artificial shoulder joint: Secondary | ICD-10-CM | POA: Diagnosis not present

## 2022-01-28 DIAGNOSIS — M25619 Stiffness of unspecified shoulder, not elsewhere classified: Secondary | ICD-10-CM

## 2022-01-28 DIAGNOSIS — M6281 Muscle weakness (generalized): Secondary | ICD-10-CM

## 2022-01-28 DIAGNOSIS — F259 Schizoaffective disorder, unspecified: Secondary | ICD-10-CM

## 2022-01-28 DIAGNOSIS — R4189 Other symptoms and signs involving cognitive functions and awareness: Secondary | ICD-10-CM

## 2022-01-28 NOTE — Therapy (Signed)
OUTPATIENT PHYSICAL THERAPY SHOULDER TREATMENT   Patient Name: Angela Fernandez MRN: [id number] DOB:08/03/1945, 76 y.o., female Today's Date: 02/01/2022   PT End of Session - 02/01/22 1339     Visit Number 12    Number of Visits 25    Date for PT Re-Evaluation 02/26/22    Authorization Time Period Initial PT cert 2/45/8099- 83/38/2505    Progress Note Due on Visit 20    PT Start Time 1600    PT Stop Time 3976    PT Time Calculation (min) 41 min    Activity Tolerance Patient tolerated treatment well    Behavior During Therapy WFL for tasks assessed/performed              Past Medical History:  Diagnosis Date   Anemia    Arthritis    Bipolar affect, depressed ([place])    COPD (chronic obstructive pulmonary disease) ([place])    Diabetes mellitus without complication (HCC)    type II   GERD (gastroesophageal reflux disease)    Hyperlipidemia    Hypertension    Hypothyroidism    PTSD (post-traumatic stress disorder)    Schizoaffective disorder ([place])    Sleep apnea    cpap   Stroke ([place])    hx of  mini stroke - 2001   TIA (transient ischemic attack) 2010   UTI (urinary tract infection)    Past Surgical History:  Procedure Laterality Date   BILATERAL CARPAL TUNNEL RELEASE Bilateral    BREAST BIOPSY     BREAST SURGERY Right    lumpectomy   CATARACT EXTRACTION W/ INTRAOCULAR LENS  IMPLANT, BILATERAL Bilateral    CHOLECYSTECTOMY     COLONOSCOPY     ESOPHAGOGASTRODUODENOSCOPY     EYE SURGERY     JOINT REPLACEMENT     NASAL SINUS SURGERY     REPLACEMENT TOTAL KNEE BILATERAL Bilateral    REVERSE SHOULDER ARTHROPLASTY Right 09/02/2020   Procedure: REVERSE SHOULDER ARTHROPLASTY;  Surgeon: Corky Mull, MD;  Location: [facility] ORS;  Service: Orthopedics;  Laterality: Right;   SHOULDER SURGERY Right 2020   rotator cuff repair   TOTAL SHOULDER REVISION Right 10/23/2021   Procedure: REVERSE SHOULDER REVISION;  Surgeon: Nicholes Stairs, MD;  Location: [facility];  Service:  Orthopedics;  Laterality: Right;  180   Patient Active Problem List   Diagnosis Date Noted   Hx of sleep apnea 12/21/2021   S/P reverse total shoulder arthroplasty, right 10/23/2021   Cystocele with rectocele 04/10/2021   Mixed urinary incontinence due to female genital prolapse 04/10/2021   Submucous leiomyoma of uterus 04/10/2021   Thickened endometrium 04/10/2021   Pelvic pressure in female 02/11/2021   DJD (degenerative joint disease) of cervical spine 12/28/2020   Anxiety, generalized 12/28/2020   History of depression 12/28/2020   Memory difficulty 12/28/2020   Chronic venous insufficiency 12/24/2020   Lymphedema 12/24/2020   Diabetes ([place]) 12/24/2020   Hyperlipidemia 12/24/2020   Essential hypertension 12/24/2020   Closed fracture of neck of right scapula 10/17/2020   Status post reverse total shoulder replacement, right 09/02/2020   Rotator cuff arthropathy, right 07/14/2020    PCP: Adrian Prows  REFERRING PROVIDER: Dr. Victorino December ([facility])  REFERRING DIAG: s/p Right Reverse Total shoulder arthroplasty (REVISION) 10/23/2021  THERAPY DIAG:  Difficulty in walking, not elsewhere classified  Muscle weakness (generalized)  Right shoulder pain, unspecified chronicity  Limited range of motion (ROM) of shoulder  S/P reverse total shoulder arthroplasty, right  Rationale for Evaluation and Treatment  Rehabilitation  ONSET DATE: 10/23/2021  SUBJECTIVE:  Pt reports she feeling better overall and states doing well this week.  PERTINENT HISTORY: History taken per MD note on 6/16 Victorino December, MD)  Angela Fernandez is a 76 y.o. female who complains of right shoulder pain and dysfunction following a elective reverse arthroplasty performed over in [place] a number of months ago.  She had early failure of the baseplate with loosening and now malposition  PAIN:  Are you having pain? Yes: NPRS scale: 2/10 Pain location: superior and ant shoulder Pain description:  ache mostly; but sharp with movement Aggravating factors: active movement; reaching Relieving factors: Rest; Ice; tramadol  PRECAUTIONS: Shoulder- in demographics section   PATIENT GOALS  I want to be able to reach the top of head and also paint as well  OBJECTIVE:   TODAY'S TREATMENT: 01/14/22   THEREX:  R shoulder  Supine Scaption AROM x 2 sets of 12 reps (shaky but able to complete with more ROM)  Supine Shoulder Flexion AROM 2x10  Sidelye Shoulder flex AROM  2x12 reps  Supine shoulder ER (arm positioned at 45 deg abd and elbow on towel roll) - YTB 2 sets of 12 reps  Standing modified chest press, 2.5 # with R UE support at Matrix machine, 2x12  Standing bicep curl 10# with B UE support at Matrix machine, 2x12  Standing lat pull down, Single arm 2.5 # at Matrix machine x 12 (very difficult - VC for correct form)    PATIENT EDUCATION: Education details: Exercise technique; progression of HEP Person educated: Patient Education method: Explanation, Demonstration, Tactile cues, and Verbal cues Education comprehension: verbalized understanding, returned demonstration, verbal cues required, tactile cues required, and needs further education   HOME EXERCISE PROGRAM: Access Code: [email or web address] URL: [email or web address] Date: 12/24/2021 Prepared by: Sande Brothers  Exercises - Supine Shoulder Flexion Extension AAROM with Dowel  - 3 x weekly - 3 sets - 10 reps - Supine Shoulder External Rotation in 45 Degrees Abduction AAROM with Dowel  - 3 x weekly - 3 sets - 10 reps - Supine Shoulder Scaption with Dowel  - 3 x weekly - 3 sets - 10 reps - Scapular Retraction with Resistance  - 1 x daily - 3 x weekly - 3 sets - 10 reps - Shoulder External Rotation and Scapular Retraction with Resistance  - 1 x daily - 3 x weekly - 3 sets - 10 reps - Standing Serratus Punch with Resistance  - 1 x daily - 3 x weekly - 3 sets - 10 reps - Standing Single Arm Elbow Flexion with  Resistance  - 1 x daily - 3 x weekly - 3 sets - 10 reps - Standing Shoulder Shrugs  - 1 x daily - 3 x weekly - 3 sets - 10 reps     Access Code: [email or web address] URL: [email or web address] Date: 12/16/2021 Prepared by: Sande Brothers  Exercises - Supine Shoulder Flexion AAROM with Hands Clasped  - 1 x daily - 3 sets - 10 reps - Supine Shoulder Flexion Extension Full Range AROM  - 1 x daily - 3 sets - 10 reps - Supine Shoulder Internal Rotation  - 1 x daily - 3 sets - 10 reps - Supine Shoulder External Rotation Stretch  - 1 x daily - 3 sets - 10 reps - Supine Scapular Retraction  - 1 x daily - 3 sets - 10 reps  Reviewed active elbow flex/ext/forearm sup/pronation/wrist ext/flex    ASSESSMENT:  CLINICAL IMPRESSION:  Pt presents  with excellent motivation for today's session. She continues to focus on her shoulder protocol and strengthening. Overall she is doing well- still very limited with right shoulder external rotation strength.  Pt continues to be dependent on verbal cuing for scapular depression in order to perform shoulder strengthening exercises with correct technique. Pt will continue to benefit from skilled therapy to address remaining deficits in order to improve overall QoL and return to PLOF.    OBJECTIVE IMPAIRMENTS decreased activity tolerance, decreased mobility, decreased ROM, decreased strength, decreased safety awareness, hypomobility, impaired flexibility, impaired UE functional use, postural dysfunction, and pain.   ACTIVITY LIMITATIONS carrying, lifting, sleeping, bathing, dressing, reach over head, and hygiene/grooming  PARTICIPATION LIMITATIONS: meal prep, cleaning, laundry, driving, shopping, community activity, and yard work  PERSONAL FACTORS Behavior pattern and 1-2 comorbidities: COPD, Mult shoulder surgeries  are also affecting patient's functional outcome.   REHAB POTENTIAL: Good  CLINICAL DECISION MAKING: Evolving/moderate  complexity  EVALUATION COMPLEXITY: Low   GOALS: Goals reviewed with patient? Yes  SHORT TERM GOALS: Target date: 02/02/2022  (Remove Blue Hyperlink)  Pt will be independent with initial HEP in order to improve strength and decrease pain in order to improve pain-free function at home and work. Baseline: 12/04/2021- No formal HEP in place Goal status: INITIAL   LONG TERM GOALS: Target date: 03/16/2022  (Remove Blue Hyperlink)  Pt will be independent with Final HEP in order to improve strength and decrease pain in order to improve pain-free function at home and work. Baseline: 12/04/2021- No formal HEP in place Goal status: INITIAL  2.  Pt will decrease quick DASH score by at least 8% in order to demonstrate clinically significant reduction in disability. Baseline: 12/04/2021= 61; 9/14: 47.73 % Goal status: Goal Met  3.  Pt will improve FOTO to target score of 51% to display perceived improvements in ability to complete ADL's.  Baseline: 12/04/2021=40; 9/14: 54%  Goal status: Goal Met  4.  Pt will decrease worst pain as reported on NPRS by at least 3 points in order to demonstrate clinically significant reduction in pain. Baseline: 12/04/2021= 6/10 Right shoulder pain; 9/14 - 4/10 worst pain over past week Goal status: On-Going  5.  Patient will demo > 140 deg of right shoulder elevation (PROM) for improved ROM and overhead activities Baseline: 12/04/2021=PROM R shoulder flex- 118; 105 ABD; 9/14 - R Shoulder  Goal status: INITIAL  6.  Patient will demo > 100 deg of right shoulder elevation (AROM) for improved ROM and overhead activities Baseline: 12/04/2021=PROM R shoulder flex- 118; 105 ABD; 9/14 - R Shoulder Flexion: 142 deg; Abduction: 121 deg. Goal status: Goal Met  7.  Patient will be able to mop 2 rooms at home without increase in pain to demonstrate increased strength without pain.   Baseline: 01/21/2022 - Pt states she is only able to mop 1 room without pain and any more it jumps  up to 4/10. Goal status: New   PLAN: PT FREQUENCY: 2x/week  PT DURATION: 12 weeks  PLANNED INTERVENTIONS: Therapeutic exercises, Therapeutic activity, Patient/Family education, Self Care, Joint mobilization, Dry Needling, Spinal manipulation, Spinal mobilization, Cryotherapy, Moist heat, scar mobilization, Taping, Ultrasound, and Manual therapy  PLAN FOR NEXT SESSION: Manual Therapy for ROM and advance HEP as appropriate.    Ollen Bowl, PT 02/01/22, 1:47 PM Physical Therapist- [facility]

## 2022-01-29 DIAGNOSIS — M898X9 Other specified disorders of bone, unspecified site: Secondary | ICD-10-CM | POA: Diagnosis not present

## 2022-01-29 DIAGNOSIS — E119 Type 2 diabetes mellitus without complications: Secondary | ICD-10-CM | POA: Diagnosis not present

## 2022-01-29 DIAGNOSIS — M79672 Pain in left foot: Secondary | ICD-10-CM | POA: Diagnosis not present

## 2022-01-29 DIAGNOSIS — M79671 Pain in right foot: Secondary | ICD-10-CM | POA: Diagnosis not present

## 2022-01-29 DIAGNOSIS — M79675 Pain in left toe(s): Secondary | ICD-10-CM | POA: Diagnosis not present

## 2022-01-29 DIAGNOSIS — M19072 Primary osteoarthritis, left ankle and foot: Secondary | ICD-10-CM | POA: Diagnosis not present

## 2022-01-29 DIAGNOSIS — M722 Plantar fascial fibromatosis: Secondary | ICD-10-CM | POA: Diagnosis not present

## 2022-01-29 DIAGNOSIS — M79674 Pain in right toe(s): Secondary | ICD-10-CM | POA: Diagnosis not present

## 2022-01-29 DIAGNOSIS — B351 Tinea unguium: Secondary | ICD-10-CM | POA: Diagnosis not present

## 2022-01-29 DIAGNOSIS — M19071 Primary osteoarthritis, right ankle and foot: Secondary | ICD-10-CM | POA: Diagnosis not present

## 2022-01-31 NOTE — Therapy (Signed)
OUTPATIENT OCCUPATIONAL THERAPY TREATMENT NOTE   Patient Name: Angela Fernandez MRN: [id number] DOB:08-14-1945, 76 y.o., female Today's Date: 01/31/2022  PCP: Dr. Leonel Ramsay REFERRING PROVIDER: Dr. Norman Clay   OT End of Session - 01/31/22 1553     Visit Number 11    Number of Visits 12    Date for OT Re-Evaluation 02/04/22    Authorization Time Period Reporting period beginning 11/12/21-01/21/22    OT Start Time 1515    OT Stop Time 1545    OT Time Calculation (min) 30 min    Activity Tolerance Patient tolerated treatment well    Behavior During Therapy WFL for tasks assessed/performed               Past Medical History:  Diagnosis Date   Anemia    Arthritis    Bipolar affect, depressed ([place])    COPD (chronic obstructive pulmonary disease) ([place])    Diabetes mellitus without complication ([place])    type II   GERD (gastroesophageal reflux disease)    Hyperlipidemia    Hypertension    Hypothyroidism    PTSD (post-traumatic stress disorder)    Schizoaffective disorder ([place])    Sleep apnea    cpap   Stroke ([place])    hx of  mini stroke - 2001   TIA (transient ischemic attack) 2010   UTI (urinary tract infection)    Past Surgical History:  Procedure Laterality Date   BILATERAL CARPAL TUNNEL RELEASE Bilateral    BREAST BIOPSY     BREAST SURGERY Right    lumpectomy   CATARACT EXTRACTION W/ INTRAOCULAR LENS  IMPLANT, BILATERAL Bilateral    CHOLECYSTECTOMY     COLONOSCOPY     ESOPHAGOGASTRODUODENOSCOPY     EYE SURGERY     JOINT REPLACEMENT     NASAL SINUS SURGERY     REPLACEMENT TOTAL KNEE BILATERAL Bilateral    REVERSE SHOULDER ARTHROPLASTY Right 09/02/2020   Procedure: REVERSE SHOULDER ARTHROPLASTY;  Surgeon: Corky Mull, MD;  Location: [facility] ORS;  Service: Orthopedics;  Laterality: Right;   SHOULDER SURGERY Right 2020   rotator cuff repair   TOTAL SHOULDER REVISION Right 10/23/2021   Procedure: REVERSE SHOULDER REVISION;  Surgeon: Nicholes Stairs,  MD;  Location: [facility];  Service: Orthopedics;  Laterality: Right;  180   Patient Active Problem List   Diagnosis Date Noted   Hx of sleep apnea 12/21/2021   S/P reverse total shoulder arthroplasty, right 10/23/2021   Cystocele with rectocele 04/10/2021   Mixed urinary incontinence due to female genital prolapse 04/10/2021   Submucous leiomyoma of uterus 04/10/2021   Thickened endometrium 04/10/2021   Pelvic pressure in female 02/11/2021   DJD (degenerative joint disease) of cervical spine 12/28/2020   Anxiety, generalized 12/28/2020   History of depression 12/28/2020   Memory difficulty 12/28/2020   Chronic venous insufficiency 12/24/2020   Lymphedema 12/24/2020   Diabetes ([place]) 12/24/2020   Hyperlipidemia 12/24/2020   Essential hypertension 12/24/2020   Closed fracture of neck of right scapula 10/17/2020   Status post reverse total shoulder replacement, right 09/02/2020   Rotator cuff arthropathy, right 07/14/2020    ONSET DATE: 10/13/21 (referral date)  REFERRING DIAG: Cognitive decline, schizoaffective disorder, unspecified type  THERAPY DIAG:  Schizoaffective disorder, unspecified type ([place])  Cognitive decline  Rationale for Evaluation and Treatment Rehabilitation  SUBJECTIVE: Pt reports she hasn't been feeling well this week due to congestion. Pt accompanied by: self  PERTINENT HISTORY: Per chart, Schizoaffective disorder, PTSD, anxiety, depression,  insomnia, sleep apnea, occasional passive SI, occasional AH (voices); Recent R reverse TSA on 10/23/21.  Per pt, she moved from [place], approximately 1 year and 4-5 months ago to move in with her daughter and granddaughter.  Pt reports she is actively looking for a house to move out, with plans to have granddaughter live with her.  Pt reports being eager to be more indep in her living situation.  PAIN:  Are you having pain? Pt reports 5/10 pain in bilat feet, back, L shoulder, and neck.  Rest helps to ease pain.   PATIENT  GOALS : to move out and live more independently, possibly with granddaughter living with her.    OBJECTIVE:  Self Care:  Pt continues to work towards increasing access to the community.  Pt is on a strict budget but reported wanting to get the newspaper.  Assisted pt to utilize cell phone to download free news app.  Educated pt in navigating through app to access local and nationwide news.  Pt continues to voice her discouragement with not having access to her own transportation.  Encouraged pt to utilize the local news to identify events in the community that she can look forward to, note these dates on her calendar, and call ahead for transportation.  Pt felt encouraged to have access to the news and verbalized that she would practice negotiating through the app at home this week.    PATIENT EDUCATION: Education details: community access  Person educated: Patient Education method: Explanation  Education comprehension: verbalized understanding, mod A  GOALS: Goals reviewed with patient? Yes  SHORT TERM GOALS: Target date: 12/24/21    Pt will utilize visual reminders to independently identify emergency numbers and frequent contacts (ie family members, MD offices) to call when in need of assistance. Baseline: Pt was unsure whether "911" was the correct number to call for a house fire and reported being unable to search for information on her phone to identify people/places; 01/21/22: new "911" label taped to back of pt's cell phone, and written in bold letters at front of her planner.  Pt has family member's contact info in her cell phone and knows how to access these numbers. Goal status: achieved  2.  Pt will be indep to address an envelope for mailing a bill or letter. Baseline: Pt forgetful to her zip code and left off the PO box number on a simulated utility bill; 01/21/22: indep; pt indep to recall her new address but does have it written in her planner as well, and demos consistent and  accurate ability to fill out an envelope to mail a mock bill.  Goal status: indep  3. Pt will complete Pill Box Assessment Test to determine level of assist needed for medication management.  Baseline: Not yet completed; 01/21/22: Pill box assessment completed, but no longer applicable as daughter will continue to manage pt's meds.  Goal status: achieved  4. Pt will develop organizational system (Ie: calendar/chart) to identify when monthly bills are due for timely bill paying.  Baseline: Not yet initiated; 01/21/22: pt is consisently using a paper planner to keep herself organized, but daughter will continue to pay her bills.  Goal status: discharge/no longer applicable   LONG TERM GOALS: Target date: 02/04/22    Pt will write checks for monthly bills with min supv for accuracy and completion.  Baseline: Daughter currently manages bills; pt reports daughter will continue to manage her monthly bills, though pt demos indep with filling out checks  with mock bills. Goal status: No longer applicable  2.  Pt will manage medications with mod A. (May revise supervision level following completion of Pill Box assessment) Baseline: Daughter currently manages medications; 01/21/22: daughter will continue to manage meds (pt prefers this) Goal status: no longer applicable  3.  Pt will be indep to utilize smart phone to look up desired information for increasing community engagement and socialization (locations and phone numbers for local restaurants, movie theaters, parks) Baseline: Pt reports she does not know how to look up information on her smart phone (Brief instruction given at eval but additional training needed); pt is using cell phone with mod vc to look up locations/phone numbers/desired information Goal status: ongoing  4.  Pt will demo indep with use of map quest/google maps or a like app to increase indep with community mobility. Baseline: Pt can drive to a few familiar locations  (granddaughter's place of work, MD offices), but otherwise does not know how to utilize phone to access new locations; 01/21/22: pt will no longer be driving herself within community as her car is unable to be fixed and she doesn't have the finances at this time to replace it.  Will continue to review how to search for desired addresses on smart phone to notify Medicaid transportation of desired destinations. Goal status: ongoing  ASSESSMENT:  CLINICAL IMPRESSION: Pt continues to work towards increasing access to the community.  Pt is on a strict budget but reported wanting to get the newspaper.  Assisted pt to utilize cell phone to download free news app.  Educated pt in navigating through app to access local and nationwide news.  Pt continues to voice her discouragement with not having access to her own transportation.  Encouraged pt to utilize the local news to identify events in the community that she can look forward to, note these dates on her calendar, and call ahead for transportation.  Pt felt encouraged to have access to the news and verbalized that she would practice negotiating through the app at home this week.  Remaining visit will continue to focus on maximizing safety and indep at home as she adjusts to a new home environment.  Planning discharge next visit.  PERFORMANCE DEFICITS in functional skills including IADLs and pain, cognitive skills including memory and information processing , and psychosocial skills including environmental adaptation and interpersonal interactions.   IMPAIRMENTS are limiting patient from IADLs, leisure, and social participation.   COMORBIDITIES may have co-morbidities  that affects occupational performance. Patient will benefit from skilled OT to address above impairments and improve overall function.  MODIFICATION OR ASSISTANCE TO COMPLETE EVALUATION: Min-Moderate modification of tasks or assist with assess necessary to complete an evaluation.  OT  OCCUPATIONAL PROFILE AND HISTORY: Detailed assessment: Review of records and additional review of physical, cognitive, psychosocial history related to current functional performance.  CLINICAL DECISION MAKING: Moderate - several treatment options, min-mod task modification necessary  REHAB POTENTIAL: Good  EVALUATION COMPLEXITY: High   PLAN: OT FREQUENCY: 1x/week  OT DURATION: 12 weeks  PLANNED INTERVENTIONS: self care/ADL training, therapeutic activity, cognitive remediation/compensation, and psychosocial skills training  RECOMMENDED OTHER SERVICES: PT for shoulder rehab (referral requested and faxed to surgeon)  CONSULTED AND AGREED WITH PLAN OF CARE: Patient  PLAN FOR NEXT SESSION: discharge   Leta Speller, MS, OTR/L   Darleene Cleaver, OT 01/31/2022, 3:53 PM

## 2022-02-01 ENCOUNTER — Telehealth: Payer: Self-pay | Admitting: Primary Care

## 2022-02-01 ENCOUNTER — Telehealth (INDEPENDENT_AMBULATORY_CARE_PROVIDER_SITE_OTHER): Payer: Medicare Other | Admitting: Primary Care

## 2022-02-01 ENCOUNTER — Other Ambulatory Visit: Payer: Self-pay | Admitting: Psychiatry

## 2022-02-01 ENCOUNTER — Telehealth: Payer: Self-pay

## 2022-02-01 DIAGNOSIS — J439 Emphysema, unspecified: Secondary | ICD-10-CM

## 2022-02-01 DIAGNOSIS — G4733 Obstructive sleep apnea (adult) (pediatric): Secondary | ICD-10-CM | POA: Diagnosis not present

## 2022-02-01 DIAGNOSIS — Z87891 Personal history of nicotine dependence: Secondary | ICD-10-CM

## 2022-02-01 MED ORDER — DESVENLAFAXINE ER 50 MG PO TB24: 1.0000 | ORAL_TABLET | Freq: Every day | ORAL | 5 refills | Status: DC

## 2022-02-01 NOTE — Telephone Encounter (Signed)
It was an in lab sleep study and the form was faxed to [facility] on 12/24/21 but never received anything about it being scheduled. I can email Myrtie Cruise at [facility] health and ask if it was done

## 2022-02-01 NOTE — Telephone Encounter (Signed)
Ordered

## 2022-02-01 NOTE — Telephone Encounter (Signed)
Pt left a message that she needs a refill on the pristiq. That you were taking over care of this medication. Pt last seen on 01-21-22 next appt 11-13     Desvenlafaxine ER (PRISTIQ) 50 MG TB24 Medication Date: 08/21/2021 Department: [facility] [place] Documenting: Fredderick Severance Authorizing: [provider]   Order Providers  Prescribing Provider Encounter Provider  [provider] Juline Patch, MD   Outpatient Medication Detail   Disp Refills Start End   Desvenlafaxine ER (PRISTIQ) 50 MG TB24   08/17/2021    Sig - Route: Take 1 tablet by mouth at bedtime. psych - Oral   Class: Historical Med

## 2022-02-01 NOTE — Progress Notes (Signed)
Reviewed and agree with assessment/plan.   Chesley Mires, MD [facility] Pulmonary/Critical Care 02/01/2022, 12:59 PM Pager:  [phone]

## 2022-02-01 NOTE — Telephone Encounter (Signed)
Pt was left a message that rx was sent to pharmacy

## 2022-02-01 NOTE — Telephone Encounter (Signed)
Has sleep study on patient been scheduled? I am speaking with her to go over LDCT results and wanted to give her an update if able.

## 2022-02-01 NOTE — Telephone Encounter (Signed)
She has not had yet, I confirmed with her. When they call to schedule use home number [phone]

## 2022-02-01 NOTE — Progress Notes (Signed)
Virtual Visit via Video Note  I connected with Angela Fernandez on 02/01/22 at 12:00 PM EDT by a video enabled telemedicine application and verified that I am speaking with the correct person using two identifiers.  Location: Patient: Home Provider: Office    I discussed the limitations of evaluation and management by telemedicine and the availability of in person appointments. The patient expressed understanding and agreed to proceed.  History of Present Illness: 76 year old female, former smoker quit in 2018. PMH significant for HTN, OSA, diabetes, hyperlipidemia.   Previous [facility] encounter: 12/21/2021 Patient presents today for sleep consult for hx sleep apnea. She had a previous sleep study in [place] approximately 12 years ago, results are not in chart. [facility] [place]. She was on CPAP but stopped wearing 1 year ago. She was experiencing a lot air leaks and getting a lot of noise from her machine which bothered her. She was compliant with use and does report benefit from wearing. She has lost approximately 40 lbs within the last year. She is sleeping better but still wakes up at night. She is unsure if she snores. He current machine is older than 5 years.  She has a lot of difficulty following instructions and using technology.  She will be attending occupational therapy to help her use a smart phone.  Denies symptoms of narcolepsy, cataplexy or sleepwalking.  Sleep questionnaire Symptoms-  Not falling asleep, waking up during the night , possible loud snoring, restless sleep, daytime sleepiness  Prior sleep study- [place] >12 years ago  Bedtime- 7-8:30pm Time to fall asleep- several hours Nocturnal awakenings- 3-4 times Out of bed/start of day- 7am  Weight changes- down 40 lbs Do you operate heavy machinery- No Do you currently wear CPAP- Not currently Do you current wear oxygen- No Epworth- 6 Medications- Ambien '5mg'$ , Trazodone '50mg'$ , Tramadol, Percocet,  Latuda, Wellbutrin, Klonopin, Pristiq, neuronin   02/01/2022- Interim hx Patient contacted today to review LDCT results. LDCT on 12/25/21 showed centrilobular emphysema. Residual smoking related respiratory bronchiolitis. Calcified granulomas and bibasilar scarring. Pulmonary nodules measuring 3.4m or less in size. No suspicious pulmonary nodules. She had enlarged pulmonic trunk indicative of pulmonary arterial hypertension. She has sleep apnea and has been off CPAP for 1 year. NPSG has not yet been scheduled, we will follow-up on this. When she does resume CPAP she prefer nasal pillow mask   Breathing wise she is stable. She is minimally symptomatic. She does not get out of breath often. She is able to all ADLs. She does not use Anoro every day.    Observations/Objective:  Appears well; No acute respiratory symptoms   Assessment and Plan:  Hx sleep apnea: - [place] stopped [place] her  CPAP 1 year ago. She has lost 40 lbs but continued to have symptoms of snoring, insomnia, restless sleep and daytime sleepiness. NPSG was ordered but has not been scheduled yet, we will look into this.   Emphysema: - Seen on CT imaging - Resume Anoro daily if having respiratory symptoms   Former smoker: - LDCT 12/25/21 >> Lung RADS 2. Small pulmonary nodules measuring 2.57mor less. No suspicious nodules. Follow up annually.   Follow Up Instructions:  Call after sleep study for results and CPAP order    I discussed the assessment and treatment plan with the patient. The patient was provided an opportunity to ask questions and all were answered. The patient agreed  with the plan and demonstrated an understanding of the instructions.   The patient was advised to call back or seek an in-person evaluation if the symptoms worsen or if the condition fails to improve as anticipated.  I provided 22 minutes of non-face-to-face time during this encounter.   Martyn Ehrich, NP

## 2022-02-02 ENCOUNTER — Ambulatory Visit: Payer: Medicare Other

## 2022-02-02 DIAGNOSIS — M25511 Pain in right shoulder: Secondary | ICD-10-CM

## 2022-02-02 DIAGNOSIS — M25619 Stiffness of unspecified shoulder, not elsewhere classified: Secondary | ICD-10-CM

## 2022-02-02 DIAGNOSIS — R262 Difficulty in walking, not elsewhere classified: Secondary | ICD-10-CM | POA: Diagnosis not present

## 2022-02-02 DIAGNOSIS — M6281 Muscle weakness (generalized): Secondary | ICD-10-CM

## 2022-02-02 DIAGNOSIS — Z96611 Presence of right artificial shoulder joint: Secondary | ICD-10-CM

## 2022-02-02 NOTE — Therapy (Signed)
OUTPATIENT PHYSICAL THERAPY TREATMENT   Patient Name: Angela Fernandez MRN: [id number] DOB:[birth year], 76 y.o., female Today's Date: 02/02/2022   PT End of Session - 02/02/22 1350     Visit Number 13    Number of Visits 25    Date for PT Re-Evaluation 02/26/22    Authorization Type [employer] Medicare    Authorization Time Period 12/04/2021- 02/26/2022    Progress Note Due on Visit 20    PT Start Time 1350    PT Stop Time 1430    PT Time Calculation (min) 40 min    Activity Tolerance Patient tolerated treatment well;No increased pain    Behavior During Therapy WFL for tasks assessed/performed              Past Medical History:  Diagnosis Date   Anemia    Arthritis    Bipolar affect, depressed ([place])    COPD (chronic obstructive pulmonary disease) (HCC)    Diabetes mellitus without complication (HCC)    type II   GERD (gastroesophageal reflux disease)    Hyperlipidemia    Hypertension    Hypothyroidism    PTSD (post-traumatic stress disorder)    Schizoaffective disorder ([place])    Sleep apnea    cpap   Stroke ([place])    hx of  mini stroke - 2001   TIA (transient ischemic attack) 2010   UTI (urinary tract infection)    Past Surgical History:  Procedure Laterality Date   BILATERAL CARPAL TUNNEL RELEASE Bilateral    BREAST BIOPSY     BREAST SURGERY Right    lumpectomy   CATARACT EXTRACTION W/ INTRAOCULAR LENS  IMPLANT, BILATERAL Bilateral    CHOLECYSTECTOMY     COLONOSCOPY     ESOPHAGOGASTRODUODENOSCOPY     EYE SURGERY     JOINT REPLACEMENT     NASAL SINUS SURGERY     REPLACEMENT TOTAL KNEE BILATERAL Bilateral    REVERSE SHOULDER ARTHROPLASTY Right 09/02/2020   Procedure: REVERSE SHOULDER ARTHROPLASTY;  Surgeon: Corky Mull, MD;  Location: [facility];  Service: Orthopedics;  Laterality: Right;   SHOULDER SURGERY Right 2020   rotator cuff repair   TOTAL SHOULDER REVISION Right 10/23/2021   Procedure: REVERSE SHOULDER REVISION;  Surgeon: Nicholes Stairs, MD;   Location: [facility];  Service: Orthopedics;  Laterality: Right;  180   Patient Active Problem List   Diagnosis Date Noted   Hx of sleep apnea 12/21/2021   S/P reverse total shoulder arthroplasty, right 10/23/2021   Cystocele with rectocele 04/10/2021   Mixed urinary incontinence due to female genital prolapse 04/10/2021   Submucous leiomyoma of uterus 04/10/2021   Thickened endometrium 04/10/2021   Pelvic pressure in female 02/11/2021   DJD (degenerative joint disease) of cervical spine 12/28/2020   Anxiety, generalized 12/28/2020   History of depression 12/28/2020   Memory difficulty 12/28/2020   Chronic venous insufficiency 12/24/2020   Lymphedema 12/24/2020   Diabetes ([place]) 12/24/2020   Hyperlipidemia 12/24/2020   Essential hypertension 12/24/2020   Closed fracture of neck of right scapula 10/17/2020   Status post reverse total shoulder replacement, right 09/02/2020   Rotator cuff arthropathy, right 07/14/2020    PCP: Adrian Prows  REFERRING PROVIDER: Dr. Victorino December ([facility])  REFERRING DIAG: s/p Right Reverse Total shoulder arthroplasty (REVISION) 10/23/2021  THERAPY DIAG:  Difficulty in walking, not elsewhere classified  Muscle weakness (generalized)  Right shoulder pain, unspecified chronicity  Limited range of motion (ROM) of shoulder  S/P reverse total shoulder arthroplasty, right  Rationale for Evaluation and Treatment Rehabilitation  ONSET DATE: 10/23/2021  SUBJECTIVE:  Pt reports HEP continues to go well. Pain remains well managed. She says her doctor wrote a script for pain medication, but her DTR keeps those medications away from her.   PERTINENT HISTORY: Angela Fernandez is a 76 y.o. female who complains of right shoulder pain and dysfunction following a elective reverse arthroplasty performed over in [place] a number of months ago.  She had early failure of the baseplate with loosening and now malposition.  PAIN:  Are you having pain? Yes: NPRS  scale: 0/10 Pain location: superior and ant shoulder Pain description: ache mostly; but sharp with movement Aggravating factors: active movement; reaching Relieving factors: Rest; Ice; tramadol  PRECAUTIONS: Shoulder- in demographics section   PATIENT GOALS  I want to be able to reach the top of head and also paint as well  OBJECTIVE:   TODAY'S TREATMENT: 02/02/22  THEREX:  ROM work -Seated Table slides AA/ROM BUE 10x5secH: flexion/scaption; RUE only ABDCT  Strength and motor control training  SUPERSET 1 -Standing Right shoulder ABDCT within pain-free range 1x12 (cues to stay away from margin of stiffness)  *mirror for visual accuracy  -Standing Right shoulder flexion within pain-free range 1x12 (cues to stay away from margin of stiffness)  *mirror for visual accuracy  -seated neck scarf triceps extension RedTB 1x15 -standing pushup at treadmill bar 1x12 Stand distance ad lib  -seated downward row 17.5lb 1x10   SUPERSET 2 Standing Right shoulder ABDCT within pain-free range 1x12 (cues to stay away from margin of stiffness)  *mirror for visual accuracy  -Standing Right shoulder flexion within pain-free range 1x12 (cues to stay away from margin of stiffness)  *mirror for visual accuracy  -seated neck scarf triceps extension GreenTB 1x15 -standing pushup at treadmill bar 1x12 Stand distance ad lib  -seated downward row 14.5lb 1x15   PATIENT EDUCATION: Education details: Exercise technique; progression of HEP Person educated: Patient Education method: Explanation, Demonstration, Tactile cues, and Verbal cues Education comprehension: verbalized understanding, returned demonstration, verbal cues required, tactile cues required, and needs further education   HOME EXERCISE PROGRAM: Access Code: [email or web address] URL: [email or web address] Date: 12/24/2021 Prepared by: Sande Brothers  Exercises - Supine Shoulder Flexion Extension AAROM with Dowel  - 3 x weekly  - 3 sets - 10 reps - Supine Shoulder External Rotation in 45 Degrees Abduction AAROM with Dowel  - 3 x weekly - 3 sets - 10 reps - Supine Shoulder Scaption with Dowel  - 3 x weekly - 3 sets - 10 reps - Scapular Retraction with Resistance  - 1 x daily - 3 x weekly - 3 sets - 10 reps - Shoulder External Rotation and Scapular Retraction with Resistance  - 1 x daily - 3 x weekly - 3 sets - 10 reps - Standing Serratus Punch with Resistance  - 1 x daily - 3 x weekly - 3 sets - 10 reps - Standing Single Arm Elbow Flexion with Resistance  - 1 x daily - 3 x weekly - 3 sets - 10 reps - Standing Shoulder Shrugs  - 1 x daily - 3 x weekly - 3 sets - 10 reps     Access Code: [email or web address] URL: [email or web address] Date: 12/16/2021 Prepared by: Sande Brothers  Exercises - Supine Shoulder Flexion AAROM with Hands Clasped  - 1 x daily - 3 sets - 10 reps - Supine Shoulder Flexion Extension Full Range AROM  - 1 x daily - 3 sets - 10 reps -  Supine Shoulder Internal Rotation  - 1 x daily - 3 sets - 10 reps - Supine Shoulder External Rotation Stretch  - 1 x daily - 3 sets - 10 reps - Supine Scapular Retraction  - 1 x daily - 3 sets - 10 reps  Reviewed active elbow flex/ext/forearm sup/pronation/wrist ext/flex    ASSESSMENT:  CLINICAL IMPRESSION:  Pt did strengthening, joint ranging, motor control training. Chief Strategy Officer provided instruction, corrective cues, activity modification, and encouragement. Pt continues to grow mobility of joint, but RUE remains very weak, functionally limited. Pt remains focused and optimistic, please with current progress Pt will continue to benefit from skilled therapy to address remaining deficits in order to improve overall QoL and return to PLOF.    OBJECTIVE IMPAIRMENTS decreased activity tolerance, decreased mobility, decreased ROM, decreased strength, decreased safety awareness, hypomobility, impaired flexibility, impaired UE functional use, postural dysfunction,  and pain.   ACTIVITY LIMITATIONS carrying, lifting, sleeping, bathing, dressing, reach over head, and hygiene/grooming  PARTICIPATION LIMITATIONS: meal prep, cleaning, laundry, driving, shopping, community activity, and yard work  PERSONAL FACTORS Behavior pattern and 1-2 comorbidities: COPD, Mult shoulder surgeries  are also affecting patient's functional outcome.   REHAB POTENTIAL: Good  CLINICAL DECISION MAKING: Evolving/moderate complexity  EVALUATION COMPLEXITY: Low   GOALS: Goals reviewed with patient? Yes  SHORT TERM GOALS: Target date: 02/02/2022  (Remove Blue Hyperlink)  Pt will be independent with initial HEP in order to improve strength and decrease pain in order to improve pain-free function at home and work. Baseline: 12/04/2021- No formal HEP in place Goal status: INITIAL   LONG TERM GOALS: Target date: 03/16/2022  (Remove Blue Hyperlink)  Pt will be independent with Final HEP in order to improve strength and decrease pain in order to improve pain-free function at home and work. Baseline: 12/04/2021- No formal HEP in place Goal status: INITIAL  2.  Pt will decrease quick DASH score by at least 8% in order to demonstrate clinically significant reduction in disability. Baseline: 12/04/2021= 61; 9/14: 47.73 % Goal status: Goal Met  3.  Pt will improve FOTO to target score of 51% to display perceived improvements in ability to complete ADL's.  Baseline: 12/04/2021=40; 9/14: 54%  Goal status: Goal Met  4.  Pt will decrease worst pain as reported on NPRS by at least 3 points in order to demonstrate clinically significant reduction in pain. Baseline: 12/04/2021= 6/10 Right shoulder pain; 9/14 - 4/10 worst pain over past week Goal status: On-Going  5.  Patient will demo > 140 deg of right shoulder elevation (PROM) for improved ROM and overhead activities Baseline: 12/04/2021=PROM R shoulder flex- 118; 105 ABD; 9/14 - R Shoulder  Goal status: INITIAL  6.  Patient will demo  > 100 deg of right shoulder elevation (AROM) for improved ROM and overhead activities Baseline: 12/04/2021=PROM R shoulder flex- 118; 105 ABD; 9/14 - R Shoulder Flexion: 142 deg; Abduction: 121 deg. Goal status: Goal Met  7.  Patient will be able to mop 2 rooms at home without increase in pain to demonstrate increased strength without pain.   Baseline: 01/21/2022 - Pt states she is only able to mop 1 room without pain and any more it jumps up to 4/10. Goal status: New   PLAN: PT FREQUENCY: 2x/week  PT DURATION: 12 weeks  PLANNED INTERVENTIONS: Therapeutic exercises, Therapeutic activity, Patient/Family education, Self Care, Joint mobilization, Dry Needling, Spinal manipulation, Spinal mobilization, Cryotherapy, Moist heat, scar mobilization, Taping, Ultrasound, and Manual therapy  PLAN FOR NEXT SESSION: Manual  Therapy for ROM and advance HEP as appropriate.    1:56 PM, 02/02/22 Etta Grandchild, PT, DPT Physical Therapist - [facility]  [phone] [facility])

## 2022-02-04 ENCOUNTER — Ambulatory Visit: Payer: Medicare Other

## 2022-02-04 DIAGNOSIS — Z96611 Presence of right artificial shoulder joint: Secondary | ICD-10-CM

## 2022-02-04 DIAGNOSIS — M25511 Pain in right shoulder: Secondary | ICD-10-CM

## 2022-02-04 DIAGNOSIS — J209 Acute bronchitis, unspecified: Secondary | ICD-10-CM | POA: Diagnosis not present

## 2022-02-04 DIAGNOSIS — F259 Schizoaffective disorder, unspecified: Secondary | ICD-10-CM

## 2022-02-04 DIAGNOSIS — R4189 Other symptoms and signs involving cognitive functions and awareness: Secondary | ICD-10-CM

## 2022-02-04 DIAGNOSIS — J440 Chronic obstructive pulmonary disease with acute lower respiratory infection: Secondary | ICD-10-CM | POA: Diagnosis not present

## 2022-02-04 DIAGNOSIS — J441 Chronic obstructive pulmonary disease with (acute) exacerbation: Secondary | ICD-10-CM | POA: Diagnosis not present

## 2022-02-04 DIAGNOSIS — M6281 Muscle weakness (generalized): Secondary | ICD-10-CM

## 2022-02-04 DIAGNOSIS — M25619 Stiffness of unspecified shoulder, not elsewhere classified: Secondary | ICD-10-CM

## 2022-02-04 DIAGNOSIS — J019 Acute sinusitis, unspecified: Secondary | ICD-10-CM | POA: Diagnosis not present

## 2022-02-04 DIAGNOSIS — R262 Difficulty in walking, not elsewhere classified: Secondary | ICD-10-CM | POA: Diagnosis not present

## 2022-02-04 NOTE — Therapy (Signed)
OUTPATIENT PHYSICAL THERAPY TREATMENT   Patient Name: Angela Fernandez MRN: [id number] DOB:19-Dec-1945, 76 y.o., female Today's Date: 02/05/2022   PT End of Session - 02/04/22 1141     Visit Number 14    Number of Visits 25    Date for PT Re-Evaluation 02/26/22    Authorization Type [employer] Medicare    Authorization Time Period 12/04/2021- 02/26/2022    Progress Note Due on Visit 20    PT Start Time 1145    PT Stop Time 1225    PT Time Calculation (min) 40 min    Activity Tolerance Patient tolerated treatment well;No increased pain    Behavior During Therapy WFL for tasks assessed/performed              Past Medical History:  Diagnosis Date   Anemia    Arthritis    Bipolar affect, depressed ([place])    COPD (chronic obstructive pulmonary disease) (HCC)    Diabetes mellitus without complication (HCC)    type II   GERD (gastroesophageal reflux disease)    Hyperlipidemia    Hypertension    Hypothyroidism    PTSD (post-traumatic stress disorder)    Schizoaffective disorder ([place])    Sleep apnea    cpap   Stroke ([place])    hx of  mini stroke - 2001   TIA (transient ischemic attack) 2010   UTI (urinary tract infection)    Past Surgical History:  Procedure Laterality Date   BILATERAL CARPAL TUNNEL RELEASE Bilateral    BREAST BIOPSY     BREAST SURGERY Right    lumpectomy   CATARACT EXTRACTION W/ INTRAOCULAR LENS  IMPLANT, BILATERAL Bilateral    CHOLECYSTECTOMY     COLONOSCOPY     ESOPHAGOGASTRODUODENOSCOPY     EYE SURGERY     JOINT REPLACEMENT     NASAL SINUS SURGERY     REPLACEMENT TOTAL KNEE BILATERAL Bilateral    REVERSE SHOULDER ARTHROPLASTY Right 09/02/2020   Procedure: REVERSE SHOULDER ARTHROPLASTY;  Surgeon: Corky Mull, MD;  Location: [facility] ORS;  Service: Orthopedics;  Laterality: Right;   SHOULDER SURGERY Right 2020   rotator cuff repair   TOTAL SHOULDER REVISION Right 10/23/2021   Procedure: REVERSE SHOULDER REVISION;  Surgeon: Nicholes Stairs, MD;   Location: [facility];  Service: Orthopedics;  Laterality: Right;  180   Patient Active Problem List   Diagnosis Date Noted   Hx of sleep apnea 12/21/2021   S/P reverse total shoulder arthroplasty, right 10/23/2021   Cystocele with rectocele 04/10/2021   Mixed urinary incontinence due to female genital prolapse 04/10/2021   Submucous leiomyoma of uterus 04/10/2021   Thickened endometrium 04/10/2021   Pelvic pressure in female 02/11/2021   DJD (degenerative joint disease) of cervical spine 12/28/2020   Anxiety, generalized 12/28/2020   History of depression 12/28/2020   Memory difficulty 12/28/2020   Chronic venous insufficiency 12/24/2020   Lymphedema 12/24/2020   Diabetes ([place]) 12/24/2020   Hyperlipidemia 12/24/2020   Essential hypertension 12/24/2020   Closed fracture of neck of right scapula 10/17/2020   Status post reverse total shoulder replacement, right 09/02/2020   Rotator cuff arthropathy, right 07/14/2020    PCP: Adrian Prows  REFERRING PROVIDER: Dr. Victorino December ([facility])  REFERRING DIAG: s/p Right Reverse Total shoulder arthroplasty (REVISION) 10/23/2021  THERAPY DIAG:  Muscle weakness (generalized)  Right shoulder pain, unspecified chronicity  Limited range of motion (ROM) of shoulder  S/P reverse total shoulder arthroplasty, right  Rationale for Evaluation and Treatment Rehabilitation  ONSET DATE: 10/23/2021  SUBJECTIVE:  Patient reports continuing to do well overall - states her neck was sore after last visit but no lingering effects.   PERTINENT HISTORY: Angela Fernandez is a 76 y.o. female who complains of right shoulder pain and dysfunction following a elective reverse arthroplasty performed over in [place] a number of months ago.  She had early failure of the baseplate with loosening and now malposition.  PAIN:  Are you having pain? Yes: NPRS scale: 0/10 Pain location: superior and ant shoulder Pain description: ache mostly; but sharp with  movement Aggravating factors: active movement; reaching Relieving factors: Rest; Ice; tramadol  PRECAUTIONS: Shoulder- in demographics section   PATIENT GOALS  I want to be able to reach the top of head and also paint as well  OBJECTIVE:   TODAY'S TREATMENT: 02/05/22  THEREX:  AROM  (3- way deltoid) at mirror- Flex/abd/Ext 2 sets of 15 reps within range without UT compensation.   Push up (leaning against rail of TM) 2 sets of 12 reps  Bicep curl - GTB 2 sets of 12 reps  Tricep kick back - (leaning forward onto mat) - 2# RUE x 12 reps x 2 sets  Seated scapular rows (elbows in at side) - GTB 2 sets of 12 reps  Seratus punch (active standing) 2 sets of 12 reps  PATIENT EDUCATION: Education details: Exercise technique; progression of HEP Person educated: Patient Education method: Explanation, Demonstration, Tactile cues, and Verbal cues Education comprehension: verbalized understanding, returned demonstration, verbal cues required, tactile cues required, and needs further education   HOME EXERCISE PROGRAM: Access Code: [email or web address] URL: [email or web address] Date: 12/24/2021 Prepared by: Sande Brothers  Exercises - Supine Shoulder Flexion Extension AAROM with Dowel  - 3 x weekly - 3 sets - 10 reps - Supine Shoulder External Rotation in 45 Degrees Abduction AAROM with Dowel  - 3 x weekly - 3 sets - 10 reps - Supine Shoulder Scaption with Dowel  - 3 x weekly - 3 sets - 10 reps - Scapular Retraction with Resistance  - 1 x daily - 3 x weekly - 3 sets - 10 reps - Shoulder External Rotation and Scapular Retraction with Resistance  - 1 x daily - 3 x weekly - 3 sets - 10 reps - Standing Serratus Punch with Resistance  - 1 x daily - 3 x weekly - 3 sets - 10 reps - Standing Single Arm Elbow Flexion with Resistance  - 1 x daily - 3 x weekly - 3 sets - 10 reps - Standing Shoulder Shrugs  - 1 x daily - 3 x weekly - 3 sets - 10 reps     Access Code: [email or web address] URL:  [email or web address] Date: 12/16/2021 Prepared by: Sande Brothers  Exercises - Supine Shoulder Flexion AAROM with Hands Clasped  - 1 x daily - 3 sets - 10 reps - Supine Shoulder Flexion Extension Full Range AROM  - 1 x daily - 3 sets - 10 reps - Supine Shoulder Internal Rotation  - 1 x daily - 3 sets - 10 reps - Supine Shoulder External Rotation Stretch  - 1 x daily - 3 sets - 10 reps - Supine Scapular Retraction  - 1 x daily - 3 sets - 10 reps  Reviewed active elbow flex/ext/forearm sup/pronation/wrist ext/flex    ASSESSMENT:  CLINICAL IMPRESSION:  Pt continued strength training per protocol focusing on scapula and deltoid strengthening. Author provided instruction, corrective cues, activity modification to avoid excessive Mobility compensation strategies. Pt struggled with resistive  band and encouraged to perform more active ROM to continue to improve her ROM/strength.   Pt will continue to benefit from skilled therapy to address remaining deficits in order to improve overall QoL and return to PLOF.    OBJECTIVE IMPAIRMENTS decreased activity tolerance, decreased mobility, decreased ROM, decreased strength, decreased safety awareness, hypomobility, impaired flexibility, impaired UE functional use, postural dysfunction, and pain.   ACTIVITY LIMITATIONS carrying, lifting, sleeping, bathing, dressing, reach over head, and hygiene/grooming  PARTICIPATION LIMITATIONS: meal prep, cleaning, laundry, driving, shopping, community activity, and yard work  PERSONAL FACTORS Behavior pattern and 1-2 comorbidities: COPD, Mult shoulder surgeries  are also affecting patient's functional outcome.   REHAB POTENTIAL: Good  CLINICAL DECISION MAKING: Evolving/moderate complexity  EVALUATION COMPLEXITY: Low   GOALS: Goals reviewed with patient? Yes  SHORT TERM GOALS: Target date: 02/02/2022  (Remove Blue Hyperlink)  Pt will be independent with initial HEP in order to improve  strength and decrease pain in order to improve pain-free function at home and work. Baseline: 12/04/2021- No formal HEP in place Goal status: INITIAL   LONG TERM GOALS: Target date: 03/16/2022  (Remove Blue Hyperlink)  Pt will be independent with Final HEP in order to improve strength and decrease pain in order to improve pain-free function at home and work. Baseline: 12/04/2021- No formal HEP in place Goal status: INITIAL  2.  Pt will decrease quick DASH score by at least 8% in order to demonstrate clinically significant reduction in disability. Baseline: 12/04/2021= 61; 9/14: 47.73 % Goal status: Goal Met  3.  Pt will improve FOTO to target score of 51% to display perceived improvements in ability to complete ADL's.  Baseline: 12/04/2021=40; 9/14: 54%  Goal status: Goal Met  4.  Pt will decrease worst pain as reported on NPRS by at least 3 points in order to demonstrate clinically significant reduction in pain. Baseline: 12/04/2021= 6/10 Right shoulder pain; 9/14 - 4/10 worst pain over past week Goal status: On-Going  5.  Patient will demo > 140 deg of right shoulder elevation (PROM) for improved ROM and overhead activities Baseline: 12/04/2021=PROM R shoulder flex- 118; 105 ABD; 9/14 - R Shoulder  Goal status: INITIAL  6.  Patient will demo > 100 deg of right shoulder elevation (AROM) for improved ROM and overhead activities Baseline: 12/04/2021=PROM R shoulder flex- 118; 105 ABD; 9/14 - R Shoulder Flexion: 142 deg; Abduction: 121 deg. Goal status: Goal Met  7.  Patient will be able to mop 2 rooms at home without increase in pain to demonstrate increased strength without pain.   Baseline: 01/21/2022 - Pt states she is only able to mop 1 room without pain and any more it jumps up to 4/10. Goal status: New   PLAN: PT FREQUENCY: 2x/week  PT DURATION: 12 weeks  PLANNED INTERVENTIONS: Therapeutic exercises, Therapeutic activity, Patient/Family education, Self Care, Joint mobilization,  Dry Needling, Spinal manipulation, Spinal mobilization, Cryotherapy, Moist heat, scar mobilization, Taping, Ultrasound, and Manual therapy  PLAN FOR NEXT SESSION: Manual Therapy for ROM and advance HEP as appropriate.    12:16 PM, 02/05/22 Ollen Bowl, PT Physical Therapist - [facility]  [phone] [facility])

## 2022-02-07 NOTE — Therapy (Signed)
OUTPATIENT OCCUPATIONAL THERAPY DISCHARGE NOTE   Patient Name: Angela Fernandez MRN: [id number] DOB:September 03, 1945, 76 y.o., female Today's Date: 02/07/2022  PCP: Dr. Leonel Ramsay REFERRING PROVIDER: Dr. Norman Clay   OT End of Session - 02/07/22 1640     Visit Number 12    Number of Visits 12    Date for OT Re-Evaluation 02/04/22    Authorization Time Period Reporting period beginning 01/21/22-02/04/22    OT Start Time 1105    OT Stop Time 1130    OT Time Calculation (min) 25 min    Activity Tolerance Patient tolerated treatment well    Behavior During Therapy WFL for tasks assessed/performed               Past Medical History:  Diagnosis Date   Anemia    Arthritis    Bipolar affect, depressed ([place])    COPD (chronic obstructive pulmonary disease) ([place])    Diabetes mellitus without complication ([place])    type II   GERD (gastroesophageal reflux disease)    Hyperlipidemia    Hypertension    Hypothyroidism    PTSD (post-traumatic stress disorder)    Schizoaffective disorder ([place])    Sleep apnea    cpap   Stroke ([place])    hx of  mini stroke - 2001   TIA (transient ischemic attack) 2010   UTI (urinary tract infection)    Past Surgical History:  Procedure Laterality Date   BILATERAL CARPAL TUNNEL RELEASE Bilateral    BREAST BIOPSY     BREAST SURGERY Right    lumpectomy   CATARACT EXTRACTION W/ INTRAOCULAR LENS  IMPLANT, BILATERAL Bilateral    CHOLECYSTECTOMY     COLONOSCOPY     ESOPHAGOGASTRODUODENOSCOPY     EYE SURGERY     JOINT REPLACEMENT     NASAL SINUS SURGERY     REPLACEMENT TOTAL KNEE BILATERAL Bilateral    REVERSE SHOULDER ARTHROPLASTY Right 09/02/2020   Procedure: REVERSE SHOULDER ARTHROPLASTY;  Surgeon: Corky Mull, MD;  Location: [facility] ORS;  Service: Orthopedics;  Laterality: Right;   SHOULDER SURGERY Right 2020   rotator cuff repair   TOTAL SHOULDER REVISION Right 10/23/2021   Procedure: REVERSE SHOULDER REVISION;  Surgeon: Nicholes Stairs, MD;  Location: [facility];  Service: Orthopedics;  Laterality: Right;  180   Patient Active Problem List   Diagnosis Date Noted   Hx of sleep apnea 12/21/2021   S/P reverse total shoulder arthroplasty, right 10/23/2021   Cystocele with rectocele 04/10/2021   Mixed urinary incontinence due to female genital prolapse 04/10/2021   Submucous leiomyoma of uterus 04/10/2021   Thickened endometrium 04/10/2021   Pelvic pressure in female 02/11/2021   DJD (degenerative joint disease) of cervical spine 12/28/2020   Anxiety, generalized 12/28/2020   History of depression 12/28/2020   Memory difficulty 12/28/2020   Chronic venous insufficiency 12/24/2020   Lymphedema 12/24/2020   Diabetes ([place]) 12/24/2020   Hyperlipidemia 12/24/2020   Essential hypertension 12/24/2020   Closed fracture of neck of right scapula 10/17/2020   Status post reverse total shoulder replacement, right 09/02/2020   Rotator cuff arthropathy, right 07/14/2020    ONSET DATE: 10/13/21 (referral date)  REFERRING DIAG: Cognitive decline, schizoaffective disorder, unspecified type  THERAPY DIAG:  Schizoaffective disorder, unspecified type ([place])  Cognitive decline  Rationale for Evaluation and Treatment Rehabilitation  SUBJECTIVE: Pt reports she's going to the doctor today about her cough and congestion.  Pt accompanied by: self  PERTINENT HISTORY: Per chart, Schizoaffective disorder, PTSD,  anxiety, depression, insomnia, sleep apnea, occasional passive SI, occasional AH (voices); Recent R reverse TSA on 10/23/21.  Per pt, she moved from [place], approximately 1 year and 4-5 months ago to move in with her daughter and granddaughter.  Pt reports she is actively looking for a house to move out, with plans to have granddaughter live with her.  Pt reports being eager to be more indep in her living situation.  PAIN:  Are you having pain? Pt reports 5/10 pain in bilat feet, back, L shoulder, and neck.  Rest helps to ease  pain.   PATIENT GOALS : to move out and live more independently, possibly with granddaughter living with her.    OBJECTIVE:  Self Care:  Pt continues to work towards increasing access to the community.  OT provided instruction in accessing her voice mail messages and removing unwanted messages.  Pt was able to independently perform this task after initial demo from OT.  Pt reported that she had missed some calls and should have followed up but did not know how to check her messages.  OT encouraged pt make a plan to sit down and check messages at least 2x per day, making this a part of her daily routine, and encouraged pt to check messages at least 1 time in the early afternoon so that return calls can be made as necessary before the end of a business day, now that pt understands how to navigate messages on her phone.  Reviewed and updated remaining goals in poc for discharge this date.     PATIENT EDUCATION: Education details: community access  Person educated: Patient Education method: Explanation  Education comprehension: verbalized understanding, mod A  GOALS: Goals reviewed with patient? Yes  SHORT TERM GOALS: Target date: 12/24/21    Pt will utilize visual reminders to independently identify emergency numbers and frequent contacts (ie family members, [place] offices) to call when in need of assistance. Baseline: Pt was unsure whether "911" was the correct number to call for a house fire and reported being unable to search for information on her phone to identify people/places; 01/21/22: new "911" label taped to back of pt's cell phone, and written in bold letters at front of her planner.  Pt has family member's contact info in her cell phone and knows how to access these numbers. Goal status: achieved  2.  Pt will be indep to address an envelope for mailing a bill or letter. Baseline: Pt forgetful to her zip code and left off the PO box number on a simulated utility bill; 01/21/22: indep; pt  indep to recall her new address but does have it written in her planner as well, and demos consistent and accurate ability to fill out an envelope to mail a mock bill.  Goal status: indep  3. Pt will complete Pill Box Assessment Test to determine level of assist needed for medication management.  Baseline: Not yet completed; 01/21/22: Pill box assessment completed, but no longer applicable as daughter will continue to manage pt's meds.  Goal status: achieved  4. Pt will develop organizational system (Ie: calendar/chart) to identify when monthly bills are due for timely bill paying.  Baseline: Not yet initiated; 01/21/22: pt is consisently using a paper planner to keep herself organized, but daughter will continue to pay her bills.  Goal status: discharge/no longer applicable   LONG TERM GOALS: Target date: 02/04/22    Pt will write checks for monthly bills with min supv for accuracy and completion.  Baseline: Daughter currently manages bills; pt reports daughter will continue to manage her monthly bills, though pt demos indep with filling out checks with mock bills. Goal status: No longer applicable  2.  Pt will manage medications with mod A. (May revise supervision level following completion of Pill Box assessment) Baseline: Daughter currently manages medications; 01/21/22: daughter will continue to manage meds (pt prefers this) Goal status: no longer applicable  3.  Pt will be indep to utilize smart phone to look up desired information for increasing community engagement and socialization (locations and phone numbers for local restaurants, movie theaters, parks) Baseline: Pt reports she does not know how to look up information on her smart phone (Brief instruction given at eval but additional training needed); pt is using cell phone with mod vc to look up locations/phone numbers/desired information; 02/04/22: Pt reports she feels more confident to use her phone, has important numbers save, can  now access her voicemail, but requires min-mod vc to navigate phone when performing a google search.  Pt has step-by-step written directions for google searches and has been encouraged to continue to practice using her phone to look up desired information; granddaughter also able to assist as needed.  Goal status: partially met  4.  Pt will demo indep with use of map quest/google maps or a like app to increase indep with community mobility. Baseline: Pt can drive to a few familiar locations (granddaughter's place of work, MD offices), but otherwise does not know how to utilize phone to access new locations; 01/21/22: pt will no longer be driving herself within community as her car is unable to be fixed and she doesn't have the finances at this time to replace it.  Will continue to review how to search for desired addresses on smart phone to notify Medicaid transportation of desired destinations (02/04/22: pt performs with min vc) Goal status: partially met  ASSESSMENT:  CLINICAL IMPRESSION: Pt has been seen for 12 OT sessions for IADL training, home safety, organizational strategies, and strategies to increase access to community.  Pt has met or partially met all OT goals.  Pt is now living in her own home with her granddaughter and granddaughter's boyfriend; granddaughter and daughter continue to provide support to ensure safety in the home.  See note for goal completion.  No additional skilled OT indicated at this time.     PERFORMANCE DEFICITS in functional skills including IADLs and pain, cognitive skills including memory and information processing , and psychosocial skills including environmental adaptation and interpersonal interactions.   IMPAIRMENTS are limiting patient from IADLs, leisure, and social participation.   COMORBIDITIES may have co-morbidities  that affects occupational performance. Patient will benefit from skilled OT to address above impairments and improve overall  function.  MODIFICATION OR ASSISTANCE TO COMPLETE EVALUATION: Min-Moderate modification of tasks or assist with assess necessary to complete an evaluation.  OT OCCUPATIONAL PROFILE AND HISTORY: Detailed assessment: Review of records and additional review of physical, cognitive, psychosocial history related to current functional performance.  CLINICAL DECISION MAKING: Moderate - several treatment options, min-mod task modification necessary  REHAB POTENTIAL: Good  EVALUATION COMPLEXITY: High   PLAN: OT FREQUENCY: 1x/week  OT DURATION: 12 weeks  PLANNED INTERVENTIONS: self care/ADL training, therapeutic activity, cognitive remediation/compensation, and psychosocial skills training  RECOMMENDED OTHER SERVICES: PT for shoulder rehab (referral requested and faxed to surgeon)  CONSULTED AND AGREED WITH PLAN OF CARE: Patient  PLAN FOR NEXT SESSION: discharge   Leta Speller, MS, OTR/L   Izora Gala  Romeo Apple, OT 02/07/2022, 4:42 PM

## 2022-02-09 ENCOUNTER — Ambulatory Visit: Payer: Medicare Other

## 2022-02-09 ENCOUNTER — Ambulatory Visit: Payer: Medicare Other | Attending: Psychiatry

## 2022-02-09 DIAGNOSIS — R4189 Other symptoms and signs involving cognitive functions and awareness: Secondary | ICD-10-CM | POA: Diagnosis present

## 2022-02-09 DIAGNOSIS — M25511 Pain in right shoulder: Secondary | ICD-10-CM | POA: Diagnosis not present

## 2022-02-09 DIAGNOSIS — Z96611 Presence of right artificial shoulder joint: Secondary | ICD-10-CM | POA: Insufficient documentation

## 2022-02-09 DIAGNOSIS — M6281 Muscle weakness (generalized): Secondary | ICD-10-CM | POA: Insufficient documentation

## 2022-02-09 DIAGNOSIS — F259 Schizoaffective disorder, unspecified: Secondary | ICD-10-CM | POA: Insufficient documentation

## 2022-02-09 DIAGNOSIS — R262 Difficulty in walking, not elsewhere classified: Secondary | ICD-10-CM | POA: Diagnosis not present

## 2022-02-09 DIAGNOSIS — M25619 Stiffness of unspecified shoulder, not elsewhere classified: Secondary | ICD-10-CM | POA: Diagnosis not present

## 2022-02-09 NOTE — Therapy (Signed)
OUTPATIENT PHYSICAL THERAPY TREATMENT   Patient Name: Angela Fernandez MRN: [id number] DOB:05/18/1945, 76 y.o., female Today's Date: 02/09/2022   PT End of Session - 02/09/22 1504     Visit Number 15    Number of Visits 25    Date for PT Re-Evaluation 02/26/22    Authorization Type U[employer] Medicare    Authorization Time Period 12/04/2021- 02/26/2022    Progress Note Due on Visit 20    PT Start Time 1505    PT Stop Time 1549    PT Time Calculation (min) 44 min    Activity Tolerance Patient tolerated treatment well;No increased pain    Behavior During Therapy WFL for tasks assessed/performed               Past Medical History:  Diagnosis Date   Anemia    Arthritis    Bipolar affect, depressed ([place])    COPD (chronic obstructive pulmonary disease) (HCC)    Diabetes mellitus without complication (HCC)    type II   GERD (gastroesophageal reflux disease)    Hyperlipidemia    Hypertension    Hypothyroidism    PTSD (post-traumatic stress disorder)    Schizoaffective disorder ([place])    Sleep apnea    cpap   Stroke ([place])    hx of  mini stroke - 2001   TIA (transient ischemic attack) 2010   UTI (urinary tract infection)    Past Surgical History:  Procedure Laterality Date   BILATERAL CARPAL TUNNEL RELEASE Bilateral    BREAST BIOPSY     BREAST SURGERY Right    lumpectomy   CATARACT EXTRACTION W/ INTRAOCULAR LENS  IMPLANT, BILATERAL Bilateral    CHOLECYSTECTOMY     COLONOSCOPY     ESOPHAGOGASTRODUODENOSCOPY     EYE SURGERY     JOINT REPLACEMENT     NASAL SINUS SURGERY     REPLACEMENT TOTAL KNEE BILATERAL Bilateral    REVERSE SHOULDER ARTHROPLASTY Right 09/02/2020   Procedure: REVERSE SHOULDER ARTHROPLASTY;  Surgeon: Corky Mull, MD;  Location: [facility] ORS;  Service: Orthopedics;  Laterality: Right;   SHOULDER SURGERY Right 2020   rotator cuff repair   TOTAL SHOULDER REVISION Right 10/23/2021   Procedure: REVERSE SHOULDER REVISION;  Surgeon: Nicholes Stairs, MD;   Location: [facility];  Service: Orthopedics;  Laterality: Right;  180   Patient Active Problem List   Diagnosis Date Noted   Hx of sleep apnea 12/21/2021   S/P reverse total shoulder arthroplasty, right 10/23/2021   Cystocele with rectocele 04/10/2021   Mixed urinary incontinence due to female genital prolapse 04/10/2021   Submucous leiomyoma of uterus 04/10/2021   Thickened endometrium 04/10/2021   Pelvic pressure in female 02/11/2021   DJD (degenerative joint disease) of cervical spine 12/28/2020   Anxiety, generalized 12/28/2020   History of depression 12/28/2020   Memory difficulty 12/28/2020   Chronic venous insufficiency 12/24/2020   Lymphedema 12/24/2020   Diabetes ([place]) 12/24/2020   Hyperlipidemia 12/24/2020   Essential hypertension 12/24/2020   Closed fracture of neck of right scapula 10/17/2020   Status post reverse total shoulder replacement, right 09/02/2020   Rotator cuff arthropathy, right 07/14/2020    PCP: Adrian Prows  REFERRING PROVIDER: Dr. Victorino December ([facility])  REFERRING DIAG: s/p Right Reverse Total shoulder arthroplasty (REVISION) 10/23/2021  THERAPY DIAG:  Muscle weakness (generalized)  Right shoulder pain, unspecified chronicity  Limited range of motion (ROM) of shoulder  S/P reverse total shoulder arthroplasty, right  Rationale for Evaluation and Treatment Rehabilitation  ONSET DATE: 10/23/2021  SUBJECTIVE:  Patient reports shoulder is feeling pretty good- Been working on my HEP with the bands.    PERTINENT HISTORY: Angela Fernandez is a 76 y.o. female who complains of right shoulder pain and dysfunction following a elective reverse arthroplasty performed over in [place] a number of months ago.  She had early failure of the baseplate with loosening and now malposition.  PAIN:  Are you having pain? Yes: NPRS scale: 0/10 Pain location: superior and ant shoulder Pain description: ache mostly; but sharp with movement Aggravating factors:  active movement; reaching Relieving factors: Rest; Ice; tramadol  PRECAUTIONS: Shoulder- in demographics section   PATIENT GOALS  I want to be able to reach the top of head and also paint as well  OBJECTIVE:   TODAY'S TREATMENT: 02/09/22  THEREX: Performed on Right UE  Wall push up (support bar) x 12 reps x 2 sets   AROM  (3- way deltoid) at mirror- Flex/abd/Ext 2 sets of 12 reps within range without UT compensation. (Patient able to use mirrow with less VC to prevent UT compensation.)   Bicep curl standing Right UE- GTB 2 sets of 12 reps  Tricep kick back - (leaning forward onto mat) - 2# RUE x 12 reps x 2 sets  Seated scapular rows (elbows in at side) - GTB 2 sets of 12 reps  Seratus punch (active standing) 2 sets of 12 reps  Shoulder shrug using GTB - 2 sets of 12 reps  Posterior shoulder roll 2 sets of 12 reps   Scapular mobilization- A/P, Sup/inf/Diagonals x 10 reps each  Prone I, Y, t, ext, row (AROM) x 12 reps (difficulty with shoulder flex/scaption)   Prone scap row with 2# dumb bell x 12 reps  Shoulder Internal/external rotation (standing active in front of mirror) 2 sets of 12 reps  PATIENT EDUCATION: Education details: Exercise technique; progression of HEP Person educated: Patient Education method: Explanation, Demonstration, Tactile cues, and Verbal cues Education comprehension: verbalized understanding, returned demonstration, verbal cues required, tactile cues required, and needs further education   HOME EXERCISE PROGRAM:  Access Code: [email or web address] URL: [email or web address] Date: 02/09/2022 Prepared by: Sande Brothers  Exercises - Standing Shoulder Flexion with Resistance  - 1 x daily - 3 x weekly - 3 sets - 10 reps - Standing Single Arm Shoulder Abduction with Resistance  - 1 x daily - 3 x weekly - 3 sets - 10 reps - Shoulder External Rotation and Scapular Retraction  - 1 x daily - 3 x weekly - 3 sets - 10 reps - Prone Single Arm  Shoulder Y  - 1 x daily - 3 x weekly - 3 sets - 10 reps - Prone Shoulder Horizontal Abduction  - 1 x daily - 3 x weekly - 3 sets - 10 reps - Prone Shoulder Extension - Single Arm  - 1 x daily - 3 x weekly - 3 sets - 10 reps - Prone Shoulder Row  - 1 x daily - 3 x weekly - 3 sets - 10 reps    Access Code: [email or web address] URL: [email or web address] Date: 12/24/2021 Prepared by: Sande Brothers  Exercises - Supine Shoulder Flexion Extension AAROM with Dowel  - 3 x weekly - 3 sets - 10 reps - Supine Shoulder External Rotation in 45 Degrees Abduction AAROM with Dowel  - 3 x weekly - 3 sets - 10 reps - Supine Shoulder Scaption with Dowel  - 3 x weekly - 3 sets - 10 reps - Scapular Retraction  with Resistance  - 1 x daily - 3 x weekly - 3 sets - 10 reps - Shoulder External Rotation and Scapular Retraction with Resistance  - 1 x daily - 3 x weekly - 3 sets - 10 reps - Standing Serratus Punch with Resistance  - 1 x daily - 3 x weekly - 3 sets - 10 reps - Standing Single Arm Elbow Flexion with Resistance  - 1 x daily - 3 x weekly - 3 sets - 10 reps - Standing Shoulder Shrugs  - 1 x daily - 3 x weekly - 3 sets - 10 reps     Access Code: [email or web address] URL: [email or web address] Date: 12/16/2021 Prepared by: Sande Brothers  Exercises - Supine Shoulder Flexion AAROM with Hands Clasped  - 1 x daily - 3 sets - 10 reps - Supine Shoulder Flexion Extension Full Range AROM  - 1 x daily - 3 sets - 10 reps - Supine Shoulder Internal Rotation  - 1 x daily - 3 sets - 10 reps - Supine Shoulder External Rotation Stretch  - 1 x daily - 3 sets - 10 reps - Supine Scapular Retraction  - 1 x daily - 3 sets - 10 reps  Reviewed active elbow flex/ext/forearm sup/pronation/wrist ext/flex    ASSESSMENT:  CLINICAL IMPRESSION:  Patient presented with good motivation for today's session. She was able to progress with her protocol well and add more ROM/strengthening today without  significant report of increased pain. She was able to follow all VC and visual technique for safe exercises. She presented with difficulty in standing later shoulder elevation exercises yet no difficulty with shoulder ext or scapular rows today.  Pt will continue to benefit from skilled therapy to address remaining deficits in order to improve overall QoL and return to PLOF.    OBJECTIVE IMPAIRMENTS decreased activity tolerance, decreased mobility, decreased ROM, decreased strength, decreased safety awareness, hypomobility, impaired flexibility, impaired UE functional use, postural dysfunction, and pain.   ACTIVITY LIMITATIONS carrying, lifting, sleeping, bathing, dressing, reach over head, and hygiene/grooming  PARTICIPATION LIMITATIONS: meal prep, cleaning, laundry, driving, shopping, community activity, and yard work  PERSONAL FACTORS Behavior pattern and 1-2 comorbidities: COPD, Mult shoulder surgeries  are also affecting patient's functional outcome.   REHAB POTENTIAL: Good  CLINICAL DECISION MAKING: Evolving/moderate complexity  EVALUATION COMPLEXITY: Low   GOALS: Goals reviewed with patient? Yes  SHORT TERM GOALS: Target date: 02/02/2022  (Remove Blue Hyperlink)  Pt will be independent with initial HEP in order to improve strength and decrease pain in order to improve pain-free function at home and work. Baseline: 12/04/2021- No formal HEP in place Goal status: INITIAL   LONG TERM GOALS: Target date: 03/16/2022  (Remove Blue Hyperlink)  Pt will be independent with Final HEP in order to improve strength and decrease pain in order to improve pain-free function at home and work. Baseline: 12/04/2021- No formal HEP in place Goal status: INITIAL  2.  Pt will decrease quick DASH score by at least 8% in order to demonstrate clinically significant reduction in disability. Baseline: 12/04/2021= 61; 9/14: 47.73 % Goal status: Goal Met  3.  Pt will improve FOTO to target score of 51% to  display perceived improvements in ability to complete ADL's.  Baseline: 12/04/2021=40; 9/14: 54%  Goal status: Goal Met  4.  Pt will decrease worst pain as reported on NPRS by at least 3 points in order to demonstrate clinically significant reduction in pain. Baseline: 12/04/2021= 6/10 Right shoulder pain; 9/14 - 4/10 worst  pain over past week Goal status: On-Going  5.  Patient will demo > 140 deg of right shoulder elevation (PROM) for improved ROM and overhead activities Baseline: 12/04/2021=PROM R shoulder flex- 118; 105 ABD; 9/14 - R Shoulder  Goal status: INITIAL  6.  Patient will demo > 100 deg of right shoulder elevation (AROM) for improved ROM and overhead activities Baseline: 12/04/2021=PROM R shoulder flex- 118; 105 ABD; 9/14 - R Shoulder Flexion: 142 deg; Abduction: 121 deg. Goal status: Goal Met  7.  Patient will be able to mop 2 rooms at home without increase in pain to demonstrate increased strength without pain.   Baseline: 01/21/2022 - Pt states she is only able to mop 1 room without pain and any more it jumps up to 4/10. Goal status: New   PLAN: PT FREQUENCY: 2x/week  PT DURATION: 12 weeks  PLANNED INTERVENTIONS: Therapeutic exercises, Therapeutic activity, Patient/Family education, Self Care, Joint mobilization, Dry Needling, Spinal manipulation, Spinal mobilization, Cryotherapy, Moist heat, scar mobilization, Taping, Ultrasound, and Manual therapy  PLAN FOR NEXT SESSION: Manual Therapy for ROM and advance HEP as appropriate.    3:53 PM, 02/09/22 Ollen Bowl, PT Physical Therapist - [facility]  [phone] [facility])

## 2022-02-11 ENCOUNTER — Ambulatory Visit: Payer: Medicare Other

## 2022-02-11 DIAGNOSIS — M6281 Muscle weakness (generalized): Secondary | ICD-10-CM | POA: Diagnosis not present

## 2022-02-11 DIAGNOSIS — Z96611 Presence of right artificial shoulder joint: Secondary | ICD-10-CM | POA: Diagnosis not present

## 2022-02-11 DIAGNOSIS — R262 Difficulty in walking, not elsewhere classified: Secondary | ICD-10-CM | POA: Diagnosis not present

## 2022-02-11 DIAGNOSIS — M25619 Stiffness of unspecified shoulder, not elsewhere classified: Secondary | ICD-10-CM | POA: Diagnosis not present

## 2022-02-11 DIAGNOSIS — M25511 Pain in right shoulder: Secondary | ICD-10-CM

## 2022-02-11 NOTE — Therapy (Signed)
OUTPATIENT PHYSICAL THERAPY TREATMENT   Patient Name: Angela Fernandez MRN: [id number] DOB:12-17-1945, 76 y.o., female Today's Date: 02/11/2022   PT End of Session - 02/11/22 1139     Visit Number 16    Number of Visits 25    Date for PT Re-Evaluation 02/26/22    Authorization Type UHC Medicare    Authorization Time Period 12/04/2021- 02/26/2022    Progress Note Due on Visit 20    PT Start Time 1135    PT Stop Time 1215    PT Time Calculation (min) 40 min    Activity Tolerance Patient tolerated treatment well;No increased pain    Behavior During Therapy WFL for tasks assessed/performed               Past Medical History:  Diagnosis Date   Anemia    Arthritis    Bipolar affect, depressed ([place])    COPD (chronic obstructive pulmonary disease) (HCC)    Diabetes mellitus without complication (HCC)    type II   GERD (gastroesophageal reflux disease)    Hyperlipidemia    Hypertension    Hypothyroidism    PTSD (post-traumatic stress disorder)    Schizoaffective disorder ([place])    Sleep apnea    cpap   Stroke ([place])    hx of  mini stroke - 2001   TIA (transient ischemic attack) 2010   UTI (urinary tract infection)    Past Surgical History:  Procedure Laterality Date   BILATERAL CARPAL TUNNEL RELEASE Bilateral    BREAST BIOPSY     BREAST SURGERY Right    lumpectomy   CATARACT EXTRACTION W/ INTRAOCULAR LENS  IMPLANT, BILATERAL Bilateral    CHOLECYSTECTOMY     COLONOSCOPY     ESOPHAGOGASTRODUODENOSCOPY     EYE SURGERY     JOINT REPLACEMENT     NASAL SINUS SURGERY     REPLACEMENT TOTAL KNEE BILATERAL Bilateral    REVERSE SHOULDER ARTHROPLASTY Right 09/02/2020   Procedure: REVERSE SHOULDER ARTHROPLASTY;  Surgeon: Corky Mull, MD;  Location: [facility] ORS;  Service: Orthopedics;  Laterality: Right;   SHOULDER SURGERY Right 2020   rotator cuff repair   TOTAL SHOULDER REVISION Right 10/23/2021   Procedure: REVERSE SHOULDER REVISION;  Surgeon: Nicholes Stairs, MD;   Location: [facility];  Service: Orthopedics;  Laterality: Right;  180   Patient Active Problem List   Diagnosis Date Noted   Hx of sleep apnea 12/21/2021   S/P reverse total shoulder arthroplasty, right 10/23/2021   Cystocele with rectocele 04/10/2021   Mixed urinary incontinence due to female genital prolapse 04/10/2021   Submucous leiomyoma of uterus 04/10/2021   Thickened endometrium 04/10/2021   Pelvic pressure in female 02/11/2021   DJD (degenerative joint disease) of cervical spine 12/28/2020   Anxiety, generalized 12/28/2020   History of depression 12/28/2020   Memory difficulty 12/28/2020   Chronic venous insufficiency 12/24/2020   Lymphedema 12/24/2020   Diabetes ([place]) 12/24/2020   Hyperlipidemia 12/24/2020   Essential hypertension 12/24/2020   Closed fracture of neck of right scapula 10/17/2020   Status post reverse total shoulder replacement, right 09/02/2020   Rotator cuff arthropathy, right 07/14/2020    PCP: Adrian Prows  REFERRING PROVIDER: Dr. Victorino December ([facility])  REFERRING DIAG: s/p Right Reverse Total shoulder arthroplasty (REVISION) 10/23/2021  THERAPY DIAG:  Muscle weakness (generalized)  Right shoulder pain, unspecified chronicity  Limited range of motion (ROM) of shoulder  S/P reverse total shoulder arthroplasty, right  Rationale for Evaluation and Treatment Rehabilitation  ONSET DATE: 10/23/2021  SUBJECTIVE:  Patient reports she is sore not from exercises but from doing some yard work yesterday- [employer] for bonfire and even raked some.    PERTINENT HISTORY: Angela Fernandez is a 76 y.o. female who complains of right shoulder pain and dysfunction following a elective reverse arthroplasty performed over in [place] a number of months ago.  She had early failure of the baseplate with loosening and now malposition.  PAIN:  Are you having pain? Yes: NPRS scale: 5/10 Pain location: Right side neck pain Pain description: ache  mostly; but sharp with movement Aggravating factors: active movement; reaching Relieving factors: Rest; Ice; tramadol  PRECAUTIONS: Shoulder- in demographics section   PATIENT GOALS  I want to be able to reach the top of head and also paint as well  OBJECTIVE:   TODAY'S TREATMENT: 02/11/22  THEREX: Performed on Right UE  Wall push up (support bar) x 12 reps x 2 sets   Wall posture stretch - hold 60 sec x 2 sets ( VC and visual demo for chin retraction)   AROM  (3- way deltoid) at mirror- Flex/abd/Ext 2 sets of 12 reps within range without UT compensation. ( using mirrow)   Bicep curl standing Right UE- GTB 2 sets of 12 reps (VC for forearm supination)   Tricep kick back - (leaning forward onto support bar) - 2# RUE x 12 reps x 2 sets  Stand scapular rows (elbows in at side) - GTB 2 sets of 12 reps ( VC to extend arms then flex and pull elbows back)   Seratus punch into scap retraction (active standing) 2 sets of 12 reps   Posterior shoulder roll 2 sets of 12 reps   Scapular mobilization- A/P, Sup/inf/Diagonals x 10 reps each  Prone  Y, t, ext, (AROM) x 12 reps (difficulty with shoulder flex/scaption)   Prone scap row with 2# dumb bell x 12 reps  Shoulder Internal/external rotation (standing active in front of mirror) 2 sets of 12 reps  PATIENT EDUCATION: Education details: Exercise technique; progression of HEP Person educated: Patient Education method: Explanation, Demonstration, Tactile cues, and Verbal cues Education comprehension: verbalized understanding, returned demonstration, verbal cues required, tactile cues required, and needs further education   HOME EXERCISE PROGRAM:  Access Code: [email or web address] URL: [email or web address] Date: 02/09/2022 Prepared by: Sande Brothers  Exercises - Standing Shoulder Flexion with Resistance  - 1 x daily - 3 x weekly - 3 sets - 10 reps - Standing Single Arm Shoulder Abduction with Resistance  - 1 x daily - 3  x weekly - 3 sets - 10 reps - Shoulder External Rotation and Scapular Retraction  - 1 x daily - 3 x weekly - 3 sets - 10 reps - Prone Single Arm Shoulder Y  - 1 x daily - 3 x weekly - 3 sets - 10 reps - Prone Shoulder Horizontal Abduction  - 1 x daily - 3 x weekly - 3 sets - 10 reps - Prone Shoulder Extension - Single Arm  - 1 x daily - 3 x weekly - 3 sets - 10 reps - Prone Shoulder Row  - 1 x daily - 3 x weekly - 3 sets - 10 reps    Access Code: [email or web address] URL: [email or web address] Date: 12/24/2021 Prepared by: Sande Brothers  Exercises - Supine Shoulder Flexion Extension AAROM with Dowel  - 3 x weekly - 3 sets - 10 reps - Supine Shoulder External Rotation in 45 Degrees Abduction AAROM with Dowel  -  3 x weekly - 3 sets - 10 reps - Supine Shoulder Scaption with Dowel  - 3 x weekly - 3 sets - 10 reps - Scapular Retraction with Resistance  - 1 x daily - 3 x weekly - 3 sets - 10 reps - Shoulder External Rotation and Scapular Retraction with Resistance  - 1 x daily - 3 x weekly - 3 sets - 10 reps - Standing Serratus Punch with Resistance  - 1 x daily - 3 x weekly - 3 sets - 10 reps - Standing Single Arm Elbow Flexion with Resistance  - 1 x daily - 3 x weekly - 3 sets - 10 reps - Standing Shoulder Shrugs  - 1 x daily - 3 x weekly - 3 sets - 10 reps     Access Code: [email or web address] URL: [email or web address] Date: 12/16/2021 Prepared by: Sande Brothers  Exercises - Supine Shoulder Flexion AAROM with Hands Clasped  - 1 x daily - 3 sets - 10 reps - Supine Shoulder Flexion Extension Full Range AROM  - 1 x daily - 3 sets - 10 reps - Supine Shoulder Internal Rotation  - 1 x daily - 3 sets - 10 reps - Supine Shoulder External Rotation Stretch  - 1 x daily - 3 sets - 10 reps - Supine Scapular Retraction  - 1 x daily - 3 sets - 10 reps  Reviewed active elbow flex/ext/forearm sup/pronation/wrist ext/flex    ASSESSMENT:  CLINICAL IMPRESSION:  Patient  continued with progressive UE strengthening with good performance as seen by reps and amount of exercise. She is progressing her shoulder strength with less overall compensation. Pt will continue to benefit from skilled therapy to address remaining deficits in order to improve overall QoL and return to PLOF.    OBJECTIVE IMPAIRMENTS decreased activity tolerance, decreased mobility, decreased ROM, decreased strength, decreased safety awareness, hypomobility, impaired flexibility, impaired UE functional use, postural dysfunction, and pain.   ACTIVITY LIMITATIONS carrying, lifting, sleeping, bathing, dressing, reach over head, and hygiene/grooming  PARTICIPATION LIMITATIONS: meal prep, cleaning, laundry, driving, shopping, community activity, and yard work  PERSONAL FACTORS Behavior pattern and 1-2 comorbidities: COPD, Mult shoulder surgeries  are also affecting patient's functional outcome.   REHAB POTENTIAL: Good  CLINICAL DECISION MAKING: Evolving/moderate complexity  EVALUATION COMPLEXITY: Low   GOALS: Goals reviewed with patient? Yes  SHORT TERM GOALS: Target date: 02/02/2022  (Remove Blue Hyperlink)  Pt will be independent with initial HEP in order to improve strength and decrease pain in order to improve pain-free function at home and work. Baseline: 12/04/2021- No formal HEP in place Goal status: INITIAL   LONG TERM GOALS: Target date: 03/16/2022  (Remove Blue Hyperlink)  Pt will be independent with Final HEP in order to improve strength and decrease pain in order to improve pain-free function at home and work. Baseline: 12/04/2021- No formal HEP in place Goal status: INITIAL  2.  Pt will decrease quick DASH score by at least 8% in order to demonstrate clinically significant reduction in disability. Baseline: 12/04/2021= 61; 9/14: 47.73 % Goal status: Goal Met  3.  Pt will improve FOTO to target score of 51% to display perceived improvements in ability to complete ADL's.   Baseline: 12/04/2021=40; 9/14: 54%  Goal status: Goal Met  4.  Pt will decrease worst pain as reported on NPRS by at least 3 points in order to demonstrate clinically significant reduction in pain. Baseline: 12/04/2021= 6/10 Right shoulder pain; 9/14 - 4/10 worst pain over past week Goal status:  On-Going  5.  Patient will demo > 140 deg of right shoulder elevation (PROM) for improved ROM and overhead activities Baseline: 12/04/2021=PROM R shoulder flex- 118; 105 ABD; 9/14 - R Shoulder  Goal status: INITIAL  6.  Patient will demo > 100 deg of right shoulder elevation (AROM) for improved ROM and overhead activities Baseline: 12/04/2021=PROM R shoulder flex- 118; 105 ABD; 9/14 - R Shoulder Flexion: 142 deg; Abduction: 121 deg. Goal status: Goal Met  7.  Patient will be able to mop 2 rooms at home without increase in pain to demonstrate increased strength without pain.   Baseline: 01/21/2022 - Pt states she is only able to mop 1 room without pain and any more it jumps up to 4/10. Goal status: New   PLAN: PT FREQUENCY: 2x/week  PT DURATION: 12 weeks  PLANNED INTERVENTIONS: Therapeutic exercises, Therapeutic activity, Patient/Family education, Self Care, Joint mobilization, Dry Needling, Spinal manipulation, Spinal mobilization, Cryotherapy, Moist heat, scar mobilization, Taping, Ultrasound, and Manual therapy  PLAN FOR NEXT SESSION: Manual Therapy for ROM and advance HEP as appropriate.    12:52 PM, 02/11/22 Ollen Bowl, PT Physical Therapist - [facility]  [phone] [facility])

## 2022-02-15 ENCOUNTER — Telehealth: Payer: Self-pay

## 2022-02-15 NOTE — Telephone Encounter (Signed)
left message that per the pharmacy they states that they have rx ready for pick up.

## 2022-02-15 NOTE — Telephone Encounter (Signed)
spoke with pharamcy staff and she states that [place] rx is ready for pick up.

## 2022-02-15 NOTE — Telephone Encounter (Signed)
pt called left message that she needs a refill on the [place].

## 2022-02-16 ENCOUNTER — Ambulatory Visit: Payer: Medicare Other

## 2022-02-16 DIAGNOSIS — M25619 Stiffness of unspecified shoulder, not elsewhere classified: Secondary | ICD-10-CM | POA: Diagnosis not present

## 2022-02-16 DIAGNOSIS — M6281 Muscle weakness (generalized): Secondary | ICD-10-CM | POA: Diagnosis not present

## 2022-02-16 DIAGNOSIS — M25511 Pain in right shoulder: Secondary | ICD-10-CM | POA: Diagnosis not present

## 2022-02-16 DIAGNOSIS — R262 Difficulty in walking, not elsewhere classified: Secondary | ICD-10-CM | POA: Diagnosis not present

## 2022-02-16 DIAGNOSIS — Z96611 Presence of right artificial shoulder joint: Secondary | ICD-10-CM

## 2022-02-16 NOTE — Therapy (Signed)
OUTPATIENT PHYSICAL THERAPY TREATMENT   Patient Name: Angela Fernandez MRN: [id number] DOB:01-01-1946, 76 y.o., female Today's Date: 02/16/2022   PT End of Session - 02/16/22 1435     Visit Number 17    Number of Visits 25    Date for PT Re-Evaluation 02/26/22    Authorization Type UHC Medicare    Authorization Time Period 12/04/2021- 02/26/2022    Progress Note Due on Visit 20    PT Start Time 1433    PT Stop Time 1513    PT Time Calculation (min) 40 min    Activity Tolerance Patient tolerated treatment well;No increased pain    Behavior During Therapy WFL for tasks assessed/performed                Past Medical History:  Diagnosis Date   Anemia    Arthritis    Bipolar affect, depressed ([place])    COPD (chronic obstructive pulmonary disease) (HCC)    Diabetes mellitus without complication (HCC)    type II   GERD (gastroesophageal reflux disease)    Hyperlipidemia    Hypertension    Hypothyroidism    PTSD (post-traumatic stress disorder)    Schizoaffective disorder ([place])    Sleep apnea    cpap   Stroke ([place])    hx of  mini stroke - 2001   TIA (transient ischemic attack) 2010   UTI (urinary tract infection)    Past Surgical History:  Procedure Laterality Date   BILATERAL CARPAL TUNNEL RELEASE Bilateral    BREAST BIOPSY     BREAST SURGERY Right    lumpectomy   CATARACT EXTRACTION W/ INTRAOCULAR LENS  IMPLANT, BILATERAL Bilateral    CHOLECYSTECTOMY     COLONOSCOPY     ESOPHAGOGASTRODUODENOSCOPY     EYE SURGERY     JOINT REPLACEMENT     NASAL SINUS SURGERY     REPLACEMENT TOTAL KNEE BILATERAL Bilateral    REVERSE SHOULDER ARTHROPLASTY Right 09/02/2020   Procedure: REVERSE SHOULDER ARTHROPLASTY;  Surgeon: Corky Mull, MD;  Location: [facility] ORS;  Service: Orthopedics;  Laterality: Right;   SHOULDER SURGERY Right 2020   rotator cuff repair   TOTAL SHOULDER REVISION Right 10/23/2021   Procedure: REVERSE SHOULDER REVISION;  Surgeon: Nicholes Stairs, MD;   Location: [facility];  Service: Orthopedics;  Laterality: Right;  180   Patient Active Problem List   Diagnosis Date Noted   Hx of sleep apnea 12/21/2021   S/P reverse total shoulder arthroplasty, right 10/23/2021   Cystocele with rectocele 04/10/2021   Mixed urinary incontinence due to female genital prolapse 04/10/2021   Submucous leiomyoma of uterus 04/10/2021   Thickened endometrium 04/10/2021   Pelvic pressure in female 02/11/2021   DJD (degenerative joint disease) of cervical spine 12/28/2020   Anxiety, generalized 12/28/2020   History of depression 12/28/2020   Memory difficulty 12/28/2020   Chronic venous insufficiency 12/24/2020   Lymphedema 12/24/2020   Diabetes ([place]) 12/24/2020   Hyperlipidemia 12/24/2020   Essential hypertension 12/24/2020   Closed fracture of neck of right scapula 10/17/2020   Status post reverse total shoulder replacement, right 09/02/2020   Rotator cuff arthropathy, right 07/14/2020    PCP: Adrian Prows  REFERRING PROVIDER: Dr. Victorino December ([facility])  REFERRING DIAG: s/p Right Reverse Total shoulder arthroplasty (REVISION) 10/23/2021  THERAPY DIAG:  Muscle weakness (generalized)  Right shoulder pain, unspecified chronicity  Limited range of motion (ROM) of shoulder  S/P reverse total shoulder arthroplasty, right  Rationale for Evaluation and Treatment  Rehabilitation  ONSET DATE: 10/23/2021  SUBJECTIVE:  Patient reports doing well overall- States no pain today. Reports compliance with HEP.   PERTINENT HISTORY: Angela Fernandez is a 76 y.o. female who complains of right shoulder pain and dysfunction following a elective reverse arthroplasty performed over in [place] a number of months ago.  She had early failure of the baseplate with loosening and now malposition.  PAIN:  Are you having pain? no  PRECAUTIONS: Shoulder- in demographics section   PATIENT GOALS  I want to be able to reach the top of head and also paint as  well  OBJECTIVE:   TODAY'S TREATMENT: 02/16/22  THEREX: Performed on Right UE  AAROM for warm- up - with pvc- sitting scaption and ER/IR x 25 reps each  Wall push up (support bar) x 12 reps x 2 sets    AROM  (3- way deltoid) at mirror- Flex/abd/Ext 3 sets of 12 reps within range without UT compensation. ( using mirrow)   Bicep curl standing Right UE-2.5 # using matrix cable system- 3 sets of 12 reps (VC for forearm supination)   Tricep pull down using matrix cable system - VC and demo to keep elbow tucked at side) - 7.5 #  RUE x 15 reps x 2 sets  Stand scapular rows (elbows in at side) - GTB 2 sets of 12 reps ( VC to extend arms then flex and pull elbows back)   Seratus punch into scap retraction (active standing) 2 sets of 12 reps   Posterior shoulder roll 2 sets of 12 reps   Scapular mobilization- A/P, Sup/inf/Diagonals x 10 reps each   Shoulder Internal/external rotation (matrix cable system) 2.5 with yoga block to keep arm abducted - 2 sets of 12 reps  PATIENT EDUCATION: Education details: Exercise technique; progression of HEP Person educated: Patient Education method: Explanation, Demonstration, Tactile cues, and Verbal cues Education comprehension: verbalized understanding, returned demonstration, verbal cues required, tactile cues required, and needs further education   HOME EXERCISE PROGRAM:  Access Code: [email or web address] URL: [email or web address] Date: 02/09/2022 Prepared by: Sande Brothers  Exercises - Standing Shoulder Flexion with Resistance  - 1 x daily - 3 x weekly - 3 sets - 10 reps - Standing Single Arm Shoulder Abduction with Resistance  - 1 x daily - 3 x weekly - 3 sets - 10 reps - Shoulder External Rotation and Scapular Retraction  - 1 x daily - 3 x weekly - 3 sets - 10 reps - Prone Single Arm Shoulder Y  - 1 x daily - 3 x weekly - 3 sets - 10 reps - Prone Shoulder Horizontal Abduction  - 1 x daily - 3 x weekly - 3 sets - 10 reps -  Prone Shoulder Extension - Single Arm  - 1 x daily - 3 x weekly - 3 sets - 10 reps - Prone Shoulder Row  - 1 x daily - 3 x weekly - 3 sets - 10 reps    Access Code: [email or web address] URL: [email or web address] Date: 12/24/2021 Prepared by: Sande Brothers  Exercises - Supine Shoulder Flexion Extension AAROM with Dowel  - 3 x weekly - 3 sets - 10 reps - Supine Shoulder External Rotation in 45 Degrees Abduction AAROM with Dowel  - 3 x weekly - 3 sets - 10 reps - Supine Shoulder Scaption with Dowel  - 3 x weekly - 3 sets - 10 reps - Scapular Retraction with Resistance  - 1 x daily - 3 x weekly - 3 sets -  10 reps - Shoulder External Rotation and Scapular Retraction with Resistance  - 1 x daily - 3 x weekly - 3 sets - 10 reps - Standing Serratus Punch with Resistance  - 1 x daily - 3 x weekly - 3 sets - 10 reps - Standing Single Arm Elbow Flexion with Resistance  - 1 x daily - 3 x weekly - 3 sets - 10 reps - Standing Shoulder Shrugs  - 1 x daily - 3 x weekly - 3 sets - 10 reps     Access Code: [email or web address] URL: [email or web address] Date: 12/16/2021 Prepared by: Sande Brothers  Exercises - Supine Shoulder Flexion AAROM with Hands Clasped  - 1 x daily - 3 sets - 10 reps - Supine Shoulder Flexion Extension Full Range AROM  - 1 x daily - 3 sets - 10 reps - Supine Shoulder Internal Rotation  - 1 x daily - 3 sets - 10 reps - Supine Shoulder External Rotation Stretch  - 1 x daily - 3 sets - 10 reps - Supine Scapular Retraction  - 1 x daily - 3 sets - 10 reps  Reviewed active elbow flex/ext/forearm sup/pronation/wrist ext/flex    ASSESSMENT:  CLINICAL IMPRESSION:  Patient presented with good motivation for today's session. She was able to demonstrate progress with increased reps and use of cable system for resistance today. Overall she is requiring less VC and progressing with less UT compensation. She was able to return demonstration well today to perform safely-  still very limited with lateral shoulder rotation. Pt will continue to benefit from skilled therapy to address remaining deficits in order to improve overall QoL and return to PLOF.    OBJECTIVE IMPAIRMENTS decreased activity tolerance, decreased mobility, decreased ROM, decreased strength, decreased safety awareness, hypomobility, impaired flexibility, impaired UE functional use, postural dysfunction, and pain.   ACTIVITY LIMITATIONS carrying, lifting, sleeping, bathing, dressing, reach over head, and hygiene/grooming  PARTICIPATION LIMITATIONS: meal prep, cleaning, laundry, driving, shopping, community activity, and yard work  PERSONAL FACTORS Behavior pattern and 1-2 comorbidities: COPD, Mult shoulder surgeries  are also affecting patient's functional outcome.   REHAB POTENTIAL: Good  CLINICAL DECISION MAKING: Evolving/moderate complexity  EVALUATION COMPLEXITY: Low   GOALS: Goals reviewed with patient? Yes  SHORT TERM GOALS: Target date: 02/02/2022  (Remove Blue Hyperlink)  Pt will be independent with initial HEP in order to improve strength and decrease pain in order to improve pain-free function at home and work. Baseline: 12/04/2021- No formal HEP in place Goal status: INITIAL   LONG TERM GOALS: Target date: 03/16/2022  (Remove Blue Hyperlink)  Pt will be independent with Final HEP in order to improve strength and decrease pain in order to improve pain-free function at home and work. Baseline: 12/04/2021- No formal HEP in place Goal status: INITIAL  2.  Pt will decrease quick DASH score by at least 8% in order to demonstrate clinically significant reduction in disability. Baseline: 12/04/2021= 61; 9/14: 47.73 % Goal status: Goal Met  3.  Pt will improve FOTO to target score of 51% to display perceived improvements in ability to complete ADL's.  Baseline: 12/04/2021=40; 9/14: 54%  Goal status: Goal Met  4.  Pt will decrease worst pain as reported on NPRS by at least 3 points  in order to demonstrate clinically significant reduction in pain. Baseline: 12/04/2021= 6/10 Right shoulder pain; 9/14 - 4/10 worst pain over past week Goal status: On-Going  5.  Patient will demo > 140 deg of right shoulder elevation (PROM) for  improved ROM and overhead activities Baseline: 12/04/2021=PROM R shoulder flex- 118; 105 ABD; 9/14 - R Shoulder  Goal status: INITIAL  6.  Patient will demo > 100 deg of right shoulder elevation (AROM) for improved ROM and overhead activities Baseline: 12/04/2021=PROM R shoulder flex- 118; 105 ABD; 9/14 - R Shoulder Flexion: 142 deg; Abduction: 121 deg. Goal status: Goal Met  7.  Patient will be able to mop 2 rooms at home without increase in pain to demonstrate increased strength without pain.   Baseline: 01/21/2022 - Pt states she is only able to mop 1 room without pain and any more it jumps up to 4/10. Goal status: New   PLAN: PT FREQUENCY: 2x/week  PT DURATION: 12 weeks  PLANNED INTERVENTIONS: Therapeutic exercises, Therapeutic activity, Patient/Family education, Self Care, Joint mobilization, Dry Needling, Spinal manipulation, Spinal mobilization, Cryotherapy, Moist heat, scar mobilization, Taping, Ultrasound, and Manual therapy  PLAN FOR NEXT SESSION: Manual Therapy for ROM and advance HEP as appropriate.    4:39 PM, 02/16/22 Ollen Bowl, PT Physical Therapist - [facility]  [phone] [facility])

## 2022-02-18 ENCOUNTER — Ambulatory Visit: Payer: Medicare Other

## 2022-02-18 DIAGNOSIS — Z96611 Presence of right artificial shoulder joint: Secondary | ICD-10-CM | POA: Diagnosis not present

## 2022-02-18 DIAGNOSIS — M25619 Stiffness of unspecified shoulder, not elsewhere classified: Secondary | ICD-10-CM

## 2022-02-18 DIAGNOSIS — R4189 Other symptoms and signs involving cognitive functions and awareness: Secondary | ICD-10-CM

## 2022-02-18 DIAGNOSIS — M25511 Pain in right shoulder: Secondary | ICD-10-CM

## 2022-02-18 DIAGNOSIS — F259 Schizoaffective disorder, unspecified: Secondary | ICD-10-CM

## 2022-02-18 DIAGNOSIS — M6281 Muscle weakness (generalized): Secondary | ICD-10-CM | POA: Diagnosis not present

## 2022-02-18 DIAGNOSIS — R262 Difficulty in walking, not elsewhere classified: Secondary | ICD-10-CM | POA: Diagnosis not present

## 2022-02-18 NOTE — Therapy (Signed)
OUTPATIENT PHYSICAL THERAPY TREATMENT   Patient Name: Angela Fernandez MRN: [id number] DOB:1945-08-16, 76 y.o., female Today's Date: 02/18/2022   PT End of Session - 02/18/22 1034     Visit Number 18    Number of Visits 25    Date for PT Re-Evaluation 02/26/22    Authorization Type UHC Medicare    Authorization Time Period 12/04/2021- 02/26/2022    Progress Note Due on Visit 20    PT Start Time 1040    PT Stop Time 1136    PT Time Calculation (min) 56 min    Activity Tolerance Patient tolerated treatment well;No increased pain    Behavior During Therapy WFL for tasks assessed/performed               Past Medical History:  Diagnosis Date   Anemia    Arthritis    Bipolar affect, depressed ([place])    COPD (chronic obstructive pulmonary disease) (HCC)    Diabetes mellitus without complication (HCC)    type II   GERD (gastroesophageal reflux disease)    Hyperlipidemia    Hypertension    Hypothyroidism    PTSD (post-traumatic stress disorder)    Schizoaffective disorder ([place])    Sleep apnea    cpap   Stroke ([place])    hx of  mini stroke - 2001   TIA (transient ischemic attack) 2010   UTI (urinary tract infection)    Past Surgical History:  Procedure Laterality Date   BILATERAL CARPAL TUNNEL RELEASE Bilateral    BREAST BIOPSY     BREAST SURGERY Right    lumpectomy   CATARACT EXTRACTION W/ INTRAOCULAR LENS  IMPLANT, BILATERAL Bilateral    CHOLECYSTECTOMY     COLONOSCOPY     ESOPHAGOGASTRODUODENOSCOPY     EYE SURGERY     JOINT REPLACEMENT     NASAL SINUS SURGERY     REPLACEMENT TOTAL KNEE BILATERAL Bilateral    REVERSE SHOULDER ARTHROPLASTY Right 09/02/2020   Procedure: REVERSE SHOULDER ARTHROPLASTY;  Surgeon: Corky Mull, MD;  Location: [facility] ORS;  Service: Orthopedics;  Laterality: Right;   SHOULDER SURGERY Right 2020   rotator cuff repair   TOTAL SHOULDER REVISION Right 10/23/2021   Procedure: REVERSE SHOULDER REVISION;  Surgeon: Nicholes Stairs, MD;   Location: [facility];  Service: Orthopedics;  Laterality: Right;  180   Patient Active Problem List   Diagnosis Date Noted   Hx of sleep apnea 12/21/2021   S/P reverse total shoulder arthroplasty, right 10/23/2021   Cystocele with rectocele 04/10/2021   Mixed urinary incontinence due to female genital prolapse 04/10/2021   Submucous leiomyoma of uterus 04/10/2021   Thickened endometrium 04/10/2021   Pelvic pressure in female 02/11/2021   DJD (degenerative joint disease) of cervical spine 12/28/2020   Anxiety, generalized 12/28/2020   History of depression 12/28/2020   Memory difficulty 12/28/2020   Chronic venous insufficiency 12/24/2020   Lymphedema 12/24/2020   Diabetes ([place]) 12/24/2020   Hyperlipidemia 12/24/2020   Essential hypertension 12/24/2020   Closed fracture of neck of right scapula 10/17/2020   Status post reverse total shoulder replacement, right 09/02/2020   Rotator cuff arthropathy, right 07/14/2020    PCP: Adrian Prows  REFERRING PROVIDER: Dr. Victorino December ([facility])  REFERRING DIAG: s/p Right Reverse Total shoulder arthroplasty (REVISION) 10/23/2021  THERAPY DIAG:  Muscle weakness (generalized)  Right shoulder pain, unspecified chronicity  Limited range of motion (ROM) of shoulder  S/P reverse total shoulder arthroplasty, right  Schizoaffective disorder, unspecified type ([place])  Cognitive decline  Difficulty in walking, not elsewhere classified  Rationale for Evaluation and Treatment Rehabilitation  ONSET DATE: 10/23/2021  SUBJECTIVE:  Reports having more pain in the neck and shoulders today (along UT distribution). Felt very sore after doing cable exercises last session.  Did not take her medication; also did not take her insulin or check her blood sugar. Reports of being asymptomatic with mild lightheadedness.  PERTINENT HISTORY: Angela Fernandez is a 76 y.o. female who complains of right shoulder pain and dysfunction following a elective reverse  arthroplasty performed over in [place] a number of months ago.  She had early failure of the baseplate with loosening and now malposition.  PAIN:  Are you having pain? no  PRECAUTIONS: Shoulder- in demographics section   PATIENT GOALS  I want to be able to reach the top of head and also paint as well  OBJECTIVE:   TODAY'S TREATMENT: 02/18/22  Manual Therapy: RUE - Sidelying active assist scapular mobilization with overpressure at end range with shoulder flex, scaption, and ABD, x10ea  - GHJ inferior mobilizations with distraction x20mn, end range oscillations, G3  - STM to bil UT, levator scap, and posterior cervical musculature   THEREX: Performed on Right UE AAROM for warm- up - with pvc- sitting scaption and ER/IR x 25 reps each  AROM  (3- way deltoid) at mirror- Flex/abd/Ext 2x12 within range without UT compensation. (using mirror)   Bicep curl standing Right UE-2.5 # using matrix cable system- x15  Tricep pull down using matrix cable system - VC and demo to keep elbow tucked at side) - 2.5 #  RUE  x15  Posterior shoulder roll 2x12  Isometric ER with GTB, 10x10s holds, TC for reduced UT compensation/hiking  Not Billed: - moist hot pack to neck/UT in supine, bolster under knees, x841m   Not performed today: D1 extension D2 flexion to shoulder height with ER Wall push up (support bar) x 12 reps x 2 sets  Stand scapular rows (elbows in at side) - GTB 2 sets of 12 reps ( VC to extend arms then flex and pull elbows back)  Serratus punch into scap retraction (active standing) 2 sets of 12 reps Shoulder Internal/external rotation (matrix cable system) 2.5 with yoga block to keep arm abducted - 2 sets of 12 reps   PATIENT EDUCATION: Education details: Exercise technique; progression of HEP Person educated: Patient Education method: Explanation, Demonstration, Tactile cues, and Verbal cues Education comprehension: verbalized understanding, returned demonstration,  verbal cues required, tactile cues required, and needs further education   HOME EXERCISE PROGRAM:  Access Code: [email or web address] URL: [email or web address] Date: 02/18/2022 Prepared by: SaAmalia HaileyExercises - Standing Shoulder Flexion with Resistance  - 1 x daily - 3 x weekly - 3 sets - 10 reps - Standing Single Arm Shoulder Abduction with Resistance  - 1 x daily - 3 x weekly - 3 sets - 10 reps - Shoulder External Rotation and Scapular Retraction  - 1 x daily - 3 x weekly - 3 sets - 10 reps - Prone Single Arm Shoulder Y  - 1 x daily - 3 x weekly - 3 sets - 10 reps - Prone Shoulder Horizontal Abduction  - 1 x daily - 3 x weekly - 3 sets - 10 reps - Prone Shoulder Extension - Single Arm  - 1 x daily - 3 x weekly - 3 sets - 10 reps - Prone Shoulder Row  - 1 x daily - 3 x weekly - 3 sets -  10 reps - Seated Upper Trapezius Stretch  - 1 x daily - 7 x weekly - 3 sets - 10 reps - 30 hold - Gentle Levator Scapulae Stretch  - 1 x daily - 7 x weekly - 3 sets - 10 reps - 30 hold    Access Code: [email or web address] URL: [email or web address] Date: 12/24/2021 Prepared by: Sande Brothers  Exercises - Supine Shoulder Flexion Extension AAROM with Dowel  - 3 x weekly - 3 sets - 10 reps - Supine Shoulder External Rotation in 45 Degrees Abduction AAROM with Dowel  - 3 x weekly - 3 sets - 10 reps - Supine Shoulder Scaption with Dowel  - 3 x weekly - 3 sets - 10 reps - Scapular Retraction with Resistance  - 1 x daily - 3 x weekly - 3 sets - 10 reps - Shoulder External Rotation and Scapular Retraction with Resistance  - 1 x daily - 3 x weekly - 3 sets - 10 reps - Standing Serratus Punch with Resistance  - 1 x daily - 3 x weekly - 3 sets - 10 reps - Standing Single Arm Elbow Flexion with Resistance  - 1 x daily - 3 x weekly - 3 sets - 10 reps - Standing Shoulder Shrugs  - 1 x daily - 3 x weekly - 3 sets - 10 reps     Access Code: [email or web address] URL:  [email or web address] Date: 12/16/2021 Prepared by: Sande Brothers  Exercises - Supine Shoulder Flexion AAROM with Hands Clasped  - 1 x daily - 3 sets - 10 reps - Supine Shoulder Flexion Extension Full Range AROM  - 1 x daily - 3 sets - 10 reps - Supine Shoulder Internal Rotation  - 1 x daily - 3 sets - 10 reps - Supine Shoulder External Rotation Stretch  - 1 x daily - 3 sets - 10 reps - Supine Scapular Retraction  - 1 x daily - 3 sets - 10 reps  Reviewed active elbow flex/ext/forearm sup/pronation/wrist ext/flex    ASSESSMENT:  CLINICAL IMPRESSION: Pt tolerated treatment well today. Many interventions regressed due to c/o prolonged sorenses/pain after last session and pt reporting she did not take her medicine/insulin this morning. Pt with c/o mild lightheadedness throughout session that was unchanged with interventions; otherwise, pt was asymptomatic. Pt continues to present with UT compensation, likely due to combination of shoulder girdle weakness and and limited GHJ ROM. Improved AROM (prior to UT compensation/shoulder hiking) in flex/scap after manual therapy; ABD unchanged. Unable to progress to ER walk outs as pt was unable to maintain neural ER position; improved ER mm activation with isometric resisted holds. Treatment concluded with moist hot pack to cervical spine/UT with pt reporting overall improvements in pain and soreness. Pt will continue to benefit from skilled OPPT services to address deficits and improve overall RUE pain/function.    OBJECTIVE IMPAIRMENTS decreased activity tolerance, decreased mobility, decreased ROM, decreased strength, decreased safety awareness, hypomobility, impaired flexibility, impaired UE functional use, postural dysfunction, and pain.   ACTIVITY LIMITATIONS carrying, lifting, sleeping, bathing, dressing, reach over head, and hygiene/grooming  PARTICIPATION LIMITATIONS: meal prep, cleaning, laundry, driving, shopping, community  activity, and yard work  PERSONAL FACTORS Behavior pattern and 1-2 comorbidities: COPD, Mult shoulder surgeries  are also affecting patient's functional outcome.   REHAB POTENTIAL: Good  CLINICAL DECISION MAKING: Evolving/moderate complexity  EVALUATION COMPLEXITY: Low   GOALS: Goals reviewed with patient? Yes  SHORT TERM GOALS: Target date: 02/02/2022  (Remove Blue Hyperlink)  Pt will be independent with  initial HEP in order to improve strength and decrease pain in order to improve pain-free function at home and work. Baseline: 12/04/2021- No formal HEP in place Goal status: INITIAL   LONG TERM GOALS: Target date: 03/16/2022  (Remove Blue Hyperlink)  Pt will be independent with Final HEP in order to improve strength and decrease pain in order to improve pain-free function at home and work. Baseline: 12/04/2021- No formal HEP in place Goal status: INITIAL  2.  Pt will decrease quick DASH score by at least 8% in order to demonstrate clinically significant reduction in disability. Baseline: 12/04/2021= 61; 9/14: 47.73 % Goal status: Goal Met  3.  Pt will improve FOTO to target score of 51% to display perceived improvements in ability to complete ADL's.  Baseline: 12/04/2021=40; 9/14: 54%  Goal status: Goal Met  4.  Pt will decrease worst pain as reported on NPRS by at least 3 points in order to demonstrate clinically significant reduction in pain. Baseline: 12/04/2021= 6/10 Right shoulder pain; 9/14 - 4/10 worst pain over past week Goal status: On-Going  5.  Patient will demo > 140 deg of right shoulder elevation (PROM) for improved ROM and overhead activities Baseline: 12/04/2021=PROM R shoulder flex- 118; 105 ABD; 9/14 - R Shoulder  Goal status: INITIAL  6.  Patient will demo > 100 deg of right shoulder elevation (AROM) for improved ROM and overhead activities Baseline: 12/04/2021=PROM R shoulder flex- 118; 105 ABD; 9/14 - R Shoulder Flexion: 142 deg; Abduction: 121 deg. Goal  status: Goal Met  7.  Patient will be able to mop 2 rooms at home without increase in pain to demonstrate increased strength without pain.   Baseline: 01/21/2022 - Pt states she is only able to mop 1 room without pain and any more it jumps up to 4/10. Goal status: New   PLAN: PT FREQUENCY: 2x/week  PT DURATION: 12 weeks  PLANNED INTERVENTIONS: Therapeutic exercises, Therapeutic activity, Patient/Family education, Self Care, Joint mobilization, Dry Needling, Spinal manipulation, Spinal mobilization, Cryotherapy, Moist heat, scar mobilization, Taping, Ultrasound, and Manual therapy  PLAN FOR NEXT SESSION: GHJ mobilizations, ER resisted isometric progression, Manual Therapy for ROM and advance HEP as appropriate.    Herminio Commons, PT, DPT 12:43 PM,02/18/22 Physical Therapist - [facility]

## 2022-02-19 DIAGNOSIS — E1159 Type 2 diabetes mellitus with other circulatory complications: Secondary | ICD-10-CM | POA: Diagnosis not present

## 2022-02-19 DIAGNOSIS — Z96652 Presence of left artificial knee joint: Secondary | ICD-10-CM | POA: Diagnosis not present

## 2022-02-19 DIAGNOSIS — M25562 Pain in left knee: Secondary | ICD-10-CM | POA: Diagnosis not present

## 2022-02-19 DIAGNOSIS — Z794 Long term (current) use of insulin: Secondary | ICD-10-CM | POA: Diagnosis not present

## 2022-02-19 DIAGNOSIS — G8929 Other chronic pain: Secondary | ICD-10-CM | POA: Diagnosis not present

## 2022-02-21 ENCOUNTER — Other Ambulatory Visit: Payer: Self-pay | Admitting: Family Medicine

## 2022-02-21 DIAGNOSIS — E782 Mixed hyperlipidemia: Secondary | ICD-10-CM

## 2022-02-22 DIAGNOSIS — Z794 Long term (current) use of insulin: Secondary | ICD-10-CM | POA: Diagnosis not present

## 2022-02-22 DIAGNOSIS — N1831 Chronic kidney disease, stage 3a: Secondary | ICD-10-CM | POA: Diagnosis not present

## 2022-02-22 DIAGNOSIS — E1159 Type 2 diabetes mellitus with other circulatory complications: Secondary | ICD-10-CM | POA: Diagnosis not present

## 2022-02-22 DIAGNOSIS — I10 Essential (primary) hypertension: Secondary | ICD-10-CM | POA: Diagnosis not present

## 2022-02-22 DIAGNOSIS — J449 Chronic obstructive pulmonary disease, unspecified: Secondary | ICD-10-CM | POA: Diagnosis not present

## 2022-02-22 DIAGNOSIS — E039 Hypothyroidism, unspecified: Secondary | ICD-10-CM | POA: Diagnosis not present

## 2022-02-23 ENCOUNTER — Ambulatory Visit: Payer: Medicare Other

## 2022-02-23 DIAGNOSIS — Z96611 Presence of right artificial shoulder joint: Secondary | ICD-10-CM

## 2022-02-23 DIAGNOSIS — M25619 Stiffness of unspecified shoulder, not elsewhere classified: Secondary | ICD-10-CM | POA: Diagnosis not present

## 2022-02-23 DIAGNOSIS — M25511 Pain in right shoulder: Secondary | ICD-10-CM | POA: Diagnosis not present

## 2022-02-23 DIAGNOSIS — M6281 Muscle weakness (generalized): Secondary | ICD-10-CM | POA: Diagnosis not present

## 2022-02-23 DIAGNOSIS — R262 Difficulty in walking, not elsewhere classified: Secondary | ICD-10-CM | POA: Diagnosis not present

## 2022-02-23 NOTE — Therapy (Signed)
OUTPATIENT PHYSICAL THERAPY TREATMENT   Patient Name: Angela Fernandez MRN: [id number] DOB:[birth year], 76 y.o., female Today's Date: 02/23/2022   PT End of Session - 02/23/22 1040     Visit Number 19    Number of Visits 25    Date for PT Re-Evaluation 02/26/22    Authorization Type UHC Medicare    Authorization Time Period 12/04/2021- 02/26/2022    Progress Note Due on Visit 20    PT Start Time 1015    PT Stop Time 1055    PT Time Calculation (min) 40 min    Activity Tolerance Patient tolerated treatment well;No increased pain    Behavior During Therapy WFL for tasks assessed/performed               Past Medical History:  Diagnosis Date   Anemia    Arthritis    Bipolar affect, depressed ([place])    COPD (chronic obstructive pulmonary disease) (HCC)    Diabetes mellitus without complication (HCC)    type II   GERD (gastroesophageal reflux disease)    Hyperlipidemia    Hypertension    Hypothyroidism    PTSD (post-traumatic stress disorder)    Schizoaffective disorder ([place])    Sleep apnea    cpap   Stroke ([place])    hx of  mini stroke - 2001   TIA (transient ischemic attack) 2010   UTI (urinary tract infection)    Past Surgical History:  Procedure Laterality Date   BILATERAL CARPAL TUNNEL RELEASE Bilateral    BREAST BIOPSY     BREAST SURGERY Right    lumpectomy   CATARACT EXTRACTION W/ INTRAOCULAR LENS  IMPLANT, BILATERAL Bilateral    CHOLECYSTECTOMY     COLONOSCOPY     ESOPHAGOGASTRODUODENOSCOPY     EYE SURGERY     JOINT REPLACEMENT     NASAL SINUS SURGERY     REPLACEMENT TOTAL KNEE BILATERAL Bilateral    REVERSE SHOULDER ARTHROPLASTY Right 09/02/2020   Procedure: REVERSE SHOULDER ARTHROPLASTY;  Surgeon: Corky Mull, MD;  Location: [facility] ORS;  Service: Orthopedics;  Laterality: Right;   SHOULDER SURGERY Right 2020   rotator cuff repair   TOTAL SHOULDER REVISION Right 10/23/2021   Procedure: REVERSE SHOULDER REVISION;  Surgeon: Nicholes Stairs, MD;   Location: [facility];  Service: Orthopedics;  Laterality: Right;  180   Patient Active Problem List   Diagnosis Date Noted   Hx of sleep apnea 12/21/2021   S/P reverse total shoulder arthroplasty, right 10/23/2021   Cystocele with rectocele 04/10/2021   Mixed urinary incontinence due to female genital prolapse 04/10/2021   Submucous leiomyoma of uterus 04/10/2021   Thickened endometrium 04/10/2021   Pelvic pressure in female 02/11/2021   DJD (degenerative joint disease) of cervical spine 12/28/2020   Anxiety, generalized 12/28/2020   History of depression 12/28/2020   Memory difficulty 12/28/2020   Chronic venous insufficiency 12/24/2020   Lymphedema 12/24/2020   Diabetes ([place]) 12/24/2020   Hyperlipidemia 12/24/2020   Essential hypertension 12/24/2020   Closed fracture of neck of right scapula 10/17/2020   Status post reverse total shoulder replacement, right 09/02/2020   Rotator cuff arthropathy, right 07/14/2020    PCP: Adrian Prows  REFERRING PROVIDER: Dr. Victorino December ([facility])  REFERRING DIAG: s/p Right Reverse Total shoulder arthroplasty (REVISION) 10/23/2021  THERAPY DIAG:  Muscle weakness (generalized)  Right shoulder pain, unspecified chronicity  Limited range of motion (ROM) of shoulder  S/P reverse total shoulder arthroplasty, right  Rationale for Evaluation and Treatment Rehabilitation  ONSET DATE: 10/23/2021  SUBJECTIVE:  Reports having more pain in the neck and shoulders today (along UT distribution). Felt very sore after doing cable exercises last session.  Did not take her medication; also did not take her insulin or check her blood sugar. Reports of being asymptomatic with mild lightheadedness.  PERTINENT HISTORY: Angela Fernandez is a 76 y.o. female who complains of right shoulder pain and dysfunction following a elective reverse arthroplasty performed over in [place] a number of months ago.  She had early failure of the baseplate with loosening and  now malposition.  PAIN:  Are you having pain? no  PRECAUTIONS: Shoulder- in demographics section   PATIENT GOALS  I want to be able to reach the top of head and also paint as well  OBJECTIVE:   TODAY'S TREATMENT: 02/23/22 -Nustep WARM up/A/ROM x5 minutes -RUE flexion, ABDCT stretch 3x30sec on pulleys -Standing cable elbow extension 2x15 @ 2.5lb, 5.5lb  -standing elbow flexion 1x10 @ 3lb -Rt shoulder flexion 1x15 -rt shoulder ABDCT 1x15  -standing elbow flexion 1x10 @ 3lb -Rt shoulder flexion 1x15 -rt shoulder ABDCT 1x15  -wall pushup 1x13   PATIENT EDUCATION: Education details: Exercise technique; progression of HEP Person educated: Patient Education method: Explanation, Demonstration, Tactile cues, and Verbal cues Education comprehension: verbalized understanding, returned demonstration, verbal cues required, tactile cues required, and needs further education   HOME EXERCISE PROGRAM:  Access Code: [email or web address] URL: [email or web address] Date: 02/18/2022 Prepared by: Amalia Hailey  Exercises - Standing Shoulder Flexion with Resistance  - 1 x daily - 3 x weekly - 3 sets - 10 reps - Standing Single Arm Shoulder Abduction with Resistance  - 1 x daily - 3 x weekly - 3 sets - 10 reps - Shoulder External Rotation and Scapular Retraction  - 1 x daily - 3 x weekly - 3 sets - 10 reps - Prone Single Arm Shoulder Y  - 1 x daily - 3 x weekly - 3 sets - 10 reps - Prone Shoulder Horizontal Abduction  - 1 x daily - 3 x weekly - 3 sets - 10 reps - Prone Shoulder Extension - Single Arm  - 1 x daily - 3 x weekly - 3 sets - 10 reps - Prone Shoulder Row  - 1 x daily - 3 x weekly - 3 sets - 10 reps - Seated Upper Trapezius Stretch  - 1 x daily - 7 x weekly - 3 sets - 10 reps - 30 hold - Gentle Levator Scapulae Stretch  - 1 x daily - 7 x weekly - 3 sets - 10 reps - 30 hold   ASSESSMENT:  CLINICAL IMPRESSION: Continued with RUE strengthening and gross motor control. Limited  reps based on visual display of fatigue and change in motor patterns. Pain well controlled in session. Pt reports improved pain at end of session. Used a Geologist, engineering for bipofeed back in session.     OBJECTIVE IMPAIRMENTS decreased activity tolerance, decreased mobility, decreased ROM, decreased strength, decreased safety awareness, hypomobility, impaired flexibility, impaired UE functional use, postural dysfunction, and pain.   ACTIVITY LIMITATIONS carrying, lifting, sleeping, bathing, dressing, reach over head, and hygiene/grooming  PARTICIPATION LIMITATIONS: meal prep, cleaning, laundry, driving, shopping, community activity, and yard work  PERSONAL FACTORS Behavior pattern and 1-2 comorbidities: COPD, Mult shoulder surgeries  are also affecting patient's functional outcome.   REHAB POTENTIAL: Good  CLINICAL DECISION MAKING: Evolving/moderate complexity  EVALUATION COMPLEXITY: Low   GOALS: Goals reviewed with patient? Yes  SHORT TERM GOALS: Target date:  02/02/2022  (Remove Blue Hyperlink)  Pt will be independent with initial HEP in order to improve strength and decrease pain in order to improve pain-free function at home and work. Baseline: 12/04/2021- No formal HEP in place Goal status: INITIAL   LONG TERM GOALS: Target date: 03/16/2022  (Remove Blue Hyperlink)  Pt will be independent with Final HEP in order to improve strength and decrease pain in order to improve pain-free function at home and work. Baseline: 12/04/2021- No formal HEP in place Goal status: INITIAL  2.  Pt will decrease quick DASH score by at least 8% in order to demonstrate clinically significant reduction in disability. Baseline: 12/04/2021= 61; 9/14: 47.73 % Goal status: Goal Met  3.  Pt will improve FOTO to target score of 51% to display perceived improvements in ability to complete ADL's.  Baseline: 12/04/2021=40; 9/14: 54%  Goal status: Goal Met  4.  Pt will decrease worst pain as reported on NPRS by at  least 3 points in order to demonstrate clinically significant reduction in pain. Baseline: 12/04/2021= 6/10 Right shoulder pain; 9/14 - 4/10 worst pain over past week Goal status: On-Going  5.  Patient will demo > 140 deg of right shoulder elevation (PROM) for improved ROM and overhead activities Baseline: 12/04/2021=PROM R shoulder flex- 118; 105 ABD; 9/14 - R Shoulder  Goal status: INITIAL  6.  Patient will demo > 100 deg of right shoulder elevation (AROM) for improved ROM and overhead activities Baseline: 12/04/2021=PROM R shoulder flex- 118; 105 ABD; 9/14 - R Shoulder Flexion: 142 deg; Abduction: 121 deg. Goal status: Goal Met  7.  Patient will be able to mop 2 rooms at home without increase in pain to demonstrate increased strength without pain.   Baseline: 01/21/2022 - Pt states she is only able to mop 1 room without pain and any more it jumps up to 4/10. Goal status: New   PLAN: PT FREQUENCY: 2x/week  PT DURATION: 12 weeks  PLANNED INTERVENTIONS: Therapeutic exercises, Therapeutic activity, Patient/Family education, Self Care, Joint mobilization, Dry Needling, Spinal manipulation, Spinal mobilization, Cryotherapy, Moist heat, scar mobilization, Taping, Ultrasound, and Manual therapy  PLAN FOR NEXT SESSION: RUE strengthening  10:51 AM, 02/23/22 Etta Grandchild, PT, DPT Physical Therapist - [facility]  [phone] [facility])

## 2022-02-25 ENCOUNTER — Ambulatory Visit: Payer: Medicare Other

## 2022-02-25 DIAGNOSIS — M6281 Muscle weakness (generalized): Secondary | ICD-10-CM | POA: Diagnosis not present

## 2022-02-25 DIAGNOSIS — R262 Difficulty in walking, not elsewhere classified: Secondary | ICD-10-CM | POA: Diagnosis not present

## 2022-02-25 DIAGNOSIS — M25511 Pain in right shoulder: Secondary | ICD-10-CM

## 2022-02-25 DIAGNOSIS — Z96611 Presence of right artificial shoulder joint: Secondary | ICD-10-CM

## 2022-02-25 DIAGNOSIS — M25619 Stiffness of unspecified shoulder, not elsewhere classified: Secondary | ICD-10-CM

## 2022-02-25 NOTE — Therapy (Signed)
OUTPATIENT PHYSICAL THERAPY TREATMENT/RECERTIFICATION VISIT/Physical Therapy Progress Note   Dates of reporting period  01/19/2022   to   02/25/2022   Patient Name: Angela Fernandez MRN: [id number] DOB:09-May-1946, 77 y.o., female Today's Date: 02/26/2022   PT End of Session - 02/25/22 1135     Visit Number 20    Number of Visits 25    Date for PT Re-Evaluation 05/20/22    Authorization Type UHC Medicare    Authorization Time Period 12/04/2021- 02/26/2022; 10/19- 05/20/2022    Progress Note Due on Visit 30    PT Start Time 1130    PT Stop Time 1215    PT Time Calculation (min) 45 min    Activity Tolerance Patient tolerated treatment well;No increased pain    Behavior During Therapy WFL for tasks assessed/performed               Past Medical History:  Diagnosis Date   Anemia    Arthritis    Bipolar affect, depressed ([place])    COPD (chronic obstructive pulmonary disease) (HCC)    Diabetes mellitus without complication (HCC)    type II   GERD (gastroesophageal reflux disease)    Hyperlipidemia    Hypertension    Hypothyroidism    PTSD (post-traumatic stress disorder)    Schizoaffective disorder ([place])    Sleep apnea    cpap   Stroke ([place])    hx of  mini stroke - 2001   TIA (transient ischemic attack) 2010   UTI (urinary tract infection)    Past Surgical History:  Procedure Laterality Date   BILATERAL CARPAL TUNNEL RELEASE Bilateral    BREAST BIOPSY     BREAST SURGERY Right    lumpectomy   CATARACT EXTRACTION W/ INTRAOCULAR LENS  IMPLANT, BILATERAL Bilateral    CHOLECYSTECTOMY     COLONOSCOPY     ESOPHAGOGASTRODUODENOSCOPY     EYE SURGERY     JOINT REPLACEMENT     NASAL SINUS SURGERY     REPLACEMENT TOTAL KNEE BILATERAL Bilateral    REVERSE SHOULDER ARTHROPLASTY Right 09/02/2020   Procedure: REVERSE SHOULDER ARTHROPLASTY;  Surgeon: Corky Mull, MD;  Location: [facility];  Service: Orthopedics;  Laterality: Right;   SHOULDER SURGERY Right 2020   rotator cuff  repair   TOTAL SHOULDER REVISION Right 10/23/2021   Procedure: REVERSE SHOULDER REVISION;  Surgeon: Nicholes Stairs, MD;  Location: [facility];  Service: Orthopedics;  Laterality: Right;  180   Patient Active Problem List   Diagnosis Date Noted   Hx of sleep apnea 12/21/2021   S/P reverse total shoulder arthroplasty, right 10/23/2021   Cystocele with rectocele 04/10/2021   Mixed urinary incontinence due to female genital prolapse 04/10/2021   Submucous leiomyoma of uterus 04/10/2021   Thickened endometrium 04/10/2021   Pelvic pressure in female 02/11/2021   DJD (degenerative joint disease) of cervical spine 12/28/2020   Anxiety, generalized 12/28/2020   History of depression 12/28/2020   Memory difficulty 12/28/2020   Chronic venous insufficiency 12/24/2020   Lymphedema 12/24/2020   Diabetes ([place]) 12/24/2020   Hyperlipidemia 12/24/2020   Essential hypertension 12/24/2020   Closed fracture of neck of right scapula 10/17/2020   Status post reverse total shoulder replacement, right 09/02/2020   Rotator cuff arthropathy, right 07/14/2020    PCP: Adrian Prows  REFERRING PROVIDER: Dr. Victorino December ([facility])  REFERRING DIAG: s/p Right Reverse Total shoulder arthroplasty (REVISION) 10/23/2021  THERAPY DIAG:  Muscle weakness (generalized)  Right shoulder pain, unspecified chronicity  Limited  range of motion (ROM) of shoulder  S/P reverse total shoulder arthroplasty, right  Rationale for Evaluation and Treatment Rehabilitation  ONSET DATE: 10/23/2021  SUBJECTIVE:  Reports continued cervical soreness yet no particular right shoulder pain and states doing well- has returned to vacuuming her home and performing well. She states some trouble reaching back of head or reaching shoulder height - like placing dishes in cabinet.   PERTINENT HISTORY: Angela Fernandez is a 76 y.o. female who complains of right shoulder pain and dysfunction following a elective reverse arthroplasty  performed over in [place] a number of months ago.  She had early failure of the baseplate with loosening and now malposition.  PAIN:  Are you having pain? no  PRECAUTIONS: Shoulder- in demographics section   PATIENT GOALS  I want to be able to reach the top of head and also paint as well  OBJECTIVE:   TODAY'S TREATMENT: 02/26/22 -SciFit/AROM x6 minutes (3 min forward/3 min backward)  -RUE flexion, ABDCT AAroM with pvc pipe 2 x20 reps  -Standing cable elbow extension 2x15 @ 2.5lb, 2nd sets 7.5lb  -Standing cable elbow flex 2 x 15 reps @ 2.5 lb -Standing cable - scap retraction 2 x 15 reps @ 2.5 lb -Standing cable - shoulder ext 2 x 15 reps @ 2.5 lb-Rt shoulder flexion 1x15 -rt shoulder ABDCT 1x15  -standing elbow flexion 1x10 @ 3lb -Rt shoulder flexion 1x15 -rt shoulder ABDCT 1x15  -wall pushup 1x13  Manual Therapy:   PROM to right shoulder - flex/abd/ER/IR for several minutes GH joint mobs- Inf/PA/AP- 30 bouts x 3 each (patient denied any pain- most restricted with inf glide with flex/abd mobilization)  Inf distraction- long axis with oscillation x 20 (patient reports feels good)   Reassessed goals for recert/Progress note #93 visit- See goal section for today's details.    PATIENT EDUCATION: Education details: Exercise technique; progression of HEP Person educated: Patient Education method: Explanation, Demonstration, Tactile cues, and Verbal cues Education comprehension: verbalized understanding, returned demonstration, verbal cues required, tactile cues required, and needs further education   HOME EXERCISE PROGRAM:  Access Code: [email or web address] URL: [email or web address] Date: 02/18/2022 Prepared by: Amalia Hailey  Exercises - Standing Shoulder Flexion with Resistance  - 1 x daily - 3 x weekly - 3 sets - 10 reps - Standing Single Arm Shoulder Abduction with Resistance  - 1 x daily - 3 x weekly - 3 sets - 10 reps - Shoulder External Rotation and Scapular  Retraction  - 1 x daily - 3 x weekly - 3 sets - 10 reps - Prone Single Arm Shoulder Y  - 1 x daily - 3 x weekly - 3 sets - 10 reps - Prone Shoulder Horizontal Abduction  - 1 x daily - 3 x weekly - 3 sets - 10 reps - Prone Shoulder Extension - Single Arm  - 1 x daily - 3 x weekly - 3 sets - 10 reps - Prone Shoulder Row  - 1 x daily - 3 x weekly - 3 sets - 10 reps - Seated Upper Trapezius Stretch  - 1 x daily - 7 x weekly - 3 sets - 10 reps - 30 hold - Gentle Levator Scapulae Stretch  - 1 x daily - 7 x weekly - 3 sets - 10 reps - 30 hold   ASSESSMENT:  CLINICAL IMPRESSION: Patient continues to progress well overall in her shoulder rehab process. She has minimal to no right shoulder pain but does endorse some cervical pain. She is progressing  well with her ROM - still limited and some upper trap compensation but overall progressing with functional use. She will continue to benefit from skilled PT to improve her functional use of left shoulder including overhead motion as much as possible and Right shoulder strengthening for general UE use. Patient's condition has the potential to improve in response to therapy. Maximum improvement is yet to be obtained. The anticipated improvement is attainable and reasonable in a generally predictable time.        OBJECTIVE IMPAIRMENTS decreased activity tolerance, decreased mobility, decreased ROM, decreased strength, decreased safety awareness, hypomobility, impaired flexibility, impaired UE functional use, postural dysfunction, and pain.   ACTIVITY LIMITATIONS carrying, lifting, sleeping, bathing, dressing, reach over head, and hygiene/grooming  PARTICIPATION LIMITATIONS: meal prep, cleaning, laundry, driving, shopping, community activity, and yard work  PERSONAL FACTORS Behavior pattern and 1-2 comorbidities: COPD, Mult shoulder surgeries  are also affecting patient's functional outcome.   REHAB POTENTIAL: Good  CLINICAL DECISION MAKING: Evolving/moderate  complexity  EVALUATION COMPLEXITY: Low   GOALS: Goals reviewed with patient? Yes  SHORT TERM GOALS: Target date:   Pt will be independent with initial HEP in order to improve strength and decrease pain in order to improve pain-free function at home and work. Baseline: 12/04/2021- No formal HEP in place; 02/25/2022- Patient reports doing her HEP currently everyday.  Goal status: GOAL MET   LONG TERM GOALS: Target date: 05/20/2022  Pt will be independent with Final HEP in order to improve strength and decrease pain in order to improve pain-free function at home and work. Baseline: 12/04/2021- No formal HEP in place; 02/25/2022- Patient reports doing her HEP currently everyday.  Goal status: ONGOING  2.  Pt will decrease quick DASH score by at least 8% in order to demonstrate clinically significant reduction in disability. Baseline: 12/04/2021= 61; 9/14: 47.73 % Goal status: Goal Met  3.  Pt will improve FOTO to target score of 51% to display perceived improvements in ability to complete ADL's.  Baseline: 12/04/2021=40; 9/14: 54%  Goal status: Goal Met  4.  Pt will decrease worst pain as reported on NPRS by at least 3 points in order to demonstrate clinically significant reduction in pain. Baseline: 12/04/2021= 6/10 Right shoulder pain; 9/14 - 4/10 worst pain over past week; 02/25/2022= 3-4/10 right shoulder pain- Pain management includes ROM/stretching, ice,  Goal status: On-Going  5.  Patient will demo > 140 deg of right shoulder elevation (PROM) for improved ROM and overhead activities Baseline: 12/04/2021=PROM R shoulder flex- 118; 105 ABD; 9/14 - R Shoulder; 10 /19/2023= flex- 162 and ABD= 158 Goal status: MET  6.  Patient will demo > 100 deg of right shoulder elevation (AROM) for improved ROM and overhead activities Baseline: 12/04/2021=PROM R shoulder flex- 118; 105 ABD; 9/14 - R Shoulder Flexion: 142 deg; Abduction: 121 deg. Goal status: Goal Met  7.  Patient will be able to mop  2 rooms at home without increase in pain to demonstrate increased strength without pain.   Baseline: 01/21/2022 - Pt states she is only able to mop 1 room without pain and any more it jumps up to 4/10.; 02/25/2022= Patient able to mop multiple rooms without difficulty.  Goal status: GOAL MET  8.  Patient will demo > 150 deg of right shoulder elevation (AROM) for improved ROM and overhead activities Baseline: 02/25/2022- Right shoulder flex= 130 deg without UT compensation; Abd= 125 deg Goal status: Initial  9. Patient will transition from skilled PT setting for shoulder rehab  to home or gym based program to promote more independence and plan for future.   Baseline: Patient continues with outpatient PT requiring VC and assist with knowlegdge base and execution of prescribed exercises.  GOAL status: Initial    PLAN: PT FREQUENCY: 1-2x/week  PT DURATION: 12 weeks  PLANNED INTERVENTIONS: Therapeutic exercises, Therapeutic activity, Patient/Family education, Self Care, Joint mobilization, Dry Needling, Spinal manipulation, Spinal mobilization, Cryotherapy, Moist heat, scar mobilization, Taping, Ultrasound, and Manual therapy  PLAN FOR NEXT SESSION: RUE strengthening - resistive/AROM   7:40 AM, 02/26/22 Ollen Bowl, PT Physical Therapist - [facility]  [phone] [facility])

## 2022-03-02 ENCOUNTER — Ambulatory Visit: Payer: Medicare Other

## 2022-03-04 ENCOUNTER — Ambulatory Visit: Payer: Medicare Other

## 2022-03-04 DIAGNOSIS — M25619 Stiffness of unspecified shoulder, not elsewhere classified: Secondary | ICD-10-CM

## 2022-03-04 DIAGNOSIS — R262 Difficulty in walking, not elsewhere classified: Secondary | ICD-10-CM

## 2022-03-04 DIAGNOSIS — M6281 Muscle weakness (generalized): Secondary | ICD-10-CM | POA: Diagnosis not present

## 2022-03-04 DIAGNOSIS — Z96611 Presence of right artificial shoulder joint: Secondary | ICD-10-CM | POA: Diagnosis not present

## 2022-03-04 DIAGNOSIS — M25511 Pain in right shoulder: Secondary | ICD-10-CM | POA: Diagnosis not present

## 2022-03-04 NOTE — Therapy (Signed)
OUTPATIENT PHYSICAL THERAPY TREATMENT   Patient Name: Angela Fernandez MRN: [id number] DOB:[birth year], 76 y.o., female Today's Date: 03/04/2022   PT End of Session - 03/04/22 1106     Visit Number 21    Number of Visits 25    Date for PT Re-Evaluation 05/20/22    Authorization Type [employer] Medicare    Authorization Time Period 12/04/2021- 02/26/2022; 10/19- 05/20/2022    Progress Note Due on Visit 30    PT Start Time 1102    PT Stop Time 1145    PT Time Calculation (min) 43 min    Activity Tolerance Patient tolerated treatment well;No increased pain    Behavior During Therapy WFL for tasks assessed/performed               Past Medical History:  Diagnosis Date   Anemia    Arthritis    Bipolar affect, depressed ([place])    COPD (chronic obstructive pulmonary disease) (HCC)    Diabetes mellitus without complication (HCC)    type II   GERD (gastroesophageal reflux disease)    Hyperlipidemia    Hypertension    Hypothyroidism    PTSD (post-traumatic stress disorder)    Schizoaffective disorder ([place])    Sleep apnea    cpap   Stroke ([place])    hx of  mini stroke - 2001   TIA (transient ischemic attack) 2010   UTI (urinary tract infection)    Past Surgical History:  Procedure Laterality Date   BILATERAL CARPAL TUNNEL RELEASE Bilateral    BREAST BIOPSY     BREAST SURGERY Right    lumpectomy   CATARACT EXTRACTION W/ INTRAOCULAR LENS  IMPLANT, BILATERAL Bilateral    CHOLECYSTECTOMY     COLONOSCOPY     ESOPHAGOGASTRODUODENOSCOPY     EYE SURGERY     JOINT REPLACEMENT     NASAL SINUS SURGERY     REPLACEMENT TOTAL KNEE BILATERAL Bilateral    REVERSE SHOULDER ARTHROPLASTY Right 09/02/2020   Procedure: REVERSE SHOULDER ARTHROPLASTY;  Surgeon: Corky Mull, MD;  Location: [facility] ORS;  Service: Orthopedics;  Laterality: Right;   SHOULDER SURGERY Right 2020   rotator cuff repair   TOTAL SHOULDER REVISION Right 10/23/2021   Procedure: REVERSE SHOULDER REVISION;  Surgeon: Nicholes Stairs, MD;  Location: [facility];  Service: Orthopedics;  Laterality: Right;  180   Patient Active Problem List   Diagnosis Date Noted   Hx of sleep apnea 12/21/2021   S/P reverse total shoulder arthroplasty, right 10/23/2021   Cystocele with rectocele 04/10/2021   Mixed urinary incontinence due to female genital prolapse 04/10/2021   Submucous leiomyoma of uterus 04/10/2021   Thickened endometrium 04/10/2021   Pelvic pressure in female 02/11/2021   DJD (degenerative joint disease) of cervical spine 12/28/2020   Anxiety, generalized 12/28/2020   History of depression 12/28/2020   Memory difficulty 12/28/2020   Chronic venous insufficiency 12/24/2020   Lymphedema 12/24/2020   Diabetes ([place]) 12/24/2020   Hyperlipidemia 12/24/2020   Essential hypertension 12/24/2020   Closed fracture of neck of right scapula 10/17/2020   Status post reverse total shoulder replacement, right 09/02/2020   Rotator cuff arthropathy, right 07/14/2020    PCP: Adrian Prows  REFERRING PROVIDER: Dr. Victorino December ([facility])  REFERRING DIAG: s/p Right Reverse Total shoulder arthroplasty (REVISION) 10/23/2021  THERAPY DIAG:  Difficulty in walking, not elsewhere classified  Muscle weakness (generalized)  Right shoulder pain, unspecified chronicity  Limited range of motion (ROM) of shoulder  S/P reverse total shoulder  arthroplasty, right  Rationale for Evaluation and Treatment Rehabilitation  ONSET DATE: 10/23/2021  SUBJECTIVE:  Pt reports she is doing well today.  She notes that she was feeling pretty bad the past couple of days after receiving both her Covid and Flu vaccine on Monday.  PERTINENT HISTORY: Angela Fernandez is a 76 y.o. female who complains of right shoulder pain and dysfunction following a elective reverse arthroplasty performed over in [place] a number of months ago.  She had early failure of the baseplate with loosening and now malposition.  PAIN:  Are you having pain?  no  PRECAUTIONS: Shoulder- in demographics section   PATIENT GOALS  I want to be able to reach the top of head and also paint as well  OBJECTIVE:   TODAY'S TREATMENT: 03/04/22  TherEx:  SciFit ergometer, Seat 7, Level 4, x6 minutes (3 min forward/3 min backward)  Supine RUE flexion, abduction, AAROM with pvc pipe 2 x20 reps  Standing RUE flexion, abduction, AAROM with pvc pipe 2 x20 reps  Standing cable elbow extension 2x15, 7.5# Standing cable elbow flexion/biceps curl, 2x15, 7.5# Standing cable scapular retraction, 2x15 reps, 7.5# Wall pushup 2x10 Wall Angels, 2x10  Manual Therapy:   Supine shoulder mobilizations, Grades II-III for reduction in pain and improve ROM, inferior/AP/PA glides for increased functional ROM, 30 sec bouts each direction Supine inferior long axis distraction, with oscillation, 30 sec bouts with pt reporting relief      PATIENT EDUCATION: Education details: Exercise technique; progression of HEP Person educated: Patient Education method: Explanation, Demonstration, Tactile cues, and Verbal cues Education comprehension: verbalized understanding, returned demonstration, verbal cues required, tactile cues required, and needs further education   HOME EXERCISE PROGRAM:  Access Code: [email or web address] URL: [email or web address] Date: 02/18/2022 Prepared by: Amalia Hailey  Exercises - Standing Shoulder Flexion with Resistance  - 1 x daily - 3 x weekly - 3 sets - 10 reps - Standing Single Arm Shoulder Abduction with Resistance  - 1 x daily - 3 x weekly - 3 sets - 10 reps - Shoulder External Rotation and Scapular Retraction  - 1 x daily - 3 x weekly - 3 sets - 10 reps - Prone Single Arm Shoulder Y  - 1 x daily - 3 x weekly - 3 sets - 10 reps - Prone Shoulder Horizontal Abduction  - 1 x daily - 3 x weekly - 3 sets - 10 reps - Prone Shoulder Extension - Single Arm  - 1 x daily - 3 x weekly - 3 sets - 10 reps - Prone Shoulder Row  - 1 x daily - 3 x  weekly - 3 sets - 10 reps - Seated Upper Trapezius Stretch  - 1 x daily - 7 x weekly - 3 sets - 10 reps - 30 hold - Gentle Levator Scapulae Stretch  - 1 x daily - 7 x weekly - 3 sets - 10 reps - 30 hold   ASSESSMENT:  CLINICAL IMPRESSION:  Pt continues to making progress towards her goals at this time.  Pt denies pain in the shoulder with the exercises given at this time and is able to perform ROM [facility].  Pt does still have some reaching deficits that are present currently, and will continue to benefit from future sessions to address the ROM deficits and improve strength in those ranges.   Pt will continue to benefit from skilled therapy to address remaining deficits in order to improve overall QoL and return to PLOF.      OBJECTIVE  IMPAIRMENTS decreased activity tolerance, decreased mobility, decreased ROM, decreased strength, decreased safety awareness, hypomobility, impaired flexibility, impaired UE functional use, postural dysfunction, and pain.   ACTIVITY LIMITATIONS carrying, lifting, sleeping, bathing, dressing, reach over head, and hygiene/grooming  PARTICIPATION LIMITATIONS: meal prep, cleaning, laundry, driving, shopping, community activity, and yard work  PERSONAL FACTORS Behavior pattern and 1-2 comorbidities: COPD, Mult shoulder surgeries  are also affecting patient's functional outcome.   REHAB POTENTIAL: Good  CLINICAL DECISION MAKING: Evolving/moderate complexity  EVALUATION COMPLEXITY: Low   GOALS: Goals reviewed with patient? Yes  SHORT TERM GOALS: Target date:   Pt will be independent with initial HEP in order to improve strength and decrease pain in order to improve pain-free function at home and work. Baseline: 12/04/2021- No formal HEP in place; 02/25/2022- Patient reports doing her HEP currently everyday.  Goal status: GOAL MET   LONG TERM GOALS: Target date: 05/20/2022  Pt will be independent with Final HEP in order to improve strength and decrease pain in  order to improve pain-free function at home and work. Baseline: 12/04/2021- No formal HEP in place; 02/25/2022- Patient reports doing her HEP currently everyday.  Goal status: ONGOING  2.  Pt will decrease quick DASH score by at least 8% in order to demonstrate clinically significant reduction in disability. Baseline: 12/04/2021= 61; 9/14: 47.73 % Goal status: Goal Met  3.  Pt will improve FOTO to target score of 51% to display perceived improvements in ability to complete ADL's.  Baseline: 12/04/2021=40; 9/14: 54%  Goal status: Goal Met  4.  Pt will decrease worst pain as reported on NPRS by at least 3 points in order to demonstrate clinically significant reduction in pain. Baseline: 12/04/2021= 6/10 Right shoulder pain; 9/14 - 4/10 worst pain over past week; 02/25/2022= 3-4/10 right shoulder pain- Pain management includes ROM/stretching, ice,  Goal status: On-Going  5.  Patient will demo > 140 deg of right shoulder elevation (PROM) for improved ROM and overhead activities Baseline: 12/04/2021=PROM R shoulder flex- 118; 105 ABD; 9/14 - R Shoulder; 10 /19/2023= flex- 162 and ABD= 158 Goal status: MET  6.  Patient will demo > 100 deg of right shoulder elevation (AROM) for improved ROM and overhead activities Baseline: 12/04/2021=PROM R shoulder flex- 118; 105 ABD; 9/14 - R Shoulder Flexion: 142 deg; Abduction: 121 deg. Goal status: Goal Met  7.  Patient will be able to mop 2 rooms at home without increase in pain to demonstrate increased strength without pain.   Baseline: 01/21/2022 - Pt states she is only able to mop 1 room without pain and any more it jumps up to 4/10.; 02/25/2022= Patient able to mop multiple rooms without difficulty.  Goal status: GOAL MET  8.  Patient will demo > 150 deg of right shoulder elevation (AROM) for improved ROM and overhead activities Baseline: 02/25/2022- Right shoulder flex= 130 deg without UT compensation; Abd= 125 deg Goal status: Initial  9. Patient will  transition from skilled PT setting for shoulder rehab to home or gym based program to promote more independence and plan for future.   Baseline: Patient continues with outpatient PT requiring VC and assist with knowlegdge base and execution of prescribed exercises.  GOAL status: Initial    PLAN: PT FREQUENCY: 1-2x/week  PT DURATION: 12 weeks  PLANNED INTERVENTIONS: Therapeutic exercises, Therapeutic activity, Patient/Family education, Self Care, Joint mobilization, Dry Needling, Spinal manipulation, Spinal mobilization, Cryotherapy, Moist heat, scar mobilization, Taping, Ultrasound, and Manual therapy  PLAN FOR NEXT SESSION: RUE strengthening -  resistive/AROM    Gwenlyn Saran, PT, DPT Physical Therapist- [facility]  03/04/22, 5:16 PM

## 2022-03-07 ENCOUNTER — Other Ambulatory Visit: Payer: Self-pay | Admitting: Psychiatry

## 2022-03-08 ENCOUNTER — Encounter (INDEPENDENT_AMBULATORY_CARE_PROVIDER_SITE_OTHER): Payer: Self-pay

## 2022-03-08 NOTE — Therapy (Signed)
OUTPATIENT PHYSICAL THERAPY TREATMENT   Patient Name: Angela Fernandez MRN: [id number] DOB:1946/01/06, 76 y.o., female Today's Date: 03/09/2022   PT End of Session - 03/09/22 0849     Visit Number 22    Number of Visits 44    Date for PT Re-Evaluation 05/20/22    Authorization Type UHC Medicare    Authorization Time Period 12/04/2021- 02/26/2022; 10/19- 05/20/2022    Progress Note Due on Visit 30    PT Start Time 0847    PT Stop Time 0926    PT Time Calculation (min) 39 min    Activity Tolerance Patient tolerated treatment well;No increased pain    Behavior During Therapy WFL for tasks assessed/performed                Past Medical History:  Diagnosis Date   Anemia    Arthritis    Bipolar affect, depressed ([place])    COPD (chronic obstructive pulmonary disease) (HCC)    Diabetes mellitus without complication (HCC)    type II   GERD (gastroesophageal reflux disease)    Hyperlipidemia    Hypertension    Hypothyroidism    PTSD (post-traumatic stress disorder)    Schizoaffective disorder ([place])    Sleep apnea    cpap   Stroke ([place])    hx of  mini stroke - 2001   TIA (transient ischemic attack) 2010   UTI (urinary tract infection)    Past Surgical History:  Procedure Laterality Date   BILATERAL CARPAL TUNNEL RELEASE Bilateral    BREAST BIOPSY     BREAST SURGERY Right    lumpectomy   CATARACT EXTRACTION W/ INTRAOCULAR LENS  IMPLANT, BILATERAL Bilateral    CHOLECYSTECTOMY     COLONOSCOPY     ESOPHAGOGASTRODUODENOSCOPY     EYE SURGERY     JOINT REPLACEMENT     NASAL SINUS SURGERY     REPLACEMENT TOTAL KNEE BILATERAL Bilateral    REVERSE SHOULDER ARTHROPLASTY Right 09/02/2020   Procedure: REVERSE SHOULDER ARTHROPLASTY;  Surgeon: Corky Mull, MD;  Location: [facility] ORS;  Service: Orthopedics;  Laterality: Right;   SHOULDER SURGERY Right 2020   rotator cuff repair   TOTAL SHOULDER REVISION Right 10/23/2021   Procedure: REVERSE SHOULDER REVISION;  Surgeon: Nicholes Stairs, MD;  Location: [facility];  Service: Orthopedics;  Laterality: Right;  180   Patient Active Problem List   Diagnosis Date Noted   Hx of sleep apnea 12/21/2021   S/P reverse total shoulder arthroplasty, right 10/23/2021   Cystocele with rectocele 04/10/2021   Mixed urinary incontinence due to female genital prolapse 04/10/2021   Submucous leiomyoma of uterus 04/10/2021   Thickened endometrium 04/10/2021   Pelvic pressure in female 02/11/2021   DJD (degenerative joint disease) of cervical spine 12/28/2020   Anxiety, generalized 12/28/2020   History of depression 12/28/2020   Memory difficulty 12/28/2020   Chronic venous insufficiency 12/24/2020   Lymphedema 12/24/2020   Diabetes ([place]) 12/24/2020   Hyperlipidemia 12/24/2020   Essential hypertension 12/24/2020   Closed fracture of neck of right scapula 10/17/2020   Status post reverse total shoulder replacement, right 09/02/2020   Rotator cuff arthropathy, right 07/14/2020    PCP: Adrian Prows  REFERRING PROVIDER: Dr. Victorino December ([facility])  REFERRING DIAG: s/p Right Reverse Total shoulder arthroplasty (REVISION) 10/23/2021  THERAPY DIAG:  Muscle weakness (generalized)  Right shoulder pain, unspecified chronicity  Limited range of motion (ROM) of shoulder  S/P reverse total shoulder arthroplasty, right  Rationale for Evaluation  and Treatment Rehabilitation  ONSET DATE: 10/23/2021  SUBJECTIVE:  Pt reports no new issues and states doing well overall without any complaint of R shoulder pain  PERTINENT HISTORY: Angela Fernandez is a 76 y.o. female who complains of right shoulder pain and dysfunction following a elective reverse arthroplasty performed over in [place] a number of months ago.  She had early failure of the baseplate with loosening and now malposition.  PAIN:  Are you having pain? no  PRECAUTIONS: Shoulder- in demographics section   PATIENT GOALS  I want to be able to reach the top of head  and also paint as well  OBJECTIVE:   TODAY'S TREATMENT: 03/09/22  TherEx:  Standing cable elbow extension 2x15, 7.5# Standing cable elbow flexion/biceps curl, 2x15, 7.5# Standing cable scapular retraction, 2x15 reps, 7.5# Standing AROM with shoulder flex/abd/ext x 15 reps each Standing resistive deltoid strengthening - ant/middle/posterior with 2# dumbbell  2 sets of 12 reps Standing - trying to ER to reach back behind head x 10 reps Wall pushup 2x10 Wall Angels, 2x10 Prone - Shoulder flex/Horizontal ABD/Ext - 2 sets of 12 reps      PATIENT EDUCATION: Education details: Exercise technique; progression of HEP Person educated: Patient Education method: Explanation, Demonstration, Tactile cues, and Verbal cues Education comprehension: verbalized understanding, returned demonstration, verbal cues required, tactile cues required, and needs further education   HOME EXERCISE PROGRAM:  Access Code: [email or web address] URL: [email or web address] Date: 02/18/2022 Prepared by: Amalia Hailey  Exercises - Standing Shoulder Flexion with Resistance  - 1 x daily - 3 x weekly - 3 sets - 10 reps - Standing Single Arm Shoulder Abduction with Resistance  - 1 x daily - 3 x weekly - 3 sets - 10 reps - Shoulder External Rotation and Scapular Retraction  - 1 x daily - 3 x weekly - 3 sets - 10 reps - Prone Single Arm Shoulder Y  - 1 x daily - 3 x weekly - 3 sets - 10 reps - Prone Shoulder Horizontal Abduction  - 1 x daily - 3 x weekly - 3 sets - 10 reps - Prone Shoulder Extension - Single Arm  - 1 x daily - 3 x weekly - 3 sets - 10 reps - Prone Shoulder Row  - 1 x daily - 3 x weekly - 3 sets - 10 reps - Seated Upper Trapezius Stretch  - 1 x daily - 7 x weekly - 3 sets - 10 reps - 30 hold - Gentle Levator Scapulae Stretch  - 1 x daily - 7 x weekly - 3 sets - 10 reps - 30 hold   ASSESSMENT:  CLINICAL IMPRESSION:  Patient continues to progress as seen today with more active and resistive  exercises. She is still very limited with overhead mobility and will continue to benefit from AROM/Resistive training.  Pt will continue to benefit from skilled therapy to address remaining deficits in order to improve overall QoL and return to PLOF.      OBJECTIVE IMPAIRMENTS decreased activity tolerance, decreased mobility, decreased ROM, decreased strength, decreased safety awareness, hypomobility, impaired flexibility, impaired UE functional use, postural dysfunction, and pain.   ACTIVITY LIMITATIONS carrying, lifting, sleeping, bathing, dressing, reach over head, and hygiene/grooming  PARTICIPATION LIMITATIONS: meal prep, cleaning, laundry, driving, shopping, community activity, and yard work  PERSONAL FACTORS Behavior pattern and 1-2 comorbidities: COPD, Mult shoulder surgeries  are also affecting patient's functional outcome.   REHAB POTENTIAL: Good  CLINICAL DECISION MAKING: Evolving/moderate complexity  EVALUATION COMPLEXITY: Low  GOALS: Goals reviewed with patient? Yes  SHORT TERM GOALS: Target date:   Pt will be independent with initial HEP in order to improve strength and decrease pain in order to improve pain-free function at home and work. Baseline: 12/04/2021- No formal HEP in place; 02/25/2022- Patient reports doing her HEP currently everyday.  Goal status: GOAL MET   LONG TERM GOALS: Target date: 05/20/2022  Pt will be independent with Final HEP in order to improve strength and decrease pain in order to improve pain-free function at home and work. Baseline: 12/04/2021- No formal HEP in place; 02/25/2022- Patient reports doing her HEP currently everyday.  Goal status: ONGOING  2.  Pt will decrease quick DASH score by at least 8% in order to demonstrate clinically significant reduction in disability. Baseline: 12/04/2021= 61; 9/14: 47.73 % Goal status: Goal Met  3.  Pt will improve FOTO to target score of 51% to display perceived improvements in ability to complete  ADL's.  Baseline: 12/04/2021=40; 9/14: 54%  Goal status: Goal Met  4.  Pt will decrease worst pain as reported on NPRS by at least 3 points in order to demonstrate clinically significant reduction in pain. Baseline: 12/04/2021= 6/10 Right shoulder pain; 9/14 - 4/10 worst pain over past week; 02/25/2022= 3-4/10 right shoulder pain- Pain management includes ROM/stretching, ice,  Goal status: On-Going  5.  Patient will demo > 140 deg of right shoulder elevation (PROM) for improved ROM and overhead activities Baseline: 12/04/2021=PROM R shoulder flex- 118; 105 ABD; 9/14 - R Shoulder; 10 /19/2023= flex- 162 and ABD= 158 Goal status: MET  6.  Patient will demo > 100 deg of right shoulder elevation (AROM) for improved ROM and overhead activities Baseline: 12/04/2021=PROM R shoulder flex- 118; 105 ABD; 9/14 - R Shoulder Flexion: 142 deg; Abduction: 121 deg. Goal status: Goal Met  7.  Patient will be able to mop 2 rooms at home without increase in pain to demonstrate increased strength without pain.   Baseline: 01/21/2022 - Pt states she is only able to mop 1 room without pain and any more it jumps up to 4/10.; 02/25/2022= Patient able to mop multiple rooms without difficulty.  Goal status: GOAL MET  8.  Patient will demo > 150 deg of right shoulder elevation (AROM) for improved ROM and overhead activities Baseline: 02/25/2022- Right shoulder flex= 130 deg without UT compensation; Abd= 125 deg Goal status: Initial  9. Patient will transition from skilled PT setting for shoulder rehab to home or gym based program to promote more independence and plan for future.   Baseline: Patient continues with outpatient PT requiring VC and assist with knowlegdge base and execution of prescribed exercises.  GOAL status: Initial    PLAN: PT FREQUENCY: 1-2x/week  PT DURATION: 12 weeks  PLANNED INTERVENTIONS: Therapeutic exercises, Therapeutic activity, Patient/Family education, Self Care, Joint mobilization, Dry  Needling, Spinal manipulation, Spinal mobilization, Cryotherapy, Moist heat, scar mobilization, Taping, Ultrasound, and Manual therapy  PLAN FOR NEXT SESSION: RUE strengthening - resistive/AROM    Ollen Bowl, PT Physical Therapist- [facility]  03/09/22, 5:29 PM

## 2022-03-09 ENCOUNTER — Ambulatory Visit: Payer: Medicare Other

## 2022-03-09 DIAGNOSIS — Z96611 Presence of right artificial shoulder joint: Secondary | ICD-10-CM

## 2022-03-09 DIAGNOSIS — M6281 Muscle weakness (generalized): Secondary | ICD-10-CM

## 2022-03-09 DIAGNOSIS — M25619 Stiffness of unspecified shoulder, not elsewhere classified: Secondary | ICD-10-CM | POA: Diagnosis not present

## 2022-03-09 DIAGNOSIS — M25511 Pain in right shoulder: Secondary | ICD-10-CM | POA: Diagnosis not present

## 2022-03-09 DIAGNOSIS — R262 Difficulty in walking, not elsewhere classified: Secondary | ICD-10-CM | POA: Diagnosis not present

## 2022-03-11 ENCOUNTER — Ambulatory Visit: Payer: Medicare Other | Attending: Psychiatry

## 2022-03-11 DIAGNOSIS — R262 Difficulty in walking, not elsewhere classified: Secondary | ICD-10-CM | POA: Diagnosis not present

## 2022-03-11 DIAGNOSIS — Z96611 Presence of right artificial shoulder joint: Secondary | ICD-10-CM | POA: Insufficient documentation

## 2022-03-11 DIAGNOSIS — M25511 Pain in right shoulder: Secondary | ICD-10-CM | POA: Insufficient documentation

## 2022-03-11 DIAGNOSIS — M25619 Stiffness of unspecified shoulder, not elsewhere classified: Secondary | ICD-10-CM | POA: Diagnosis not present

## 2022-03-11 DIAGNOSIS — M6281 Muscle weakness (generalized): Secondary | ICD-10-CM | POA: Diagnosis not present

## 2022-03-11 NOTE — Therapy (Signed)
OUTPATIENT PHYSICAL THERAPY TREATMENT   Patient Name: Angela Fernandez MRN: [id number] DOB:01/10/1946, 76 y.o., female Today's Date: 03/12/2022   PT End of Session - 03/11/22 1145     Visit Number 23    Number of Visits 44    Date for PT Re-Evaluation 05/20/22    Authorization Type [employer] Medicare    Authorization Time Period 12/04/2021- 02/26/2022; 10/19- 05/20/2022    Progress Note Due on Visit 30    PT Start Time 1141    PT Stop Time 1217    PT Time Calculation (min) 36 min    Activity Tolerance Patient tolerated treatment well;No increased pain    Behavior During Therapy WFL for tasks assessed/performed                Past Medical History:  Diagnosis Date   Anemia    Arthritis    Bipolar affect, depressed ([place])    COPD (chronic obstructive pulmonary disease) (HCC)    Diabetes mellitus without complication (HCC)    type II   GERD (gastroesophageal reflux disease)    Hyperlipidemia    Hypertension    Hypothyroidism    PTSD (post-traumatic stress disorder)    Schizoaffective disorder ([place])    Sleep apnea    cpap   Stroke ([place])    hx of  mini stroke - 2001   TIA (transient ischemic attack) 2010   UTI (urinary tract infection)    Past Surgical History:  Procedure Laterality Date   BILATERAL CARPAL TUNNEL RELEASE Bilateral    BREAST BIOPSY     BREAST SURGERY Right    lumpectomy   CATARACT EXTRACTION W/ INTRAOCULAR LENS  IMPLANT, BILATERAL Bilateral    CHOLECYSTECTOMY     COLONOSCOPY     ESOPHAGOGASTRODUODENOSCOPY     EYE SURGERY     JOINT REPLACEMENT     NASAL SINUS SURGERY     REPLACEMENT TOTAL KNEE BILATERAL Bilateral    REVERSE SHOULDER ARTHROPLASTY Right 09/02/2020   Procedure: REVERSE SHOULDER ARTHROPLASTY;  Surgeon: Corky Mull, MD;  Location: [facility] ORS;  Service: Orthopedics;  Laterality: Right;   SHOULDER SURGERY Right 2020   rotator cuff repair   TOTAL SHOULDER REVISION Right 10/23/2021   Procedure: REVERSE SHOULDER REVISION;  Surgeon: Nicholes Stairs, MD;  Location: [facility];  Service: Orthopedics;  Laterality: Right;  180   Patient Active Problem List   Diagnosis Date Noted   Hx of sleep apnea 12/21/2021   S/P reverse total shoulder arthroplasty, right 10/23/2021   Cystocele with rectocele 04/10/2021   Mixed urinary incontinence due to female genital prolapse 04/10/2021   Submucous leiomyoma of uterus 04/10/2021   Thickened endometrium 04/10/2021   Pelvic pressure in female 02/11/2021   DJD (degenerative joint disease) of cervical spine 12/28/2020   Anxiety, generalized 12/28/2020   History of depression 12/28/2020   Memory difficulty 12/28/2020   Chronic venous insufficiency 12/24/2020   Lymphedema 12/24/2020   Diabetes ([place]) 12/24/2020   Hyperlipidemia 12/24/2020   Essential hypertension 12/24/2020   Closed fracture of neck of right scapula 10/17/2020   Status post reverse total shoulder replacement, right 09/02/2020   Rotator cuff arthropathy, right 07/14/2020    PCP: Adrian Prows  REFERRING PROVIDER: Dr. Victorino December ([facility])  REFERRING DIAG: s/p Right Reverse Total shoulder arthroplasty (REVISION) 10/23/2021  THERAPY DIAG:  Muscle weakness (generalized)  Right shoulder pain, unspecified chronicity  Limited range of motion (ROM) of shoulder  S/P reverse total shoulder arthroplasty, right  Rationale for Evaluation  and Treatment Rehabilitation  ONSET DATE: 10/23/2021  SUBJECTIVE:  Pt reports no new issues and states doing well overall without any complaint of R shoulder pain  PERTINENT HISTORY: Angela Fernandez is a 76 y.o. female who complains of right shoulder pain and dysfunction following a elective reverse arthroplasty performed over in [place] a number of months ago.  She had early failure of the baseplate with loosening and now malposition.  PAIN:  Are you having pain? no  PRECAUTIONS: Shoulder- in demographics section   PATIENT GOALS  I want to be able to reach the top of head  and also paint as well  OBJECTIVE:   TODAY'S TREATMENT: 03/12/22   TherEx: Supine-  Standing cable elbow extension 2x15, 7.5# Standing elbow flexion/biceps curl, 2x15, RTB Standing  scapular retraction, 2x15 reps, RTB Standing AROM with shoulder flex/abd/ext x 15 reps each Standing resistive deltoid strengthening - ant/middle/posterior with RTB- 2 sets of 12 reps Standing Shoulder IR - RTB 2 sets of 12 reps Wall pushup x10 Wall Angels, x10 Prone - Shoulder flex/Horizontal ABD/Ext - 2 sets of 12 reps      PATIENT EDUCATION: Education details: Exercise technique; progression of HEP Person educated: Patient Education method: Explanation, Demonstration, Tactile cues, and Verbal cues Education comprehension: verbalized understanding, returned demonstration, verbal cues required, tactile cues required, and needs further education   HOME EXERCISE PROGRAM:  Access Code: [email or web address] URL: [email or web address] Date: 02/18/2022 Prepared by: Amalia Hailey  Exercises - Standing Shoulder Flexion with Resistance  - 1 x daily - 3 x weekly - 3 sets - 10 reps - Standing Single Arm Shoulder Abduction with Resistance  - 1 x daily - 3 x weekly - 3 sets - 10 reps - Shoulder External Rotation and Scapular Retraction  - 1 x daily - 3 x weekly - 3 sets - 10 reps - Prone Single Arm Shoulder Y  - 1 x daily - 3 x weekly - 3 sets - 10 reps - Prone Shoulder Horizontal Abduction  - 1 x daily - 3 x weekly - 3 sets - 10 reps - Prone Shoulder Extension - Single Arm  - 1 x daily - 3 x weekly - 3 sets - 10 reps - Prone Shoulder Row  - 1 x daily - 3 x weekly - 3 sets - 10 reps - Seated Upper Trapezius Stretch  - 1 x daily - 7 x weekly - 3 sets - 10 reps - 30 hold - Gentle Levator Scapulae Stretch  - 1 x daily - 7 x weekly - 3 sets - 10 reps - 30 hold   ASSESSMENT:  CLINICAL IMPRESSION:  Patient performed well today with increased focus on home exercises - resistive training with use of  theraband or active exercises. She is requiring less overall verbal cues to perform exercises correctly and no report of specific pain during session.  Pt will continue to benefit from skilled therapy to address remaining deficits in order to improve overall QoL and return to PLOF.      OBJECTIVE IMPAIRMENTS decreased activity tolerance, decreased mobility, decreased ROM, decreased strength, decreased safety awareness, hypomobility, impaired flexibility, impaired UE functional use, postural dysfunction, and pain.   ACTIVITY LIMITATIONS carrying, lifting, sleeping, bathing, dressing, reach over head, and hygiene/grooming  PARTICIPATION LIMITATIONS: meal prep, cleaning, laundry, driving, shopping, community activity, and yard work  PERSONAL FACTORS Behavior pattern and 1-2 comorbidities: COPD, Mult shoulder surgeries  are also affecting patient's functional outcome.   REHAB POTENTIAL: Good  CLINICAL DECISION MAKING: Evolving/moderate  complexity  EVALUATION COMPLEXITY: Low   GOALS: Goals reviewed with patient? Yes  SHORT TERM GOALS: Target date:   Pt will be independent with initial HEP in order to improve strength and decrease pain in order to improve pain-free function at home and work. Baseline: 12/04/2021- No formal HEP in place; 02/25/2022- Patient reports doing her HEP currently everyday.  Goal status: GOAL MET   LONG TERM GOALS: Target date: 05/20/2022  Pt will be independent with Final HEP in order to improve strength and decrease pain in order to improve pain-free function at home and work. Baseline: 12/04/2021- No formal HEP in place; 02/25/2022- Patient reports doing her HEP currently everyday.  Goal status: ONGOING  2.  Pt will decrease quick DASH score by at least 8% in order to demonstrate clinically significant reduction in disability. Baseline: 12/04/2021= 61; 9/14: 47.73 % Goal status: Goal Met  3.  Pt will improve FOTO to target score of 51% to display perceived  improvements in ability to complete ADL's.  Baseline: 12/04/2021=40; 9/14: 54%  Goal status: Goal Met  4.  Pt will decrease worst pain as reported on NPRS by at least 3 points in order to demonstrate clinically significant reduction in pain. Baseline: 12/04/2021= 6/10 Right shoulder pain; 9/14 - 4/10 worst pain over past week; 02/25/2022= 3-4/10 right shoulder pain- Pain management includes ROM/stretching, ice,  Goal status: On-Going  5.  Patient will demo > 140 deg of right shoulder elevation (PROM) for improved ROM and overhead activities Baseline: 12/04/2021=PROM R shoulder flex- 118; 105 ABD; 9/14 - R Shoulder; 10 /19/2023= flex- 162 and ABD= 158 Goal status: MET  6.  Patient will demo > 100 deg of right shoulder elevation (AROM) for improved ROM and overhead activities Baseline: 12/04/2021=PROM R shoulder flex- 118; 105 ABD; 9/14 - R Shoulder Flexion: 142 deg; Abduction: 121 deg. Goal status: Goal Met  7.  Patient will be able to mop 2 rooms at home without increase in pain to demonstrate increased strength without pain.   Baseline: 01/21/2022 - Pt states she is only able to mop 1 room without pain and any more it jumps up to 4/10.; 02/25/2022= Patient able to mop multiple rooms without difficulty.  Goal status: GOAL MET  8.  Patient will demo > 150 deg of right shoulder elevation (AROM) for improved ROM and overhead activities Baseline: 02/25/2022- Right shoulder flex= 130 deg without UT compensation; Abd= 125 deg Goal status: Initial  9. Patient will transition from skilled PT setting for shoulder rehab to home or gym based program to promote more independence and plan for future.   Baseline: Patient continues with outpatient PT requiring VC and assist with knowlegdge base and execution of prescribed exercises.  GOAL status: Initial    PLAN: PT FREQUENCY: 1-2x/week  PT DURATION: 12 weeks  PLANNED INTERVENTIONS: Therapeutic exercises, Therapeutic activity, Patient/Family education,  Self Care, Joint mobilization, Dry Needling, Spinal manipulation, Spinal mobilization, Cryotherapy, Moist heat, scar mobilization, Taping, Ultrasound, and Manual therapy  PLAN FOR NEXT SESSION: RUE strengthening - resistive/AROM    Ollen Bowl, PT Physical Therapist- [facility]  03/12/22, 3:01 PM

## 2022-03-12 ENCOUNTER — Other Ambulatory Visit: Payer: Self-pay | Admitting: Psychiatry

## 2022-03-15 ENCOUNTER — Other Ambulatory Visit: Payer: Self-pay | Admitting: Infectious Diseases

## 2022-03-15 DIAGNOSIS — Z1231 Encounter for screening mammogram for malignant neoplasm of breast: Secondary | ICD-10-CM

## 2022-03-15 NOTE — Therapy (Signed)
OUTPATIENT PHYSICAL THERAPY TREATMENT   Patient Name: Angela Fernandez MRN: [id number] DOB:January 24, 1946, 76 y.o., female Today's Date: 03/16/2022   PT End of Session - 03/16/22 1149     Visit Number 24    Number of Visits 44    Date for PT Re-Evaluation 05/20/22    Authorization Type UHC Medicare    Authorization Time Period 12/04/2021- 02/26/2022; 10/19- 05/20/2022    Progress Note Due on Visit 30    PT Start Time 1148    PT Stop Time 1230    PT Time Calculation (min) 42 min    Activity Tolerance Patient tolerated treatment well;No increased pain    Behavior During Therapy WFL for tasks assessed/performed                 Past Medical History:  Diagnosis Date   Anemia    Arthritis    Bipolar affect, depressed ([place])    COPD (chronic obstructive pulmonary disease) (HCC)    Diabetes mellitus without complication (HCC)    type II   GERD (gastroesophageal reflux disease)    Hyperlipidemia    Hypertension    Hypothyroidism    PTSD (post-traumatic stress disorder)    Schizoaffective disorder ([place])    Sleep apnea    cpap   Stroke ([place])    hx of  mini stroke - 2001   TIA (transient ischemic attack) 2010   UTI (urinary tract infection)    Past Surgical History:  Procedure Laterality Date   BILATERAL CARPAL TUNNEL RELEASE Bilateral    BREAST BIOPSY     BREAST SURGERY Right    lumpectomy   CATARACT EXTRACTION W/ INTRAOCULAR LENS  IMPLANT, BILATERAL Bilateral    CHOLECYSTECTOMY     COLONOSCOPY     ESOPHAGOGASTRODUODENOSCOPY     EYE SURGERY     JOINT REPLACEMENT     NASAL SINUS SURGERY     REPLACEMENT TOTAL KNEE BILATERAL Bilateral    REVERSE SHOULDER ARTHROPLASTY Right 09/02/2020   Procedure: REVERSE SHOULDER ARTHROPLASTY;  Surgeon: Corky Mull, MD;  Location: [facility];  Service: Orthopedics;  Laterality: Right;   SHOULDER SURGERY Right 2020   rotator cuff repair   TOTAL SHOULDER REVISION Right 10/23/2021   Procedure: REVERSE SHOULDER REVISION;  Surgeon: Nicholes Stairs, MD;  Location: [facility];  Service: Orthopedics;  Laterality: Right;  180   Patient Active Problem List   Diagnosis Date Noted   Hx of sleep apnea 12/21/2021   S/P reverse total shoulder arthroplasty, right 10/23/2021   Cystocele with rectocele 04/10/2021   Mixed urinary incontinence due to female genital prolapse 04/10/2021   Submucous leiomyoma of uterus 04/10/2021   Thickened endometrium 04/10/2021   Pelvic pressure in female 02/11/2021   DJD (degenerative joint disease) of cervical spine 12/28/2020   Anxiety, generalized 12/28/2020   History of depression 12/28/2020   Memory difficulty 12/28/2020   Chronic venous insufficiency 12/24/2020   Lymphedema 12/24/2020   Diabetes ([place]) 12/24/2020   Hyperlipidemia 12/24/2020   Essential hypertension 12/24/2020   Closed fracture of neck of right scapula 10/17/2020   Status post reverse total shoulder replacement, right 09/02/2020   Rotator cuff arthropathy, right 07/14/2020    PCP: Adrian Prows  REFERRING PROVIDER: Dr. Victorino December ([facility])  REFERRING DIAG: s/p Right Reverse Total shoulder arthroplasty (REVISION) 10/23/2021  THERAPY DIAG:  Muscle weakness (generalized)  Right shoulder pain, unspecified chronicity  Limited range of motion (ROM) of shoulder  S/P reverse total shoulder arthroplasty, right  Rationale for  Evaluation and Treatment Rehabilitation   ONSET DATE: 10/23/2021   SUBJECTIVE:    Pt reports she is doing well, other than her R foot hurting.  She notes 8/10 pain in that foot.  Pt reports that she was hurting yesterday in the shoulder and was likely from performing tasks like tossing wood which she shouldn't be doing.   PERTINENT HISTORY: Angela Fernandez is a 75 y.o. female who complains of right shoulder pain and dysfunction following a elective reverse arthroplasty performed over in [place] a number of months ago.  She had early failure of the baseplate with loosening and now  malposition.  PAIN:  Are you having pain? Yes, 8/10 pain in the R foot from arthritis; No shoulder  PRECAUTIONS: Shoulder- in demographics section   PATIENT GOALS  I want to be able to reach the top of head and also paint as well  OBJECTIVE:   TODAY'S TREATMENT: 03/16/22   TherEx:  Standing cable elbow extension 2x15, 12.5# Standing elbow flexion/biceps curl, 2x15, RTB Standing scapular retraction, 2x15 reps, RTB Standing AROM with shoulder flex/abd/ext x 15 reps each wth use of the UE Ranger Standing resistive deltoid strengthening - ant/middle/posterior with RTB- 2 sets of 12 reps Standing Shoulder IR - RTB 2 sets of 12 reps Wall pushups, x10 Wall Angels, x10     PATIENT EDUCATION: Education details: Exercise technique; progression of HEP Person educated: Patient Education method: Explanation, Demonstration, Tactile cues, and Verbal cues Education comprehension: verbalized understanding, returned demonstration, verbal cues required, tactile cues required, and needs further education   HOME EXERCISE PROGRAM:  Access Code: [email or web address] URL: [email or web address] Date: 02/18/2022 Prepared by: Amalia Hailey  Exercises - Standing Shoulder Flexion with Resistance  - 1 x daily - 3 x weekly - 3 sets - 10 reps - Standing Single Arm Shoulder Abduction with Resistance  - 1 x daily - 3 x weekly - 3 sets - 10 reps - Shoulder External Rotation and Scapular Retraction  - 1 x daily - 3 x weekly - 3 sets - 10 reps - Prone Single Arm Shoulder Y  - 1 x daily - 3 x weekly - 3 sets - 10 reps - Prone Shoulder Horizontal Abduction  - 1 x daily - 3 x weekly - 3 sets - 10 reps - Prone Shoulder Extension - Single Arm  - 1 x daily - 3 x weekly - 3 sets - 10 reps - Prone Shoulder Row  - 1 x daily - 3 x weekly - 3 sets - 10 reps - Seated Upper Trapezius Stretch  - 1 x daily - 7 x weekly - 3 sets - 10 reps - 30 hold - Gentle Levator Scapulae Stretch  - 1 x daily - 7 x weekly - 3 sets  - 10 reps - 30 hold   ASSESSMENT:  CLINICAL IMPRESSION:  Pt performed well with continued strengthening exercises and is able to tolerate increased reps of exercises without complaints.  Pt is still experiencing significant weakness noted with any full attempt at ROM, but is doing well overall.   Pt will continue to benefit from skilled therapy to address remaining deficits in order to improve overall QoL and return to PLOF.       OBJECTIVE IMPAIRMENTS decreased activity tolerance, decreased mobility, decreased ROM, decreased strength, decreased safety awareness, hypomobility, impaired flexibility, impaired UE functional use, postural dysfunction, and pain.   ACTIVITY LIMITATIONS carrying, lifting, sleeping, bathing, dressing, reach over head, and hygiene/grooming  PARTICIPATION LIMITATIONS: meal prep, cleaning, laundry,  driving, shopping, community activity, and yard work  PERSONAL FACTORS Behavior pattern and 1-2 comorbidities: COPD, Mult shoulder surgeries  are also affecting patient's functional outcome.   REHAB POTENTIAL: Good  CLINICAL DECISION MAKING: Evolving/moderate complexity  EVALUATION COMPLEXITY: Low   GOALS: Goals reviewed with patient? Yes  SHORT TERM GOALS: Target date:   Pt will be independent with initial HEP in order to improve strength and decrease pain in order to improve pain-free function at home and work. Baseline: 12/04/2021- No formal HEP in place; 02/25/2022- Patient reports doing her HEP currently everyday.  Goal status: GOAL MET   LONG TERM GOALS: Target date: 05/20/2022  Pt will be independent with Final HEP in order to improve strength and decrease pain in order to improve pain-free function at home and work. Baseline: 12/04/2021- No formal HEP in place; 02/25/2022- Patient reports doing her HEP currently everyday.  Goal status: ONGOING  2.  Pt will decrease quick DASH score by at least 8% in order to demonstrate clinically significant reduction  in disability. Baseline: 12/04/2021= 61; 9/14: 47.73 % Goal status: Goal Met  3.  Pt will improve FOTO to target score of 51% to display perceived improvements in ability to complete ADL's.  Baseline: 12/04/2021=40; 9/14: 54%  Goal status: Goal Met  4.  Pt will decrease worst pain as reported on NPRS by at least 3 points in order to demonstrate clinically significant reduction in pain. Baseline: 12/04/2021= 6/10 Right shoulder pain; 9/14 - 4/10 worst pain over past week; 02/25/2022= 3-4/10 right shoulder pain- Pain management includes ROM/stretching, ice,  Goal status: On-Going  5.  Patient will demo > 140 deg of right shoulder elevation (PROM) for improved ROM and overhead activities Baseline: 12/04/2021=PROM R shoulder flex- 118; 105 ABD; 9/14 - R Shoulder; 10 /19/2023= flex- 162 and ABD= 158 Goal status: MET  6.  Patient will demo > 100 deg of right shoulder elevation (AROM) for improved ROM and overhead activities Baseline: 12/04/2021=PROM R shoulder flex- 118; 105 ABD; 9/14 - R Shoulder Flexion: 142 deg; Abduction: 121 deg. Goal status: Goal Met  7.  Patient will be able to mop 2 rooms at home without increase in pain to demonstrate increased strength without pain.   Baseline: 01/21/2022 - Pt states she is only able to mop 1 room without pain and any more it jumps up to 4/10.; 02/25/2022= Patient able to mop multiple rooms without difficulty.  Goal status: GOAL MET  8.  Patient will demo > 150 deg of right shoulder elevation (AROM) for improved ROM and overhead activities Baseline: 02/25/2022- Right shoulder flex= 130 deg without UT compensation; Abd= 125 deg Goal status: Initial  9. Patient will transition from skilled PT setting for shoulder rehab to home or gym based program to promote more independence and plan for future.   Baseline: Patient continues with outpatient PT requiring VC and assist with knowlegdge base and execution of prescribed exercises.  GOAL status: Initial     PLAN: PT FREQUENCY: 1-2x/week  PT DURATION: 12 weeks  PLANNED INTERVENTIONS: Therapeutic exercises, Therapeutic activity, Patient/Family education, Self Care, Joint mobilization, Dry Needling, Spinal manipulation, Spinal mobilization, Cryotherapy, Moist heat, scar mobilization, Taping, Ultrasound, and Manual therapy  PLAN FOR NEXT SESSION:  RUE strengthening - resistive/AROM    Ollen Bowl, PT Physical Therapist- [facility]  03/16/22, 1:00 PM

## 2022-03-16 ENCOUNTER — Ambulatory Visit: Payer: Medicare Other

## 2022-03-16 DIAGNOSIS — M25511 Pain in right shoulder: Secondary | ICD-10-CM

## 2022-03-16 DIAGNOSIS — Z96611 Presence of right artificial shoulder joint: Secondary | ICD-10-CM

## 2022-03-16 DIAGNOSIS — M6281 Muscle weakness (generalized): Secondary | ICD-10-CM | POA: Diagnosis not present

## 2022-03-16 DIAGNOSIS — R262 Difficulty in walking, not elsewhere classified: Secondary | ICD-10-CM | POA: Diagnosis not present

## 2022-03-16 DIAGNOSIS — M25619 Stiffness of unspecified shoulder, not elsewhere classified: Secondary | ICD-10-CM
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
## 2022-03-18 ENCOUNTER — Ambulatory Visit: Payer: Medicare Other

## 2022-03-22 ENCOUNTER — Encounter: Payer: Self-pay | Admitting: Psychiatry

## 2022-03-22 ENCOUNTER — Ambulatory Visit (INDEPENDENT_AMBULATORY_CARE_PROVIDER_SITE_OTHER): Payer: Medicare Other | Admitting: Psychiatry

## 2022-03-22 VITALS — BP 152/74 | HR 80 | Temp 97.9°F | Ht 65.5 in | Wt 193.8 lb

## 2022-03-22 DIAGNOSIS — F4310 Post-traumatic stress disorder, unspecified: Secondary | ICD-10-CM

## 2022-03-22 DIAGNOSIS — F259 Schizoaffective disorder, unspecified: Secondary | ICD-10-CM

## 2022-03-22 DIAGNOSIS — R4189 Other symptoms and signs involving cognitive functions and awareness: Secondary | ICD-10-CM

## 2022-03-22 DIAGNOSIS — F411 Generalized anxiety disorder: Secondary | ICD-10-CM

## 2022-03-22 MED ORDER — ZOLPIDEM TARTRATE 5 MG PO TABS: 5.0000 mg | ORAL_TABLET | Freq: Every evening | ORAL | 1 refills | Status: DC | PRN

## 2022-03-22 MED ORDER — CLONAZEPAM 1 MG PO TABS: ORAL_TABLET | ORAL | 1 refills | Status: DC

## 2022-03-22 NOTE — Patient Instructions (Signed)
Continue Pristiq 50 mg every day Continue bupropion 200 mg twice a day Continue Latuda 120 mg at night  Continue clonazepam 1 mg in AM, 0.5 mg in pm, and 0.5-1 mg at night.  Continue Ambien 5 mg at night as needed for insomnia Next appointment: 1/15 at 8:30, in person

## 2022-03-23 ENCOUNTER — Ambulatory Visit: Payer: Medicare Other

## 2022-03-23 DIAGNOSIS — R262 Difficulty in walking, not elsewhere classified: Secondary | ICD-10-CM | POA: Diagnosis not present

## 2022-03-23 DIAGNOSIS — M25619 Stiffness of unspecified shoulder, not elsewhere classified: Secondary | ICD-10-CM | POA: Diagnosis not present

## 2022-03-23 DIAGNOSIS — M6281 Muscle weakness (generalized): Secondary | ICD-10-CM | POA: Diagnosis not present

## 2022-03-23 DIAGNOSIS — Z96611 Presence of right artificial shoulder joint: Secondary | ICD-10-CM | POA: Diagnosis not present

## 2022-03-23 DIAGNOSIS — M25511 Pain in right shoulder: Secondary | ICD-10-CM

## 2022-03-23 NOTE — Therapy (Signed)
OUTPATIENT PHYSICAL THERAPY TREATMENT   Patient Name: Angela Fernandez MRN: [id number] DOB:August 20, 1945, 76 y.o., female Today's Date: 03/23/2022   PT End of Session - 03/23/22 1329     Visit Number 25    Number of Visits 44    Date for PT Re-Evaluation 05/20/22    Authorization Type UHC Medicare    Authorization Time Period 12/04/2021- 02/26/2022; 10/19- 05/20/2022    Progress Note Due on Visit 30    PT Start Time 1330    PT Stop Time 1417    PT Time Calculation (min) 47 min    Activity Tolerance Patient tolerated treatment well;No increased pain    Behavior During Therapy WFL for tasks assessed/performed                 Past Medical History:  Diagnosis Date   Anemia    Arthritis    Bipolar affect, depressed ([place])    COPD (chronic obstructive pulmonary disease) (HCC)    Diabetes mellitus without complication (HCC)    type II   GERD (gastroesophageal reflux disease)    Hyperlipidemia    Hypertension    Hypothyroidism    PTSD (post-traumatic stress disorder)    Schizoaffective disorder ([place])    Sleep apnea    cpap   Stroke ([place])    hx of  mini stroke - 2001   TIA (transient ischemic attack) 2010   UTI (urinary tract infection)    Past Surgical History:  Procedure Laterality Date   BILATERAL CARPAL TUNNEL RELEASE Bilateral    BREAST BIOPSY     BREAST SURGERY Right    lumpectomy   CATARACT EXTRACTION W/ INTRAOCULAR LENS  IMPLANT, BILATERAL Bilateral    CHOLECYSTECTOMY     COLONOSCOPY     ESOPHAGOGASTRODUODENOSCOPY     EYE SURGERY     JOINT REPLACEMENT     NASAL SINUS SURGERY     REPLACEMENT TOTAL KNEE BILATERAL Bilateral    REVERSE SHOULDER ARTHROPLASTY Right 09/02/2020   Procedure: REVERSE SHOULDER ARTHROPLASTY;  Surgeon: Corky Mull, MD;  Location: [facility] ORS;  Service: Orthopedics;  Laterality: Right;   SHOULDER SURGERY Right 2020   rotator cuff repair   TOTAL SHOULDER REVISION Right 10/23/2021   Procedure: REVERSE SHOULDER REVISION;  Surgeon: Nicholes Stairs, MD;  Location: [facility];  Service: Orthopedics;  Laterality: Right;  180   Patient Active Problem List   Diagnosis Date Noted   Hx of sleep apnea 12/21/2021   S/P reverse total shoulder arthroplasty, right 10/23/2021   Cystocele with rectocele 04/10/2021   Mixed urinary incontinence due to female genital prolapse 04/10/2021   Submucous leiomyoma of uterus 04/10/2021   Thickened endometrium 04/10/2021   Pelvic pressure in female 02/11/2021   DJD (degenerative joint disease) of cervical spine 12/28/2020   Anxiety, generalized 12/28/2020   History of depression 12/28/2020   Memory difficulty 12/28/2020   Chronic venous insufficiency 12/24/2020   Lymphedema 12/24/2020   Diabetes ([place]) 12/24/2020   Hyperlipidemia 12/24/2020   Essential hypertension 12/24/2020   Closed fracture of neck of right scapula 10/17/2020   Status post reverse total shoulder replacement, right 09/02/2020   Rotator cuff arthropathy, right 07/14/2020    PCP: Adrian Prows  REFERRING PROVIDER: Dr. Victorino December ([facility])  REFERRING DIAG: s/p Right Reverse Total shoulder arthroplasty (REVISION) 10/23/2021  THERAPY DIAG:  Muscle weakness (generalized)  Right shoulder pain, unspecified chronicity  Limited range of motion (ROM) of shoulder  S/P reverse total shoulder arthroplasty, right  Difficulty in  walking, not elsewhere classified  Rationale for Evaluation and Treatment Rehabilitation   ONSET DATE: 10/23/2021   SUBJECTIVE:    Pt reports she is doing well, other than her R foot hurting.  She notes 8/10 pain in that foot.  Pt reports that she was hurting yesterday in the shoulder and was likely from performing tasks like tossing wood which she shouldn't be doing.   PERTINENT HISTORY: Angela Fernandez is a 76 y.o. female who complains of right shoulder pain and dysfunction following a elective reverse arthroplasty performed over in [place] a number of months ago.  She had early  failure of the baseplate with loosening and now malposition.  PAIN:  Are you having pain? Yes, 8/10 pain in the R foot from arthritis; No shoulder  PRECAUTIONS: Shoulder- in demographics section   PATIENT GOALS  I want to be able to reach the top of head and also paint as well  OBJECTIVE:   TODAY'S TREATMENT: 03/23/22   TherEx: Arm ranger- RUE- flex x 20, scaption x 20 reps, horizontal ABD x 20 reps Standing cable elbow extension 2x15, 12.5# Standing elbow flexion/biceps curl cable at 7.5#, 2x15,  Standing scapular retraction, 2x15 reps, RTB Standing resistive deltoid strengthening - ant/middle/posterior with RTB- 2 sets of 12 reps Standing Shoulder IR - RTB 2 sets of 12 reps Wall pushups, x12 Wall Angels, x12 Wiping wall with washcloth - sm. Diameter circles - CW, CCW x 12 reps each    Manual Therapy:   PROM to Right Shoulder - flex/abd/ER Long axis inferior distraction with small amplitude mobs- x 20 reps      PATIENT EDUCATION: Education details: Exercise technique; progression of HEP Person educated: Patient Education method: Explanation, Demonstration, Tactile cues, and Verbal cues Education comprehension: verbalized understanding, returned demonstration, verbal cues required, tactile cues required, and needs further education   HOME EXERCISE PROGRAM:  Access Code: [email or web address] URL: [email or web address] Date: 02/18/2022 Prepared by: Amalia Hailey  Exercises - Standing Shoulder Flexion with Resistance  - 1 x daily - 3 x weekly - 3 sets - 10 reps - Standing Single Arm Shoulder Abduction with Resistance  - 1 x daily - 3 x weekly - 3 sets - 10 reps - Shoulder External Rotation and Scapular Retraction  - 1 x daily - 3 x weekly - 3 sets - 10 reps - Prone Single Arm Shoulder Y  - 1 x daily - 3 x weekly - 3 sets - 10 reps - Prone Shoulder Horizontal Abduction  - 1 x daily - 3 x weekly - 3 sets - 10 reps - Prone Shoulder Extension - Single Arm  - 1 x  daily - 3 x weekly - 3 sets - 10 reps - Prone Shoulder Row  - 1 x daily - 3 x weekly - 3 sets - 10 reps - Seated Upper Trapezius Stretch  - 1 x daily - 7 x weekly - 3 sets - 10 reps - 30 hold - Gentle Levator Scapulae Stretch  - 1 x daily - 7 x weekly - 3 sets - 10 reps - 30 hold   ASSESSMENT:  CLINICAL IMPRESSION:  Patient presents with good rehab motivation- willing to try increased reps and different exercises. Patient reported increased soreness with overhead activities. She continues to present with most weakness with overhead mobility and ER. Pt will continue to benefit from skilled therapy to address remaining deficits in order to improve overall QoL and return to PLOF.       OBJECTIVE IMPAIRMENTS decreased  activity tolerance, decreased mobility, decreased ROM, decreased strength, decreased safety awareness, hypomobility, impaired flexibility, impaired UE functional use, postural dysfunction, and pain.   ACTIVITY LIMITATIONS carrying, lifting, sleeping, bathing, dressing, reach over head, and hygiene/grooming  PARTICIPATION LIMITATIONS: meal prep, cleaning, laundry, driving, shopping, community activity, and yard work  PERSONAL FACTORS Behavior pattern and 1-2 comorbidities: COPD, Mult shoulder surgeries  are also affecting patient's functional outcome.   REHAB POTENTIAL: Good  CLINICAL DECISION MAKING: Evolving/moderate complexity  EVALUATION COMPLEXITY: Low   GOALS: Goals reviewed with patient? Yes  SHORT TERM GOALS: Target date:   Pt will be independent with initial HEP in order to improve strength and decrease pain in order to improve pain-free function at home and work. Baseline: 12/04/2021- No formal HEP in place; 02/25/2022- Patient reports doing her HEP currently everyday.  Goal status: GOAL MET   LONG TERM GOALS: Target date: 05/20/2022  Pt will be independent with Final HEP in order to improve strength and decrease pain in order to improve pain-free function  at home and work. Baseline: 12/04/2021- No formal HEP in place; 02/25/2022- Patient reports doing her HEP currently everyday.  Goal status: ONGOING  2.  Pt will decrease quick DASH score by at least 8% in order to demonstrate clinically significant reduction in disability. Baseline: 12/04/2021= 61; 9/14: 47.73 % Goal status: Goal Met  3.  Pt will improve FOTO to target score of 51% to display perceived improvements in ability to complete ADL's.  Baseline: 12/04/2021=40; 9/14: 54%  Goal status: Goal Met  4.  Pt will decrease worst pain as reported on NPRS by at least 3 points in order to demonstrate clinically significant reduction in pain. Baseline: 12/04/2021= 6/10 Right shoulder pain; 9/14 - 4/10 worst pain over past week; 02/25/2022= 3-4/10 right shoulder pain- Pain management includes ROM/stretching, ice,  Goal status: On-Going  5.  Patient will demo > 140 deg of right shoulder elevation (PROM) for improved ROM and overhead activities Baseline: 12/04/2021=PROM R shoulder flex- 118; 105 ABD; 9/14 - R Shoulder; 10 /19/2023= flex- 162 and ABD= 158 Goal status: MET  6.  Patient will demo > 100 deg of right shoulder elevation (AROM) for improved ROM and overhead activities Baseline: 12/04/2021=PROM R shoulder flex- 118; 105 ABD; 9/14 - R Shoulder Flexion: 142 deg; Abduction: 121 deg. Goal status: Goal Met  7.  Patient will be able to mop 2 rooms at home without increase in pain to demonstrate increased strength without pain.   Baseline: 01/21/2022 - Pt states she is only able to mop 1 room without pain and any more it jumps up to 4/10.; 02/25/2022= Patient able to mop multiple rooms without difficulty.  Goal status: GOAL MET  8.  Patient will demo > 150 deg of right shoulder elevation (AROM) for improved ROM and overhead activities Baseline: 02/25/2022- Right shoulder flex= 130 deg without UT compensation; Abd= 125 deg Goal status: Initial  9. Patient will transition from skilled PT setting  for shoulder rehab to home or gym based program to promote more independence and plan for future.   Baseline: Patient continues with outpatient PT requiring VC and assist with knowlegdge base and execution of prescribed exercises.  GOAL status: Initial    PLAN: PT FREQUENCY: 1-2x/week  PT DURATION: 12 weeks  PLANNED INTERVENTIONS: Therapeutic exercises, Therapeutic activity, Patient/Family education, Self Care, Joint mobilization, Dry Needling, Spinal manipulation, Spinal mobilization, Cryotherapy, Moist heat, scar mobilization, Taping, Ultrasound, and Manual therapy  PLAN FOR NEXT SESSION:  RUE strengthening - resistive/AROM  Ollen Bowl, PT Physical Therapist- [facility]  03/23/22, 2:31 PM

## 2022-03-25 ENCOUNTER — Ambulatory Visit: Payer: Medicare Other

## 2022-03-25 DIAGNOSIS — M6281 Muscle weakness (generalized): Secondary | ICD-10-CM

## 2022-03-25 DIAGNOSIS — M25619 Stiffness of unspecified shoulder, not elsewhere classified: Secondary | ICD-10-CM | POA: Diagnosis not present

## 2022-03-25 DIAGNOSIS — Z96611 Presence of right artificial shoulder joint: Secondary | ICD-10-CM

## 2022-03-25 DIAGNOSIS — R262 Difficulty in walking, not elsewhere classified: Secondary | ICD-10-CM | POA: Diagnosis not present

## 2022-03-25 DIAGNOSIS — M25511 Pain in right shoulder: Secondary | ICD-10-CM | POA: Diagnosis not present

## 2022-03-25 NOTE — Therapy (Signed)
OUTPATIENT PHYSICAL THERAPY TREATMENT   Patient Name: Angela Fernandez MRN: [id number] DOB:05/31/1945, 76 y.o., female Today's Date: 03/26/2022   PT End of Session - 03/25/22 1149     Visit Number 26    Number of Visits 44    Date for PT Re-Evaluation 05/20/22    Authorization Type UHC Medicare    Authorization Time Period 12/04/2021- 02/26/2022; 10/19- 05/20/2022    Progress Note Due on Visit 30    PT Start Time 1140    PT Stop Time 1225    PT Time Calculation (min) 45 min    Activity Tolerance Patient tolerated treatment well;No increased pain    Behavior During Therapy WFL for tasks assessed/performed                  Past Medical History:  Diagnosis Date   Anemia    Arthritis    Bipolar affect, depressed ([place])    COPD (chronic obstructive pulmonary disease) (HCC)    Diabetes mellitus without complication (HCC)    type II   GERD (gastroesophageal reflux disease)    Hyperlipidemia    Hypertension    Hypothyroidism    PTSD (post-traumatic stress disorder)    Schizoaffective disorder ([place])    Sleep apnea    cpap   Stroke ([place])    hx of  mini stroke - 2001   TIA (transient ischemic attack) 2010   UTI (urinary tract infection)    Past Surgical History:  Procedure Laterality Date   BILATERAL CARPAL TUNNEL RELEASE Bilateral    BREAST BIOPSY     BREAST SURGERY Right    lumpectomy   CATARACT EXTRACTION W/ INTRAOCULAR LENS  IMPLANT, BILATERAL Bilateral    CHOLECYSTECTOMY     COLONOSCOPY     ESOPHAGOGASTRODUODENOSCOPY     EYE SURGERY     JOINT REPLACEMENT     NASAL SINUS SURGERY     REPLACEMENT TOTAL KNEE BILATERAL Bilateral    REVERSE SHOULDER ARTHROPLASTY Right 09/02/2020   Procedure: REVERSE SHOULDER ARTHROPLASTY;  Surgeon: Corky Mull, MD;  Location: [facility];  Service: Orthopedics;  Laterality: Right;   SHOULDER SURGERY Right 2020   rotator cuff repair   TOTAL SHOULDER REVISION Right 10/23/2021   Procedure: REVERSE SHOULDER REVISION;  Surgeon: Nicholes Stairs, MD;  Location: [facility];  Service: Orthopedics;  Laterality: Right;  180   Patient Active Problem List   Diagnosis Date Noted   Hx of sleep apnea 12/21/2021   S/P reverse total shoulder arthroplasty, right 10/23/2021   Cystocele with rectocele 04/10/2021   Mixed urinary incontinence due to female genital prolapse 04/10/2021   Submucous leiomyoma of uterus 04/10/2021   Thickened endometrium 04/10/2021   Pelvic pressure in female 02/11/2021   DJD (degenerative joint disease) of cervical spine 12/28/2020   Anxiety, generalized 12/28/2020   History of depression 12/28/2020   Memory difficulty 12/28/2020   Chronic venous insufficiency 12/24/2020   Lymphedema 12/24/2020   Diabetes ([place]) 12/24/2020   Hyperlipidemia 12/24/2020   Essential hypertension 12/24/2020   Closed fracture of neck of right scapula 10/17/2020   Status post reverse total shoulder replacement, right 09/02/2020   Rotator cuff arthropathy, right 07/14/2020    PCP: Adrian Prows  REFERRING PROVIDER: Dr. Victorino December ([facility])  REFERRING DIAG: s/p Right Reverse Total shoulder arthroplasty (REVISION) 10/23/2021  THERAPY DIAG:  Muscle weakness (generalized)  Right shoulder pain, unspecified chronicity  Limited range of motion (ROM) of shoulder  S/P reverse total shoulder arthroplasty, right  Rationale  for Evaluation and Treatment Rehabilitation   ONSET DATE: 10/23/2021   SUBJECTIVE:   Patient reports doing good and no particular soreness after last visit. States she was able to put her Christmas tree up and even put the star on top using her right hand.   PERTINENT HISTORY: Angela Fernandez is a 76 y.o. female who complains of right shoulder pain and dysfunction following a elective reverse arthroplasty performed over in [place] a number of months ago.  She had early failure of the baseplate with loosening and now malposition.  PAIN:  Are you having pain? Yes, 8/10 pain in the R foot from  arthritis; No shoulder  PRECAUTIONS: Shoulder- in demographics section   PATIENT GOALS  I want to be able to reach the top of head and also paint as well  OBJECTIVE:   TODAY'S TREATMENT: 03/26/22   TherEx: Arm ranger- RUE- flex x 20, scaption x 20 reps, horizontal ABD x 20 reps, Straight arm- sm diameter circles into larger diameter circles x 20+ revolutions.  Prone Deltoid strengthening: Right arm hanging off mat table- Shoulder flex to approx 60 deg; Full shoulder horizontal ABD, Full shoulder ext 2 sets x 15 reps  Standing cable elbow extension 2x15, 12.5# Standing elbow flexion/biceps curl cable at 7.5#, 2x15,  Standing scapular retraction, 2x15 reps, RTB Standing resistive deltoid strengthening - ant/middle/posterior with RTB- 2 sets of 12 reps Standing Shoulder IR - RTB 2 sets of 12 reps Wall pushups, x12 Wiping wall with washcloth - sm. Diameter circles - CW, CCW x 12 reps each          PATIENT EDUCATION: Education details: Exercise technique; progression of HEP Person educated: Patient Education method: Explanation, Demonstration, Tactile cues, and Verbal cues Education comprehension: verbalized understanding, returned demonstration, verbal cues required, tactile cues required, and needs further education   HOME EXERCISE PROGRAM:  Access Code: [email or web address] URL: [email or web address] Date: 02/18/2022 Prepared by: Amalia Hailey  Exercises - Standing Shoulder Flexion with Resistance  - 1 x daily - 3 x weekly - 3 sets - 10 reps - Standing Single Arm Shoulder Abduction with Resistance  - 1 x daily - 3 x weekly - 3 sets - 10 reps - Shoulder External Rotation and Scapular Retraction  - 1 x daily - 3 x weekly - 3 sets - 10 reps - Prone Single Arm Shoulder Y  - 1 x daily - 3 x weekly - 3 sets - 10 reps - Prone Shoulder Horizontal Abduction  - 1 x daily - 3 x weekly - 3 sets - 10 reps - Prone Shoulder Extension - Single Arm  - 1 x daily - 3 x weekly - 3 sets -  10 reps - Prone Shoulder Row  - 1 x daily - 3 x weekly - 3 sets - 10 reps - Seated Upper Trapezius Stretch  - 1 x daily - 7 x weekly - 3 sets - 10 reps - 30 hold - Gentle Levator Scapulae Stretch  - 1 x daily - 7 x weekly - 3 sets - 10 reps - 30 hold   ASSESSMENT:  CLINICAL IMPRESSION:  Patient presents with good motivation and repsonse to today's treatment. She is most limited with overehead and ER but functionally reporting using her arm with more daily activities. She is compensating less with much fewer VC for UT compensation. Pt will continue to benefit from skilled therapy to address remaining deficits in order to improve overall QoL and return to PLOF.  OBJECTIVE IMPAIRMENTS decreased activity tolerance, decreased mobility, decreased ROM, decreased strength, decreased safety awareness, hypomobility, impaired flexibility, impaired UE functional use, postural dysfunction, and pain.   ACTIVITY LIMITATIONS carrying, lifting, sleeping, bathing, dressing, reach over head, and hygiene/grooming  PARTICIPATION LIMITATIONS: meal prep, cleaning, laundry, driving, shopping, community activity, and yard work  PERSONAL FACTORS Behavior pattern and 1-2 comorbidities: COPD, Mult shoulder surgeries  are also affecting patient's functional outcome.   REHAB POTENTIAL: Good  CLINICAL DECISION MAKING: Evolving/moderate complexity  EVALUATION COMPLEXITY: Low   GOALS: Goals reviewed with patient? Yes  SHORT TERM GOALS: Target date:   Pt will be independent with initial HEP in order to improve strength and decrease pain in order to improve pain-free function at home and work. Baseline: 12/04/2021- No formal HEP in place; 02/25/2022- Patient reports doing her HEP currently everyday.  Goal status: GOAL MET   LONG TERM GOALS: Target date: 05/20/2022  Pt will be independent with Final HEP in order to improve strength and decrease pain in order to improve pain-free function at home and  work. Baseline: 12/04/2021- No formal HEP in place; 02/25/2022- Patient reports doing her HEP currently everyday.  Goal status: ONGOING  2.  Pt will decrease quick DASH score by at least 8% in order to demonstrate clinically significant reduction in disability. Baseline: 12/04/2021= 61; 9/14: 47.73 % Goal status: Goal Met  3.  Pt will improve FOTO to target score of 51% to display perceived improvements in ability to complete ADL's.  Baseline: 12/04/2021=40; 9/14: 54%  Goal status: Goal Met  4.  Pt will decrease worst pain as reported on NPRS by at least 3 points in order to demonstrate clinically significant reduction in pain. Baseline: 12/04/2021= 6/10 Right shoulder pain; 9/14 - 4/10 worst pain over past week; 02/25/2022= 3-4/10 right shoulder pain- Pain management includes ROM/stretching, ice,  Goal status: On-Going  5.  Patient will demo > 140 deg of right shoulder elevation (PROM) for improved ROM and overhead activities Baseline: 12/04/2021=PROM R shoulder flex- 118; 105 ABD; 9/14 - R Shoulder; 10 /19/2023= flex- 162 and ABD= 158 Goal status: MET  6.  Patient will demo > 100 deg of right shoulder elevation (AROM) for improved ROM and overhead activities Baseline: 12/04/2021=PROM R shoulder flex- 118; 105 ABD; 9/14 - R Shoulder Flexion: 142 deg; Abduction: 121 deg. Goal status: Goal Met  7.  Patient will be able to mop 2 rooms at home without increase in pain to demonstrate increased strength without pain.   Baseline: 01/21/2022 - Pt states she is only able to mop 1 room without pain and any more it jumps up to 4/10.; 02/25/2022= Patient able to mop multiple rooms without difficulty.  Goal status: GOAL MET  8.  Patient will demo > 150 deg of right shoulder elevation (AROM) for improved ROM and overhead activities Baseline: 02/25/2022- Right shoulder flex= 130 deg without UT compensation; Abd= 125 deg Goal status: Initial  9. Patient will transition from skilled PT setting for shoulder  rehab to home or gym based program to promote more independence and plan for future.   Baseline: Patient continues with outpatient PT requiring VC and assist with knowlegdge base and execution of prescribed exercises.  GOAL status: Initial    PLAN: PT FREQUENCY: 1-2x/week  PT DURATION: 12 weeks  PLANNED INTERVENTIONS: Therapeutic exercises, Therapeutic activity, Patient/Family education, Self Care, Joint mobilization, Dry Needling, Spinal manipulation, Spinal mobilization, Cryotherapy, Moist heat, scar mobilization, Taping, Ultrasound, and Manual therapy  PLAN FOR NEXT SESSION:  RUE  strengthening - resistive/AROM    Ollen Bowl, PT Physical Therapist- [facility]  03/26/22, 11:27 AM

## 2022-03-28 ENCOUNTER — Other Ambulatory Visit: Payer: Self-pay | Admitting: Family Medicine

## 2022-03-28 DIAGNOSIS — K219 Gastro-esophageal reflux disease without esophagitis: Secondary | ICD-10-CM

## 2022-03-28 DIAGNOSIS — E782 Mixed hyperlipidemia: Secondary | ICD-10-CM

## 2022-03-30 ENCOUNTER — Ambulatory Visit: Payer: Medicare Other

## 2022-03-30 DIAGNOSIS — Z96611 Presence of right artificial shoulder joint: Secondary | ICD-10-CM

## 2022-03-30 DIAGNOSIS — M25511 Pain in right shoulder: Secondary | ICD-10-CM

## 2022-03-30 DIAGNOSIS — R262 Difficulty in walking, not elsewhere classified: Secondary | ICD-10-CM | POA: Diagnosis not present

## 2022-03-30 DIAGNOSIS — M25619 Stiffness of unspecified shoulder, not elsewhere classified: Secondary | ICD-10-CM | POA: Diagnosis not present

## 2022-03-30 DIAGNOSIS — M6281 Muscle weakness (generalized): Secondary | ICD-10-CM

## 2022-03-30 NOTE — Therapy (Signed)
OUTPATIENT PHYSICAL THERAPY TREATMENT   Patient Name: Angela Fernandez MRN: [id number] DOB:1945-11-02, 76 y.o., female Today's Date: 03/30/2022   PT End of Session - 03/30/22 1310     Visit Number 27    Number of Visits 44    Date for PT Re-Evaluation 05/20/22    Authorization Type UHC Medicare    Authorization Time Period 12/04/2021- 02/26/2022; 10/19- 05/20/2022    Progress Note Due on Visit 30    PT Start Time 1310    PT Stop Time 1355    PT Time Calculation (min) 45 min    Activity Tolerance Patient tolerated treatment well;No increased pain    Behavior During Therapy WFL for tasks assessed/performed                   Past Medical History:  Diagnosis Date   Anemia    Arthritis    Bipolar affect, depressed ([place])    COPD (chronic obstructive pulmonary disease) (HCC)    Diabetes mellitus without complication (HCC)    type II   GERD (gastroesophageal reflux disease)    Hyperlipidemia    Hypertension    Hypothyroidism    PTSD (post-traumatic stress disorder)    Schizoaffective disorder ([place])    Sleep apnea    cpap   Stroke ([place])    hx of  mini stroke - 2001   TIA (transient ischemic attack) 2010   UTI (urinary tract infection)    Past Surgical History:  Procedure Laterality Date   BILATERAL CARPAL TUNNEL RELEASE Bilateral    BREAST BIOPSY     BREAST SURGERY Right    lumpectomy   CATARACT EXTRACTION W/ INTRAOCULAR LENS  IMPLANT, BILATERAL Bilateral    CHOLECYSTECTOMY     COLONOSCOPY     ESOPHAGOGASTRODUODENOSCOPY     EYE SURGERY     JOINT REPLACEMENT     NASAL SINUS SURGERY     REPLACEMENT TOTAL KNEE BILATERAL Bilateral    REVERSE SHOULDER ARTHROPLASTY Right 09/02/2020   Procedure: REVERSE SHOULDER ARTHROPLASTY;  Surgeon: Corky Mull, MD;  Location: [facility] ORS;  Service: Orthopedics;  Laterality: Right;   SHOULDER SURGERY Right 2020   rotator cuff repair   TOTAL SHOULDER REVISION Right 10/23/2021   Procedure: REVERSE SHOULDER REVISION;  Surgeon:  Nicholes Stairs, MD;  Location: [facility];  Service: Orthopedics;  Laterality: Right;  180   Patient Active Problem List   Diagnosis Date Noted   Hx of sleep apnea 12/21/2021   S/P reverse total shoulder arthroplasty, right 10/23/2021   Cystocele with rectocele 04/10/2021   Mixed urinary incontinence due to female genital prolapse 04/10/2021   Submucous leiomyoma of uterus 04/10/2021   Thickened endometrium 04/10/2021   Pelvic pressure in female 02/11/2021   DJD (degenerative joint disease) of cervical spine 12/28/2020   Anxiety, generalized 12/28/2020   History of depression 12/28/2020   Memory difficulty 12/28/2020   Chronic venous insufficiency 12/24/2020   Lymphedema 12/24/2020   Diabetes ([place]) 12/24/2020   Hyperlipidemia 12/24/2020   Essential hypertension 12/24/2020   Closed fracture of neck of right scapula 10/17/2020   Status post reverse total shoulder replacement, right 09/02/2020   Rotator cuff arthropathy, right 07/14/2020    PCP: Adrian Prows  REFERRING PROVIDER: Dr. Victorino December ([facility])  REFERRING DIAG: s/p Right Reverse Total shoulder arthroplasty (REVISION) 10/23/2021  THERAPY DIAG:  Muscle weakness (generalized)  Right shoulder pain, unspecified chronicity  Limited range of motion (ROM) of shoulder  S/P reverse total shoulder arthroplasty, right  Rationale for Evaluation and Treatment Rehabilitation   ONSET DATE: 10/23/2021   SUBJECTIVE:   Patient reports having some stomach issues today but feeling good with her shoulder.  PERTINENT HISTORY: Angela Fernandez is a 76 y.o. female who complains of right shoulder pain and dysfunction following a elective reverse arthroplasty performed over in [place] a number of months ago.  She had early failure of the baseplate with loosening and now malposition.  PAIN:  Are you having pain? Yes, 8/10 pain in the R foot from arthritis; No shoulder  PRECAUTIONS: Shoulder- in demographics  section   PATIENT GOALS  I want to be able to reach the top of head and also paint as well  OBJECTIVE:   TODAY'S TREATMENT: 03/30/22   TherEx: Arm ranger- RUE- flex x 20, scaption x 20 reps, horizontal ABD x 20 reps, Straight arm- sm diameter circles into larger diameter circles x 20+ revolutions.  Standing cable elbow extension 2x15, 12.5# Standing elbow flexion/biceps curl cable at 7.5#, 2x15,  Standing scapular retraction, 2x15 reps, RTB Standing resistive deltoid strengthening - ant/middle/posterior with RTB- 2 sets of 12 reps Standing Shoulder IR - RTB 2 sets of 12 reps Wall pushups, x12 Wiping wall with washcloth - sm. Diameter circles - CW, CCW x 12 reps each  Review of HEP as described below- Issued handout today.          PATIENT EDUCATION: Education details: Exercise technique; progression of HEP Person educated: Patient Education method: Explanation, Demonstration, Tactile cues, and Verbal cues Education comprehension: verbalized understanding, returned demonstration, verbal cues required, tactile cues required, and needs further education   HOME EXERCISE PROGRAM:  Access Code: [email or web address] URL: [email or web address] Date: 03/30/2022 Prepared by: Sande Brothers  Exercises - Seated Shoulder External Rotation AAROM with Cane and Hand in Neutral  - 1 x daily - 7 x weekly - 3 sets - 10 reps - Shoulder Flexion Overhead with Dowel  - 1 x daily - 7 x weekly - 3 sets - 10 reps - Shoulder Scaption AAROM with Dowel  - 1 x daily - 7 x weekly - 3 sets - 10 reps - Standing Shoulder Flexion with Resistance  - 1 x daily - 3 x weekly - 3 sets - 10 reps - Standing Single Arm Shoulder Abduction with Resistance  - 1 x daily - 3 x weekly - 3 sets - 10 reps - Single Arm Shoulder Extension with Resistance  - 1 x daily - 3 x weekly - 3 sets - 10 reps - Standing Row with Anchored Resistance  - 1 x daily - 3 x weekly - 3 sets - 10 reps   Access Code: [email or web address] URL:  [email or web address] Date: 02/18/2022 Prepared by: Amalia Hailey  Exercises - Standing Shoulder Flexion with Resistance  - 1 x daily - 3 x weekly - 3 sets - 10 reps - Standing Single Arm Shoulder Abduction with Resistance  - 1 x daily - 3 x weekly - 3 sets - 10 reps - Shoulder External Rotation and Scapular Retraction  - 1 x daily - 3 x weekly - 3 sets - 10 reps - Prone Single Arm Shoulder Y  - 1 x daily - 3 x weekly - 3 sets - 10 reps - Prone Shoulder Horizontal Abduction  - 1 x daily - 3 x weekly - 3 sets - 10 reps - Prone Shoulder Extension - Single Arm  - 1 x daily - 3 x weekly - 3 sets - 10 reps - Prone Shoulder Row  -  1 x daily - 3 x weekly - 3 sets - 10 reps - Seated Upper Trapezius Stretch  - 1 x daily - 7 x weekly - 3 sets - 10 reps - 30 hold - Gentle Levator Scapulae Stretch  - 1 x daily - 7 x weekly - 3 sets - 10 reps - 30 hold   ASSESSMENT:  CLINICAL IMPRESSION:  Patient presents with good motivation and no pain reported during today's session. Reviewed AAROM using a wand and patient reported feeling looser. She was able to demo good overall understanding of her current HEP. Discussed continuing PT and patient stated she would like a couple of weeks to practice on her own and see how she does then return in 2 weeks for a reassessment with plan to either continue PT vs. Potential discharge. Pt will continue to benefit from skilled therapy to address remaining deficits in order to improve overall QoL and return to PLOF.       OBJECTIVE IMPAIRMENTS decreased activity tolerance, decreased mobility, decreased ROM, decreased strength, decreased safety awareness, hypomobility, impaired flexibility, impaired UE functional use, postural dysfunction, and pain.   ACTIVITY LIMITATIONS carrying, lifting, sleeping, bathing, dressing, reach over head, and hygiene/grooming  PARTICIPATION LIMITATIONS: meal prep, cleaning, laundry, driving, shopping, community activity, and yard  work  PERSONAL FACTORS Behavior pattern and 1-2 comorbidities: COPD, Mult shoulder surgeries  are also affecting patient's functional outcome.   REHAB POTENTIAL: Good  CLINICAL DECISION MAKING: Evolving/moderate complexity  EVALUATION COMPLEXITY: Low   GOALS: Goals reviewed with patient? Yes  SHORT TERM GOALS: Target date:   Pt will be independent with initial HEP in order to improve strength and decrease pain in order to improve pain-free function at home and work. Baseline: 12/04/2021- No formal HEP in place; 02/25/2022- Patient reports doing her HEP currently everyday.  Goal status: GOAL MET   LONG TERM GOALS: Target date: 05/20/2022  Pt will be independent with Final HEP in order to improve strength and decrease pain in order to improve pain-free function at home and work. Baseline: 12/04/2021- No formal HEP in place; 02/25/2022- Patient reports doing her HEP currently everyday.  Goal status: ONGOING  2.  Pt will decrease quick DASH score by at least 8% in order to demonstrate clinically significant reduction in disability. Baseline: 12/04/2021= 61; 9/14: 47.73 % Goal status: Goal Met  3.  Pt will improve FOTO to target score of 51% to display perceived improvements in ability to complete ADL's.  Baseline: 12/04/2021=40; 9/14: 54%  Goal status: Goal Met  4.  Pt will decrease worst pain as reported on NPRS by at least 3 points in order to demonstrate clinically significant reduction in pain. Baseline: 12/04/2021= 6/10 Right shoulder pain; 9/14 - 4/10 worst pain over past week; 02/25/2022= 3-4/10 right shoulder pain- Pain management includes ROM/stretching, ice,  Goal status: On-Going  5.  Patient will demo > 140 deg of right shoulder elevation (PROM) for improved ROM and overhead activities Baseline: 12/04/2021=PROM R shoulder flex- 118; 105 ABD; 9/14 - R Shoulder; 10 /19/2023= flex- 162 and ABD= 158 Goal status: MET  6.  Patient will demo > 100 deg of right shoulder elevation  (AROM) for improved ROM and overhead activities Baseline: 12/04/2021=PROM R shoulder flex- 118; 105 ABD; 9/14 - R Shoulder Flexion: 142 deg; Abduction: 121 deg. Goal status: Goal Met  7.  Patient will be able to mop 2 rooms at home without increase in pain to demonstrate increased strength without pain.   Baseline: 01/21/2022 -  Pt states she is only able to mop 1 room without pain and any more it jumps up to 4/10.; 02/25/2022= Patient able to mop multiple rooms without difficulty.  Goal status: GOAL MET  8.  Patient will demo > 150 deg of right shoulder elevation (AROM) for improved ROM and overhead activities Baseline: 02/25/2022- Right shoulder flex= 130 deg without UT compensation; Abd= 125 deg Goal status: Initial  9. Patient will transition from skilled PT setting for shoulder rehab to home or gym based program to promote more independence and plan for future.   Baseline: Patient continues with outpatient PT requiring VC and assist with knowlegdge base and execution of prescribed exercises.  GOAL status: Initial    PLAN: PT FREQUENCY: 1-2x/week  PT DURATION: 12 weeks  PLANNED INTERVENTIONS: Therapeutic exercises, Therapeutic activity, Patient/Family education, Self Care, Joint mobilization, Dry Needling, Spinal manipulation, Spinal mobilization, Cryotherapy, Moist heat, scar mobilization, Taping, Ultrasound, and Manual therapy  PLAN FOR NEXT SESSION:  RUE strengthening - resistive/AROM    Ollen Bowl, PT Physical Therapist- [facility]  03/30/22, 2:22 PM

## 2022-03-31 DIAGNOSIS — J449 Chronic obstructive pulmonary disease, unspecified: Secondary | ICD-10-CM | POA: Diagnosis not present

## 2022-03-31 DIAGNOSIS — E1159 Type 2 diabetes mellitus with other circulatory complications: Secondary | ICD-10-CM | POA: Diagnosis not present

## 2022-03-31 DIAGNOSIS — Z794 Long term (current) use of insulin: Secondary | ICD-10-CM | POA: Diagnosis not present

## 2022-03-31 DIAGNOSIS — E785 Hyperlipidemia, unspecified: Secondary | ICD-10-CM | POA: Diagnosis not present

## 2022-03-31 DIAGNOSIS — E119 Type 2 diabetes mellitus without complications: Secondary | ICD-10-CM | POA: Diagnosis not present

## 2022-03-31 DIAGNOSIS — E039 Hypothyroidism, unspecified: Secondary | ICD-10-CM | POA: Diagnosis not present

## 2022-03-31 DIAGNOSIS — I10 Essential (primary) hypertension: Secondary | ICD-10-CM | POA: Diagnosis not present

## 2022-04-06 ENCOUNTER — Ambulatory Visit: Payer: Medicare Other

## 2022-04-07 ENCOUNTER — Ambulatory Visit: Payer: Medicare Other | Attending: Otolaryngology

## 2022-04-07 DIAGNOSIS — G4733 Obstructive sleep apnea (adult) (pediatric): Secondary | ICD-10-CM | POA: Insufficient documentation

## 2022-04-08 ENCOUNTER — Ambulatory Visit: Payer: Medicare Other | Admitting: Physical Therapy

## 2022-04-12 NOTE — Telephone Encounter (Signed)
Please advise her to discontinue Trazodone as she is on Ambien as well. I have not ordered this medication to her either. Thanks.

## 2022-04-13 ENCOUNTER — Other Ambulatory Visit (HOSPITAL_COMMUNITY): Payer: Self-pay | Admitting: Psychiatry

## 2022-04-15 ENCOUNTER — Encounter: Payer: Medicare Other | Admitting: Physical Therapy

## 2022-04-19 ENCOUNTER — Ambulatory Visit
Admission: RE | Admit: 2022-04-19 | Discharge: 2022-04-19 | Disposition: A | Discharge status: Home / Self Care | Payer: Medicare Other | Source: Ambulatory Visit | Attending: Infectious Diseases | Admitting: Infectious Diseases

## 2022-04-19 DIAGNOSIS — Z1231 Encounter for screening mammogram for malignant neoplasm of breast: Secondary | ICD-10-CM | POA: Insufficient documentation

## 2022-04-20 ENCOUNTER — Ambulatory Visit: Payer: Medicare Other | Attending: Psychiatry

## 2022-04-20 DIAGNOSIS — M25511 Pain in right shoulder: Secondary | ICD-10-CM | POA: Diagnosis not present

## 2022-04-20 DIAGNOSIS — Z96611 Presence of right artificial shoulder joint: Secondary | ICD-10-CM

## 2022-04-20 DIAGNOSIS — M25619 Stiffness of unspecified shoulder, not elsewhere classified: Secondary | ICD-10-CM | POA: Diagnosis not present

## 2022-04-20 DIAGNOSIS — M6281 Muscle weakness (generalized): Secondary | ICD-10-CM | POA: Diagnosis not present

## 2022-04-20 NOTE — Therapy (Unsigned)
OUTPATIENT PHYSICAL THERAPY TREATMENT/DISCHARGE SUMMARY   Patient Name: Angela Fernandez MRN: [id number] DOB:[birth year], 76 y.o., female Today's Date: 04/21/2022   PT End of Session - 04/20/22 1340     Visit Number 28    Number of Visits 44    Date for PT Re-Evaluation 05/20/22    Authorization Type UHC Medicare    Authorization Time Period 12/04/2021- 02/26/2022; 10/19- 05/20/2022    Progress Note Due on Visit 30    PT Start Time 4098    PT Stop Time 1418    PT Time Calculation (min) 33 min    Activity Tolerance Patient tolerated treatment well;No increased pain    Behavior During Therapy WFL for tasks assessed/performed                   Past Medical History:  Diagnosis Date   Anemia    Arthritis    Bipolar affect, depressed ([place])    COPD (chronic obstructive pulmonary disease) (HCC)    Diabetes mellitus without complication (HCC)    type II   GERD (gastroesophageal reflux disease)    Hyperlipidemia    Hypertension    Hypothyroidism    PTSD (post-traumatic stress disorder)    Schizoaffective disorder ([place])    Sleep apnea    cpap   Stroke ([place])    hx of  mini stroke - 2001   TIA (transient ischemic attack) 2010   UTI (urinary tract infection)    Past Surgical History:  Procedure Laterality Date   BILATERAL CARPAL TUNNEL RELEASE Bilateral    BREAST BIOPSY     BREAST SURGERY Right    lumpectomy   CATARACT EXTRACTION W/ INTRAOCULAR LENS  IMPLANT, BILATERAL Bilateral    CHOLECYSTECTOMY     COLONOSCOPY     ESOPHAGOGASTRODUODENOSCOPY     EYE SURGERY     JOINT REPLACEMENT     NASAL SINUS SURGERY     REPLACEMENT TOTAL KNEE BILATERAL Bilateral    REVERSE SHOULDER ARTHROPLASTY Right 09/02/2020   Procedure: REVERSE SHOULDER ARTHROPLASTY;  Surgeon: Corky Mull, MD;  Location: [facility] ORS;  Service: Orthopedics;  Laterality: Right;   SHOULDER SURGERY Right 2020   rotator cuff repair   TOTAL SHOULDER REVISION Right 10/23/2021   Procedure: REVERSE SHOULDER  REVISION;  Surgeon: Nicholes Stairs, MD;  Location: [facility];  Service: Orthopedics;  Laterality: Right;  180   Patient Active Problem List   Diagnosis Date Noted   Hx of sleep apnea 12/21/2021   S/P reverse total shoulder arthroplasty, right 10/23/2021   Cystocele with rectocele 04/10/2021   Mixed urinary incontinence due to female genital prolapse 04/10/2021   Submucous leiomyoma of uterus 04/10/2021   Thickened endometrium 04/10/2021   Pelvic pressure in female 02/11/2021   DJD (degenerative joint disease) of cervical spine 12/28/2020   Anxiety, generalized 12/28/2020   History of depression 12/28/2020   Memory difficulty 12/28/2020   Chronic venous insufficiency 12/24/2020   Lymphedema 12/24/2020   Diabetes ([place]) 12/24/2020   Hyperlipidemia 12/24/2020   Essential hypertension 12/24/2020   Closed fracture of neck of right scapula 10/17/2020   Status post reverse total shoulder replacement, right 09/02/2020   Rotator cuff arthropathy, right 07/14/2020    PCP: Adrian Prows  REFERRING PROVIDER: Dr. Victorino December ([facility])  REFERRING DIAG: s/p Right Reverse Total shoulder arthroplasty (REVISION) 10/23/2021  THERAPY DIAG:  Muscle weakness (generalized)  Right shoulder pain, unspecified chronicity  Limited range of motion (ROM) of shoulder  S/P reverse total shoulder arthroplasty, right  Rationale for Evaluation and Treatment Rehabilitation   ONSET DATE: 10/23/2021   SUBJECTIVE:    Patient reports doing very well- I have to be careful how much I try to lift with this arm but I am able do all my bathing and dressing on my own and trying to lift my arm as much as I can.   PERTINENT HISTORY: Angela Fernandez is a 76 y.o. female who complains of right shoulder pain and dysfunction following a elective reverse arthroplasty performed over in [place] a number of months ago.  She had early failure of the baseplate with loosening and now malposition.  PAIN:  Are you  having pain? Yes, 8/10 pain in the R foot from arthritis; No shoulder  PRECAUTIONS: Shoulder- in demographics section   PATIENT GOALS  I want to be able to reach the top of head and also paint as well  OBJECTIVE:   TODAY'S TREATMENT: 04/21/22   TherEx: Arm ranger- RUE- flex x 20, scaption x 20 reps, horizontal ABD x 20 reps, Straight arm- sm diameter circles into larger diameter circles x 20+ revolutions.  Wall pushups, x12 Wiping wall with washcloth - sm. Diameter circles - CW, CCW x 12 reps each  Review of cane assisted shoulder ROM- Flex/ABD/ER x 30 each AAROM seated right UE shoulder flex with arm on table- lean forward to desired ROM   Standing ER/chest stretch in doorway - hold 20 sec x 3 each.        PATIENT EDUCATION: Education details: Exercise technique; progression of HEP Person educated: Patient Education method: Explanation, Demonstration, Tactile cues, and Verbal cues Education comprehension: verbalized understanding, returned demonstration, verbal cues required, tactile cues required, and needs further education   HOME EXERCISE PROGRAM:  Access Code: [email or web address] URL: [email or web address] Date: 03/30/2022 Prepared by: Sande Brothers  Exercises - Seated Shoulder External Rotation AAROM with Cane and Hand in Neutral  - 1 x daily - 7 x weekly - 3 sets - 10 reps - Shoulder Flexion Overhead with Dowel  - 1 x daily - 7 x weekly - 3 sets - 10 reps - Shoulder Scaption AAROM with Dowel  - 1 x daily - 7 x weekly - 3 sets - 10 reps - Standing Shoulder Flexion with Resistance  - 1 x daily - 3 x weekly - 3 sets - 10 reps - Standing Single Arm Shoulder Abduction with Resistance  - 1 x daily - 3 x weekly - 3 sets - 10 reps - Single Arm Shoulder Extension with Resistance  - 1 x daily - 3 x weekly - 3 sets - 10 reps - Standing Row with Anchored Resistance  - 1 x daily - 3 x weekly - 3 sets - 10 reps   Access Code: [email or web address] URL:  [email or web address] Date: 02/18/2022 Prepared by: Amalia Hailey  Exercises - Standing Shoulder Flexion with Resistance  - 1 x daily - 3 x weekly - 3 sets - 10 reps - Standing Single Arm Shoulder Abduction with Resistance  - 1 x daily - 3 x weekly - 3 sets - 10 reps - Shoulder External Rotation and Scapular Retraction  - 1 x daily - 3 x weekly - 3 sets - 10 reps - Prone Single Arm Shoulder Y  - 1 x daily - 3 x weekly - 3 sets - 10 reps - Prone Shoulder Horizontal Abduction  - 1 x daily - 3 x weekly - 3 sets - 10 reps - Prone Shoulder Extension - Single Arm  - 1  x daily - 3 x weekly - 3 sets - 10 reps - Prone Shoulder Row  - 1 x daily - 3 x weekly - 3 sets - 10 reps - Seated Upper Trapezius Stretch  - 1 x daily - 7 x weekly - 3 sets - 10 reps - 30 hold - Gentle Levator Scapulae Stretch  - 1 x daily - 7 x weekly - 3 sets - 10 reps - 30 hold   ASSESSMENT:  CLINICAL IMPRESSION:  Patient presents with good motivation and no pain reported during today's session. Patient returns to clinic after practicing her HEP for 2 weeks. She reports and today able to demo good independent knowledge of ROM and strengthening exercise. She requested review of cane assisted ROM exercises and performed well - reminders to perform these daily to improve/maintain her ROM. She is functioning at an independent level and able to raise her arm slightly overhead. She has met all goals except her ROM goal but much improved overall from initiation of services. Patient appropriate for discharge from outpatient PT setting and to transition to home based exercise program at this time. Patient in agreement with plan for discharge today.     OBJECTIVE IMPAIRMENTS decreased activity tolerance, decreased mobility, decreased ROM, decreased strength, decreased safety awareness, hypomobility, impaired flexibility, impaired UE functional use, postural dysfunction, and pain.   ACTIVITY LIMITATIONS carrying, lifting,  sleeping, bathing, dressing, reach over head, and hygiene/grooming  PARTICIPATION LIMITATIONS: meal prep, cleaning, laundry, driving, shopping, community activity, and yard work  PERSONAL FACTORS Behavior pattern and 1-2 comorbidities: COPD, Mult shoulder surgeries  are also affecting patient's functional outcome.   REHAB POTENTIAL: Good  CLINICAL DECISION MAKING: Evolving/moderate complexity  EVALUATION COMPLEXITY: Low   GOALS: Goals reviewed with patient? Yes  SHORT TERM GOALS: Target date:   Pt will be independent with initial HEP in order to improve strength and decrease pain in order to improve pain-free function at home and work. Baseline: 12/04/2021- No formal HEP in place; 02/25/2022- Patient reports doing her HEP currently everyday.  Goal status: GOAL MET   LONG TERM GOALS: Target date: 05/20/2022  Pt will be independent with Final HEP in order to improve strength and decrease pain in order to improve pain-free function at home and work. Baseline: 12/04/2021- No formal HEP in place; 02/25/2022- Patient reports doing her HEP currently everyday. 04/20/2022= Patient reports doing "pretty good" with my exercises and states very compliant.  Goal status: GOAL MET  2.  Pt will decrease quick DASH score by at least 8% in order to demonstrate clinically significant reduction in disability. Baseline: 12/04/2021= 61; 9/14: 47.73 % Goal status: Goal Met  3.  Pt will improve FOTO to target score of 51% to display perceived improvements in ability to complete ADL's.  Baseline: 12/04/2021=40; 9/14: 54%; 04/20/2022=65% Goal status: Goal Met  4.  Pt will decrease worst pain as reported on NPRS by at least 3 points in order to demonstrate clinically significant reduction in pain. Baseline: 12/04/2021= 6/10 Right shoulder pain; 9/14 - 4/10 worst pain over past week; 02/25/2022= 3-4/10 right shoulder pain- Pain management includes ROM/stretching, ice; 04/20/2022= Patient denies any right  shoulder pain at this time.  Goal status: MET  5.  Patient will demo > 140 deg of right shoulder elevation (PROM) for improved ROM and overhead activities Baseline: 12/04/2021=PROM R shoulder flex- 118; 105 ABD; 9/14 - R Shoulder; 10 /19/2023= flex- 162 and ABD= 158 Goal status: MET  6.  Patient will demo > 100  deg of right shoulder elevation (AROM) for improved ROM and overhead activities Baseline: 12/04/2021=PROM R shoulder flex- 118; 105 ABD; 9/14 - R Shoulder Flexion: 142 deg; Abduction: 121 deg. Goal status: Goal Met  7.  Patient will be able to mop 2 rooms at home without increase in pain to demonstrate increased strength without pain.   Baseline: 01/21/2022 - Pt states she is only able to mop 1 room without pain and any more it jumps up to 4/10.; 02/25/2022= Patient able to mop multiple rooms without difficulty.  Goal status: GOAL MET  8.  Patient will demo > 150 deg of right shoulder elevation (AROM) for improved ROM and overhead activities Baseline: 02/25/2022- Right shoulder flex= 130 deg without UT compensation; Abd= 125 deg; 04/20/2022= Right shoulder flex= 133 deg without UT compensation; Abd= 126 deg Goal status: GOAL NOT MET  9. Patient will transition from skilled PT setting for shoulder rehab to home or gym based program to promote more independence and plan for future.   Baseline: Patient continues with outpatient PT requiring VC and assist with knowlegdge base and execution of prescribed exercises. 04/20/2022=  GOAL status: GOAL MET    PLAN: PT FREQUENCY: 1-2x/week  PT DURATION: 12 weeks  PLANNED INTERVENTIONS: Therapeutic exercises, Therapeutic activity, Patient/Family education, Self Care, Joint mobilization, Dry Needling, Spinal manipulation, Spinal mobilization, Cryotherapy, Moist heat, scar mobilization, Taping, Ultrasound, and Manual therapy  PLAN FOR NEXT SESSION:  RUE strengthening - resistive/AROM    Ollen Bowl, PT Physical Therapist- [facility]  04/21/22, 8:20 AM

## 2022-04-22 ENCOUNTER — Ambulatory Visit: Payer: Medicare Other

## 2022-04-22 NOTE — Telephone Encounter (Signed)
Called and left another message for patient to call back.  Will closed message

## 2022-04-23 ENCOUNTER — Telehealth: Payer: Self-pay

## 2022-04-23 NOTE — Telephone Encounter (Signed)
pt called left message that she is out of the trazodone and she needs a refill.Marland Kitchen

## 2022-04-23 NOTE — Telephone Encounter (Signed)
Please advise her to discontinue trazodone to avoid risk of fall especially given she is also on Ambien. I have not ordered this medication to her either.

## 2022-04-27 ENCOUNTER — Ambulatory Visit: Payer: Medicare Other

## 2022-04-29 ENCOUNTER — Ambulatory Visit: Payer: Medicare Other

## 2022-05-04 ENCOUNTER — Ambulatory Visit: Payer: Medicare Other

## 2022-05-05 ENCOUNTER — Ambulatory Visit (INDEPENDENT_AMBULATORY_CARE_PROVIDER_SITE_OTHER): Payer: Medicare Other | Admitting: Licensed Clinical Social Worker

## 2022-05-05 DIAGNOSIS — F411 Generalized anxiety disorder: Secondary | ICD-10-CM

## 2022-05-05 DIAGNOSIS — F259 Schizoaffective disorder, unspecified: Secondary | ICD-10-CM | POA: Diagnosis not present

## 2022-05-05 NOTE — Telephone Encounter (Signed)
spoke with patient she states that she not [place] the trazodone any more but she taking the [place]. she states that she takes a nap at 4:00 for about a hour. she was advised to take her a 30 minute nap around 12 or 12:30. and that dr. Modesta Messing and dr. Shea Evans will be notified.

## 2022-05-05 NOTE — Telephone Encounter (Signed)
Noted  
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
## 2022-05-06 ENCOUNTER — Ambulatory Visit: Payer: Medicare Other

## 2022-05-11 ENCOUNTER — Ambulatory Visit: Payer: Medicare Other

## 2022-05-13 ENCOUNTER — Ambulatory Visit: Payer: Medicare Other

## 2022-05-14 DIAGNOSIS — Z794 Long term (current) use of insulin: Secondary | ICD-10-CM | POA: Diagnosis not present

## 2022-05-14 DIAGNOSIS — Z96652 Presence of left artificial knee joint: Secondary | ICD-10-CM | POA: Diagnosis not present

## 2022-05-14 DIAGNOSIS — E1159 Type 2 diabetes mellitus with other circulatory complications: Secondary | ICD-10-CM | POA: Diagnosis not present

## 2022-05-18 ENCOUNTER — Ambulatory Visit: Payer: Medicare Other

## 2022-05-20 ENCOUNTER — Ambulatory Visit: Payer: Medicare Other

## 2022-05-21 DIAGNOSIS — M216X1 Other acquired deformities of right foot: Secondary | ICD-10-CM | POA: Diagnosis not present

## 2022-05-21 DIAGNOSIS — M898X9 Other specified disorders of bone, unspecified site: Secondary | ICD-10-CM | POA: Diagnosis not present

## 2022-05-21 DIAGNOSIS — M205X1 Other deformities of toe(s) (acquired), right foot: Secondary | ICD-10-CM | POA: Diagnosis not present

## 2022-05-21 DIAGNOSIS — M79671 Pain in right foot: Secondary | ICD-10-CM | POA: Diagnosis not present

## 2022-05-21 DIAGNOSIS — M216X2 Other acquired deformities of left foot: Secondary | ICD-10-CM | POA: Diagnosis not present

## 2022-05-21 DIAGNOSIS — E119 Type 2 diabetes mellitus without complications: Secondary | ICD-10-CM | POA: Diagnosis not present

## 2022-05-21 DIAGNOSIS — M19072 Primary osteoarthritis, left ankle and foot: Secondary | ICD-10-CM | POA: Diagnosis not present

## 2022-05-21 DIAGNOSIS — M19071 Primary osteoarthritis, right ankle and foot: Secondary | ICD-10-CM | POA: Diagnosis not present

## 2022-05-21 DIAGNOSIS — B351 Tinea unguium: Secondary | ICD-10-CM | POA: Diagnosis not present

## 2022-05-21 DIAGNOSIS — M2041 Other hammer toe(s) (acquired), right foot: Secondary | ICD-10-CM | POA: Diagnosis not present

## 2022-05-21 DIAGNOSIS — M79674 Pain in right toe(s): Secondary | ICD-10-CM | POA: Diagnosis not present

## 2022-05-21 DIAGNOSIS — M2042 Other hammer toe(s) (acquired), left foot: Secondary | ICD-10-CM | POA: Diagnosis not present
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
## 2022-05-24 ENCOUNTER — Ambulatory Visit: Payer: 59 | Admitting: Psychiatry

## 2022-05-25 ENCOUNTER — Ambulatory Visit: Payer: Medicare Other

## 2022-05-27 ENCOUNTER — Ambulatory Visit: Payer: Medicare Other

## 2022-06-01 ENCOUNTER — Ambulatory Visit: Payer: Medicare Other

## 2022-06-01 DIAGNOSIS — M5442 Lumbago with sciatica, left side: Secondary | ICD-10-CM | POA: Diagnosis not present

## 2022-06-01 DIAGNOSIS — M9931 Osseous stenosis of neural canal of cervical region: Secondary | ICD-10-CM | POA: Diagnosis not present

## 2022-06-01 DIAGNOSIS — M5441 Lumbago with sciatica, right side: Secondary | ICD-10-CM | POA: Diagnosis not present

## 2022-06-01 DIAGNOSIS — M48062 Spinal stenosis, lumbar region with neurogenic claudication: Secondary | ICD-10-CM | POA: Diagnosis not present

## 2022-06-01 DIAGNOSIS — G8929 Other chronic pain: Secondary | ICD-10-CM | POA: Diagnosis not present

## 2022-06-01 DIAGNOSIS — M5412 Radiculopathy, cervical region: Secondary | ICD-10-CM | POA: Diagnosis not present

## 2022-06-01 DIAGNOSIS — M542 Cervicalgia: Secondary | ICD-10-CM | POA: Diagnosis not present

## 2022-06-02 ENCOUNTER — Ambulatory Visit (INDEPENDENT_AMBULATORY_CARE_PROVIDER_SITE_OTHER): Payer: 59 | Admitting: Licensed Clinical Social Worker

## 2022-06-02 DIAGNOSIS — F259 Schizoaffective disorder, unspecified: Secondary | ICD-10-CM

## 2022-06-02 DIAGNOSIS — F411 Generalized anxiety disorder: Secondary | ICD-10-CM

## 2022-06-02 DIAGNOSIS — F4310 Post-traumatic stress disorder, unspecified: Secondary | ICD-10-CM | POA: Diagnosis not present

## 2022-06-02 NOTE — Progress Notes (Addendum)
THERAPIST PROGRESS NOTE  Session Time: 9:37am- 10:27am   Participation Level: Active  Behavioral Response: Well GroomedAlertDepressed  Type of Therapy: Individual Therapy  Treatment Goals addressed: Anxiety: Reduce overall frequency, intensity and duration of anxiety so that daily functioning is not impaired per pt self report 3 out of 5 sessions.    Depression: Reduce overall frequency, intensity and duration of depression so that daily functioning is not impaired per pt self report 3 out of 5 sessions documented.    Intervention: Learn and Implement coping skills that result in the reduction of anxiety and worry and improve daily functioning per pt self report 3 out of 5 documented sessions.    Intervention: Will work with pt to learn and implement calming skills to reduce overall depression and manage depression symptoms. Some of the techniques that will be used during session will be deep breathing exercises, visual imagery, progressive muscle relaxation, postreduction relative to the benefits of self-care and other breathing exercises.    ProgressTowards Goals: Progressing  Interventions: CBT, Supportive, and Meditation: Mindful Deep Breathing   Pt presented in person at [facility] office. Pt and LCSW were present during the visit.    Summary: Angela Fernandez is a 77 y.o. female who presents with continuing symptoms of anxiety and depression. Pt reported that she has been trying to find new ways to cope with her symptoms of depression and anxiety. Pt reported that she misses living in [place] and that she misses her friends. Pt reported that she has liked living with her granddaughter more and that she has made a friend from going to the [facility]. Pt reported that her mood on the days that she goes to the [facility] are good and that she has been trying to start doing yoga again.  Pt reported that she did yoga in the past and it was helpful. In session pt was abel  to explore how using mindful deep breathing and square breathing technique could be helpful in alleviating symptoms of anxiety. Pt reported that taking deep breathes were helpful and that she would continue to utilize the technique outside of the session.   Allowed pt to explore thoughts and feelings associated with life situations and external stressors. Encouraged expression of feelings and used empathic listening. Pt appeared to be oriented to time, place and situation. LCSW validated the pts feelings and thoughts and showed unconditional positive regard.   Completed safety plan on 05/05/2022 with the pt in session. Pt reported that she does have passive thought of wishing to be dead and stated that she has no intent and will not act on those thoughts. Pt reported that she uses distraction such as taking a walk, watching television or listening to music when she has negative thoughts.Pt reported that her SI has become less intense and that she agrees to contact emergency resources if any worsening in SI.  Pt agree that she would go the emergency room if symptoms worsen. Pt stated that she has felt a lot better over the last few weeks and that she has social support from the [facility].Pt stated that her mood has been better over the last few weeks. Pt stated that she has been going to the [employer] and the swimming class and that she has met a friend named Arbie Cookey that she enjoyed talking to and that it was nice getting out of her home. Pt stated that she will continue to use her social support and that she has liked it better  since she has been living with her granddaughter. Protective risk factors include social support, healthy coping skills to include exercise and mindfulness deep breathing.   Pt was able to explore healthy coping skills and stated that she will continue to try and use healthy coping skills such as taking a walk and art. Pt stated that she enjoys being in nature and that she finds it calming.   Pt  denies A/V hallucinations during the session. Pt was cooperative during visit and was engaged throughout the visit. Pt does not report any other concerns at the time of visit.   Encouraged pt to take medications as prescribed by their psychiatrist.    Suicidal/Homicidal: Yeswithout intent/plan  Therapist Response: Educated on mindful deep breathing with the pt. Explored how the technique could help alleviate anxiety. Engaged with the pt to inhale for four seconds, then hold their air in their lungs for four seconds and then slowly exhale for six seconds. Encouraged the pt to practice the mindful deep breathing at home or any place where they feel that they need to take a few minutes to focus on breathing and being in the moment. Discussed with the pt how the technique could be effective in helping with anxiety and is a discreet way to help them stay grounded.   Educated on the importance of healthy coping skills with the pt. Explored the benefits of healthy coping skills and engaged the pt to discuss ways that they are coping with anxiety and depression. Encouraged the pt to explore new ways to help alleviate anxiety and determined new ways to help with distraction and coping in session. Discussed the benefits of exercise as a way of coping with anxiety and stress. Explored different ways that the pt could cope to include listening to music, being in nature, trying a new hobby and other activities that the pt could try to help with coping.    Continued Recommendations as followed: Self-care behaviors, positive social engagements, focusing on positive physical and emotional wellness, and focusing on life/work balance.     Plan: Return again on 06/16/2022. Discussed with the pt the transition of a new therapist and provided the pt with the choice of continuing services with the new therapist and answered any questions that the pt had about the transition.  Pt agreed to visits with the new therapist.     Diagnosis:  Encounter Diagnoses  Name Primary?   PTSD (post-traumatic stress disorder) Yes   Anxiety state    Schizoaffective disorder, unspecified type ([place])      Collaboration of Care: Pt encouraged to continue care with psychiatrist of record Dr. Modesta Messing.    Patient/Guardian was advised Release of Information must be obtained prior to any record release in order to collaborate their care with an outside provider. Patient/Guardian was advised if they have not already done so to contact the registration department to sign all necessary forms in order for [place] to release information regarding their care.   Consent: Patient/Guardian gives verbal consent for treatment and assignment of benefits for services provided during this visit. Patient/Guardian expressed understanding and agreed to proceed.   Lorenda Hatchet 06/02/2022

## 2022-06-03 ENCOUNTER — Ambulatory Visit: Payer: Medicare Other

## 2022-06-06 ENCOUNTER — Other Ambulatory Visit: Payer: Self-pay | Admitting: Psychiatry

## 2022-06-07 ENCOUNTER — Other Ambulatory Visit: Payer: Self-pay | Admitting: Psychiatry

## 2022-06-08 ENCOUNTER — Ambulatory Visit: Payer: Medicare Other

## 2022-06-10 ENCOUNTER — Ambulatory Visit: Payer: Medicare Other

## 2022-06-15 ENCOUNTER — Telehealth: Payer: Self-pay

## 2022-06-15 ENCOUNTER — Other Ambulatory Visit: Payer: Self-pay | Admitting: Psychiatry

## 2022-06-15 ENCOUNTER — Ambulatory Visit: Payer: Medicare Other

## 2022-06-15 DIAGNOSIS — E119 Type 2 diabetes mellitus without complications: Secondary | ICD-10-CM | POA: Diagnosis not present

## 2022-06-15 DIAGNOSIS — M48062 Spinal stenosis, lumbar region with neurogenic claudication: Secondary | ICD-10-CM | POA: Diagnosis not present

## 2022-06-15 MED ORDER — CLONAZEPAM 1 MG PO TABS: ORAL_TABLET | ORAL | 0 refills | Status: DC

## 2022-06-15 NOTE — Telephone Encounter (Signed)
pt called states that her daughter does her pill box and that she is going out of town and she will not have enough to get her box done before she comes back. please refill klonopin so she can fix her boxes while her daughter is away.

## 2022-06-15 NOTE — Telephone Encounter (Signed)
The order has been sent.  - Please contact the pharmacy to cancel the other order sent in Jan.

## 2022-06-16 ENCOUNTER — Ambulatory Visit (INDEPENDENT_AMBULATORY_CARE_PROVIDER_SITE_OTHER): Payer: 59 | Admitting: Licensed Clinical Social Worker

## 2022-06-16 DIAGNOSIS — F4310 Post-traumatic stress disorder, unspecified: Secondary | ICD-10-CM | POA: Diagnosis not present

## 2022-06-16 DIAGNOSIS — F411 Generalized anxiety disorder: Secondary | ICD-10-CM | POA: Diagnosis not present

## 2022-06-16 DIAGNOSIS — F259 Schizoaffective disorder, unspecified: Secondary | ICD-10-CM | POA: Diagnosis not present

## 2022-06-16 NOTE — Progress Notes (Signed)
THERAPIST PROGRESS NOTE  Session Time: 10:00am-10:38am    Participation Level: Active  Behavioral Response: Well GroomedAlertDepressed  Type of Therapy: Individual Therapy  Treatment Goals addressed: Anxiety: Reduce overall frequency, intensity and duration of anxiety so that daily functioning is not impaired per pt self report 3 out of 5 sessions.    Depression: Reduce overall frequency, intensity and duration of depression so that daily functioning is not impaired per pt self report 3 out of 5 sessions documented.    Intervention: Learn and Implement coping skills that result in the reduction of anxiety and worry and improve daily functioning per pt self report 3 out of 5 documented sessions.    Intervention: Will work with pt to learn and implement calming skills to reduce overall depression and manage depression symptoms. Some of the techniques that will be used during session will be deep breathing exercises, visual imagery, progressive muscle relaxation, postreduction relative to the benefits of self-care and other breathing exercises.    ProgressTowards Goals: Progressing  PHQ-9 score on 06/02/2022 was a 14 and on 06/16/2022 PHQ-9 score was a 13.  GAD-7 score on 06/02/2202 was a 14 and on 06/16/2022 GAD-7 score was an 11.   Interventions: CBT and Supportive  Pt presented in person at [facility] office. Pt and LCSW were present during the visit.    Summary: Angela Fernandez is a 77 y.o. female who presents with continuing symptoms of anxiety and depression. Pt reported that she has been trying to find new ways to cope with her symptoms of depression and anxiety. Pt reported that she has liked living with her granddaughter more and that she has made a friend from going to the [facility]. Pt reported that her mood on the days that she goes to the [facility] are good and that she enjoys going to the [facility] and that she is planning on going more often.   Pt reported that she  has been using mindful deep breathing since the last visit and that has been helpful. Pt stated that she was able to spend a whole day with her granddaughter this past week and that they went shopping and that she enjoyed her time with her and that she been trying to have more conversations with her granddaughter.   Allowed pt to explore thoughts and feelings associated with life situations and external stressors. Encouraged expression of feelings and used empathic listening. Pt appeared to be oriented to time, place and situation. LCSW validated the pts feelings and thoughts and showed unconditional positive regard.   Completed safety plan on 05/05/2022 with the pt in session. Reviewed with the pt her safety plan in session on 06/16/2022.  Pt reported that she does have passive thought of wishing to be dead from time to time and stated that she has no intent and will not act on those thoughts. Pt reported that she uses distraction such as taking a walk, watching television or listening to music when she has negative thoughts.Pt reported that her SI has become less intense and that she agrees to contact emergency resources if any worsening in SI.  Pt agreed that she would go the emergency room if symptoms worsen. Pt stated that she has felt a lot better over the last few weeks and that she has social support from the [facility]. Pt stated that her mood has been better since she has been taking time for herself to go to the [facility] and be involved in the classes that they  offer.   Pt stated that her mood has been "better" over the last few weeks. Pt stated that she has been going to the [facility] and the swimming class and that she has met a friend named Arbie Cookey that she enjoyed talking to and that it was nice getting out of her home. Pt stated that she will continue to use her social support and that she has liked it better since she has been living with her granddaughter. Protective risk factors include social support, healthy  coping skills to include exercise and mindfulness deep breathing, yoga and art. Pt was able to explore her social support in session and stated that she has support from the [facility] and that she has a friend that was her yoga instructor in [place] that she has been able to talk to.   Pt was able to explore healthy coping skills that she has been utilizing since the last visit and stated that going to [facility] has been helpful for her. Pt stated that she is going to continue to do activities that bring her joy and that she will continue to utilize self-care. Pt stated that she wants to get back into painting and that she enjoys yoga.   Pt denies A/V hallucinations during the session. Pt was cooperative during visit and was engaged throughout the visit. Pt does not report any other concerns at the time of visit.   Encouraged pt to take medications as prescribed by their psychiatrist.    Continued Recommendations as followed: Self-care behaviors, positive social engagements, focusing on positive physical and emotional wellness, and focusing on life/work balance.    Pt is continuing to apply interventions/techniques learned in session into daily life situations. Pt is currently on track to meet goals utilizing interventions that are discussed in session. Treatment to continue as indicated. Personal growth and progress toward goals noted above.   Suicidal/Homicidal: Yeswithout intent/plan  Therapist Response: Explored with pt how one important way to regain emotional health is to develop and utilize healthy social supports. Engaged the pt to explore who their support system is to include any family members, friends, other resources or social groups that they are part of feel and important to. Explored that the support must be someone or a group that they trust and affirm their individuality. Encouraged the pt to explore any barriers that stand in the way in developing their support system to include: difficult  time reaching out to others, low self-esteem, hard time making and keeping friends and any other barriers that prevent them from having social supports. Explored what the pt feels that they need and want from their support system and empowered the pt to commit themselves to develop a support sytem and to not give up on developing and utilizing their healthy support system. Explored with the pt how it makes them feel to be supportive and looked at how they can look for new supports and resources to help them with providing positive support.    Encouraged the pt to participate in activities in which they will experience success and achievement. Explored with the pt distraction as a coping mechanism and explored cognitive distractions, behavioral distraction and physiological distractions. Provided pt with examples of each form of distractions and explored with pt new ways that they could implement the distractions into their daily routine when they face difficult emotions and stressors.   LCSW used CBT techniques to explore how thoughts,feelings and beliefs impact behavioral responses and physical symptoms. Explored how stressors can  lead to negative thoughts that can be irrational or exaggerated which in return impacts how the person feels and can negatively impact how their body responds to include fatigue, sleep problems, poor concentration and loss of motivation. Explored how a behavioral response can create new stressors. Engaged with the pt to explore how their thoughts and feelings impact their behaviors and choices.      Plan: Discussed with the pt the transition of a new therapist and provided the pt with the choice of continuing services with the new therapist and answered any questions that the pt had about the transition.  Pt agreed to the transition to [employer].   Diagnosis:  Encounter Diagnoses  Name Primary?   PTSD (post-traumatic stress disorder) Yes   Schizoaffective disorder,  unspecified type ([place])    Anxiety state        Collaboration of Care: Pt encouraged to continue care with psychiatrist of record Dr. Modesta Messing.    Patient/Guardian was advised Release of Information must be obtained prior to any record release in order to collaborate their care with an outside provider. Patient/Guardian was advised if they have not already done so to contact the registration department to sign all necessary forms in order for [place] to release information regarding their care.   Consent: Patient/Guardian gives verbal consent for treatment and assignment of benefits for services provided during this visit. Patient/Guardian expressed understanding and agreed to proceed.   Lorenda Hatchet 06/16/2022

## 2022-06-16 NOTE — Telephone Encounter (Signed)
Pharmacy notified.

## 2022-06-17 ENCOUNTER — Ambulatory Visit: Payer: Medicare Other

## 2022-06-22 ENCOUNTER — Ambulatory Visit: Payer: Medicare Other

## 2022-06-24 ENCOUNTER — Ambulatory Visit: Payer: Medicare Other
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
## 2022-06-27 ENCOUNTER — Other Ambulatory Visit: Payer: Self-pay | Admitting: Psychiatry

## 2022-06-28 ENCOUNTER — Other Ambulatory Visit: Payer: Self-pay | Admitting: Psychiatry

## 2022-06-29 ENCOUNTER — Ambulatory Visit: Payer: Medicare Other

## 2022-06-29 ENCOUNTER — Ambulatory Visit (INDEPENDENT_AMBULATORY_CARE_PROVIDER_SITE_OTHER): Payer: 59 | Admitting: Psychiatry

## 2022-06-29 ENCOUNTER — Encounter: Payer: Self-pay | Admitting: Psychiatry

## 2022-06-29 VITALS — BP 148/67 | HR 78 | Temp 97.8°F | Ht 65.5 in | Wt 196.6 lb

## 2022-06-29 DIAGNOSIS — F259 Schizoaffective disorder, unspecified: Secondary | ICD-10-CM

## 2022-06-29 DIAGNOSIS — F4310 Post-traumatic stress disorder, unspecified: Secondary | ICD-10-CM

## 2022-06-29 DIAGNOSIS — F411 Generalized anxiety disorder: Secondary | ICD-10-CM

## 2022-06-29 MED ORDER — LURASIDONE HCL 120 MG PO TABS: 120.0000 mg | ORAL_TABLET | Freq: Every day | ORAL | 5 refills | Status: DC

## 2022-06-29 MED ORDER — BUPROPION HCL ER (SR) 200 MG PO TB12: 200.0000 mg | ORAL_TABLET | Freq: Two times a day (BID) | ORAL | 5 refills | Status: DC

## 2022-06-29 MED ORDER — DESVENLAFAXINE ER 50 MG PO TB24: 1.0000 | ORAL_TABLET | Freq: Every day | ORAL | 5 refills | Status: DC

## 2022-06-29 MED ORDER — CLONAZEPAM 1 MG PO TABS: ORAL_TABLET | ORAL | 1 refills | Status: DC

## 2022-06-29 NOTE — Patient Instructions (Signed)
Continue Pristiq 50 mg every day Continue bupropion 200 mg twice a day Continue Latuda 120 mg at night  Continue clonazepam 1 mg twice a day, and 0.5 mg at night  Continue Ambien 5 mg at night as needed for insomnia Next appointment: 4/22 at 1:30,

## 2022-06-30 DIAGNOSIS — M9931 Osseous stenosis of neural canal of cervical region: Secondary | ICD-10-CM | POA: Diagnosis not present

## 2022-06-30 DIAGNOSIS — M5442 Lumbago with sciatica, left side: Secondary | ICD-10-CM | POA: Diagnosis not present

## 2022-06-30 DIAGNOSIS — G8929 Other chronic pain: Secondary | ICD-10-CM | POA: Diagnosis not present

## 2022-06-30 DIAGNOSIS — M48062 Spinal stenosis, lumbar region with neurogenic claudication: Secondary | ICD-10-CM | POA: Diagnosis not present

## 2022-06-30 DIAGNOSIS — M5412 Radiculopathy, cervical region: Secondary | ICD-10-CM | POA: Diagnosis not present

## 2022-06-30 DIAGNOSIS — M5441 Lumbago with sciatica, right side: Secondary | ICD-10-CM | POA: Diagnosis not present

## 2022-06-30 DIAGNOSIS — M542 Cervicalgia: Secondary | ICD-10-CM | POA: Diagnosis not present

## 2022-07-01 ENCOUNTER — Ambulatory Visit: Payer: Medicare Other

## 2022-07-01 DIAGNOSIS — Z794 Long term (current) use of insulin: Secondary | ICD-10-CM | POA: Diagnosis not present

## 2022-07-01 DIAGNOSIS — J449 Chronic obstructive pulmonary disease, unspecified: Secondary | ICD-10-CM | POA: Diagnosis not present

## 2022-07-01 DIAGNOSIS — R1310 Dysphagia, unspecified: Secondary | ICD-10-CM | POA: Diagnosis not present

## 2022-07-01 DIAGNOSIS — R109 Unspecified abdominal pain: Secondary | ICD-10-CM | POA: Diagnosis not present

## 2022-07-01 DIAGNOSIS — E119 Type 2 diabetes mellitus without complications: Secondary | ICD-10-CM | POA: Diagnosis not present

## 2022-07-01 DIAGNOSIS — K219 Gastro-esophageal reflux disease without esophagitis: Secondary | ICD-10-CM | POA: Diagnosis not present

## 2022-07-06 ENCOUNTER — Ambulatory Visit: Payer: Medicare Other

## 2022-07-08 ENCOUNTER — Ambulatory Visit: Payer: Medicare Other

## 2022-07-12 DIAGNOSIS — G8929 Other chronic pain: Secondary | ICD-10-CM | POA: Diagnosis not present

## 2022-07-12 DIAGNOSIS — M1811 Unilateral primary osteoarthritis of first carpometacarpal joint, right hand: Secondary | ICD-10-CM | POA: Diagnosis not present

## 2022-07-12 DIAGNOSIS — M79671 Pain in right foot: Secondary | ICD-10-CM | POA: Diagnosis not present

## 2022-07-12 DIAGNOSIS — M1812 Unilateral primary osteoarthritis of first carpometacarpal joint, left hand: Secondary | ICD-10-CM | POA: Diagnosis not present

## 2022-07-12 DIAGNOSIS — M79672 Pain in left foot: Secondary | ICD-10-CM | POA: Diagnosis not present

## 2022-07-13 ENCOUNTER — Ambulatory Visit: Payer: Medicare Other

## 2022-07-15 ENCOUNTER — Ambulatory Visit: Payer: Medicare Other

## 2022-07-19 DIAGNOSIS — K219 Gastro-esophageal reflux disease without esophagitis: Secondary | ICD-10-CM | POA: Diagnosis not present

## 2022-07-19 DIAGNOSIS — R1310 Dysphagia, unspecified: Secondary | ICD-10-CM | POA: Diagnosis not present

## 2022-07-19 DIAGNOSIS — Z794 Long term (current) use of insulin: Secondary | ICD-10-CM | POA: Diagnosis not present

## 2022-07-19 DIAGNOSIS — Z87891 Personal history of nicotine dependence: Secondary | ICD-10-CM | POA: Diagnosis not present

## 2022-07-19 DIAGNOSIS — D72820 Lymphocytosis (symptomatic): Secondary | ICD-10-CM | POA: Diagnosis not present

## 2022-07-19 DIAGNOSIS — E119 Type 2 diabetes mellitus without complications: Secondary | ICD-10-CM | POA: Diagnosis not present

## 2022-07-19 DIAGNOSIS — J449 Chronic obstructive pulmonary disease, unspecified: Secondary | ICD-10-CM | POA: Diagnosis not present

## 2022-07-20 ENCOUNTER — Ambulatory Visit: Payer: Medicare Other

## 2022-07-22 ENCOUNTER — Ambulatory Visit: Payer: Medicare Other

## 2022-07-23 ENCOUNTER — Other Ambulatory Visit: Payer: Self-pay | Admitting: Psychiatry

## 2022-07-23 DIAGNOSIS — Z79899 Other long term (current) drug therapy: Secondary | ICD-10-CM | POA: Diagnosis not present

## 2022-07-23 DIAGNOSIS — E119 Type 2 diabetes mellitus without complications: Secondary | ICD-10-CM | POA: Diagnosis not present

## 2022-07-23 NOTE — Telephone Encounter (Signed)
Patient has a Klonopin prescription dated 07/22/2022-sent by Dr. Modesta Messing per review of medical records.

## 2022-07-27 ENCOUNTER — Ambulatory Visit: Payer: Medicare Other

## 2022-07-27 DIAGNOSIS — I10 Essential (primary) hypertension: Secondary | ICD-10-CM | POA: Diagnosis not present

## 2022-07-27 DIAGNOSIS — E039 Hypothyroidism, unspecified: Secondary | ICD-10-CM | POA: Diagnosis not present

## 2022-07-27 DIAGNOSIS — E782 Mixed hyperlipidemia: Secondary | ICD-10-CM | POA: Diagnosis not present

## 2022-07-27 DIAGNOSIS — E119 Type 2 diabetes mellitus without complications: Secondary | ICD-10-CM | POA: Diagnosis not present

## 2022-07-29 ENCOUNTER — Ambulatory Visit: Payer: Medicare Other

## 2022-08-03 ENCOUNTER — Ambulatory Visit: Payer: Medicare Other

## 2022-08-05 ENCOUNTER — Ambulatory Visit: Payer: Medicare Other

## 2022-08-10 ENCOUNTER — Ambulatory Visit: Payer: Medicare Other

## 2022-08-12 ENCOUNTER — Ambulatory Visit: Payer: Medicare Other

## 2022-08-16 ENCOUNTER — Ambulatory Visit
Admission: EM | Admit: 2022-08-16 | Discharge: 2022-08-16 | Disposition: A | Discharge status: Home / Self Care | Payer: 59

## 2022-08-16 ENCOUNTER — Ambulatory Visit (INDEPENDENT_AMBULATORY_CARE_PROVIDER_SITE_OTHER): Payer: 59 | Admitting: Licensed Clinical Social Worker

## 2022-08-16 DIAGNOSIS — F259 Schizoaffective disorder, unspecified: Secondary | ICD-10-CM | POA: Diagnosis not present

## 2022-08-16 DIAGNOSIS — F4310 Post-traumatic stress disorder, unspecified: Secondary | ICD-10-CM

## 2022-08-16 NOTE — Progress Notes (Signed)
THERAPIST PROGRESS NOTE  Session Time: 9:08AM-9:56AM  Participation Level: Active  Behavioral Response: Casual, Neat, and Well GroomedAlertDepressed  Type of Therapy: Individual Therapy  Treatment Goals addressed:  Reduce frequency, intensity, and duration of depression symptoms so that daily functioning is improved  Increase coping skills to manage depression and improve ability to perform daily activities  Reduce overall frequency, intensity and duration of anxiety so that daily functioning is not impaired per pt self report 3 out of 5 sessions.   Recall traumatic events without becoming overwhelmed with negative emotions  Angela Fernandez will attend and participate in therapeutic, recreational and educational activities that support interpersonal effectiveness   Angela Fernandez will recognize socially inappropriate behaviors and develop alternative behaviors  ProgressTowards Goals: Progressing  Interventions: CBT, DBT, Motivational Interviewing, Strength-based, and Other: ACT  Summary: Angela Fernandez is a 77 y.o. female who presents with mixed sxs of anxiety and depression related to schizoaffective diagnosis and hx of trauma. Sxs endorsed including but not limited to lack of motivation and interest, depressed mood, fatigue, irritability, worry, difficulty controlling worry, and hopelessness. Pt oriented to person, place, and time. Pt denies SI/HI or A/V hallucinations. Pt was cooperative during visit and was engaged throughout the visit. Pt does not report any other concerns at the time of visit.  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 Pt reported difficulty completing tasks that would help her and feeling shame due to knowing how to improve and finding it difficult to.   LCSW introduced pt to self and answered questions pt had re: what to expect from tx with LCSW. Pt shared what has worked and not worked in therapy previously. Reviewed tx plan with pt and confirmed that goals continue to be  applicable.   Pt reported noticing an increase in lack of motivation starting 5-10 years ago. Pt shared that she used to live and [place] and was very independent. Pt reported that moving to [place] was beneficial in some regards and pt reported that it was also bad for her relationship with her daughter. Pt reported that she lives with her granddaughter and her boyfriend now and feels like a burden. Pt reported that she is grieving her relationship with her daughter. Pt reported feeling like her family thinks she is incompetent.   Pt reported that she is resigning herself to idea that she is getting older and cannot do things she used to. Pt engaged in conversation about life being full of peaks and exploring value at each life stage. Explored idea that relationship with self impacts relationships with others. Introduced pt to idea of projecting view of self onto others. Encouraged pt to explore ways to seek validation from self in addition to others.   Pt reported feeling that her family does not know who she is and does not seem interested in knowing who she is. Encouraged pt to show who she is as opposed to assuming educator role and telling who she is.   Pt shared that her daughter is frustrated that pt cannot do as much as she used to. Invited pt to reclaim her frustration and not let daughter hold it all for her. Invited pt to reflect on the fact that she is more frustrated than her daughter and encouraged pt see this frustration as a point of alignment with daughter.   Pt reported that she finds community by [facility].   Provided pt a values inventory to reflect on her wants for her life.   LCSW  provided mood monitoring and treatment progress review in the context of this episode of treatment. LCSW reviewed the pt's mood status since last session.   Pt is continuing to apply interventions/techniques learned in session into daily life situations. Pt is currently on track to meet goals utilizing  interventions that are discussed in session. Treatment to continue as indicated. Personal growth and progress toward goals noted above.  Continued Recommendations as followed: Self-care behaviors, positive social engagements, focusing on positive physical and emotional wellness, and focusing on life/work balance.    Suicidal/Homicidal: Nowithout intent/plan  Therapist Response:  Provided pt education re: acceptance. Discussed how to make acceptance accessible at all parts of pt's healing journey.   Approached pt with strengths based perspective to assist pt in exploring strengths in moments of feeling low.   LCSW practiced active listening to validate pt participation, build rapport, and create safe space for pt to feel heard as they are disclosing their thoughts and feelings.   LCSW utilized therapeutic conversation skills informed by CBT, DBT, and ACT to expose pt to multiple ways of thinking about healing and to provide pt to access to multiple interventions.  LCSW introduced pt to Acceptance and Commitment Therapy. Pt engaged in discussion on how to explore what they must accept in order to commit to what they have identified as important. Introduced pt to values directed goal exploration in order to identify goals of importance.   Introduced pt to Dialectical Behavior Therapy and the importance of acceptance and change. Discussed concept of radical acceptance and also assisted pt in learning barriers to engaging in radical acceptance in journey towards change. Discussed importance of leaning into the dialectic and taught pt "and also" statements to assist in engaging with the bothness of change.   Plan: Return again in 1 weeks.  Diagnosis: Schizoaffective disorder, unspecified type  PTSD (post-traumatic stress disorder)    08/16/2022   10:01 AM 06/29/2022    1:38 PM 06/16/2022   11:21 AM 06/02/2022   10:52 AM  GAD 7 : Generalized Anxiety Score  Nervous, Anxious, on Edge 2 3 1 3    Control/stop worrying 2 3 2 2   Worry too much - different things 1 3 2 2   Trouble relaxing 1 3 2 1   Restless 2 2 1 1   Easily annoyed or irritable 2 3 2 3   Afraid - awful might happen 2 3 1 2   Total GAD 7 Score 12 20 11 14   Anxiety Difficulty  Extremely difficult Very difficult Somewhat difficult       08/16/2022   10:01 AM 06/29/2022    1:38 PM 06/16/2022   11:20 AM  Depression screen PHQ 2/9  Decreased Interest 2 2 1   Down, Depressed, Hopeless 3 2 2   PHQ - 2 Score 5 4 3   Altered sleeping 3 3 3   Tired, decreased energy 2 2 3   Change in appetite 3 1 2   Feeling bad or failure about yourself  3 3   Trouble concentrating 2 1 2   Moving slowly or fidgety/restless 2 0 0  Suicidal thoughts 0 1 0  PHQ-9 Score 20 15 13   Difficult doing work/chores Somewhat difficult Very difficult Somewhat difficult    Collaboration of Care: Psychiatrist AEB Dr. Vanetta Shawl  Patient/Guardian was advised Release of Information must be obtained prior to any record release in order to collaborate their care with an outside provider. Patient/Guardian was advised if they have not already done so to contact the registration department to sign all necessary  forms in order for us to release information regarding their care.   Consent: Patient/Guardian gives verbal consent for treatment and assignment of benefits for services provided during this visit. Patient/Guardian expressed understanding and agreed to proceed.   Geoffry ParadiseKatie M Buelah Rennie, LCSW 08/16/2022

## 2022-08-17 ENCOUNTER — Ambulatory Visit: Payer: Medicare Other

## 2022-08-18 DIAGNOSIS — S40862A Insect bite (nonvenomous) of left upper arm, initial encounter: Secondary | ICD-10-CM | POA: Diagnosis not present

## 2022-08-18 DIAGNOSIS — W57XXXA Bitten or stung by nonvenomous insect and other nonvenomous arthropods, initial encounter: Secondary | ICD-10-CM | POA: Diagnosis not present

## 2022-08-19 ENCOUNTER — Ambulatory Visit: Payer: Medicare Other

## 2022-08-24 ENCOUNTER — Ambulatory Visit: Payer: Medicare Other

## 2022-08-25 DIAGNOSIS — H04123 Dry eye syndrome of bilateral lacrimal glands: Secondary | ICD-10-CM | POA: Diagnosis not present

## 2022-08-26 ENCOUNTER — Ambulatory Visit: Payer: Medicare Other
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
## 2022-08-27 ENCOUNTER — Telehealth: Payer: Self-pay

## 2022-08-27 NOTE — Telephone Encounter (Signed)
Pt called triage needing to see Dr Valentino Saxon for her bladder infection. That Dr Valentino Saxon had her on antibiotics, I advised her that she has not seen [facility] since 2022 and in her chart she had been to [facility] maybe that's who she ment to call. Pt aware she would call them about her issue.

## 2022-08-30 ENCOUNTER — Ambulatory Visit (INDEPENDENT_AMBULATORY_CARE_PROVIDER_SITE_OTHER): Payer: 59 | Admitting: Psychiatry

## 2022-08-30 ENCOUNTER — Encounter: Payer: Self-pay | Admitting: Psychiatry

## 2022-08-30 VITALS — BP 143/81 | HR 81 | Temp 98.0°F | Ht 65.5 in | Wt 203.6 lb

## 2022-08-30 DIAGNOSIS — G4700 Insomnia, unspecified: Secondary | ICD-10-CM

## 2022-08-30 DIAGNOSIS — F259 Schizoaffective disorder, unspecified: Secondary | ICD-10-CM | POA: Diagnosis not present

## 2022-08-30 DIAGNOSIS — F411 Generalized anxiety disorder: Secondary | ICD-10-CM

## 2022-08-30 DIAGNOSIS — F4310 Post-traumatic stress disorder, unspecified: Secondary | ICD-10-CM

## 2022-08-30 MED ORDER — ZOLPIDEM TARTRATE 5 MG PO TABS: 5.0000 mg | ORAL_TABLET | Freq: Every evening | ORAL | 2 refills | Status: DC | PRN

## 2022-08-30 MED ORDER — CLONAZEPAM 1 MG PO TABS: 1.0000 mg | ORAL_TABLET | Freq: Two times a day (BID) | ORAL | 1 refills | Status: DC | PRN

## 2022-08-30 NOTE — Patient Instructions (Signed)
Continue Pristiq 50 mg every day Continue bupropion 200 mg twice a day Continue Latuda 120 mg at night  Decrease clonazepam 1 mg twice a day  Continue Ambien 5 mg at night as needed for insomnia Next appointment: 7/9 at 1:30

## 2022-08-31 ENCOUNTER — Ambulatory Visit: Payer: Medicare Other

## 2022-09-01 ENCOUNTER — Ambulatory Visit (INDEPENDENT_AMBULATORY_CARE_PROVIDER_SITE_OTHER): Payer: 59

## 2022-09-01 VITALS — BP 130/80 | Ht 64.0 in | Wt 201.0 lb

## 2022-09-01 DIAGNOSIS — R350 Frequency of micturition: Secondary | ICD-10-CM | POA: Diagnosis not present

## 2022-09-01 DIAGNOSIS — R3915 Urgency of urination: Secondary | ICD-10-CM

## 2022-09-01 LAB — POCT URINALYSIS DIPSTICK
Bilirubin, UA: NEGATIVE
Blood, UA: NEGATIVE
Glucose, UA: NEGATIVE
Ketones, UA: NEGATIVE
Leukocytes, UA: NEGATIVE
Nitrite, UA: NEGATIVE
Protein, UA: NEGATIVE
Spec Grav, UA: 1.01
Urobilinogen, UA: 1 U/dL
pH, UA: 6.5

## 2022-09-01 NOTE — Progress Notes (Signed)
    NURSE VISIT NOTE  Subjective:    Patient ID: Angela Fernandez, female    DOB: May 29, 1945, 77 y.o.   MRN: [id number]       HPI  Patient is a 77 y.o. [id number] female who presents for urinary frequency and urinary urgency for 1 week.  Patient denies dysuria, hematuria, flank pain, abdominal pain, pelvic pain, cloudy malordorous urine, genital rash, genital irritation, and vaginal discharge.  Patient does have a history of recurrent UTI.  Patient does not have a history of pyelonephritis.    Objective:    BP 130/80   Ht  (1.626 m)   Wt 201 lb (91.2 kg)   BMI 34.50 kg/m    Lab Review  Results for orders placed or performed in visit on 09/01/22  POCT urinalysis dipstick  Result Value Ref Range   Color, UA YELLOW    Clarity, UA DARK    Glucose, UA Negative Negative   Bilirubin, UA Negative    Ketones, UA Negative    Spec Grav, UA 1.010 1.010 - 1.025   Blood, UA Negative    pH, UA 6.5 5.0 - 8.0   Protein, UA Negative Negative   Urobilinogen, UA 1.0 0.2 or 1.0 E.U./dL   Nitrite, UA Negative    Leukocytes, UA Negative Negative   Appearance DARK    Odor      Assessment:   1. Urine frequency   2. Urgency of urination      Plan:   Urine Culture Sent.   Cornelius Moras, CMA

## 2022-09-01 NOTE — Patient Instructions (Signed)
Urinary Tract Infection, Adult A urinary tract infection (UTI) is an infection of any part of the urinary tract. The urinary tract includes: The kidneys. The ureters. The bladder. The urethra. These organs make, store, and get rid of pee (urine) in the body. What are the causes? This infection is caused by germs (bacteria) in your genital area. These germs grow and cause swelling (inflammation) of your urinary tract. What increases the risk? The following factors may make you more likely to develop this condition: Using a small, thin tube (catheter) to drain pee. Not being able to control when you pee or poop (incontinence). Being female. If you are female, these things can increase the risk: Using these methods to prevent pregnancy: A medicine that kills sperm (spermicide). A device that blocks sperm (diaphragm). Having low levels of a female hormone (estrogen). Being pregnant. You are more likely to develop this condition if: You have genes that add to your risk. You are sexually active. You take antibiotic medicines. You have trouble peeing because of: A prostate that is bigger than normal, if you are female. A blockage in the part of your body that drains pee from the bladder. A kidney stone. A nerve condition that affects your bladder. Not getting enough to drink. Not peeing often enough. You have other conditions, such as: Diabetes. A weak disease-fighting system (immune system). Sickle cell disease. Gout. Injury of the spine. What are the signs or symptoms? Symptoms of this condition include: Needing to pee right away. Peeing small amounts often. Pain or burning when peeing. Blood in the pee. Pee that smells bad or not like normal. Trouble peeing. Pee that is cloudy. Fluid coming from the vagina, if you are female. Pain in the belly or lower back. Other symptoms include: Vomiting. Not feeling hungry. Feeling mixed up (confused). This may be the first symptom in  older adults. Being tired and grouchy (irritable). A fever. Watery poop (diarrhea). How is this treated? Taking antibiotic medicine. Taking other medicines. Drinking enough water. In some cases, you may need to see a specialist. Follow these instructions at home:  Medicines Take over-the-counter and prescription medicines only as told by your doctor. If you were prescribed an antibiotic medicine, take it as told by your doctor. Do not stop taking it even if you start to feel better. General instructions Make sure you: Pee until your bladder is empty. Do not hold pee for a long time. Empty your bladder after sex. Wipe from front to back after peeing or pooping if you are a female. Use each tissue one time when you wipe. Drink enough fluid to keep your pee pale yellow. Keep all follow-up visits. Contact a doctor if: You do not get better after 1-2 days. Your symptoms go away and then come back. Get help right away if: You have very bad back pain. You have very bad pain in your lower belly. You have a fever. You have chills. You feeling like you will vomit or you vomit. Summary A urinary tract infection (UTI) is an infection of any part of the urinary tract. This condition is caused by germs in your genital area. There are many risk factors for a UTI. Treatment includes antibiotic medicines. Drink enough fluid to keep your pee pale yellow. This information is not intended to replace advice given to you by your health care provider. Make sure you discuss any questions you have with your health care provider. Document Revised: 12/07/2019 Document Reviewed: 12/07/2019 Elsevier Patient Education    2023 [employer]  

## 2022-09-02 ENCOUNTER — Ambulatory Visit: Payer: Medicare Other

## 2022-09-03 LAB — URINE CULTURE

## 2022-09-07 ENCOUNTER — Ambulatory Visit: Payer: Medicare Other

## 2022-09-09 ENCOUNTER — Ambulatory Visit: Payer: Medicare Other

## 2022-09-13 ENCOUNTER — Ambulatory Visit (INDEPENDENT_AMBULATORY_CARE_PROVIDER_SITE_OTHER): Payer: 59 | Admitting: Licensed Clinical Social Worker

## 2022-09-13 DIAGNOSIS — F4310 Post-traumatic stress disorder, unspecified: Secondary | ICD-10-CM

## 2022-09-13 DIAGNOSIS — F259 Schizoaffective disorder, unspecified: Secondary | ICD-10-CM | POA: Diagnosis not present

## 2022-09-13 NOTE — Progress Notes (Signed)
THERAPIST PROGRESS NOTE  Session Time: 11:02AM-11:57AM  Participation Level: Active  Behavioral Response: CasualAlertAnxious and Depressed  Type of Therapy: Individual Therapy  Treatment Goals addressed:  Reduce overall frequency, intensity and duration of depression so that daily functioning is not impaired per pt self report 3 out of 5 sessions documented.   Reduce overall frequency, intensity and duration of anxiety so that daily functioning is not impaired per pt self report 3 out of 5 sessions.   Recall traumatic events without becoming overwhelmed with negative emotions  ProgressTowards Goals: Not Progressing  Interventions: CBT, DBT, Motivational Interviewing, Strength-based, and Other: ACT  Summary: Angela Fernandez is a 77 y.o. female who presents with mixed sxs of anxiety and depression. Sxs endorsed including but not limited to fatigue, worry, negative self affect, difficulty controlling worry, hopelessness, and crying spells. Pt oriented to person, place, and time. Pt denies SI/HI or A/V hallucinations. Pt was cooperative during visit and was engaged throughout the visit. Pt does not report any other concerns at the time of visit.  Pt utilized therapeutic space to process home stressors and difficulties in relationship with granddaughter. Pt reported interest in moving into place on her own. Assisted pt in identifying avoidance behavior. Pt participated in problem solving conversation and explored communication skills to assist in working through stressors with granddaughter.   Pt named that transportation is a barrier to living a life that she is excited to live. Discussed ways of pursuing community online and discussed ways to learn to use technology to benefit pt.   Invited pt to live her truth instead of proving her truth with words. Discussed barriers to relationships healing being unmet expectation. Practiced ways to discuss expectations with loved ones.   LCSW provided  mood monitoring and treatment progress review in the context of this episode of treatment. LCSW reviewed the pt's mood status since last session.   Continued Recommendations as followed: Self-care behaviors, positive social engagements, focusing on positive physical and emotional wellness, and focusing on life/work balance.    Suicidal/Homicidal: Nowithout intent/plan  Therapist Response:  Provided pt education re: acceptance. Discussed how to make acceptance accessible at all parts of pt's healing journey.   Approached pt with strengths based perspective to assist pt in exploring strengths in moments of feeling low.   LCSW practiced active listening to validate pt participation, build rapport, and create safe space for pt to feel heard as they are disclosing their thoughts and feelings.   LCSW utilized therapeutic conversation skills informed by CBT, DBT, and ACT to expose pt to multiple ways of thinking about healing and to provide pt to access to multiple interventions.  Plan: Return again in 2 weeks.  Diagnosis: Schizoaffective disorder, unspecified type (HCC)  PTSD (post-traumatic stress disorder)    09/13/2022    1:06 PM 08/16/2022   10:01 AM 06/29/2022    1:38 PM 06/16/2022   11:21 AM  GAD 7 : Generalized Anxiety Score  Nervous, Anxious, on Edge 3 2 3 1   Control/stop worrying 3 2 3 2   Worry too much - different things 3 1 3 2   Trouble relaxing 2 1 3 2   Restless 2 2 2 1   Easily annoyed or irritable 2 2 3 2   Afraid - awful might happen 3 2 3 1   Total GAD 7 Score 18 12 20 11   Anxiety Difficulty Very difficult  Extremely difficult Very difficult       09/13/2022    1:05 PM 08/30/2022    2:11  PM 08/16/2022   10:01 AM  Depression screen PHQ 2/9  Decreased Interest 1 3 2   Down, Depressed, Hopeless 1 2 3   PHQ - 2 Score 2 5 5   Altered sleeping 3 3 3   Tired, decreased energy 1 3 2   Change in appetite 3 2 3   Feeling bad or failure about yourself  3 1 3   Trouble concentrating 0 0 2   Moving slowly or fidgety/restless 2 2 2   Suicidal thoughts 0 1 0  PHQ-9 Score 14 17 20   Difficult doing work/chores Very difficult Extremely dIfficult Somewhat difficult    Collaboration of Care: Psychiatrist AEB Dr. Vanetta Shawl  Patient/Guardian was advised Release of Information must be obtained prior to any record release in order to collaborate their care with an outside provider. Patient/Guardian was advised if they have not already done so to contact the registration department to sign all necessary forms in order for [place] to release information regarding their care.   Consent: Patient/Guardian gives verbal consent for treatment and assignment of benefits for services provided during this visit. Patient/Guardian expressed understanding and agreed to proceed.   Geoffry Paradise, LCSW 09/13/2022

## 2022-09-14 ENCOUNTER — Ambulatory Visit: Payer: Medicare Other

## 2022-09-16 ENCOUNTER — Ambulatory Visit: Payer: Medicare Other

## 2022-09-19 ENCOUNTER — Other Ambulatory Visit: Payer: Self-pay | Admitting: Psychiatry

## 2022-09-21 ENCOUNTER — Ambulatory Visit: Payer: Medicare Other

## 2022-09-23 ENCOUNTER — Ambulatory Visit: Payer: Medicare Other

## 2022-09-27 ENCOUNTER — Ambulatory Visit (INDEPENDENT_AMBULATORY_CARE_PROVIDER_SITE_OTHER): Payer: 59 | Admitting: Licensed Clinical Social Worker

## 2022-09-27 DIAGNOSIS — Z91199 Patient's noncompliance with other medical treatment and regimen due to unspecified reason: Secondary | ICD-10-CM

## 2022-09-28 ENCOUNTER — Ambulatory Visit: Payer: Medicare Other

## 2022-09-30 ENCOUNTER — Ambulatory Visit: Payer: Medicare Other

## 2022-10-05 ENCOUNTER — Ambulatory Visit: Payer: Medicare Other

## 2022-10-07 ENCOUNTER — Ambulatory Visit: Payer: Medicare Other

## 2022-10-11 ENCOUNTER — Ambulatory Visit: Payer: 59 | Admitting: Licensed Clinical Social Worker

## 2022-10-11 DIAGNOSIS — E039 Hypothyroidism, unspecified: Secondary | ICD-10-CM | POA: Diagnosis not present

## 2022-10-11 DIAGNOSIS — R1310 Dysphagia, unspecified: Secondary | ICD-10-CM | POA: Diagnosis not present

## 2022-10-11 DIAGNOSIS — K219 Gastro-esophageal reflux disease without esophagitis: Secondary | ICD-10-CM | POA: Diagnosis not present

## 2022-10-11 DIAGNOSIS — R109 Unspecified abdominal pain: Secondary | ICD-10-CM | POA: Diagnosis not present

## 2022-10-12 ENCOUNTER — Ambulatory Visit: Payer: Medicare Other

## 2022-10-13 ENCOUNTER — Other Ambulatory Visit: Payer: Self-pay

## 2022-10-13 ENCOUNTER — Ambulatory Visit (INDEPENDENT_AMBULATORY_CARE_PROVIDER_SITE_OTHER): Payer: 59 | Admitting: Gastroenterology

## 2022-10-13 ENCOUNTER — Encounter: Payer: Self-pay | Admitting: Gastroenterology

## 2022-10-13 VITALS — BP 125/76 | HR 77 | Temp 97.6°F | Ht 65.5 in | Wt 266.4 lb

## 2022-10-13 DIAGNOSIS — R195 Other fecal abnormalities: Secondary | ICD-10-CM

## 2022-10-13 DIAGNOSIS — R140 Abdominal distension (gaseous): Secondary | ICD-10-CM

## 2022-10-13 DIAGNOSIS — D649 Anemia, unspecified: Secondary | ICD-10-CM | POA: Diagnosis not present

## 2022-10-13 NOTE — Progress Notes (Signed)
Arlyss Repress, MD [address]  [address]  [place], [place] [address]  Main: [phone]  Fax: [phone]    Gastroenterology Consultation  Referring Provider:     Mick Sell, MD Primary Care Physician:  Mick Sell, MD Primary Gastroenterologist:  Dr. Arlyss Repress Reason for Consultation: Loose stools, abdominal bloating, macrocytic anemia        HPI:   Angela Fernandez is a 77 y.o. female referred by Dr. Mick Sell, MD  for consultation & management of chronic symptoms of abdominal bloating, loose stools and microcytic anemia.  Patient has history of diabetes, previously on Trulicity.  Trulicity has been discontinued because of upper GI symptoms and currently her upper GI symptoms have resolved.  And, currently her upper GI symptoms have resolved.  She reports having feeling gassy, bloated and passing flatus.  She also reports 2-3 loose stools, without any rectal bleeding.  Denies any weight loss or loss of appetite.  She does consume artificial sweeteners daily as well as loves eating cheese.  Has mild macrocytic anemia.  Most recent hemoglobin 11.7, MCV 101.4.  HbA1c is 5.6 in 3/24.  NSAIDs: None  Antiplts/Anticoagulants/Anti thrombotics: None  GI Procedures: EGD and colonoscopy in 2021 at [facility] in [place] EGD revealed irregular Z-line only and colonoscopy revealed subcentimeter polyp that was resected, pathology confirmed hyperplastic polyp   Past Medical History:  Diagnosis Date   Anemia    Arthritis    Bipolar affect, depressed (HCC)    COPD (chronic obstructive pulmonary disease) (HCC)    Diabetes mellitus without complication (HCC)    type II   GERD (gastroesophageal reflux disease)    Hyperlipidemia    Hypertension    Hypothyroidism    PTSD (post-traumatic stress disorder)    Schizoaffective disorder (HCC)    Sleep apnea    cpap   Stroke (HCC)    hx of  mini stroke - 2001   TIA (transient ischemic attack)  2010   UTI (urinary tract infection)     Past Surgical History:  Procedure Laterality Date   BILATERAL CARPAL TUNNEL RELEASE Bilateral    BREAST BIOPSY     BREAST SURGERY Right    lumpectomy   CATARACT EXTRACTION W/ INTRAOCULAR LENS  IMPLANT, BILATERAL Bilateral    CHOLECYSTECTOMY     COLONOSCOPY     ESOPHAGOGASTRODUODENOSCOPY     EYE SURGERY     JOINT REPLACEMENT     NASAL SINUS SURGERY     REPLACEMENT TOTAL KNEE BILATERAL Bilateral    REVERSE SHOULDER ARTHROPLASTY Right 09/02/2020   Procedure: REVERSE SHOULDER ARTHROPLASTY;  Surgeon: Christena Flake, MD;  Location: [facility] ORS;  Service: Orthopedics;  Laterality: Right;   SHOULDER SURGERY Right 2020   rotator cuff repair   TOTAL SHOULDER REVISION Right 10/23/2021   Procedure: REVERSE SHOULDER REVISION;  Surgeon: Yolonda Kida, MD;  Location: [facility] ORS;  Service: Orthopedics;  Laterality: Right;  180     Current Outpatient Medications:    albuterol (VENTOLIN HFA) 108 (90 Base) MCG/ACT inhaler, Inhale 2 puffs into the lungs every 6 (six) hours as needed for wheezing or shortness of breath., Disp: 8 g, Rfl: 2   Aloe-Sodium Chloride (AYR SALINE NASAL GEL NA), Place 1 application. into the nose every other day., Disp: , Rfl:    buPROPion (WELLBUTRIN SR) 200 MG 12 hr tablet, Take 1 tablet (200 mg total) by mouth 2 (two) times daily., Disp: 60 tablet, Rfl: 5  clonazePAM (KLONOPIN) 1 MG tablet, Take 1 tablet (1 mg total) by mouth 2 (two) times daily as needed for anxiety., Disp: 60 tablet, Rfl: 1   cyclobenzaprine (FLEXERIL) 10 MG tablet, Take 10 mg by mouth 3 (three) times daily as needed., Disp: , Rfl:    desvenlafaxine (PRISTIQ) 50 MG 24 hr tablet, Take 50 mg by mouth daily., Disp: , Rfl:    Desvenlafaxine ER (PRISTIQ) 50 MG TB24, Take 1 tablet (50 mg total) by mouth at bedtime. psych, Disp: 30 tablet, Rfl: 5   diclofenac Sodium (VOLTAREN) 1 % GEL, Apply topically., Disp: , Rfl:    docusate sodium (COLACE) 100 MG capsule, Take 200 mg  by mouth daily. 2 tabs in the am, Disp: , Rfl:    Dulaglutide (TRULICITY) 0.75 MG/0.5ML SOPN, Inject 0.75 mg into the skin once a week., Disp: 3 mL, Rfl: 2   fenofibrate (TRICOR) 145 MG tablet, Take 1 tablet (145 mg total) by mouth daily. 4pm, Disp: 90 tablet, Rfl: 1   ferrous sulfate 325 (65 FE) MG tablet, Take 1 tablet (325 mg total) by mouth daily with breakfast., Disp: 90 tablet, Rfl: 0   gabapentin (NEURONTIN) 300 MG capsule, Take 300 mg by mouth at bedtime., Disp: , Rfl:    hydroxychloroquine (PLAQUENIL) 200 MG tablet, Take 200 mg by mouth 2 (two) times daily., Disp: , Rfl:    insulin glargine (LANTUS SOLOSTAR) 100 UNIT/ML Solostar Pen, Inject 24 Units into the skin daily., Disp: , Rfl:    levothyroxine (SYNTHROID) 175 MCG tablet, Take 1 tablet (175 mcg total) by mouth daily before breakfast., Disp: 90 tablet, Rfl: 1   lisinopril (ZESTRIL) 5 MG tablet, Take 1 tablet (5 mg total) by mouth daily., Disp: 90 tablet, Rfl: 1   Lurasidone HCl (LATUDA) 120 MG TABS, Take 1 tablet (120 mg total) by mouth daily., Disp: 30 tablet, Rfl: 5   meloxicam (MOBIC) 7.5 MG tablet, Take 1 tablet (7.5 mg total) by mouth daily., Disp: 90 tablet, Rfl: 0   metoprolol succinate (TOPROL-XL) 100 MG 24 hr tablet, Take 1 tablet (100 mg total) by mouth daily. Take with or immediately following a meal., Disp: 90 tablet, Rfl: 1   Multiple Vitamins-Minerals (HAIR/SKIN/NAILS) TABS, Take 1 tablet by mouth daily., Disp: , Rfl:    Multiple Vitamins-Minerals (PRESERVISION AREDS 2) CAPS, Take 1 capsule by mouth 2 (two) times daily., Disp: , Rfl:    omeprazole (PRILOSEC) 40 MG capsule, Take 1 capsule (40 mg total) by mouth daily., Disp: 90 capsule, Rfl: 1   ondansetron (ZOFRAN) 4 MG tablet, Take 4 mg by mouth 2 (two) times daily as needed for nausea or vomiting., Disp: , Rfl:    ondansetron (ZOFRAN-ODT) 4 MG disintegrating tablet, Take 4 mg by mouth every 8 (eight) hours as needed., Disp: , Rfl:    OVER THE COUNTER MEDICATION, Take 1  Scoop by mouth daily in the afternoon. Collagen protein powder, Disp: , Rfl:    RYBELSUS 14 MG TABS, Take 1 tablet by mouth every morning., Disp: , Rfl:    simvastatin (ZOCOR) 20 MG tablet, Take 1 tablet (20 mg total) by mouth daily., Disp: 90 tablet, Rfl: 1   tiZANidine (ZANAFLEX) 4 MG tablet, Take 2-4 mg by mouth every 8 (eight) hours as needed for muscle spasms., Disp: , Rfl:    trimethoprim (TRIMPEX) 100 MG tablet, Take 1 tablet (100 mg total) by mouth daily., Disp: 30 tablet, Rfl: 11   umeclidinium-vilanterol (ANORO ELLIPTA) 62.5-25 MCG/ACT AEPB, Inhale 2 puffs into  the lungs daily as needed (shortness of breath)., Disp: , Rfl:    Vibegron (GEMTESA) 75 MG TABS, Take 75 mg by mouth daily., Disp: 30 tablet, Rfl: 11   VITAMIN E, TOPICAL, CREA, Apply 1 application. topically daily., Disp: , Rfl:    zolpidem (AMBIEN) 5 MG tablet, Take 1 tablet (5 mg total) by mouth at bedtime as needed for sleep., Disp: 30 tablet, Rfl: 2   clonazePAM (KLONOPIN) 1 MG tablet, May take 1 tablet (1 mg total) by mouth 2 (two) times daily as needed for anxiety. May also take 0.5 tablets (0.5 mg total) daily as needed for anxiety., Disp: 75 tablet, Rfl: 0   zolpidem (AMBIEN) 5 MG tablet, Take 1 tablet (5 mg total) by mouth at bedtime as needed for sleep., Disp: 30 tablet, Rfl: 2   Family History  Problem Relation Age of Onset   Alzheimer's disease Mother    Alcohol abuse Father    Schizophrenia Father    Pancreatitis Father    Alcohol abuse Maternal Grandfather    Alzheimer's disease Maternal Grandmother      Social History   Tobacco Use   Smoking status: Former    Years: 54    Types: Cigarettes    Quit date: 2021    Years since quitting: 3.4   Smokeless tobacco: Never  Vaping Use   Vaping Use: Never used  Substance Use Topics   Alcohol use: Not Currently    Comment: quit in age 39's or 48's   Drug use: Yes    Comment: last used in age 26's    Allergies as of 10/13/2022   (No Known Allergies)     Review of Systems:    All systems reviewed and negative except where noted in HPI.   Physical Exam:  BP 125/76 (BP Location: Left Arm, Patient Position: Sitting, Cuff Size: Large)   Pulse 77   Temp 97.6 F (36.4 C) (Oral)   Ht 5' 5.5" (1.664 m)   Wt 266 lb 6 oz (120.8 kg)   BMI 43.65 kg/m  No LMP recorded. Patient is postmenopausal.  General:   Alert,  Well-developed, well-nourished, pleasant and cooperative in NAD Head:  Normocephalic and atraumatic. Eyes:  Sclera clear, no icterus.   Conjunctiva pink. Ears:  Normal auditory acuity. Nose:  No deformity, discharge, or lesions. Mouth:  No deformity or lesions,oropharynx pink & moist. Neck:  Supple; no masses or thyromegaly. Lungs:  Respirations even and unlabored.  Clear throughout to auscultation.   No wheezes, crackles, or rhonchi. No acute distress. Heart:  Regular rate and rhythm; no murmurs, clicks, rubs, or gallops. Abdomen:  Normal bowel sounds. Soft, non-tender and non-distended without masses, hepatosplenomegaly or hernias noted.  No guarding or rebound tenderness.   Rectal: Not performed Msk:  Symmetrical without gross deformities. Good, equal movement & strength bilaterally. Pulses:  Normal pulses noted. Extremities:  No clubbing or edema.  No cyanosis. Neurologic:  Alert and oriented x3;  grossly normal neurologically. Skin:  Intact without significant lesions or rashes. No jaundice. Psych:  Alert and cooperative. Normal mood and affect.  Imaging Studies: No abdominal imaging  Assessment and Plan:   Angela Fernandez is a 78 y.o. female with history of metabolic syndrome, history of chronic tobacco use, quit smoking in 2021 is seen in consultation for 1 year history of nonbloody loose stools associated with abdominal gas and bloating Check celiac disease panel H. pylori stool antigen Pancreatic fecal elastase levels Check iron panel and B12, folate levels  Advised patient to follow lactose-free diet for 1 month  and cut back on artificial sweeteners, information provided Recommend abdominal CT based on the above workup  Follow up in 3 months with PA-C, Lavonna Monarch, MD

## 2022-10-13 NOTE — Patient Instructions (Signed)
Lactose-Free Diet, Adult If you have lactose intolerance, you are not able to digest lactose. Lactose is a natural sugar found mainly in dairy milk and dairy products. A lactose-free diet can help you avoid foods and beverages that contain lactose. What are tips for following this plan? Reading food labels Do not consume foods, beverages, vitamins, minerals, or medicines containing lactose. Read ingredient lists carefully. Look for the words "lactose-free" on labels. Meal planning Use alternatives to dairy milk and foods made with milk products. These include the following: Lactose-free milk. Soy milk with added calcium and vitamin D. Almond milk, coconut milk, rice milk, or other nondairy milk alternatives with added calcium and vitamin D. Note that a lot of these are low in protein. Soy products, such as soy yogurt, soy cheese, soy ice cream, and soy-based sour cream. Other nut milk products, such as almond yogurt, almond cheese, cashew yogurt, cashew cheese, cashew ice cream, coconut yogurt, and coconut ice cream. Medicines, vitamins, and supplements Use lactase enzyme drops or tablets as directed by your health care provider. Make sure you get enough calcium and vitamin D in your diet. A lactose-free eating plan can be lacking in these important nutrients. Take calcium and vitamin D supplements as directed by your health care provider. Talk with your health care provider about supplements if you are not able to get enough calcium and vitamin D from food. What foods should I eat?  Fruits All fresh, canned, frozen, or dried fruits and fruit juices that are not processed with lactose. Vegetables All fresh, frozen, and canned vegetables without cheese, cream, or butter sauces. Grains Any that are not made with dairy milk or dairy products. Meats and other proteins Any meat, fish, poultry, and other protein sources that are not made with dairy milk or dairy products. Fats and oils Any that  are not made with dairy milk or dairy products. Sweets and desserts Any that are not made with dairy milk or dairy products. Seasonings and condiments Any that are not made with dairy milk or dairy products. Calcium Calcium is found in many foods that contain lactose and is important for bone health. The amount of calcium you need depends on your age: Adults younger than 50 years: 1,000 mg of calcium a day. Adults older than 50 years: 1,200 mg of calcium a day. If you are not getting enough calcium, you may get it from other sources, including: Orange juice that has been fortified with calcium. This means that calcium has been added to the product. There are 300-350 mg of calcium in 1 cup (237 mL) of calcium-fortified orange juice. Soy milk fortified with calcium. There are 300-400 mg of calcium in 1 cup (237 mL) of calcium-fortified soy milk. Rice or almond milk fortified with calcium. There are 300 mg of calcium in 1 cup (237 mL) of calcium-fortified rice or almond milk. Breakfast cereals fortified with calcium. There are 100-1,000 mg of calcium in calcium-fortified breakfast cereals. Spinach, cooked. There are 145 mg of calcium in  cup (90 g) of cooked spinach. Edamame, cooked. There are 130 mg of calcium in  cup (47 g) of cooked edamame. Collard greens, cooked. There are 125 mg of calcium in  cup (85 g) of cooked collard greens. Kale, frozen or cooked. There are 90 mg of calcium in  cup (59 g) of cooked or frozen kale. Almonds. There are 95 mg of calcium in  cup (35 g) of almonds. Broccoli, cooked. There are 60 mg of calcium  in 1 cup (156 g) of cooked broccoli. The items listed above may not be a complete list of foods and beverages you can eat. Contact a dietitian for more options. What foods should I avoid? Lactose is found in dairy milk and dairy products, such as: Yogurt. Cheese. Butter. Margarine. Sour cream. Cream. Whipped toppings and creamers. Ice cream and other  dairy-based desserts. Lactose is also found in foods or products made with dairy milk or milk ingredients. To find out whether a food contains dairy milk or a milk ingredient, look at the ingredients list. Avoid foods with the statement "May contain milk" and foods that contain: Milk powder. Whey. Curd. Lactose. Lactoglobulin. The items listed above may not be a complete list of foods and beverages to avoid. Contact a dietitian for more information. Where to find more information [employer] of Diabetes and Digestive and Kidney Diseases: [employer] Summary If you are lactose intolerant, it means that you are not able to digest lactose, a natural sugar found in milk and milk products. Following a lactose-free diet can help you manage this condition. Calcium is important for bone health and is found in many foods that contain lactose. Talk with your health care provider about other sources of calcium. This information is not intended to replace advice given to you by your health care provider. Make sure you discuss any questions you have with your health care provider. Document Revised: 04/01/2020 Document Reviewed: 04/01/2020 Elsevier Patient Education  2024 [employer].

## 2022-10-14 ENCOUNTER — Ambulatory Visit: Payer: Medicare Other

## 2022-10-16 LAB — IRON,TIBC AND FERRITIN PANEL
Ferritin: 870 ng/mL — ABNORMAL HIGH (ref 15–150)
Iron Saturation: 28 % (ref 15–55)
Iron: 101 ug/dL (ref 27–139)
Total Iron Binding Capacity: 360 ug/dL (ref 250–450)
UIBC: 259 ug/dL (ref 118–369)

## 2022-10-16 LAB — CELIAC DISEASE PANEL
Endomysial IgA: NEGATIVE
IgA/Immunoglobulin A, Serum: 132 mg/dL (ref 64–422)
Transglutaminase IgA: <2 U/mL (ref 0–3)

## 2022-10-16 LAB — H. PYLORI BREATH TEST: H pylori Breath Test: NEGATIVE

## 2022-10-16 LAB — B12 AND FOLATE PANEL
Folate: 8.5 ng/mL (ref >3.0)
Vitamin B-12: 609 pg/mL (ref 232–1245)

## 2022-10-18 ENCOUNTER — Encounter: Payer: Self-pay | Admitting: Gastroenterology

## 2022-10-19 ENCOUNTER — Ambulatory Visit: Payer: Medicare Other

## 2022-10-20 DIAGNOSIS — Z0000 Encounter for general adult medical examination without abnormal findings: Secondary | ICD-10-CM | POA: Diagnosis not present

## 2022-10-20 DIAGNOSIS — K219 Gastro-esophageal reflux disease without esophagitis: Secondary | ICD-10-CM | POA: Diagnosis not present

## 2022-10-20 DIAGNOSIS — E119 Type 2 diabetes mellitus without complications: Secondary | ICD-10-CM | POA: Diagnosis not present

## 2022-10-20 DIAGNOSIS — M542 Cervicalgia: Secondary | ICD-10-CM | POA: Diagnosis not present

## 2022-10-20 DIAGNOSIS — Z794 Long term (current) use of insulin: Secondary | ICD-10-CM | POA: Diagnosis not present

## 2022-10-20 DIAGNOSIS — I10 Essential (primary) hypertension: Secondary | ICD-10-CM | POA: Diagnosis not present

## 2022-10-20 DIAGNOSIS — E039 Hypothyroidism, unspecified: Secondary | ICD-10-CM | POA: Diagnosis not present

## 2022-10-20 DIAGNOSIS — R29818 Other symptoms and signs involving the nervous system: Secondary | ICD-10-CM | POA: Diagnosis not present

## 2022-10-20 DIAGNOSIS — E785 Hyperlipidemia, unspecified: Secondary | ICD-10-CM | POA: Diagnosis not present

## 2022-10-21 ENCOUNTER — Ambulatory Visit: Payer: Medicare Other

## 2022-10-25 ENCOUNTER — Ambulatory Visit (INDEPENDENT_AMBULATORY_CARE_PROVIDER_SITE_OTHER): Payer: 59 | Admitting: Licensed Clinical Social Worker

## 2022-10-25 DIAGNOSIS — F259 Schizoaffective disorder, unspecified: Secondary | ICD-10-CM | POA: Diagnosis not present

## 2022-10-25 DIAGNOSIS — F4310 Post-traumatic stress disorder, unspecified: Secondary | ICD-10-CM

## 2022-10-26 ENCOUNTER — Ambulatory Visit: Payer: Medicare Other

## 2022-10-27 DIAGNOSIS — M898X9 Other specified disorders of bone, unspecified site: Secondary | ICD-10-CM | POA: Diagnosis not present

## 2022-10-27 DIAGNOSIS — M205X1 Other deformities of toe(s) (acquired), right foot: Secondary | ICD-10-CM | POA: Diagnosis not present

## 2022-10-27 DIAGNOSIS — M19072 Primary osteoarthritis, left ankle and foot: Secondary | ICD-10-CM | POA: Diagnosis not present

## 2022-10-27 DIAGNOSIS — M79671 Pain in right foot: Secondary | ICD-10-CM | POA: Diagnosis not present

## 2022-10-27 DIAGNOSIS — M19071 Primary osteoarthritis, right ankle and foot: Secondary | ICD-10-CM | POA: Diagnosis not present

## 2022-10-27 DIAGNOSIS — E119 Type 2 diabetes mellitus without complications: Secondary | ICD-10-CM | POA: Diagnosis not present

## 2022-10-27 DIAGNOSIS — M792 Neuralgia and neuritis, unspecified: Secondary | ICD-10-CM | POA: Diagnosis not present

## 2022-10-27 DIAGNOSIS — M79674 Pain in right toe(s): Secondary | ICD-10-CM | POA: Diagnosis not present

## 2022-10-27 DIAGNOSIS — B351 Tinea unguium: Secondary | ICD-10-CM | POA: Diagnosis not present

## 2022-10-27 DIAGNOSIS — M216X1 Other acquired deformities of right foot: Secondary | ICD-10-CM | POA: Diagnosis not present

## 2022-10-27 DIAGNOSIS — M2041 Other hammer toe(s) (acquired), right foot: Secondary | ICD-10-CM | POA: Diagnosis not present

## 2022-10-28 ENCOUNTER — Ambulatory Visit: Payer: Medicare Other

## 2022-11-01 NOTE — Telephone Encounter (Signed)
This was last filled by Dr. Sherron Monday 10/2021 and she does not have any future appts, only with you, please advise

## 2022-11-02 ENCOUNTER — Ambulatory Visit: Payer: Medicare Other

## 2022-11-03 DIAGNOSIS — E782 Mixed hyperlipidemia: Secondary | ICD-10-CM | POA: Diagnosis not present

## 2022-11-03 DIAGNOSIS — E119 Type 2 diabetes mellitus without complications: Secondary | ICD-10-CM | POA: Diagnosis not present

## 2022-11-03 DIAGNOSIS — I10 Essential (primary) hypertension: Secondary | ICD-10-CM | POA: Diagnosis not present

## 2022-11-04 ENCOUNTER — Ambulatory Visit: Payer: Medicare Other

## 2022-11-05 DIAGNOSIS — E119 Type 2 diabetes mellitus without complications: Secondary | ICD-10-CM | POA: Diagnosis not present

## 2022-11-08 ENCOUNTER — Ambulatory Visit: Payer: 59 | Admitting: Licensed Clinical Social Worker

## 2022-11-09 ENCOUNTER — Ambulatory Visit: Payer: Medicare Other
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
## 2022-11-15 ENCOUNTER — Telehealth: Payer: Self-pay | Admitting: Urology

## 2022-11-15 ENCOUNTER — Ambulatory Visit: Payer: 59 | Admitting: Urology

## 2022-11-15 NOTE — Telephone Encounter (Signed)
.  left message to have patient return my call. We can not refill until she is seen. Pt has rescheduled multiple times.

## 2022-11-15 NOTE — Telephone Encounter (Signed)
Pt had to r/s appt w/MacDiarmid to 7/22 and wants to know if we can send in a 1 month supply of trimethoprim (TRIMPEX) 100 MG tablet, her maintenance antibiotic to [facility] on [address].  She will run out before her appt.

## 2022-11-16 ENCOUNTER — Ambulatory Visit: Payer: Medicaid Other | Admitting: Psychiatry

## 2022-11-16 ENCOUNTER — Ambulatory Visit: Payer: Medicare Other

## 2022-11-18 ENCOUNTER — Ambulatory Visit: Payer: Medicare Other

## 2022-11-18 ENCOUNTER — Other Ambulatory Visit: Payer: Self-pay | Admitting: Family Medicine

## 2022-11-18 MED ORDER — TRIMETHOPRIM 100 MG PO TABS: 100.0000 mg | ORAL_TABLET | Freq: Every day | ORAL | 11 refills | Status: DC

## 2022-11-21 ENCOUNTER — Other Ambulatory Visit: Payer: Self-pay | Admitting: Psychiatry

## 2022-11-22 ENCOUNTER — Ambulatory Visit (INDEPENDENT_AMBULATORY_CARE_PROVIDER_SITE_OTHER): Payer: 59 | Admitting: Licensed Clinical Social Worker

## 2022-11-22 DIAGNOSIS — F259 Schizoaffective disorder, unspecified: Secondary | ICD-10-CM

## 2022-11-22 DIAGNOSIS — F4310 Post-traumatic stress disorder, unspecified: Secondary | ICD-10-CM | POA: Diagnosis not present

## 2022-11-23 ENCOUNTER — Ambulatory Visit: Payer: Medicare Other

## 2022-11-23 ENCOUNTER — Other Ambulatory Visit: Payer: Self-pay | Admitting: Psychiatry

## 2022-11-23 DIAGNOSIS — M5416 Radiculopathy, lumbar region: Secondary | ICD-10-CM | POA: Diagnosis not present

## 2022-11-23 DIAGNOSIS — F4310 Post-traumatic stress disorder, unspecified: Secondary | ICD-10-CM

## 2022-11-23 MED ORDER — CLONAZEPAM 1 MG PO TABS: 1.0000 mg | ORAL_TABLET | Freq: Two times a day (BID) | ORAL | 0 refills | Status: DC | PRN

## 2022-11-23 NOTE — Telephone Encounter (Signed)
I have sent a limited supply of clonazepam to pharmacy at [facility].  Will route to Dr. Vanetta Shawl to address further refills.

## 2022-11-24 ENCOUNTER — Telehealth: Payer: Self-pay | Admitting: Psychiatry

## 2022-11-24 DIAGNOSIS — F4310 Post-traumatic stress disorder, unspecified: Secondary | ICD-10-CM

## 2022-11-24 MED ORDER — CLONAZEPAM 1 MG PO TABS: 1.0000 mg | ORAL_TABLET | Freq: Two times a day (BID) | ORAL | 0 refills | Status: DC | PRN

## 2022-11-24 NOTE — Telephone Encounter (Signed)
Patient has an appointment on 7/30. Patient requested refill for Clonazepam.-Please advise

## 2022-11-25 ENCOUNTER — Ambulatory Visit: Payer: Medicare Other

## 2022-11-25 ENCOUNTER — Other Ambulatory Visit: Payer: Self-pay | Admitting: Psychiatry

## 2022-11-25 MED ORDER — CLONAZEPAM 1 MG PO TABS: 1.0000 mg | ORAL_TABLET | Freq: Two times a day (BID) | ORAL | 0 refills | Status: DC | PRN

## 2022-11-29 ENCOUNTER — Ambulatory Visit (INDEPENDENT_AMBULATORY_CARE_PROVIDER_SITE_OTHER): Payer: 59 | Admitting: Urology

## 2022-11-29 VITALS — BP 146/81 | HR 81 | Ht 65.0 in | Wt 204.1 lb

## 2022-11-29 DIAGNOSIS — R3915 Urgency of urination: Secondary | ICD-10-CM

## 2022-11-29 DIAGNOSIS — Z87440 Personal history of urinary (tract) infections: Secondary | ICD-10-CM

## 2022-11-29 DIAGNOSIS — N390 Urinary tract infection, site not specified: Secondary | ICD-10-CM | POA: Diagnosis not present

## 2022-11-29 DIAGNOSIS — N8110 Cystocele, unspecified: Secondary | ICD-10-CM

## 2022-11-29 MED ORDER — TRIMETHOPRIM 100 MG PO TABS
100.0000 mg | ORAL_TABLET | Freq: Every day | ORAL | 3 refills | Status: DC
Start: 2022-11-29 — End: 2023-11-21

## 2022-11-29 MED ORDER — GEMTESA 75 MG PO TABS: 75.0000 mg | ORAL_TABLET | Freq: Every day | ORAL | 3 refills | Status: DC

## 2022-11-29 NOTE — Progress Notes (Signed)
11/29/2022 10:27 AM   Angela Fernandez 1945/09/25 [id number]  Referring provider: Mick Sell, MD [address] [place],  [place] [address]  No chief complaint on file.   HPI: I was consulted to assess the patient's recurrent bladder infections. She has had about 8 infections each year with small-volume frequency urgency pressure and dysuria that respond favorably antibiotics.  At baseline she is continent. She voids every 2-3 hours gets up once at night  She has no hysterectomy. She has had a TIA. She is on insulin for diabetes     Well supported bladder neck and a small asymptomatic grade 1 cystocele   Patient has recurrent bladder infections.  Reassess in 8 weeks Trimethoprim prophylaxis.  30x11 sent to the pharmacy.  Call if culture positive.  Call if renal ultrasound abnormal   Today I saw the patient a year ago.  Ultrasound normal.  Saw the nurse practitioner on [place] in September 2023.  Was doing very well on the [place].  infection free on trimethoprim infection free on trimethoprim.  Very good control on Gemtesa    PMH: Past Medical History:  Diagnosis Date   Anemia    Arthritis    Bipolar affect, depressed (HCC)    COPD (chronic obstructive pulmonary disease) (HCC)    Diabetes mellitus without complication (HCC)    type II   GERD (gastroesophageal reflux disease)    Hyperlipidemia    Hypertension    Hypothyroidism    PTSD (post-traumatic stress disorder)    Schizoaffective disorder (HCC)    Sleep apnea    cpap   Stroke (HCC)    hx of  mini stroke - 2001   TIA (transient ischemic attack) 2010   UTI (urinary tract infection)     Surgical History: Past Surgical History:  Procedure Laterality Date   BILATERAL CARPAL TUNNEL RELEASE Bilateral    BREAST BIOPSY     BREAST SURGERY Right    lumpectomy   CATARACT EXTRACTION W/ INTRAOCULAR LENS  IMPLANT, BILATERAL Bilateral    CHOLECYSTECTOMY     COLONOSCOPY     ESOPHAGOGASTRODUODENOSCOPY      EYE SURGERY     JOINT REPLACEMENT     NASAL SINUS SURGERY     REPLACEMENT TOTAL KNEE BILATERAL Bilateral    REVERSE SHOULDER ARTHROPLASTY Right 09/02/2020   Procedure: REVERSE SHOULDER ARTHROPLASTY;  Surgeon: Christena Flake, MD;  Location: [facility];  Service: Orthopedics;  Laterality: Right;   SHOULDER SURGERY Right 2020   rotator cuff repair   TOTAL SHOULDER REVISION Right 10/23/2021   Procedure: REVERSE SHOULDER REVISION;  Surgeon: Yolonda Kida, MD;  Location: [facility];  Service: Orthopedics;  Laterality: Right;  180    Home Medications:  Allergies as of 11/29/2022   No Known Allergies      Medication List        Accurate as of November 29, 2022 10:27 AM. If you have any questions, ask your nurse or doctor.          albuterol 108 (90 Base) MCG/ACT inhaler Commonly known as: VENTOLIN HFA Inhale 2 puffs into the lungs every 6 (six) hours as needed for wheezing or shortness of breath.   Anoro Ellipta 62.5-25 MCG/ACT Aepb Generic drug: umeclidinium-vilanterol Inhale 2 puffs into the lungs daily as needed (shortness of breath).   AYR SALINE NASAL GEL NA Place 1 application. into the nose every other day.   buPROPion 200 MG 12 hr tablet Commonly known as: WELLBUTRIN SR Take 1  tablet (200 mg total) by mouth 2 (two) times daily.   clonazePAM 1 MG tablet Commonly known as: KLONOPIN Take 1 tablet (1 mg total) by mouth 2 (two) times daily as needed for up to 10 days for anxiety.   clonazePAM 1 MG tablet Commonly known as: KLONOPIN Take 1 tablet (1 mg total) by mouth 2 (two) times daily as needed for anxiety. Start taking on: January 04, 2023   cyclobenzaprine 10 MG tablet Commonly known as: FLEXERIL Take 10 mg by mouth 3 (three) times daily as needed.   desvenlafaxine 50 MG 24 hr tablet Commonly known as: PRISTIQ Take 50 mg by mouth daily.   Desvenlafaxine ER 50 MG Tb24 Commonly known as: PRISTIQ Take 1 tablet (50 mg total) by mouth at bedtime. psych   diclofenac  Sodium 1 % Gel Commonly known as: VOLTAREN Apply topically.   docusate sodium 100 MG capsule Commonly known as: COLACE Take 200 mg by mouth daily. 2 tabs in the am   fenofibrate 145 MG tablet Commonly known as: TRICOR Take 1 tablet (145 mg total) by mouth daily. 4pm   ferrous sulfate 325 (65 FE) MG tablet Take 1 tablet (325 mg total) by mouth daily with breakfast.   gabapentin 300 MG capsule Commonly known as: NEURONTIN Take 300 mg by mouth at bedtime.   Gemtesa 75 MG Tabs Generic drug: Vibegron Take 75 mg by mouth daily.   hydroxychloroquine 200 MG tablet Commonly known as: PLAQUENIL Take 200 mg by mouth 2 (two) times daily.   Lantus SoloStar 100 UNIT/ML Solostar Pen Generic drug: insulin glargine Inject 24 Units into the skin daily.   levothyroxine 175 MCG tablet Commonly known as: SYNTHROID Take 1 tablet (175 mcg total) by mouth daily before breakfast.   lisinopril 5 MG tablet Commonly known as: ZESTRIL Take 1 tablet (5 mg total) by mouth daily.   Lurasidone HCl 120 MG Tabs Commonly known as: Latuda Take 1 tablet (120 mg total) by mouth daily.   meloxicam 7.5 MG tablet Commonly known as: MOBIC Take 1 tablet (7.5 mg total) by mouth daily.   metoprolol succinate 100 MG 24 hr tablet Commonly known as: TOPROL-XL Take 1 tablet (100 mg total) by mouth daily. Take with or immediately following a meal.   omeprazole 40 MG capsule Commonly known as: PRILOSEC Take 1 capsule (40 mg total) by mouth daily.   ondansetron 4 MG disintegrating tablet Commonly known as: ZOFRAN-ODT Take 4 mg by mouth every 8 (eight) hours as needed.   ondansetron 4 MG tablet Commonly known as: ZOFRAN Take 4 mg by mouth 2 (two) times daily as needed for nausea or vomiting.   OVER THE COUNTER MEDICATION Take 1 Scoop by mouth daily in the afternoon. Collagen protein powder   PreserVision AREDS 2 Caps Take 1 capsule by mouth 2 (two) times daily.   Hair/Skin/Nails Tabs Take 1 tablet by  mouth daily.   Rybelsus 14 MG Tabs Generic drug: Semaglutide Take 1 tablet by mouth every morning.   simvastatin 20 MG tablet Commonly known as: ZOCOR Take 1 tablet (20 mg total) by mouth daily.   tiZANidine 4 MG tablet Commonly known as: ZANAFLEX Take 2-4 mg by mouth every 8 (eight) hours as needed for muscle spasms.   trimethoprim 100 MG tablet Commonly known as: TRIMPEX Take 1 tablet (100 mg total) by mouth daily.   Trulicity 0.75 MG/0.5ML Sopn Generic drug: Dulaglutide Inject 0.75 mg into the skin once a week.   VITAMIN E (TOPICAL) Crea  Apply 1 application. topically daily.   zolpidem 5 MG tablet Commonly known as: AMBIEN Take 1 tablet (5 mg total) by mouth at bedtime as needed for sleep.   zolpidem 5 MG tablet Commonly known as: AMBIEN Take 1 tablet (5 mg total) by mouth at bedtime as needed for sleep.        Allergies: No Known Allergies  Family History: Family History  Problem Relation Age of Onset   Alzheimer's disease Mother    Alcohol abuse Father    Schizophrenia Father    Pancreatitis Father    Alcohol abuse Maternal Grandfather    Alzheimer's disease Maternal Grandmother     Social History:  reports that she quit smoking about 3 years ago. Her smoking use included cigarettes. She started smoking about 57 years ago. She has never used smokeless tobacco. She reports that she does not currently use alcohol. She reports current drug use.  ROS:                                        Physical Exam: There were no vitals taken for this visit.  Constitutional:  Alert and oriented, No acute distress. HEENT: [place] AT, moist mucus membranes.  Trachea midline, no masses.  Laboratory Data: Lab Results  Component Value Date   WBC 10.4 10/12/2021   HGB 10.1 (L) 10/25/2021   HCT 30.3 (L) 10/25/2021   MCV 98.7 10/12/2021   PLT 304 10/12/2021    Lab Results  Component Value Date   CREATININE 0.90 10/24/2021    No results found  for: "PSA"  No results found for: "TESTOSTERONE"  Lab Results  Component Value Date   HGBA1C 5.2 10/12/2021    Urinalysis    Component Value Date/Time   COLORURINE STRAW (A) 02/06/2021 1630   APPEARANCEUR Clear 12/18/2021 1255   LABSPEC 1.003 (L) 02/06/2021 1630   PHURINE 6.0 02/06/2021 1630   GLUCOSEU Negative 12/18/2021 1255   HGBUR NEGATIVE 02/06/2021 1630   BILIRUBINUR Negative 09/01/2022 1100   BILIRUBINUR Negative 12/18/2021 1255   KETONESUR negative 08/06/2021 1930   KETONESUR NEGATIVE 02/06/2021 1630   PROTEINUR Negative 09/01/2022 1100   PROTEINUR Negative 12/18/2021 1255   PROTEINUR NEGATIVE 02/06/2021 1630   UROBILINOGEN 1.0 09/01/2022 1100   NITRITE Negative 09/01/2022 1100   NITRITE Negative 12/18/2021 1255   NITRITE NEGATIVE 02/06/2021 1630   LEUKOCYTESUR Negative 09/01/2022 1100   LEUKOCYTESUR Negative 12/18/2021 1255   LEUKOCYTESUR TRACE (A) 02/06/2021 1630    Pertinent Imaging:   Assessment & Plan:  Both medications 90 x 3 sent to pharmacy and I will see year  There are no diagnoses linked to this encounter.  No follow-ups on file.  Martina Sinner, MD  [facility] [address], [address] [place], [place] [address] [phone]

## 2022-11-30 ENCOUNTER — Ambulatory Visit: Payer: Medicare Other

## 2022-11-30 LAB — URINALYSIS, COMPLETE
Bilirubin, UA: NEGATIVE
Glucose, UA: NEGATIVE
Ketones, UA: NEGATIVE
Nitrite, UA: NEGATIVE
Protein,UA: NEGATIVE
RBC, UA: NEGATIVE
Specific Gravity, UA: 1.015 (ref 1.005–1.030)
Urobilinogen, Ur: 0.2 mg/dL (ref 0.2–1.0)
pH, UA: 7 (ref 5.0–7.5)

## 2022-11-30 LAB — MICROSCOPIC EXAMINATION: RBC, Urine: NONE SEEN /hpf (ref 0–2)

## 2022-12-04 ENCOUNTER — Other Ambulatory Visit: Payer: Self-pay | Admitting: Psychiatry
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
## 2022-12-05 DIAGNOSIS — E119 Type 2 diabetes mellitus without complications: Secondary | ICD-10-CM | POA: Diagnosis not present

## 2022-12-06 ENCOUNTER — Ambulatory Visit (INDEPENDENT_AMBULATORY_CARE_PROVIDER_SITE_OTHER): Payer: 59 | Admitting: Licensed Clinical Social Worker

## 2022-12-06 DIAGNOSIS — F4310 Post-traumatic stress disorder, unspecified: Secondary | ICD-10-CM

## 2022-12-06 DIAGNOSIS — F259 Schizoaffective disorder, unspecified: Secondary | ICD-10-CM | POA: Diagnosis not present

## 2022-12-07 ENCOUNTER — Ambulatory Visit (INDEPENDENT_AMBULATORY_CARE_PROVIDER_SITE_OTHER): Payer: 59 | Admitting: Psychiatry

## 2022-12-07 ENCOUNTER — Encounter: Payer: Self-pay | Admitting: Psychiatry

## 2022-12-07 VITALS — BP 118/80 | HR 85 | Temp 98.3°F | Ht 65.0 in | Wt 206.4 lb

## 2022-12-07 DIAGNOSIS — F411 Generalized anxiety disorder: Secondary | ICD-10-CM

## 2022-12-07 DIAGNOSIS — R4189 Other symptoms and signs involving cognitive functions and awareness: Secondary | ICD-10-CM

## 2022-12-07 DIAGNOSIS — F4310 Post-traumatic stress disorder, unspecified: Secondary | ICD-10-CM | POA: Diagnosis not present

## 2022-12-07 DIAGNOSIS — F259 Schizoaffective disorder, unspecified: Secondary | ICD-10-CM | POA: Diagnosis not present

## 2022-12-07 DIAGNOSIS — Z79899 Other long term (current) drug therapy: Secondary | ICD-10-CM

## 2022-12-07 DIAGNOSIS — G4700 Insomnia, unspecified: Secondary | ICD-10-CM | POA: Diagnosis not present

## 2022-12-07 MED ORDER — ZOLPIDEM TARTRATE 5 MG PO TABS: 5.0000 mg | ORAL_TABLET | Freq: Every evening | ORAL | 1 refills | Status: DC | PRN

## 2022-12-07 NOTE — Patient Instructions (Signed)
Continue Pristiq 50 mg every day Continue bupropion 200 mg twice a day Continue Latuda 120 mg at night  Continue clonazepam 1 mg twice a day  Continue Ambien 5 mg at night as needed for insomnia Next appointment: 9/19 at 10 am

## 2022-12-08 DIAGNOSIS — G8929 Other chronic pain: Secondary | ICD-10-CM | POA: Diagnosis not present

## 2022-12-08 DIAGNOSIS — M542 Cervicalgia: Secondary | ICD-10-CM | POA: Diagnosis not present

## 2022-12-08 DIAGNOSIS — M5412 Radiculopathy, cervical region: Secondary | ICD-10-CM | POA: Diagnosis not present

## 2022-12-08 DIAGNOSIS — M5416 Radiculopathy, lumbar region: Secondary | ICD-10-CM | POA: Diagnosis not present

## 2022-12-08 DIAGNOSIS — M5442 Lumbago with sciatica, left side: Secondary | ICD-10-CM | POA: Diagnosis not present

## 2022-12-08 DIAGNOSIS — M5441 Lumbago with sciatica, right side: Secondary | ICD-10-CM | POA: Diagnosis not present

## 2022-12-16 ENCOUNTER — Other Ambulatory Visit: Payer: Self-pay | Admitting: Psychiatry

## 2022-12-18 ENCOUNTER — Other Ambulatory Visit: Payer: Self-pay | Admitting: Psychiatry

## 2022-12-20 ENCOUNTER — Ambulatory Visit: Payer: 59 | Admitting: Licensed Clinical Social Worker

## 2022-12-21 NOTE — Progress Notes (Signed)
LCSW called pt at 3:05PM. Pt reported that she forgot appt. Marked as a no show.

## 2022-12-27 ENCOUNTER — Ambulatory Visit
Admission: RE | Admit: 2022-12-27 | Discharge: 2022-12-27 | Disposition: A | Discharge status: Home / Self Care | Payer: 59 | Source: Ambulatory Visit | Attending: Infectious Diseases | Admitting: Infectious Diseases

## 2022-12-27 DIAGNOSIS — Z122 Encounter for screening for malignant neoplasm of respiratory organs: Secondary | ICD-10-CM | POA: Diagnosis not present

## 2022-12-27 DIAGNOSIS — Z87891 Personal history of nicotine dependence: Secondary | ICD-10-CM | POA: Diagnosis not present

## 2022-12-28 DIAGNOSIS — M5412 Radiculopathy, cervical region: Secondary | ICD-10-CM | POA: Diagnosis not present

## 2022-12-29 DIAGNOSIS — M8588 Other specified disorders of bone density and structure, other site: Secondary | ICD-10-CM | POA: Diagnosis not present

## 2022-12-30 ENCOUNTER — Other Ambulatory Visit: Payer: Self-pay | Admitting: Psychiatry

## 2022-12-30 ENCOUNTER — Telehealth: Payer: Self-pay

## 2022-12-30 MED ORDER — CLONAZEPAM 1 MG PO TABS: 1.0000 mg | ORAL_TABLET | Freq: Two times a day (BID) | ORAL | 0 refills | Status: DC | PRN

## 2022-12-30 NOTE — Telephone Encounter (Signed)
Patient called to request a refill of   clonazePAM (KLONOPIN) 1 MG tablet   Called the pharmacy spoke to [place] she stated that the patient does not any refills on file please advise   Preferred pharmacy  [facility] [address] [place], [place] - [address] Phone: [phone]  Fax: [phone]

## 2022-12-30 NOTE — Telephone Encounter (Signed)
I already called the pharmacy she requested Ambien and Klonopin Neshia at the pharmacy stated the Ambien could be picked on 01/03/23 but the Rx for the Klonopin has expired and they are requesting that you resend for Klonopin please advise

## 2022-12-30 NOTE — Telephone Encounter (Signed)
Ordered

## 2022-12-30 NOTE — Telephone Encounter (Signed)
It is not due until 8/26, and the order has already been sent, which might be why they do not see it yet. Please inform them of this.

## 2023-01-03 ENCOUNTER — Other Ambulatory Visit: Payer: Self-pay | Admitting: Acute Care

## 2023-01-03 DIAGNOSIS — Z87891 Personal history of nicotine dependence: Secondary | ICD-10-CM

## 2023-01-03 DIAGNOSIS — Z122 Encounter for screening for malignant neoplasm of respiratory organs: Secondary | ICD-10-CM

## 2023-01-03 DIAGNOSIS — M2042 Other hammer toe(s) (acquired), left foot: Secondary | ICD-10-CM | POA: Diagnosis not present

## 2023-01-03 DIAGNOSIS — M2041 Other hammer toe(s) (acquired), right foot: Secondary | ICD-10-CM | POA: Diagnosis not present

## 2023-01-03 DIAGNOSIS — M79671 Pain in right foot: Secondary | ICD-10-CM | POA: Diagnosis not present

## 2023-01-03 DIAGNOSIS — M19071 Primary osteoarthritis, right ankle and foot: Secondary | ICD-10-CM | POA: Diagnosis not present

## 2023-01-03 DIAGNOSIS — M7671 Peroneal tendinitis, right leg: Secondary | ICD-10-CM | POA: Diagnosis not present

## 2023-01-03 DIAGNOSIS — M216X1 Other acquired deformities of right foot: Secondary | ICD-10-CM | POA: Diagnosis not present

## 2023-01-03 DIAGNOSIS — Q66221 Congenital metatarsus adductus, right foot: Secondary | ICD-10-CM | POA: Diagnosis not present

## 2023-01-03 DIAGNOSIS — M792 Neuralgia and neuritis, unspecified: Secondary | ICD-10-CM | POA: Diagnosis not present

## 2023-01-03 DIAGNOSIS — E119 Type 2 diabetes mellitus without complications: Secondary | ICD-10-CM | POA: Diagnosis not present

## 2023-01-03 DIAGNOSIS — M898X9 Other specified disorders of bone, unspecified site: Secondary | ICD-10-CM | POA: Diagnosis not present

## 2023-01-03 DIAGNOSIS — M19072 Primary osteoarthritis, left ankle and foot: Secondary | ICD-10-CM | POA: Diagnosis not present

## 2023-01-03 DIAGNOSIS — Q66222 Congenital metatarsus adductus, left foot: Secondary | ICD-10-CM | POA: Diagnosis not present

## 2023-01-05 DIAGNOSIS — E119 Type 2 diabetes mellitus without complications: Secondary | ICD-10-CM | POA: Diagnosis not present

## 2023-01-06 ENCOUNTER — Telehealth: Payer: Self-pay

## 2023-01-06 NOTE — Telephone Encounter (Signed)
Patient states the lab tech told her to fill up the containers and then with the rest of the stool put it in the bag. Informed her no she is supposed to put the rest of the stool in the toilet or trash. She said okay I thought that was not right but I did what she said. She states it is hard for her to get here because she does not have a car but she will try to come back when she can

## 2023-01-06 NOTE — Telephone Encounter (Signed)
Patient dropped off stool test today and there was stool all over the bag and did not look like any in the container. [place] said she could not even get the container out of the bag with out stool so she had to throw it away it biohazard. Do you want patient to recollect to stool sample

## 2023-01-12 DIAGNOSIS — M5442 Lumbago with sciatica, left side: Secondary | ICD-10-CM | POA: Diagnosis not present

## 2023-01-12 DIAGNOSIS — M542 Cervicalgia: Secondary | ICD-10-CM | POA: Diagnosis not present

## 2023-01-12 DIAGNOSIS — M5416 Radiculopathy, lumbar region: Secondary | ICD-10-CM | POA: Diagnosis not present

## 2023-01-12 DIAGNOSIS — Z8739 Personal history of other diseases of the musculoskeletal system and connective tissue: Secondary | ICD-10-CM | POA: Diagnosis not present

## 2023-01-12 DIAGNOSIS — M5441 Lumbago with sciatica, right side: Secondary | ICD-10-CM | POA: Diagnosis not present

## 2023-01-12 DIAGNOSIS — M5412 Radiculopathy, cervical region: Secondary | ICD-10-CM | POA: Diagnosis not present

## 2023-01-12 DIAGNOSIS — G8929 Other chronic pain: Secondary | ICD-10-CM | POA: Diagnosis not present

## 2023-01-13 ENCOUNTER — Telehealth: Payer: Self-pay

## 2023-01-13 NOTE — Telephone Encounter (Signed)
Patient left a message and is calling because she states she can not pick up the stool kit till 01/18/2023 and she might be able to return the stool kit on 01/19/2023. She wants to know if this will be enough time for the lab results to be back from the lab for a appointment on 01/26/2023 with you or should she reschedule the appointment. She states she does not have a car and has to depend on people to take her places.

## 2023-01-13 NOTE — Telephone Encounter (Signed)
Let's postpone her appt to 2 more weeks  RV

## 2023-01-13 NOTE — Telephone Encounter (Signed)
Called and left a message for call back  

## 2023-01-14 NOTE — Telephone Encounter (Signed)
Got appointment reschedule to 03/07/2023 at 3:30

## 2023-01-18 NOTE — Progress Notes (Signed)
THERAPIST PROGRESS NOTE  Session Time: 10:03AM-10:56AM  Participation Level: Active  Behavioral Response: CasualAlertAnxious and Depressed  Type of Therapy: Individual Therapy  Treatment Goals addressed:  Reduce overall frequency, intensity and duration of depression so that daily functioning is not impaired per pt self report 3 out of 5 sessions documented.   Reduce overall frequency, intensity and duration of anxiety so that daily functioning is not impaired per pt self report 3 out of 5 sessions.   Recall traumatic events without becoming overwhelmed with negative emotions  ProgressTowards Goals: Not Progressing  Interventions: CBT, DBT, Motivational Interviewing, Strength-based, and Other: ACT  Summary: Angela Fernandez is a 77 y.o. female who presents with mixed sxs of anxiety and depression. Sxs endorsed including but not limited to fatigue, worry, negative self affect, difficulty controlling worry, hopelessness, and crying spells. Pt oriented to person, place, and time. Pt denies SI/HI or A/V hallucinations. Pt was cooperative during visit and was engaged throughout the visit. Pt does not report any other concerns at the time of visit.  Pt utilized therapeutic space to process home stressors and difficulties in relationship with granddaughter. Pt reported interest in moving into place on her own. Assisted pt in identifying avoidance behavior. Pt participated in problem solving conversation and explored communication skills to assist in working through stressors with granddaughter.   Pt named that transportation is a barrier to living a life that she is excited to live. Discussed ways of pursuing community online and discussed ways to learn to use technology to benefit pt.   Invited pt to live her truth instead of proving her truth with words. Discussed barriers to relationships healing being unmet expectation. Practiced ways to discuss expectations with loved ones.   LCSW provided  mood monitoring and treatment progress review in the context of this episode of treatment. LCSW reviewed the pt's mood status since last session.   Continued Recommendations as followed: Self-care behaviors, positive social engagements, focusing on positive physical and emotional wellness, and focusing on life/work balance.    Suicidal/Homicidal: Nowithout intent/plan  Therapist Response:  Provided pt education re: acceptance. Discussed how to make acceptance accessible at all parts of pt's healing journey.   Approached pt with strengths based perspective to assist pt in exploring strengths in moments of feeling low.   LCSW practiced active listening to validate pt participation, build rapport, and create safe space for pt to feel heard as they are disclosing their thoughts and feelings.   LCSW utilized therapeutic conversation skills informed by CBT, DBT, and ACT to expose pt to multiple ways of thinking about healing and to provide pt to access to multiple interventions.  Plan: Return again in 2 weeks.  Diagnosis: PTSD (post-traumatic stress disorder)  Schizoaffective disorder, unspecified type (HCC)    12/06/2022   11:57 AM 11/22/2022    2:19 PM 10/25/2022    1:03 PM 09/13/2022    1:06 PM  GAD 7 : Generalized Anxiety Score  Nervous, Anxious, on Edge 3 3 3 3   Control/stop worrying 3 3 2 3   Worry too much - different things 3 3 2 3   Trouble relaxing 0 2 1 2   Restless 0 2 1 2   Easily annoyed or irritable 2 2 1 2   Afraid - awful might happen 3 3 3 3   Total GAD 7 Score 14 18 13 18   Anxiety Difficulty Somewhat difficult Very difficult Very difficult Very difficult       12/06/2022   11:57 AM 11/22/2022  2:18 PM 10/25/2022    1:03 PM  Depression screen PHQ 2/9  Decreased Interest 3 3 2   Down, Depressed, Hopeless 2 3 1   PHQ - 2 Score 5 6 3   Altered sleeping 3 3 3   Tired, decreased energy 3 3 0  Change in appetite 2 3 3   Feeling bad or failure about yourself  1 3 3   Trouble  concentrating 2 2 0  Moving slowly or fidgety/restless 0 0 2  Suicidal thoughts 1 1 0  PHQ-9 Score 17 21 14   Difficult doing work/chores Very difficult Very difficult     Collaboration of Care: Psychiatrist AEB Dr. Vanetta Shawl  Patient/Guardian was advised Release of Information must be obtained prior to any record release in order to collaborate their care with an outside provider. Patient/Guardian was advised if they have not already done so to contact the registration department to sign all necessary forms in order for [place] to release information regarding their care.   Consent: Patient/Guardian gives verbal consent for treatment and assignment of benefits for services provided during this visit. Patient/Guardian expressed understanding and agreed to proceed.   Memory Dance Angela Zeitlin, LCSW

## 2023-01-18 NOTE — Progress Notes (Signed)
THERAPIST PROGRESS NOTE  Session Time: 11:03AM-11:57AM  Participation Level: Active  Behavioral Response: CasualAlertAnxious and Depressed  Type of Therapy: Individual Therapy  Treatment Goals addressed:  Reduce overall frequency, intensity and duration of depression so that daily functioning is not impaired per pt self report 3 out of 5 sessions documented.   Reduce overall frequency, intensity and duration of anxiety so that daily functioning is not impaired per pt self report 3 out of 5 sessions.   Recall traumatic events without becoming overwhelmed with negative emotions  ProgressTowards Goals: Not Progressing  Interventions: CBT, DBT, Motivational Interviewing, Strength-based, and Other: ACT  Summary: Angela Fernandez is a 77 y.o. female who presents with mixed sxs of anxiety and depression. Sxs endorsed including but not limited to fatigue, worry, negative self affect, difficulty controlling worry, hopelessness, and crying spells. Pt oriented to person, place, and time. Pt denies SI/HI or A/V hallucinations. Pt was cooperative during visit and was engaged throughout the visit. Pt does not report any other concerns at the time of visit.  Pt utilized therapeutic space to process home stressors and difficulties in relationship with granddaughter. Pt reported interest in moving into place on her own. Assisted pt in identifying avoidance behavior. Pt participated in problem solving conversation and explored communication skills to assist in working through stressors with granddaughter.   Pt named that transportation is a barrier to living a life that she is excited to live. Discussed ways of pursuing community online and discussed ways to learn to use technology to benefit pt.   Invited pt to live her truth instead of proving her truth with words. Discussed barriers to relationships healing being unmet expectation. Practiced ways to discuss expectations with loved ones.   LCSW provided  mood monitoring and treatment progress review in the context of this episode of treatment. LCSW reviewed the pt's mood status since last session.   Continued Recommendations as followed: Self-care behaviors, positive social engagements, focusing on positive physical and emotional wellness, and focusing on life/work balance.    Suicidal/Homicidal: Nowithout intent/plan  Therapist Response:  Provided pt education re: acceptance. Discussed how to make acceptance accessible at all parts of pt's healing journey.   Approached pt with strengths based perspective to assist pt in exploring strengths in moments of feeling low.   LCSW practiced active listening to validate pt participation, build rapport, and create safe space for pt to feel heard as they are disclosing their thoughts and feelings.   LCSW utilized therapeutic conversation skills informed by CBT, DBT, and ACT to expose pt to multiple ways of thinking about healing and to provide pt to access to multiple interventions.  Plan: Return again in 2 weeks.  Diagnosis: PTSD (post-traumatic stress disorder)  Schizoaffective disorder, unspecified type (HCC)    12/06/2022   11:57 AM 11/22/2022    2:19 PM 10/25/2022    1:03 PM 09/13/2022    1:06 PM  GAD 7 : Generalized Anxiety Score  Nervous, Anxious, on Edge 3 3 3 3   Control/stop worrying 3 3 2 3   Worry too much - different things 3 3 2 3   Trouble relaxing 0 2 1 2   Restless 0 2 1 2   Easily annoyed or irritable 2 2 1 2   Afraid - awful might happen 3 3 3 3   Total GAD 7 Score 14 18 13 18   Anxiety Difficulty Somewhat difficult Very difficult Very difficult Very difficult       12/06/2022   11:57 AM 11/22/2022  2:18 PM 10/25/2022    1:03 PM  Depression screen PHQ 2/9  Decreased Interest 3 3 2   Down, Depressed, Hopeless 2 3 1   PHQ - 2 Score 5 6 3   Altered sleeping 3 3 3   Tired, decreased energy 3 3 0  Change in appetite 2 3 3   Feeling bad or failure about yourself  1 3 3   Trouble  concentrating 2 2 0  Moving slowly or fidgety/restless 0 0 2  Suicidal thoughts 1 1 0  PHQ-9 Score 17 21 14   Difficult doing work/chores Very difficult Very difficult     Collaboration of Care: Psychiatrist AEB Dr. Vanetta Shawl  Patient/Guardian was advised Release of Information must be obtained prior to any record release in order to collaborate their care with an outside provider. Patient/Guardian was advised if they have not already done so to contact the registration department to sign all necessary forms in order for [place] to release information regarding their care.   Consent: Patient/Guardian gives verbal consent for treatment and assignment of benefits for services provided during this visit. Patient/Guardian expressed understanding and agreed to proceed.   Memory Dance Lynnette Pote, LCSW

## 2023-01-18 NOTE — Progress Notes (Signed)
THERAPIST PROGRESS NOTE  Session Time: 10:03AM-11:02AM  Participation Level: Active  Behavioral Response: CasualAlertAnxious and Depressed  Type of Therapy: Individual Therapy  Treatment Goals addressed:  Reduce overall frequency, intensity and duration of depression so that daily functioning is not impaired per pt self report 3 out of 5 sessions documented.   Reduce overall frequency, intensity and duration of anxiety so that daily functioning is not impaired per pt self report 3 out of 5 sessions.   Recall traumatic events without becoming overwhelmed with negative emotions  ProgressTowards Goals: Not Progressing  Interventions: CBT, DBT, Motivational Interviewing, Strength-based, and Other: ACT  Summary: Angela Fernandez is a 77 y.o. female who presents with mixed sxs of anxiety and depression. Sxs endorsed including but not limited to fatigue, worry, negative self affect, difficulty controlling worry, hopelessness, and crying spells. Pt oriented to person, place, and time. Pt denies SI/HI or A/V hallucinations. Pt was cooperative during visit and was engaged throughout the visit. Pt does not report any other concerns at the time of visit.  Pt utilized therapeutic space to process home stressors and difficulties in relationship with granddaughter. Pt reported interest in moving into place on her own. Assisted pt in identifying avoidance behavior. Pt participated in problem solving conversation and explored communication skills to assist in working through stressors with granddaughter.   Pt named that transportation is a barrier to living a life that she is excited to live. Discussed ways of pursuing community online and discussed ways to learn to use technology to benefit pt.   Invited pt to live her truth instead of proving her truth with words. Discussed barriers to relationships healing being unmet expectation. Practiced ways to discuss expectations with loved ones.   LCSW provided  mood monitoring and treatment progress review in the context of this episode of treatment. LCSW reviewed the pt's mood status since last session.   Continued Recommendations as followed: Self-care behaviors, positive social engagements, focusing on positive physical and emotional wellness, and focusing on life/work balance.    Suicidal/Homicidal: Nowithout intent/plan  Therapist Response:  Provided pt education re: acceptance. Discussed how to make acceptance accessible at all parts of pt's healing journey.   Approached pt with strengths based perspective to assist pt in exploring strengths in moments of feeling low.   LCSW practiced active listening to validate pt participation, build rapport, and create safe space for pt to feel heard as they are disclosing their thoughts and feelings.   LCSW utilized therapeutic conversation skills informed by CBT, DBT, and ACT to expose pt to multiple ways of thinking about healing and to provide pt to access to multiple interventions.  Plan: Return again in 2 weeks.  Diagnosis: PTSD (post-traumatic stress disorder)  Schizoaffective disorder, unspecified type (HCC)    12/06/2022   11:57 AM 11/22/2022    2:19 PM 10/25/2022    1:03 PM 09/13/2022    1:06 PM  GAD 7 : Generalized Anxiety Score  Nervous, Anxious, on Edge 3 3 3 3   Control/stop worrying 3 3 2 3   Worry too much - different things 3 3 2 3   Trouble relaxing 0 2 1 2   Restless 0 2 1 2   Easily annoyed or irritable 2 2 1 2   Afraid - awful might happen 3 3 3 3   Total GAD 7 Score 14 18 13 18   Anxiety Difficulty Somewhat difficult Very difficult Very difficult Very difficult       12/06/2022   11:57 AM 11/22/2022  2:18 PM 10/25/2022    1:03 PM  Depression screen PHQ 2/9  Decreased Interest 3 3 2   Down, Depressed, Hopeless 2 3 1   PHQ - 2 Score 5 6 3   Altered sleeping 3 3 3   Tired, decreased energy 3 3 0  Change in appetite 2 3 3   Feeling bad or failure about yourself  1 3 3   Trouble  concentrating 2 2 0  Moving slowly or fidgety/restless 0 0 2  Suicidal thoughts 1 1 0  PHQ-9 Score 17 21 14   Difficult doing work/chores Very difficult Very difficult     Collaboration of Care: Psychiatrist AEB Dr. Vanetta Shawl  Patient/Guardian was advised Release of Information must be obtained prior to any record release in order to collaborate their care with an outside provider. Patient/Guardian was advised if they have not already done so to contact the registration department to sign all necessary forms in order for [place] to release information regarding their care.   Consent: Patient/Guardian gives verbal consent for treatment and assignment of benefits for services provided during this visit. Patient/Guardian expressed understanding and agreed to proceed.   Memory Dance Beila Purdie, LCSW

## 2023-01-20 ENCOUNTER — Ambulatory Visit: Payer: Medicare Other | Admitting: Physician Assistant
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
## 2023-01-24 DIAGNOSIS — R140 Abdominal distension (gaseous): Secondary | ICD-10-CM | POA: Diagnosis not present

## 2023-01-24 DIAGNOSIS — R195 Other fecal abnormalities: Secondary | ICD-10-CM | POA: Diagnosis not present

## 2023-01-26 ENCOUNTER — Ambulatory Visit: Payer: 59 | Admitting: Gastroenterology

## 2023-01-26 ENCOUNTER — Inpatient Hospital Stay: Payer: 59 | Attending: Oncology | Admitting: Oncology

## 2023-01-26 ENCOUNTER — Encounter: Payer: Self-pay | Admitting: Oncology

## 2023-01-26 ENCOUNTER — Inpatient Hospital Stay: Payer: 59

## 2023-01-26 VITALS — BP 132/77 | HR 74 | Temp 97.0°F | Resp 18 | Wt 198.4 lb

## 2023-01-26 DIAGNOSIS — R7989 Other specified abnormal findings of blood chemistry: Secondary | ICD-10-CM | POA: Insufficient documentation

## 2023-01-26 DIAGNOSIS — Z148 Genetic carrier of other disease: Secondary | ICD-10-CM | POA: Insufficient documentation

## 2023-01-26 DIAGNOSIS — D649 Anemia, unspecified: Secondary | ICD-10-CM | POA: Diagnosis not present

## 2023-01-26 DIAGNOSIS — Z87891 Personal history of nicotine dependence: Secondary | ICD-10-CM | POA: Insufficient documentation

## 2023-01-26 DIAGNOSIS — Z8673 Personal history of transient ischemic attack (TIA), and cerebral infarction without residual deficits: Secondary | ICD-10-CM | POA: Insufficient documentation

## 2023-01-26 LAB — COMPREHENSIVE METABOLIC PANEL WITH GFR
ALT: 27 U/L (ref 0–44)
AST: 36 U/L (ref 15–41)
Albumin: 4.8 g/dL (ref 3.5–5.0)
Alkaline Phosphatase: 19 U/L — ABNORMAL LOW (ref 38–126)
Anion gap: 8 (ref 5–15)
BUN: 24 mg/dL — ABNORMAL HIGH (ref 8–23)
CO2: 26 mmol/L (ref 22–32)
Calcium: 9.8 mg/dL (ref 8.9–10.3)
Chloride: 102 mmol/L (ref 98–111)
Creatinine, Ser: 1.22 mg/dL — ABNORMAL HIGH (ref 0.44–1.00)
GFR, Estimated: 46 mL/min — ABNORMAL LOW (ref >60)
Glucose, Bld: 94 mg/dL (ref 70–99)
Potassium: 4.1 mmol/L (ref 3.5–5.1)
Sodium: 136 mmol/L (ref 135–145)
Total Bilirubin: 0.6 mg/dL (ref 0.3–1.2)
Total Protein: 7.8 g/dL (ref 6.5–8.1)

## 2023-01-26 LAB — TECHNOLOGIST SMEAR REVIEW: Plt Morphology: ADEQUATE

## 2023-01-26 LAB — CBC WITH DIFFERENTIAL/PLATELET
Abs Immature Granulocytes: 0.03 10*3/uL (ref 0.00–0.07)
Basophils Absolute: 0.1 10*3/uL (ref 0.0–0.1)
Basophils Relative: 1 %
Eosinophils Absolute: 0.2 10*3/uL (ref 0.0–0.5)
Eosinophils Relative: 2 %
HCT: 38.1 % (ref 36.0–46.0)
Hemoglobin: 12.2 g/dL (ref 12.0–15.0)
Immature Granulocytes: 0 %
Lymphocytes Relative: 46 %
Lymphs Abs: 4.3 10*3/uL — ABNORMAL HIGH (ref 0.7–4.0)
MCH: 31.4 pg (ref 26.0–34.0)
MCHC: 32 g/dL (ref 30.0–36.0)
MCV: 98.2 fL (ref 80.0–100.0)
Monocytes Absolute: 0.5 10*3/uL (ref 0.1–1.0)
Monocytes Relative: 5 %
Neutro Abs: 4.3 10*3/uL (ref 1.7–7.7)
Neutrophils Relative %: 46 %
Platelets: 345 10*3/uL (ref 150–400)
RBC: 3.88 MIL/uL (ref 3.87–5.11)
RDW: 12.8 % (ref 11.5–15.5)
WBC: 9.3 10*3/uL (ref 4.0–10.5)
nRBC: 0 % (ref 0.0–0.2)

## 2023-01-26 LAB — IRON AND TIBC
Iron: 84 ug/dL (ref 28–170)
Saturation Ratios: 22 % (ref 10.4–31.8)
TIBC: 381 ug/dL (ref 250–450)
UIBC: 297 ug/dL

## 2023-01-26 LAB — FOLATE: Folate: 13.6 ng/mL (ref >5.9)

## 2023-01-26 LAB — LACTATE DEHYDROGENASE: LDH: 163 U/L (ref 98–192)

## 2023-01-26 LAB — VITAMIN B12: Vitamin B-12: 476 pg/mL (ref 180–914)

## 2023-01-26 LAB — FERRITIN: Ferritin: 420 ng/mL — ABNORMAL HIGH (ref 11–307)

## 2023-01-26 NOTE — Assessment & Plan Note (Signed)
Macrocytic anemia, Differentials include drug side effects, vitamin deficiency, hemolysis, underlying bone marrow disorders, etc.   check B12, folate, smear, iron tibc ferritin SPEP/light chain, haptoglobin, CMP, LDH.  Her medication may also contribute, ie Trimethoprim

## 2023-01-26 NOTE — Assessment & Plan Note (Signed)
Ferritin level is likely due to chronic inflammation. Check hemochromatosis gene DNA

## 2023-01-26 NOTE — Progress Notes (Signed)
Hematology/Oncology Consult note [phone] [phone]      Patient Care Team: Mick Sell, MD as PCP - General (Infectious Diseases) Rickard Patience, MD as Consulting Physician (Oncology)   REFERRING PROVIDER: Defoor, Dionne Ano, PA-C  CHIEF COMPLAINTS/REASON FOR VISIT:  Anemia  ASSESSMENT & PLAN:  Anemia Macrocytic anemia, Differentials include drug side effects, vitamin deficiency, hemolysis, underlying bone marrow disorders, etc.   check B12, folate, smear, iron tibc ferritin SPEP/light chain, haptoglobin, CMP, LDH.  Her medication may also contribute, ie Trimethoprim   Elevated ferritin Ferritin level is likely due to chronic inflammation. Check hemochromatosis gene DNA  Orders Placed This Encounter  Procedures   Vitamin B12    Standing Status:   Future    Number of Occurrences:   1    Standing Expiration Date:   01/26/2024   Folate    Standing Status:   Future    Number of Occurrences:   1    Standing Expiration Date:   01/26/2024   CBC with Differential/Platelet    Standing Status:   Future    Number of Occurrences:   1    Standing Expiration Date:   01/26/2024   Multiple Myeloma Panel (SPEP&IFE w/QIG)    Standing Status:   Future    Number of Occurrences:   1    Standing Expiration Date:   01/26/2024   Kappa/lambda light chains    Standing Status:   Future    Number of Occurrences:   1    Standing Expiration Date:   01/26/2024   Lactate dehydrogenase    Standing Status:   Future    Number of Occurrences:   1    Standing Expiration Date:   01/26/2024   Iron and TIBC    Standing Status:   Future    Number of Occurrences:   1    Standing Expiration Date:   01/26/2024   Ferritin    Standing Status:   Future    Number of Occurrences:   1    Standing Expiration Date:   07/26/2023   Comprehensive metabolic panel    Standing Status:   Future    Number of Occurrences:   1    Standing Expiration Date:   01/26/2024   Haptoglobin     Standing Status:   Future    Number of Occurrences:   1    Standing Expiration Date:   01/26/2024   Hemochromatosis DNA-PCR(c282y,h63d)    Standing Status:   Future    Number of Occurrences:   1    Standing Expiration Date:   01/26/2024   Technologist smear review    Order Specific Question:   Clinical information:    Answer:   anemia   Follow up in 3-4 weeks to go over results.  All questions were answered. The patient knows to call the clinic with any problems, questions or concerns.  Rickard Patience, MD, PhD [facility] Hematology Oncology 01/26/2023     HISTORY OF PRESENTING ILLNESS:  Angela Fernandez is a  77 y.o.  female with PMH listed below who was referred to me for anemia Reviewed patient's recent labs that was done.  She has chronic macrocytic anemia since March 2024, Hb progressively decreased to 10.5 on 01/12/2023 10/13/22 iron panel showed ferritin 870, iron saturation 28.  She denies recent chest pain on exertion, shortness of breath on minimal exertion, pre-syncopal episodes, or palpitations She had not noticed any recent bleeding such as epistaxis, hematuria or  hematochezia.   She is on Plaquenil for calcium pyrophosphate deposition disease, she follows up with rheumatology  History of recurrent bladder infection, currently on Trimethoprim long term for prophylaxis.     MEDICAL HISTORY:  Past Medical History:  Diagnosis Date   Anemia    Arthritis    Bipolar affect, depressed (HCC)    COPD (chronic obstructive pulmonary disease) (HCC)    Diabetes mellitus without complication (HCC)    type II   GERD (gastroesophageal reflux disease)    Hyperlipidemia    Hypertension    Hypothyroidism    PTSD (post-traumatic stress disorder)    Schizoaffective disorder (HCC)    Sleep apnea    cpap   Stroke (HCC)    hx of  mini stroke - 2001   TIA (transient ischemic attack) 2010   UTI (urinary tract infection)     SURGICAL HISTORY: Past Surgical History:  Procedure Laterality  Date   BILATERAL CARPAL TUNNEL RELEASE Bilateral    BREAST BIOPSY     BREAST SURGERY Right    lumpectomy   CATARACT EXTRACTION W/ INTRAOCULAR LENS  IMPLANT, BILATERAL Bilateral    CHOLECYSTECTOMY     COLONOSCOPY     ESOPHAGOGASTRODUODENOSCOPY     EYE SURGERY     JOINT REPLACEMENT     NASAL SINUS SURGERY     REPLACEMENT TOTAL KNEE BILATERAL Bilateral    REVERSE SHOULDER ARTHROPLASTY Right 09/02/2020   Procedure: REVERSE SHOULDER ARTHROPLASTY;  Surgeon: Christena Flake, MD;  Location: [facility] ORS;  Service: Orthopedics;  Laterality: Right;   SHOULDER SURGERY Right 2020   rotator cuff repair   TOTAL SHOULDER REVISION Right 10/23/2021   Procedure: REVERSE SHOULDER REVISION;  Surgeon: Yolonda Kida, MD;  Location: [facility] OR[facility];  Service: Orthopedics;  Laterality: Right;  180    SOCIAL HISTORY: Social History   Socioeconomic History   Marital status: Single    Spouse name: Not on file   Number of children: 1   Years of education: Not on file   Highest education level: Some college, no degree  Occupational History   Not on file  Tobacco Use   Smoking status: Former    Current packs/day: 0.00    Types: Cigarettes    Start date: 34    Quit date: 2021    Years since quitting: 3.7   Smokeless tobacco: Never  Vaping Use   Vaping status: Never Used  Substance and Sexual Activity   Alcohol use: Not Currently    Comment: quit in age 70's or 49's   Drug use: Yes    Comment: last used in age 64's   Sexual activity: Not Currently  Other Topics Concern   Not on file  Social History Narrative   Moved from [place]; lives with daughter   Social Determinants of Health   Financial Resource Strain: Low Risk  (10/20/2022)   Received from [facility], [facility]   Overall Financial Resource Strain (CARDIA)    Difficulty of Paying Living Expenses: Not hard at all  Food Insecurity: No Food Insecurity (10/20/2022)   Received from [facility], [facility]   Hunger Vital Sign    Worried About Running Out of Food in the Last Year: Never true    Ran Out of Food in the Last Year: Never true  Transportation Needs: No Transportation Needs (10/20/2022)   Received from [facility], [facility]   [facility] - Transportation  In the past 12 months, has lack of transportation kept you from medical appointments or from getting medications?: No    Lack of Transportation (Non-Medical): No  Physical Activity: Not on file  Stress: Not on file  Social Connections: Not on file  Intimate Partner Violence: Not At Risk (01/26/2023)   Humiliation, Afraid, Rape, and Kick questionnaire    Fear of Current or Ex-Partner: No    Emotionally Abused: No    Physically Abused: No    Sexually Abused: No    FAMILY HISTORY: Family History  Problem Relation Age of Onset   Alzheimer's disease Mother    Alcohol abuse Father    Schizophrenia Father    Pancreatitis Father    Alcohol abuse Maternal Grandfather    Alzheimer's disease Maternal Grandmother     ALLERGIES:  has No Known Allergies.  MEDICATIONS:  Current Outpatient Medications  Medication Sig Dispense Refill   albuterol (VENTOLIN HFA) 108 (90 Base) MCG/ACT inhaler Inhale 2 puffs into the lungs every 6 (six) hours as needed for wheezing or shortness of breath. 8 g 2   Aloe-Sodium Chloride (AYR SALINE NASAL GEL NA) Place 1 application. into the nose every other day.     buPROPion (WELLBUTRIN SR) 200 MG 12 hr tablet Take 1 tablet (200 mg total) by mouth 2 (two) times daily. 60 tablet 5   clonazePAM (KLONOPIN) 1 MG tablet Take 1 tablet (1 mg total) by mouth 2 (two) times daily as needed for anxiety. 60 tablet 0   desvenlafaxine (PRISTIQ) 50 MG 24 hr tablet Take 50 mg by mouth daily.     diclofenac Sodium (VOLTAREN) 1 % GEL Apply topically.     docusate sodium (COLACE) 100 MG capsule Take 200 mg by mouth daily. 2 tabs in the am      Dulaglutide (TRULICITY) 0.75 MG/0.5ML SOPN Inject 0.75 mg into the skin once a week. 3 mL 2   fenofibrate (TRICOR) 145 MG tablet Take 1 tablet (145 mg total) by mouth daily. 4pm 90 tablet 1   ferrous sulfate 325 (65 FE) MG tablet Take 1 tablet (325 mg total) by mouth daily with breakfast. 90 tablet 0   hydroxychloroquine (PLAQUENIL) 200 MG tablet Take 200 mg by mouth 2 (two) times daily.     insulin glargine (LANTUS SOLOSTAR) 100 UNIT/ML Solostar Pen Inject 24 Units into the skin daily.     levothyroxine (SYNTHROID) 175 MCG tablet Take 1 tablet (175 mcg total) by mouth daily before breakfast. 90 tablet 1   lisinopril (ZESTRIL) 5 MG tablet Take 1 tablet (5 mg total) by mouth daily. 90 tablet 1   Lurasidone HCl (LATUDA) 120 MG TABS Take 1 tablet (120 mg total) by mouth daily. 30 tablet 5   meloxicam (MOBIC) 7.5 MG tablet Take 1 tablet (7.5 mg total) by mouth daily. 90 tablet 0   metoprolol succinate (TOPROL-XL) 100 MG 24 hr tablet Take 1 tablet (100 mg total) by mouth daily. Take with or immediately following a meal. 90 tablet 1   Multiple Vitamins-Minerals (HAIR/SKIN/NAILS) TABS Take 1 tablet by mouth daily.     Multiple Vitamins-Minerals (PRESERVISION AREDS 2) CAPS Take 1 capsule by mouth 2 (two) times daily.     omeprazole (PRILOSEC) 40 MG capsule Take 1 capsule (40 mg total) by mouth daily. 90 capsule 1   OVER THE COUNTER MEDICATION Take 1 Scoop by mouth daily in the afternoon. Collagen protein powder     pregabalin (LYRICA) 25 MG capsule Take 25 mg by mouth  2 (two) times daily.     simvastatin (ZOCOR) 20 MG tablet Take 1 tablet (20 mg total) by mouth daily. 90 tablet 1   tiZANidine (ZANAFLEX) 4 MG tablet Take 2-4 mg by mouth every 8 (eight) hours as needed for muscle spasms.     TRELEGY ELLIPTA 100-62.5-25 MCG/ACT AEPB INHALE 1 PUFF ONCE DAILY     trimethoprim (TRIMPEX) 100 MG tablet Take 1 tablet (100 mg total) by mouth daily. 90 tablet 3   umeclidinium-vilanterol (ANORO ELLIPTA) 62.5-25  MCG/ACT AEPB Inhale 2 puffs into the lungs daily as needed (shortness of breath).     Vibegron (GEMTESA) 75 MG TABS Take 1 tablet (75 mg total) by mouth daily. 90 tablet 3   VITAMIN E, TOPICAL, CREA Apply 1 application. topically daily.     zolpidem (AMBIEN) 5 MG tablet Take 1 tablet (5 mg total) by mouth at bedtime as needed for sleep. 30 tablet 1   cyclobenzaprine (FLEXERIL) 10 MG tablet Take 10 mg by mouth 3 (three) times daily as needed. (Patient not taking: Reported on 12/07/2022)     No current facility-administered medications for this visit.    Review of Systems  Constitutional:  Negative for appetite change, chills, fatigue and fever.  HENT:   Negative for hearing loss and voice change.   Eyes:  Negative for eye problems.  Respiratory:  Negative for chest tightness and cough.   Cardiovascular:  Negative for chest pain.  Gastrointestinal:  Negative for abdominal distention, abdominal pain and blood in stool.  Endocrine: Negative for hot flashes.  Genitourinary:  Negative for difficulty urinating and frequency.   Musculoskeletal:  Positive for arthralgias.  Skin:  Negative for itching and rash.  Neurological:  Negative for extremity weakness.  Hematological:  Negative for adenopathy.  Psychiatric/Behavioral:  Negative for confusion.     PHYSICAL EXAMINATION: Vitals:   01/26/23 0936  BP: 132/77  Pulse: 74  Resp: 18  Temp: (!) 97 F (36.1 C)  SpO2: 98%   Filed Weights   01/26/23 0936  Weight: 198 lb 6.4 oz (90 kg)    Physical Exam Constitutional:      General: She is not in acute distress. HENT:     Head: Normocephalic and atraumatic.  Eyes:     General: No scleral icterus. Cardiovascular:     Rate and Rhythm: Normal rate and regular rhythm.     Heart sounds: Normal heart sounds.  Pulmonary:     Effort: Pulmonary effort is normal. No respiratory distress.     Breath sounds: No wheezing.  Abdominal:     General: Bowel sounds are normal. There is no distension.      Palpations: Abdomen is soft.  Musculoskeletal:        General: No deformity. Normal range of motion.     Cervical back: Normal range of motion and neck supple.  Skin:    General: Skin is warm and dry.     Findings: No erythema or rash.  Neurological:     Mental Status: She is alert and oriented to person, place, and time. Mental status is at baseline.     Cranial Nerves: No cranial nerve deficit.     Coordination: Coordination normal.  Psychiatric:        Mood and Affect: Mood normal.      LABORATORY DATA:  I have reviewed the data as listed    Latest Ref Rng & Units 01/26/2023   10:14 AM 10/25/2021    2:52 AM 10/24/2021  3:14 AM  CBC  WBC 4.0 - 10.5 K/uL 9.3     Hemoglobin 12.0 - 15.0 g/dL 13.0  86.5  9.7   Hematocrit 36.0 - 46.0 % 38.1  30.3  30.8   Platelets 150 - 400 K/uL 345         Latest Ref Rng & Units 01/26/2023   10:14 AM 10/24/2021    3:14 AM 10/12/2021   10:27 AM  CMP  Glucose 70 - 99 mg/dL 94  784  98   BUN 8 - 23 mg/dL 24  23  26    Creatinine 0.44 - 1.00 mg/dL 6.96  2.95  2.84   Sodium 135 - 145 mmol/L 136  138  141   Potassium 3.5 - 5.1 mmol/L 4.1  3.6  4.3   Chloride 98 - 111 mmol/L 102  107  107   CO2 22 - 32 mmol/L 26  26  26    Calcium 8.9 - 10.3 mg/dL 9.8  8.7  9.8   Total Protein 6.5 - 8.1 g/dL 7.8     Total Bilirubin 0.3 - 1.2 mg/dL 0.6     Alkaline Phos 38 - 126 U/L 19     AST 15 - 41 U/L 36     ALT 0 - 44 U/L 27      Lab Results  Component Value Date   IRON 84 01/26/2023   TIBC 381 01/26/2023   IRONPCTSAT 22 01/26/2023   FERRITIN 420 (H) 01/26/2023     RADIOGRAPHIC STUDIES: I have personally reviewed the radiological images as listed and agreed with the findings in the report. No results found.

## 2023-01-27 ENCOUNTER — Telehealth: Payer: Self-pay

## 2023-01-27 ENCOUNTER — Encounter: Payer: Self-pay | Admitting: Psychiatry

## 2023-01-27 ENCOUNTER — Ambulatory Visit (INDEPENDENT_AMBULATORY_CARE_PROVIDER_SITE_OTHER): Payer: 59 | Admitting: Psychiatry

## 2023-01-27 VITALS — BP 119/66 | HR 78 | Temp 96.7°F | Ht 65.0 in | Wt 201.4 lb

## 2023-01-27 DIAGNOSIS — R4189 Other symptoms and signs involving cognitive functions and awareness: Secondary | ICD-10-CM

## 2023-01-27 DIAGNOSIS — R197 Diarrhea, unspecified: Secondary | ICD-10-CM

## 2023-01-27 DIAGNOSIS — F411 Generalized anxiety disorder: Secondary | ICD-10-CM | POA: Diagnosis not present

## 2023-01-27 DIAGNOSIS — F259 Schizoaffective disorder, unspecified: Secondary | ICD-10-CM | POA: Diagnosis not present

## 2023-01-27 DIAGNOSIS — K8681 Exocrine pancreatic insufficiency: Secondary | ICD-10-CM

## 2023-01-27 DIAGNOSIS — F4310 Post-traumatic stress disorder, unspecified: Secondary | ICD-10-CM

## 2023-01-27 DIAGNOSIS — G4700 Insomnia, unspecified: Secondary | ICD-10-CM

## 2023-01-27 DIAGNOSIS — Z79899 Other long term (current) drug therapy: Secondary | ICD-10-CM

## 2023-01-27 LAB — KAPPA/LAMBDA LIGHT CHAINS
Kappa free light chain: 31.5 mg/L — ABNORMAL HIGH (ref 3.3–19.4)
Kappa, lambda light chain ratio: 1.55 (ref 0.26–1.65)
Lambda free light chains: 20.3 mg/L (ref 5.7–26.3)

## 2023-01-27 LAB — HAPTOGLOBIN: Haptoglobin: 65 mg/dL (ref 42–346)

## 2023-01-27 MED ORDER — CLONAZEPAM 1 MG PO TABS: 1.0000 mg | ORAL_TABLET | Freq: Two times a day (BID) | ORAL | 0 refills | Status: DC | PRN

## 2023-01-27 MED ORDER — BUPROPION HCL ER (SR) 200 MG PO TB12: 200.0000 mg | ORAL_TABLET | Freq: Two times a day (BID) | ORAL | 5 refills | Status: DC

## 2023-01-27 MED ORDER — PANCRELIPASE (LIP-PROT-AMYL) 36000-114000 UNITS PO CPEP: ORAL_CAPSULE | ORAL | 1 refills | Status: AC

## 2023-01-27 MED ORDER — LURASIDONE HCL 120 MG PO TABS: 120.0000 mg | ORAL_TABLET | Freq: Every day | ORAL | 5 refills | Status: DC

## 2023-01-27 MED ORDER — ZOLPIDEM TARTRATE 5 MG PO TABS: 5.0000 mg | ORAL_TABLET | Freq: Every evening | ORAL | 1 refills | Status: DC | PRN

## 2023-01-27 NOTE — Patient Instructions (Signed)
Continue Pristiq 50 mg every day Continue bupropion 200 mg twice a day Continue Latuda 120 mg at night  Continue clonazepam 1 mg twice a day  Continue Ambien 5 mg at night as needed for insomnia Obtain EKG - please call [phone] or [phone] to make an appointment  Next appointment: 11/18 10:30

## 2023-01-27 NOTE — Telephone Encounter (Signed)
-----   Message from [facility] sent at 01/26/2023  5:13 PM EDT ----- Please inform patient that her pancreatic fecal elastase levels are mildly low which explains her symptoms of bloating and loose stools.  Recommend CT pancreas protocol and start Creon or Zenpep 1 capsule with first bite of each meal and 1 with snack DX: Mild exocrine pancreatic insufficiency  RV

## 2023-01-27 NOTE — Telephone Encounter (Signed)
Patient verbalized understanding of results. She states the CT scan sounds good because her dad died of pancreatic cancer. She will take the medication as prescribed. She can go for the CT scan any day but Tuesdays and Fridays, Called and got patient scheduled for 02/04/2023 [facility] at 11:15 for 11:30 scan. She said there was no dietary restriction. Called patient and patient verbalized understanding she states she will be there

## 2023-01-30 LAB — MULTIPLE MYELOMA PANEL, SERUM
Albumin SerPl Elph-Mcnc: 4.4 g/dL (ref 2.9–4.4)
Albumin/Glob SerPl: 1.5 (ref 0.7–1.7)
Alpha 1: 0.3 g/dL (ref 0.0–0.4)
Alpha2 Glob SerPl Elph-Mcnc: 0.6 g/dL (ref 0.4–1.0)
B-Globulin SerPl Elph-Mcnc: 1.1 g/dL (ref 0.7–1.3)
Gamma Glob SerPl Elph-Mcnc: 1 g/dL (ref 0.4–1.8)
Globulin, Total: 3 g/dL (ref 2.2–3.9)
IgA: 159 mg/dL (ref 64–422)
IgG (Immunoglobin G), Serum: 922 mg/dL (ref 586–1602)
IgM (Immunoglobulin M), Srm: 98 mg/dL (ref 26–217)
Total Protein ELP: 7.4 g/dL (ref 6.0–8.5)

## 2023-02-04 ENCOUNTER — Ambulatory Visit
Admission: RE | Admit: 2023-02-04 | Discharge: 2023-02-04 | Disposition: A | Discharge status: Home / Self Care | Payer: 59 | Source: Ambulatory Visit | Attending: Gastroenterology | Admitting: Gastroenterology

## 2023-02-04 DIAGNOSIS — R197 Diarrhea, unspecified: Secondary | ICD-10-CM | POA: Insufficient documentation

## 2023-02-04 DIAGNOSIS — K8681 Exocrine pancreatic insufficiency: Secondary | ICD-10-CM | POA: Insufficient documentation

## 2023-02-04 DIAGNOSIS — N281 Cyst of kidney, acquired: Secondary | ICD-10-CM | POA: Diagnosis not present

## 2023-02-04 MED ORDER — IOHEXOL 300 MG/ML  SOLN
100.0000 mL | Freq: Once | INTRAMUSCULAR | Status: AC | PRN
  Administered 2023-02-04: 100 mL via INTRAVENOUS

## 2023-02-05 DIAGNOSIS — E119 Type 2 diabetes mellitus without complications: Secondary | ICD-10-CM | POA: Diagnosis not present

## 2023-02-07 ENCOUNTER — Ambulatory Visit
Admission: RE | Admit: 2023-02-07 | Discharge: 2023-02-07 | Disposition: A | Discharge status: Home / Self Care | Payer: 59 | Source: Ambulatory Visit | Attending: Psychiatry | Admitting: Psychiatry

## 2023-02-07 DIAGNOSIS — Z5181 Encounter for therapeutic drug level monitoring: Secondary | ICD-10-CM | POA: Diagnosis not present

## 2023-02-07 DIAGNOSIS — Z79899 Other long term (current) drug therapy: Secondary | ICD-10-CM | POA: Insufficient documentation

## 2023-02-09 LAB — HEMOCHROMATOSIS DNA-PCR(C282Y,H63D)

## 2023-02-09 NOTE — Progress Notes (Signed)
Called patient as instructed by provider to inform of the EKG and to continue her medications as discussed at her last visit she voiced understanding.

## 2023-02-09 NOTE — Progress Notes (Signed)
Please contact the patient and inform her that the EKG results are good without any significant abnormalities. Advise her to continue taking the medication as discussed during her last visit.

## 2023-02-15 ENCOUNTER — Inpatient Hospital Stay: Payer: 59 | Admitting: Oncology

## 2023-02-15 NOTE — Assessment & Plan Note (Deleted)
Macrocytic anemia, Differentials include drug side effects, vitamin deficiency, hemolysis, underlying bone marrow disorders, etc.   check B12, folate, smear, iron tibc ferritin SPEP/light chain, haptoglobin, CMP, LDH.  Her medication may also contribute, ie Trimethoprim

## 2023-02-17 ENCOUNTER — Inpatient Hospital Stay: Payer: 59 | Attending: Oncology | Admitting: Oncology

## 2023-02-17 ENCOUNTER — Encounter: Payer: Self-pay | Admitting: Oncology

## 2023-02-17 VITALS — BP 144/68 | HR 65 | Temp 96.4°F | Resp 18 | Wt 202.8 lb

## 2023-02-17 DIAGNOSIS — Z148 Genetic carrier of other disease: Secondary | ICD-10-CM | POA: Diagnosis not present

## 2023-02-17 DIAGNOSIS — D649 Anemia, unspecified: Secondary | ICD-10-CM | POA: Diagnosis not present

## 2023-02-17 NOTE — Assessment & Plan Note (Addendum)
Labs are reviewed and discussed with patient. Anemia has resolved.  Anemia work up is negative.

## 2023-02-17 NOTE — Progress Notes (Signed)
Hematology/Oncology Consult note [phone] [phone]      Patient Care Team: Mick Sell, MD as PCP - General (Infectious Diseases) Rickard Patience, MD as Consulting Physician (Oncology)   CHIEF COMPLAINTS/REASON FOR VISIT:  Heterozygous hemachromatosis mutation, elevated ferritin.   ASSESSMENT & PLAN:  Anemia Labs are reviewed and discussed with patient. Anemia has resolved.  Anemia work up is negative.    Hemochromatosis carrier heterozygous Cys282Tyr Lab Results  Component Value Date   HGB 12.2 01/26/2023   TIBC 381 01/26/2023   IRONPCTSAT 22 01/26/2023   FERRITIN 420 (H) 01/26/2023    Diagnosis of heterozygous hereditary hemochromatosis discussed with patient.  Advised patient to have first-degree relative screening for hemochromatosis.  Avoid alcohol consumption, vitamin C supplementation.  Ferritin is < 500, no signs of organ damage.  No need for phlebotomy for now.    Orders Placed This Encounter  Procedures   CBC with Differential (Cancer Center Only)    Standing Status:   Future    Standing Expiration Date:   02/17/2024   CMP (Cancer Center only)    Standing Status:   Future    Standing Expiration Date:   02/17/2024   Iron and TIBC    Standing Status:   Future    Standing Expiration Date:   02/17/2024   Ferritin    Standing Status:   Future    Standing Expiration Date:   02/17/2024   Follow up in 6 months All questions were answered. The patient knows to call the clinic with any problems, questions or concerns.  Rickard Patience, MD, PhD [facility] Hematology Oncology 02/17/2023     HISTORY OF PRESENTING ILLNESS:  Angela Fernandez is a  77 y.o.  female with PMH listed below who was referred to me for anemia Reviewed patient's recent labs that was done.  She has chronic macrocytic anemia since March 2024, Hb progressively decreased to 10.5 on 01/12/2023 10/13/22 iron panel showed ferritin 870, iron saturation 28.  She denies recent  chest pain on exertion, shortness of breath on minimal exertion, pre-syncopal episodes, or palpitations She had not noticed any recent bleeding such as epistaxis, hematuria or hematochezia.   She is on Plaquenil for calcium pyrophosphate deposition disease, she follows up with rheumatology  History of recurrent bladder infection, currently on Trimethoprim long term for prophylaxis.    INTERVAL HISTORY Angela Fernandez is a 77 y.o. female who has above history reviewed by me today presents for follow up visit to review blood work up results.  She take oral iron supplementation.  Recent CT abdomen pelvis for bloating work up. Negative.  She was diagnosed with pancreatic insufficiency, was recommended by GI to take Creon.   MEDICAL HISTORY:  Past Medical History:  Diagnosis Date   Anemia    Arthritis    Bipolar affect, depressed (HCC)    COPD (chronic obstructive pulmonary disease) (HCC)    Diabetes mellitus without complication (HCC)    type II   GERD (gastroesophageal reflux disease)    Hyperlipidemia    Hypertension    Hypothyroidism    PTSD (post-traumatic stress disorder)    Schizoaffective disorder (HCC)    Sleep apnea    cpap   Stroke (HCC)    hx of  mini stroke - 2001   TIA (transient ischemic attack) 2010   UTI (urinary tract infection)     SURGICAL HISTORY: Past Surgical History:  Procedure Laterality Date   BILATERAL CARPAL TUNNEL RELEASE Bilateral    BREAST  BIOPSY     BREAST SURGERY Right    lumpectomy   CATARACT EXTRACTION W/ INTRAOCULAR LENS  IMPLANT, BILATERAL Bilateral    CHOLECYSTECTOMY     COLONOSCOPY     ESOPHAGOGASTRODUODENOSCOPY     EYE SURGERY     JOINT REPLACEMENT     NASAL SINUS SURGERY     REPLACEMENT TOTAL KNEE BILATERAL Bilateral    REVERSE SHOULDER ARTHROPLASTY Right 09/02/2020   Procedure: REVERSE SHOULDER ARTHROPLASTY;  Surgeon: Christena Flake, MD;  Location: [facility] ORS;  Service: Orthopedics;  Laterality: Right;   SHOULDER SURGERY Right 2020    rotator cuff repair   TOTAL SHOULDER REVISION Right 10/23/2021   Procedure: REVERSE SHOULDER REVISION;  Surgeon: Yolonda Kida, MD;  Location: [facility];  Service: Orthopedics;  Laterality: Right;  180    SOCIAL HISTORY: Social History   Socioeconomic History   Marital status: Single    Spouse name: Not on file   Number of children: 1   Years of education: Not on file   Highest education level: Some college, no degree  Occupational History   Not on file  Tobacco Use   Smoking status: Former    Current packs/day: 0.00    Types: Cigarettes    Start date: 63    Quit date: 2021    Years since quitting: 3.7   Smokeless tobacco: Never  Vaping Use   Vaping status: Never Used  Substance and Sexual Activity   Alcohol use: Not Currently    Comment: quit in age 21's or 62's   Drug use: Yes    Comment: last used in age 66's   Sexual activity: Not Currently  Other Topics Concern   Not on file  Social History Narrative   Moved from [place]; lives with daughter   Social Determinants of Health   Financial Resource Strain: Low Risk  (10/20/2022)   Received from [facility], [facility]   Overall Financial Resource Strain (CARDIA)    Difficulty of Paying Living Expenses: Not hard at all  Food Insecurity: No Food Insecurity (10/20/2022)   Received [facility], [facility]   Hunger Vital Sign    Worried About Running Out of Food in the Last Year: Never true    Ran Out of Food in the Last Year: Never true  Transportation Needs: No Transportation Needs (10/20/2022)   Received from [facility], [facility]   PRAPARE - Transportation    In the past 12 months, has lack of transportation kept you from medical appointments or from getting medications?: No    Lack of Transportation (Non-Medical): No  Physical Activity: Not on file  Stress: Not on file  Social Connections: Not  on file  Intimate Partner Violence: Not At Risk (01/26/2023)   Humiliation, Afraid, Rape, and Kick questionnaire    Fear of Current or Ex-Partner: No    Emotionally Abused: No    Physically Abused: No    Sexually Abused: No    FAMILY HISTORY: Family History  Problem Relation Age of Onset   Alzheimer's disease Mother    Alcohol abuse Father    Schizophrenia Father    Pancreatitis Father    Alcohol abuse Maternal Grandfather    Alzheimer's disease Maternal Grandmother     ALLERGIES:  has No Known Allergies.  MEDICATIONS:  Current Outpatient Medications  Medication Sig Dispense Refill   albuterol (VENTOLIN HFA) 108 (90 Base) MCG/ACT inhaler Inhale  2 puffs into the lungs every 6 (six) hours as needed for wheezing or shortness of breath. 8 g 2   Aloe-Sodium Chloride (AYR SALINE NASAL GEL NA) Place 1 application. into the nose every other day.     buPROPion (WELLBUTRIN SR) 200 MG 12 hr tablet Take 1 tablet (200 mg total) by mouth 2 (two) times daily. 60 tablet 5   clonazePAM (KLONOPIN) 1 MG tablet Take 1 tablet (1 mg total) by mouth 2 (two) times daily as needed for anxiety. 60 tablet 0   cyclobenzaprine (FLEXERIL) 10 MG tablet Take 10 mg by mouth 3 (three) times daily as needed.     desvenlafaxine (PRISTIQ) 50 MG 24 hr tablet Take 50 mg by mouth daily.     diclofenac Sodium (VOLTAREN) 1 % GEL Apply topically.     docusate sodium (COLACE) 100 MG capsule Take 200 mg by mouth daily. 2 tabs in the am     Dulaglutide (TRULICITY) 0.75 MG/0.5ML SOPN Inject 0.75 mg into the skin once a week. 3 mL 2   fenofibrate (TRICOR) 145 MG tablet Take 1 tablet (145 mg total) by mouth daily. 4pm 90 tablet 1   ferrous sulfate 325 (65 FE) MG tablet Take 1 tablet (325 mg total) by mouth daily with breakfast. 90 tablet 0   hydroxychloroquine (PLAQUENIL) 200 MG tablet Take 200 mg by mouth 2 (two) times daily.     insulin glargine (LANTUS SOLOSTAR) 100 UNIT/ML Solostar Pen Inject 24 Units into the skin daily.      levothyroxine (SYNTHROID) 175 MCG tablet Take 1 tablet (175 mcg total) by mouth daily before breakfast. 90 tablet 1   lipase/protease/amylase (CREON) 36000 UNITS CPEP capsule Take 1 capsules with the first bite of each meal and 1 capsule with the first bite of each snack 150 capsule 1   lisinopril (ZESTRIL) 5 MG tablet Take 1 tablet (5 mg total) by mouth daily. 90 tablet 1   Lurasidone HCl (LATUDA) 120 MG TABS Take 1 tablet (120 mg total) by mouth daily. 30 tablet 5   meloxicam (MOBIC) 7.5 MG tablet Take 1 tablet (7.5 mg total) by mouth daily. 90 tablet 0   metoprolol succinate (TOPROL-XL) 100 MG 24 hr tablet Take 1 tablet (100 mg total) by mouth daily. Take with or immediately following a meal. 90 tablet 1   Multiple Vitamins-Minerals (HAIR/SKIN/NAILS) TABS Take 1 tablet by mouth daily.     Multiple Vitamins-Minerals (PRESERVISION AREDS 2) CAPS Take 1 capsule by mouth 2 (two) times daily.     omeprazole (PRILOSEC) 40 MG capsule Take 1 capsule (40 mg total) by mouth daily. 90 capsule 1   OVER THE COUNTER MEDICATION Take 1 Scoop by mouth daily in the afternoon. Collagen protein powder     pregabalin (LYRICA) 25 MG capsule Take 25 mg by mouth 2 (two) times daily.     simvastatin (ZOCOR) 20 MG tablet Take 1 tablet (20 mg total) by mouth daily. 90 tablet 1   tiZANidine (ZANAFLEX) 4 MG tablet Take 2-4 mg by mouth every 8 (eight) hours as needed for muscle spasms.     TRELEGY ELLIPTA 100-62.5-25 MCG/ACT AEPB INHALE 1 PUFF ONCE DAILY     trimethoprim (TRIMPEX) 100 MG tablet Take 1 tablet (100 mg total) by mouth daily. 90 tablet 3   umeclidinium-vilanterol (ANORO ELLIPTA) 62.5-25 MCG/ACT AEPB Inhale 2 puffs into the lungs daily as needed (shortness of breath).     Vibegron (GEMTESA) 75 MG TABS Take 1 tablet (75 mg total)  by mouth daily. 90 tablet 3   VITAMIN E, TOPICAL, CREA Apply 1 application. topically daily.     zolpidem (AMBIEN) 5 MG tablet Take 1 tablet (5 mg total) by mouth at bedtime as needed  for sleep. 30 tablet 1   No current facility-administered medications for this visit.    Review of Systems  Constitutional:  Negative for appetite change, chills, fatigue and fever.  HENT:   Negative for hearing loss and voice change.   Eyes:  Negative for eye problems.  Respiratory:  Negative for chest tightness and cough.   Cardiovascular:  Negative for chest pain.  Gastrointestinal:  Negative for abdominal distention, abdominal pain and blood in stool.  Endocrine: Negative for hot flashes.  Genitourinary:  Negative for difficulty urinating and frequency.   Musculoskeletal:  Positive for arthralgias.  Skin:  Negative for itching and rash.  Neurological:  Negative for extremity weakness.  Hematological:  Negative for adenopathy.  Psychiatric/Behavioral:  Negative for confusion.     PHYSICAL EXAMINATION: Vitals:   02/17/23 1418  BP: (!) 144/68  Pulse: 65  Resp: 18  Temp: (!) 96.4 F (35.8 C)  SpO2: 98%   Filed Weights   02/17/23 1418  Weight: 202 lb 12.8 oz (92 kg)    Physical Exam Constitutional:      General: She is not in acute distress. HENT:     Head: Normocephalic and atraumatic.  Eyes:     General: No scleral icterus. Cardiovascular:     Rate and Rhythm: Normal rate and regular rhythm.     Heart sounds: Normal heart sounds.  Pulmonary:     Effort: Pulmonary effort is normal. No respiratory distress.     Breath sounds: No wheezing.  Abdominal:     General: Bowel sounds are normal. There is no distension.     Palpations: Abdomen is soft.  Musculoskeletal:        General: No deformity. Normal range of motion.     Cervical back: Normal range of motion and neck supple.  Skin:    General: Skin is warm and dry.     Findings: No erythema or rash.  Neurological:     Mental Status: She is alert and oriented to person, place, and time. Mental status is at baseline.     Cranial Nerves: No cranial nerve deficit.     Coordination: Coordination normal.   Psychiatric:        Mood and Affect: Mood normal.      LABORATORY DATA:  I have reviewed the data as listed    Latest Ref Rng & Units 01/26/2023   10:14 AM 10/25/2021    2:52 AM 10/24/2021    3:14 AM  CBC  WBC 4.0 - 10.5 K/uL 9.3     Hemoglobin 12.0 - 15.0 g/dL 78.2  95.6  9.7   Hematocrit 36.0 - 46.0 % 38.1  30.3  30.8   Platelets 150 - 400 K/uL 345         Latest Ref Rng & Units 01/26/2023   10:14 AM 10/24/2021    3:14 AM 10/12/2021   10:27 AM  CMP  Glucose 70 - 99 mg/dL 94  213  98   BUN 8 - 23 mg/dL 24  23  26    Creatinine 0.44 - 1.00 mg/dL 0.86  5.78  4.69   Sodium 135 - 145 mmol/L 136  138  141   Potassium 3.5 - 5.1 mmol/L 4.1  3.6  4.3   Chloride 98 -  111 mmol/L 102  107  107   CO2 22 - 32 mmol/L 26  26  26    Calcium 8.9 - 10.3 mg/dL 9.8  8.7  9.8   Total Protein 6.5 - 8.1 g/dL 7.8     Total Bilirubin 0.3 - 1.2 mg/dL 0.6     Alkaline Phos 38 - 126 U/L 19     AST 15 - 41 U/L 36     ALT 0 - 44 U/L 27      Lab Results  Component Value Date   IRON 84 01/26/2023   TIBC 381 01/26/2023   IRONPCTSAT 22 01/26/2023   FERRITIN 420 (H) 01/26/2023     RADIOGRAPHIC STUDIES: I have personally reviewed the radiological images as listed and agreed with the findings in the report. CT ABDOMEN PELVIS W CONTRAST  Result Date: 02/16/2023 CLINICAL DATA:  Diarrhea, mild exocrine pancreatic insufficiency EXAM: CT ABDOMEN AND PELVIS WITH CONTRAST TECHNIQUE: Multidetector CT imaging of the abdomen and pelvis was performed using the standard protocol following bolus administration of intravenous contrast. RADIATION DOSE REDUCTION: This exam was performed according to the departmental dose-optimization program which includes automated exposure control, adjustment of the mA and/or kV according to patient size and/or use of iterative reconstruction technique. CONTRAST:  OMNIPAQUE IOHEXOL 300 MG/ML  SOLN COMPARISON:  None Available. FINDINGS: Lower chest: Eventration of the hemidiaphragm  with associated right basilar atelectasis. Calcified granuloma in the right middle lobe, benign. Hepatobiliary: Liver is within normal limits. Status post cholecystectomy. No intrahepatic or extrahepatic ductal dilatation. Pancreas: Within normal limits. Spleen: Within normal limits. Adrenals/Urinary Tract: Adrenal glands are within normal limits. Right kidney is within normal limits. 7 mm cyst in the lateral left lower kidney (series 7, image 36), benign (Bosniak I). No follow-up is recommended. Bladder is within normal limits. Stomach/Bowel: Stomach is within normal limits. No evidence of bowel obstruction. Normal appendix (series 2/image 63). No colonic wall thickening or inflammatory changes. Vascular/Lymphatic: No evidence of abdominal aortic aneurysm. Atherosclerotic calcifications of the abdominal aorta and branch vessels, although vessels remain patent. No suspicious abdominopelvic lymphadenopathy. Reproductive: Uterus is within normal limits. Bilateral ovaries within normal limits. Other: No abdominopelvic ascites. Musculoskeletal: Degenerative changes of the visualized thoracolumbar spine. IMPRESSION: Status post cholecystectomy. Otherwise negative CT abdomen/pelvis. Electronically Signed   By: Charline Bills M.D.   On: 02/16/2023 01:48

## 2023-02-17 NOTE — Assessment & Plan Note (Addendum)
heterozygous Cys282Tyr Lab Results  Component Value Date   HGB 12.2 01/26/2023   TIBC 381 01/26/2023   IRONPCTSAT 22 01/26/2023   FERRITIN 420 (H) 01/26/2023    Diagnosis of heterozygous hereditary hemochromatosis discussed with patient.  Advised patient to have first-degree relative screening for hemochromatosis.  Avoid alcohol consumption, vitamin C supplementation.  Ferritin is < 500, no signs of organ damage.  No need for phlebotomy for now.

## 2023-02-24 ENCOUNTER — Ambulatory Visit (INDEPENDENT_AMBULATORY_CARE_PROVIDER_SITE_OTHER): Payer: 59 | Admitting: Licensed Clinical Social Worker

## 2023-02-24 DIAGNOSIS — F4310 Post-traumatic stress disorder, unspecified: Secondary | ICD-10-CM | POA: Diagnosis not present

## 2023-02-24 DIAGNOSIS — F411 Generalized anxiety disorder: Secondary | ICD-10-CM | POA: Diagnosis not present

## 2023-02-24 DIAGNOSIS — F331 Major depressive disorder, recurrent, moderate: Secondary | ICD-10-CM | POA: Diagnosis not present
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
## 2023-03-02 ENCOUNTER — Other Ambulatory Visit: Payer: Self-pay

## 2023-03-03 ENCOUNTER — Other Ambulatory Visit: Payer: Self-pay | Admitting: Psychiatry

## 2023-03-03 NOTE — Telephone Encounter (Signed)
Patient called to request a refill for the following medication please advise  desvenlafaxine (PRISTIQ) 50 MG 24 hr tablet   Last visit 01/27/23 Next visit 03/29/23  Preferred pharmacy  [facility] [address], [place] - [address]

## 2023-03-04 ENCOUNTER — Other Ambulatory Visit: Payer: Self-pay | Admitting: Psychiatry

## 2023-03-04 MED ORDER — DESVENLAFAXINE SUCCINATE ER 50 MG PO TB24: 50.0000 mg | ORAL_TABLET | Freq: Every day | ORAL | 1 refills | Status: DC

## 2023-03-04 NOTE — Telephone Encounter (Signed)
Ordered

## 2023-03-04 NOTE — Telephone Encounter (Signed)
received fax requesting a refill on the pristiq . pt last seen on 9-19 next appt 11-19

## 2023-03-07 ENCOUNTER — Encounter: Payer: Self-pay | Admitting: Gastroenterology

## 2023-03-07 ENCOUNTER — Ambulatory Visit (INDEPENDENT_AMBULATORY_CARE_PROVIDER_SITE_OTHER): Payer: 59 | Admitting: Gastroenterology

## 2023-03-07 ENCOUNTER — Telehealth: Payer: Self-pay | Admitting: *Deleted

## 2023-03-07 ENCOUNTER — Other Ambulatory Visit: Payer: Self-pay

## 2023-03-07 VITALS — BP 123/78 | HR 72 | Temp 97.9°F | Ht 65.0 in | Wt 199.0 lb

## 2023-03-07 DIAGNOSIS — K5900 Constipation, unspecified: Secondary | ICD-10-CM

## 2023-03-07 DIAGNOSIS — E119 Type 2 diabetes mellitus without complications: Secondary | ICD-10-CM | POA: Diagnosis not present

## 2023-03-07 DIAGNOSIS — Z1211 Encounter for screening for malignant neoplasm of colon: Secondary | ICD-10-CM

## 2023-03-07 DIAGNOSIS — K5909 Other constipation: Secondary | ICD-10-CM

## 2023-03-07 MED ORDER — GOLYTELY 236 G PO SOLR: 4000.0000 mL | Freq: Once | ORAL | 0 refills | Status: AC

## 2023-03-07 MED ORDER — CLENPIQ 10-3.5-12 MG-GM -GM/175ML PO SOLN
175.0000 mL | Freq: Once | ORAL | 0 refills | Status: AC
Start: 2023-03-07 — End: 2023-03-07

## 2023-03-07 NOTE — Telephone Encounter (Signed)
Patient called asking if she can take her over the counter vitamins, Preservation and Hair and nail vitamins. If no, she is asking what she can take in place of them. Please advise

## 2023-03-07 NOTE — Progress Notes (Signed)
Arlyss Repress, MD [address]  [address]  [place], [place] [address]  Main: [phone]  Fax: [phone]    Gastroenterology Consultation  Referring Provider:     Mick Sell, MD Primary Care Physician:  Mick Sell, MD Primary Gastroenterologist:  Dr. Arlyss Repress Reason for Consultation: Loose stools, abdominal bloating, macrocytic anemia        HPI:   Novah Gregston is a 77 y.o. female referred by Dr. Mick Sell, MD  for consultation & management of chronic symptoms of abdominal bloating, loose stools and macrocytic anemia.  Patient has history of diabetes, previously on Trulicity.  Trulicity has been discontinued because of upper GI symptoms and currently her upper GI symptoms have resolved.  And, currently her upper GI symptoms have resolved.  She reports having feeling gassy, bloated and passing flatus.  She also reports 2-3 loose stools, without any rectal bleeding.  Denies any weight loss or loss of appetite.  She does consume artificial sweeteners daily as well as loves eating cheese.  Has mild macrocytic anemia.  Most recent hemoglobin 11.7, MCV 101.4.  HbA1c is 5.6 in 3/24.  Follow-up visit 03/07/2023 Ms. Ala Dach is here to discuss about constipation.  She reports abdominal bloating and constipation and has to take Ex-Lax to move her bowels.  She thinks her constipation has worsened since taking pancreatic enzymes.  She is found to have mild exocrine pancreatic insufficiency during workup when I saw her 4 months ago.  She also underwent CT abdomen and pelvis which was unremarkable.  She also states that almost all her life she suffered from constipation and occasionally has intermittent loose stools.  NSAIDs: None  Antiplts/Anticoagulants/Anti thrombotics: None  GI Procedures: EGD and colonoscopy in 2021 at [facility] in [place] EGD revealed irregular Z-line only and colonoscopy revealed subcentimeter polyp that was resected,  pathology confirmed hyperplastic polyp   Past Medical History:  Diagnosis Date   Anemia    Arthritis    Bipolar affect, depressed (HCC)    COPD (chronic obstructive pulmonary disease) (HCC)    Diabetes mellitus without complication (HCC)    type II   GERD (gastroesophageal reflux disease)    Hyperlipidemia    Hypertension    Hypothyroidism    PTSD (post-traumatic stress disorder)    Schizoaffective disorder (HCC)    Sleep apnea    cpap   Stroke (HCC)    hx of  mini stroke - 2001   TIA (transient ischemic attack) 2010   UTI (urinary tract infection)     Past Surgical History:  Procedure Laterality Date   BILATERAL CARPAL TUNNEL RELEASE Bilateral    BREAST BIOPSY     BREAST SURGERY Right    lumpectomy   CATARACT EXTRACTION W/ INTRAOCULAR LENS  IMPLANT, BILATERAL Bilateral    CHOLECYSTECTOMY     COLONOSCOPY     ESOPHAGOGASTRODUODENOSCOPY     EYE SURGERY     JOINT REPLACEMENT     NASAL SINUS SURGERY     REPLACEMENT TOTAL KNEE BILATERAL Bilateral    REVERSE SHOULDER ARTHROPLASTY Right 09/02/2020   Procedure: REVERSE SHOULDER ARTHROPLASTY;  Surgeon: Christena Flake, MD;  Location: [facility] ORS;  Service: Orthopedics;  Laterality: Right;   SHOULDER SURGERY Right 2020   rotator cuff repair   TOTAL SHOULDER REVISION Right 10/23/2021   Procedure: REVERSE SHOULDER REVISION;  Surgeon: Yolonda Kida, MD;  Location: [facility];  Service: Orthopedics;  Laterality: Right;  180  Current Outpatient Medications:    albuterol (VENTOLIN HFA) 108 (90 Base) MCG/ACT inhaler, Inhale 2 puffs into the lungs every 6 (six) hours as needed for wheezing or shortness of breath., Disp: 8 g, Rfl: 2   Aloe-Sodium Chloride (AYR SALINE NASAL GEL NA), Place 1 application. into the nose every other day., Disp: , Rfl:    buPROPion (WELLBUTRIN SR) 200 MG 12 hr tablet, Take 1 tablet (200 mg total) by mouth 2 (two) times daily., Disp: 60 tablet, Rfl: 5   [START ON 03/13/2023] clonazePAM (KLONOPIN) 1 MG  tablet, Take 1 tablet (1 mg total) by mouth 2 (two) times daily as needed. for anxiety, Disp: 60 tablet, Rfl: 0   desvenlafaxine (PRISTIQ) 50 MG 24 hr tablet, Take 1 tablet (50 mg total) by mouth daily., Disp: 90 tablet, Rfl: 1   diclofenac Sodium (VOLTAREN) 1 % GEL, Apply topically., Disp: , Rfl:    docusate sodium (COLACE) 100 MG capsule, Take 200 mg by mouth daily. 2 tabs in the am, Disp: , Rfl:    Dulaglutide (TRULICITY) 0.75 MG/0.5ML SOPN, Inject 0.75 mg into the skin once a week., Disp: 3 mL, Rfl: 2   fenofibrate (TRICOR) 145 MG tablet, Take 1 tablet (145 mg total) by mouth daily. 4pm, Disp: 90 tablet, Rfl: 1   hydroxychloroquine (PLAQUENIL) 200 MG tablet, Take 200 mg by mouth 2 (two) times daily., Disp: , Rfl:    insulin glargine (LANTUS SOLOSTAR) 100 UNIT/ML Solostar Pen, Inject 24 Units into the skin daily., Disp: , Rfl:    levothyroxine (SYNTHROID) 175 MCG tablet, Take 1 tablet (175 mcg total) by mouth daily before breakfast., Disp: 90 tablet, Rfl: 1   lipase/protease/amylase (CREON) 36000 UNITS CPEP capsule, Take 1 capsules with the first bite of each meal and 1 capsule with the first bite of each snack, Disp: 150 capsule, Rfl: 1   lisinopril (ZESTRIL) 5 MG tablet, Take 1 tablet (5 mg total) by mouth daily., Disp: 90 tablet, Rfl: 1   Lurasidone HCl (LATUDA) 120 MG TABS, Take 1 tablet (120 mg total) by mouth daily., Disp: 30 tablet, Rfl: 5   meloxicam (MOBIC) 7.5 MG tablet, Take 1 tablet (7.5 mg total) by mouth daily., Disp: 90 tablet, Rfl: 0   metoprolol succinate (TOPROL-XL) 100 MG 24 hr tablet, Take 1 tablet (100 mg total) by mouth daily. Take with or immediately following a meal., Disp: 90 tablet, Rfl: 1   omeprazole (PRILOSEC) 40 MG capsule, Take 1 capsule (40 mg total) by mouth daily., Disp: 90 capsule, Rfl: 1   polyethylene glycol (GOLYTELY) 236 g solution, Take 4,000 mLs by mouth once for 1 dose., Disp: 4000 mL, Rfl: 0   pregabalin (LYRICA) 25 MG capsule, Take 25 mg by mouth 2 (two)  times daily., Disp: , Rfl:    simvastatin (ZOCOR) 20 MG tablet, Take 1 tablet (20 mg total) by mouth daily., Disp: 90 tablet, Rfl: 1   Sod Picosulfate-Mag Ox-Cit Acd (CLENPIQ) 10-3.5-12 MG-GM -GM/175ML SOLN, Take 175 mLs by mouth once for 1 dose., Disp: 350 mL, Rfl: 0   tiZANidine (ZANAFLEX) 4 MG tablet, Take 2-4 mg by mouth every 8 (eight) hours as needed for muscle spasms., Disp: , Rfl:    TRELEGY ELLIPTA 100-62.5-25 MCG/ACT AEPB, INHALE 1 PUFF ONCE DAILY, Disp: , Rfl:    trimethoprim (TRIMPEX) 100 MG tablet, Take 1 tablet (100 mg total) by mouth daily., Disp: 90 tablet, Rfl: 3   Vibegron (GEMTESA) 75 MG TABS, Take 1 tablet (75 mg total) by mouth  daily., Disp: 90 tablet, Rfl: 3   zolpidem (AMBIEN) 5 MG tablet, Take 1 tablet (5 mg total) by mouth at bedtime as needed for sleep., Disp: 30 tablet, Rfl: 1   cyclobenzaprine (FLEXERIL) 10 MG tablet, Take 10 mg by mouth 3 (three) times daily as needed. (Patient not taking: Reported on 03/07/2023), Disp: , Rfl:    lidocaine-prilocaine (EMLA) cream, Apply 1 Application topically. (Patient not taking: Reported on 03/07/2023), Disp: , Rfl:    Multiple Vitamins-Minerals (HAIR/SKIN/NAILS) TABS, Take 1 tablet by mouth daily. (Patient not taking: Reported on 03/07/2023), Disp: , Rfl:    Multiple Vitamins-Minerals (PRESERVISION AREDS 2) CAPS, Take 1 capsule by mouth 2 (two) times daily. (Patient not taking: Reported on 03/07/2023), Disp: , Rfl:    OVER THE COUNTER MEDICATION, Take 1 Scoop by mouth daily in the afternoon. Collagen protein powder (Patient not taking: Reported on 03/07/2023), Disp: , Rfl:    umeclidinium-vilanterol (ANORO ELLIPTA) 62.5-25 MCG/ACT AEPB, Inhale 2 puffs into the lungs daily as needed (shortness of breath). (Patient not taking: Reported on 03/07/2023), Disp: , Rfl:    VITAMIN E, TOPICAL, CREA, Apply 1 application. topically daily. (Patient not taking: Reported on 03/07/2023), Disp: , Rfl:    Family History  Problem Relation Age of  Onset   Alzheimer's disease Mother    Alcohol abuse Father    Schizophrenia Father    Pancreatitis Father    Alcohol abuse Maternal Grandfather    Alzheimer's disease Maternal Grandmother      Social History   Tobacco Use   Smoking status: Former    Current packs/day: 0.00    Types: Cigarettes    Start date: 1967    Quit date: 2021    Years since quitting: 3.8   Smokeless tobacco: Never  Vaping Use   Vaping status: Never Used  Substance Use Topics   Alcohol use: Not Currently    Comment: quit in age 68's or 62's   Drug use: Yes    Comment: last used in age 31's    Allergies as of 03/07/2023   (No Known Allergies)    Review of Systems:    All systems reviewed and negative except where noted in HPI.   Physical Exam:  BP 123/78 (BP Location: Right Arm, Patient Position: Sitting, Cuff Size: Normal)   Pulse 72   Temp 97.9 F (36.6 C) (Oral)   Ht 5\' 5"  (1.651 m)   Wt 199 lb (90.3 kg)   BMI 33.12 kg/m  No LMP recorded. Patient is postmenopausal.  General:   Alert,  Well-developed, well-nourished, pleasant and cooperative in NAD Head:  Normocephalic and atraumatic. Eyes:  Sclera clear, no icterus.   Conjunctiva pink. Ears:  Normal auditory acuity. Nose:  No deformity, discharge, or lesions. Mouth:  No deformity or lesions,oropharynx pink & moist. Neck:  Supple; no masses or thyromegaly. Lungs:  Respirations even and unlabored.  Clear throughout to auscultation.   No wheezes, crackles, or rhonchi. No acute distress. Heart:  Regular rate and rhythm; no murmurs, clicks, rubs, or gallops. Abdomen:  Normal bowel sounds. Soft, non-tender and non-distended without masses, hepatosplenomegaly or hernias noted.  No guarding or rebound tenderness.   Rectal: Not performed Msk:  Symmetrical without gross deformities. Good, equal movement & strength bilaterally. Pulses:  Normal pulses noted. Extremities:  No clubbing or edema.  No cyanosis. Neurologic:  Alert and oriented x3;   grossly normal neurologically. Skin:  Intact without significant lesions or rashes. No jaundice. Psych:  Alert and cooperative.  Normal mood and affect.  Imaging Studies: No abdominal imaging  Assessment and Plan:   Mahia Wait is a 77 y.o. female with history of metabolic syndrome, history of chronic tobacco use, quit smoking in 2021, previous history of chronic symptoms of nonbloody loose stools associated with abdominal gas and bloating.  Workup revealed negative celiac disease panel, H. pylori stool antigen. Pancreatic fecal elastase levels were mildly low at 184.  Started on pancreatic enzymes  Currently suffering from constipation Advised patient to stop pancreatic enzymes Continue to take high-fiber diet, she is mostly vegetarian Advised to try MiraLAX daily  Colon cancer screening  Recommend screening colonoscopy  Follow up as needed   Arlyss Repress, MD

## 2023-03-08 NOTE — Telephone Encounter (Signed)
Pt informed and verbalized understanding

## 2023-03-08 NOTE — Telephone Encounter (Signed)
Call returned to pt, she states that her eye doctor wants her to take preservation but it does contain vitamin C, she just wants to make sure this is ok/ not ok. She would like to take hair skin nail vitamins but this also contains small amount of vit C. Please advise.

## 2023-03-09 ENCOUNTER — Telehealth: Payer: Self-pay

## 2023-03-09 NOTE — Telephone Encounter (Signed)
Patient called and states her daughter can not take off  to take her for the colonoscopy till the week of christmas. Informed her Dr. Allegra Lai was out of the office that week and the week after. She would not be back till January 6. She states she will talk to her daughter and call us back. Called endo and talk to Doran and cancel the colonoscopy

## 2023-03-09 NOTE — Telephone Encounter (Signed)
Pt per need to r/s procedure.

## 2023-03-16 ENCOUNTER — Ambulatory Visit (INDEPENDENT_AMBULATORY_CARE_PROVIDER_SITE_OTHER): Payer: 59 | Admitting: Licensed Clinical Social Worker

## 2023-03-16 DIAGNOSIS — F4310 Post-traumatic stress disorder, unspecified: Secondary | ICD-10-CM | POA: Diagnosis not present

## 2023-03-16 DIAGNOSIS — F259 Schizoaffective disorder, unspecified: Secondary | ICD-10-CM | POA: Diagnosis not present

## 2023-03-16 NOTE — Progress Notes (Signed)
THERAPIST PROGRESS NOTE  Session Time: 10:02am-10:48am  Participation Level: Active  Behavioral Response: NeatAlertDepressed  Type of Therapy: Individual Therapy  Treatment Goals addressed: STG: Trinaty will identify situations, thoughts, and feelings that trigger internal anger, and/or angry/aggressive actions as evidenced by self-report     ProgressTowards Goals: Progressing  Interventions: Assertiveness Training, Supportive, Anger Management Training, and Other: ACT  Summary: Angela Fernandez is a 77 y.o. female who presents with symptoms of depression.  Patient endorses symptoms of but not limited to tearfulness, irritability, negative self affect.  Patient is oriented x 5, engaged in therapeutic session, maintained appropriate eye contact.  Patient identifies passive suicidal ideations, denies intent and plan.  Patient can identify positive factors to live.  Patient utilized therapeutic session to process recent life stressors.  When discussing art therapy and engagement in enjoyable activities, patient identifies she feels limited due to previous injury to her arm.  However, patient reports she has been attending her swim/aerobics classes to work on strengthening her arm following history of injuries.  When reflecting on relationship with her daughter, patient states improvement citing success with setting boundaries.  Patient reflected on tendencies to "blow up", but acknowledges she often bottles up her feelings.  Reports this was learned in childhood as she was given messages that her voice did not matter.  Processed fears and concerns about recent election citing she is scared about the future.  Patient became tearful.  Shared improvements with relationship with granddaughter as she was invited to her Halloween party.  Patient reflected on the joy and hope that brought her as well as the social opportunities the party allowed her when communicating with her daughter and son in law.    When processing the anger iceberg, patient identified feeling shame due to history of alcoholism, childhood behaviors, overarching past identifying the negative cognition "I am a failure."  Identified disappointment behind her feelings of anger. Pt  identified ways she wished her life would have turned out differently reporting the negative cognition "I have lost everything."  Reflected on resentment towards her daughter following her move and caretaking tendencies.  Engaged in ACT by naming her negative internal dialogue-Cognitive defusion technique.  Processed ways she can challenge her negative cognitions.  Patient expressed gratitude and hope in learning techniques to reframe unhelpful thoughts.  Suicidal/Homicidal: Yeswithout intent/plan  Therapist Response: Clinician utilized active listening to create a safe and supportive environment for patient to process recent stressors and reflect on feelings related to her experiences.  Encouraged patient to practice self compassion and addressing misplaced shame and guilt.  Reinforced and praised patient's efforts to establish healthy boundaries with her support system.  Courage patient to practice positive self talk.  Provided psychoeducation about the anger iceberg and cognitive defusion strategies.  Supported patient and building self-esteem and reframing negative self talk.  Supported patient in processing emotions related to the election.  Plan: Return again in 2 weeks.  Diagnosis: Schizoaffective disorder, unspecified type (HCC)  PTSD (post-traumatic stress disorder)   Collaboration of Care: Psychiatrist AEB   psychiatrist can access notes and cln. Will review psychiatrists' notes. Check in with the patient and will see LCSW per availability. Patient agreed with treatment recommendations. Pt. is scheduled for a follow-up in 2 weeks.     Patient/Guardian was advised Release of Information must be obtained prior to any record release in order  to collaborate their care with an outside provider. Patient/Guardian was advised if they have not already done so to contact the  registration department to sign all necessary forms in order for [place] to release information regarding their care.   Consent: Patient/Guardian gives verbal consent for treatment and assignment of benefits for services provided during this visit. Patient/Guardian expressed understanding and agreed to proceed.   Dereck Leep, LCSW 03/16/2023

## 2023-03-17 ENCOUNTER — Ambulatory Visit: Payer: 59 | Admitting: Physical Therapy

## 2023-03-17 NOTE — Therapy (Deleted)
OUTPATIENT PHYSICAL THERAPY EVALUATION   Patient Name: Angela Fernandez MRN: [id number] DOB:Mar 03, 1946, 77 y.o., female Today's Date: 03/17/2023  END OF SESSION:   Past Medical History:  Diagnosis Date   Anemia    Arthritis    Bipolar affect, depressed (HCC)    COPD (chronic obstructive pulmonary disease) (HCC)    Diabetes mellitus without complication (HCC)    type II   GERD (gastroesophageal reflux disease)    Hyperlipidemia    Hypertension    Hypothyroidism    PTSD (post-traumatic stress disorder)    Schizoaffective disorder (HCC)    Sleep apnea    cpap   Stroke (HCC)    hx of  mini stroke - 2001   TIA (transient ischemic attack) 2010   UTI (urinary tract infection)    Past Surgical History:  Procedure Laterality Date   BILATERAL CARPAL TUNNEL RELEASE Bilateral    BREAST BIOPSY     BREAST SURGERY Right    lumpectomy   CATARACT EXTRACTION W/ INTRAOCULAR LENS  IMPLANT, BILATERAL Bilateral    CHOLECYSTECTOMY     COLONOSCOPY     ESOPHAGOGASTRODUODENOSCOPY     EYE SURGERY     JOINT REPLACEMENT     NASAL SINUS SURGERY     REPLACEMENT TOTAL KNEE BILATERAL Bilateral    REVERSE SHOULDER ARTHROPLASTY Right 09/02/2020   Procedure: REVERSE SHOULDER ARTHROPLASTY;  Surgeon: Christena Flake, MD;  Location: [facility];  Service: Orthopedics;  Laterality: Right;   SHOULDER SURGERY Right 2020   rotator cuff repair   TOTAL SHOULDER REVISION Right 10/23/2021   Procedure: REVERSE SHOULDER REVISION;  Surgeon: Yolonda Kida, MD;  Location: [facility];  Service: Orthopedics;  Laterality: Right;  180   Patient Active Problem List   Diagnosis Date Noted   Anemia 01/26/2023   Hemochromatosis carrier 01/26/2023   Hx of sleep apnea 12/21/2021   S/P reverse total shoulder arthroplasty, right 10/23/2021   Cystocele with rectocele 04/10/2021   Mixed urinary incontinence due to female genital prolapse 04/10/2021   Submucous leiomyoma of uterus 04/10/2021   Thickened endometrium 04/10/2021    Pelvic pressure in female 02/11/2021   DJD (degenerative joint disease) of cervical spine 12/28/2020   Anxiety, generalized 12/28/2020   History of depression 12/28/2020   Memory difficulty 12/28/2020   Chronic venous insufficiency 12/24/2020   Lymphedema 12/24/2020   Diabetes (HCC) 12/24/2020   Hyperlipidemia 12/24/2020   Essential hypertension 12/24/2020   Closed fracture of neck of right scapula 10/17/2020   Status post reverse total shoulder replacement, right 09/02/2020   Rotator cuff arthropathy, right 07/14/2020    PCP: Mick Sell, MD  REFERRING PROVIDER: Elijah Birk, MD  REFERRING DIAG: cervical radiculopathy, cervicalgia  THERAPY DIAG:  No diagnosis found.  Rationale for Evaluation and Treatment: Rehabilitation  ONSET DATE: ***  SUBJECTIVE:  SUBJECTIVE STATEMENT: *** Hand dominance: {MISC; OT HAND DOMINANCE:[phone]}   From Dr. Mariah Milling note 01/12/2023: "She is status post left C5-6 TFESI 12/28/2022 with 75% improvement."  PERTINENT HISTORY:  Patient is a 77 y.o. female who presents to outpatient physical therapy with a referral for medical diagnosis cervical radiculopathy, cervicalgia. This patient's chief complaints consist of ***, leading to the following functional deficits: ***. Relevant past medical history and comorbidities include has Status post reverse total shoulder replacement, right; Chronic venous insufficiency; Lymphedema; Diabetes (HCC); Hyperlipidemia; Essential hypertension; Closed fracture of neck of right scapula; Rotator cuff arthropathy, right; DJD (degenerative joint disease) of cervical spine; Pelvic pressure in female; Cystocele with rectocele; Mixed urinary incontinence due to female genital prolapse; Submucous leiomyoma of uterus;  Thickened endometrium; Anxiety, generalized; History of depression; Memory difficulty; S/P reverse total shoulder arthroplasty, right; Hx of sleep apnea; Anemia; and Hemochromatosis carrier on their problem list.  has a past medical history of Anemia, Arthritis, Bipolar affect, depressed (HCC), COPD (chronic obstructive pulmonary disease) (HCC), Diabetes mellitus without complication (HCC), GERD (gastroesophageal reflux disease), Hyperlipidemia, Hypertension, Hypothyroidism, PTSD (post-traumatic stress disorder), Schizoaffective disorder (HCC), Sleep apnea, Stroke (HCC), TIA (transient ischemic attack) (2010), and UTI (urinary tract infection).  has a past surgical history that includes Shoulder surgery (Right, 2020); Cholecystectomy; Breast surgery (Right); Joint replacement; Replacement total knee bilateral (Bilateral); Eye surgery; Cataract extraction w/ intraocular lens  implant, bilateral (Bilateral); Bilateral carpal tunnel release (Bilateral); Colonoscopy; Esophagogastroduodenoscopy; Nasal sinus surgery; Reverse shoulder arthroplasty (Right, 09/02/2020); Breast biopsy; and Total shoulder revision (Right, 10/23/2021).  Patient denies hx of {redflags:27294}    PAIN: Are you having pain? Yes NPRS: Current: ***/10,  Best: ***/10, Worst: ***/10. Pain location: *** Pain description: *** Aggravating factors: *** Relieving factors: ***   FUNCTIONAL LIMITATIONS: ***  LEISURE: ***  PRECAUTIONS: {Therapy precautions:24002}   WEIGHT BEARING RESTRICTIONS: {Yes ***/No:24003}  FALLS:  Has patient fallen in last 6 months? {fallsyesno:27318}  LIVING ENVIRONMENT: Lives with: {[facility] lives with:25569::"lives with their family"} Lives in: {Lives in:25570} Stairs: {opstairs:27293} Has following equipment at home: {Assistive devices:23999}  OCCUPATION: ***  PLOF: {PLOF:24004}  PATIENT GOALS: ***  NEXT MD VISIT: ***  OBJECTIVE  DIAGNOSTIC FINDINGS:  Cervical spine MRI report from 03/23/2021:   CLINICAL DATA:  Chronic neck pain radiating to the left shoulder.   EXAM: MRI CERVICAL SPINE WITHOUT CONTRAST   TECHNIQUE: Multiplanar, multisequence MR imaging of the cervical spine was performed. No intravenous contrast was administered.   COMPARISON:  None.   FINDINGS: Alignment: 2 mm anterolisthesis at C3-C4 and C4-C5.   Vertebrae: No fracture, evidence of discitis, or bone lesion. Left-sided degenerative endplate marrow edema at C3-C4.   Cord: Normal signal and morphology.   Posterior Fossa, vertebral arteries, paraspinal tissues: Negative.   Disc levels:   C2-C3: Mild disc bulging and bilateral uncovertebral hypertrophy. Severe left and moderate right facet arthropathy. Moderate left greater than right neuroforaminal stenosis. No spinal canal stenosis.   C3-C4: Moderate disc bulging with superimposed small central disc protrusion. Severe bilateral facet arthropathy with perifacet marrow edema on the right. Mild spinal canal stenosis. Severe bilateral neuroforaminal stenosis.   C4-C5: Mild disc bulging moderate right and mild left facet arthropathy. Mild right neuroforaminal stenosis. No spinal canal or left neuroforaminal stenosis.   C5-C6: Small posterior disc osteophyte complex and mild bilateral uncovertebral hypertrophy. Mild right greater than left neuroforaminal stenosis. No spinal canal stenosis.   C6-C7: Mild disc bulging and bilateral uncovertebral hypertrophy. No stenosis.   C7-T1: Negative disc. Moderate right facet  arthropathy. No stenosis.   IMPRESSION: 1. Multilevel degenerative changes of the cervical spine as described above, worst at C3-C4 where there is severe bilateral neuroforaminal stenosis. 2. Acute degenerative inflammatory changes of the right C3-C4 facet joint, which can be a source of pain.   SELF- REPORTED FUNCTION FOTO score: ***/100 (neck questionnaire)  OBSERVATION/INSPECTION Posture Posture (seated): forward head,  rounded shoulders, slumped in sitting.  Posture (standing): *** Posture correction: *** Anthropometrics Tremor: none Body composition: *** Muscle bulk: *** Skin: The incision sites appear to be healing well with no excessive redness, warmth, drainage or signs of infection present.  *** Edema: *** Functional Mobility Bed mobility: *** Transfers: *** Gait: grossly WFL for household and short community ambulation. More detailed gait analysis deferred to later date as needed. *** Stairs: ***  SPINE MOTION  LUMBAR SPINE AROM *Indicates pain Flexion: *** Extension: *** Side Flexion:   R ***  L *** Rotation:  R *** L *** Side glide:  R *** L *** Protraction: *** Retraction: ***   NEUROLOGICAL  Upper Motor Neuron Screen Babinski, Hoffman's and Clonus (ankle) negative bilaterally.  Dermatomes C2-T1 appears equal and intact to light touch except the following: *** L2-S2 appears equal and intact to light touch except the following: *** Deep Tendon Reflexes R/L  ***+/***+ Biceps brachii reflex (C5, C6) ***+/***+ Brachioradialis reflex (C6) ***+/***+ Triceps brachii reflex (C7) ***+/***+ Quadriceps reflex (L4) ***+/***+ Achilles reflex (S1)  SPINE MOTION  CERVICAL SPINE AROM *Indicates pain Flexion: *** Extension: *** Side Flexion:   R ***  L *** Rotation:  R *** L ***   PERIPHERAL JOINT MOTION (in degrees)  ACTIVE RANGE OF MOTION (AROM) *Indicates pain Date Date Date  Joint/Motion R/L R/L R/L  Shoulder     Flexion / / /  Extension / / /  Abduction  / / /  External rotation / / /  Internal rotation / / /  Elbow     Flexion  / / /  Extension  / / /  Wrist     Flexion / / /  Extension  / / /  Radial deviation / / /  Ulnar deviation / / /  Pronation / / /  Supination / / /  Hip     Flexion / / /  Extension  / / /  Abduction / / /  Adduction / / /  External rotation / / /  Internal rotation  / / /  Knee     Extension / / /  Flexoin / / /   Ankle/Foot     Dorsiflexion (knee ext) / / /  Dorsiflexion (knee flex) / / /  Plantarflexion / / /  Everison / / /  Inversion / / /  Great toe extension / / /  Great toe flexion / / /  Comments:   PASSIVE RANGE OF MOTION (PROM) *Indicates pain Date Date Date  Joint/Motion R/L R/L R/L  Shoulder     Flexion / / /  Extension / / /  Abduction  / / /  External rotation / / /  Internal rotation / / /  Elbow     Flexion  / / /  Extension  / / /  Wrist     Flexion / / /  Extension  / / /  Radial deviation / / /  Ulnar deviation / / /  Pronation / / /  Supination / / /  Hip  Flexion  / / /  Extension  / / /  Abduction / / /  Adduction / / /  External rotation / / /  Internal rotation  / / /  Knee     Extension / / /  Flexion / / /  Ankle/Foot     Dorsiflexion (knee ext) / / /  Dorsiflexion (knee flex) / / /  Plantarflexion / / /  Everison / / /  Inversion / / /  Great toe extension / / /  Great toe flexion / / /  Comments:   MUSCLE PERFORMANCE (MMT):  *Indicates pain Date Date Date  Joint/Motion R/L R/L R/L  Shoulder     Flexion / / /  Abduction (C5) / / /  External rotation / / /  Internal rotation / / /  Extension / / /  Elbow     Flexion (C6) / / /  Extension (C7) / / /  Wrist     Flexion (C7) / / /  Extension (C6) / / /  Radial deviation / / /  Ulnar deviation (C8) / / /  Pronation / / /  Supination / / /  Hand     Thumb extension (C8) / / /  Finger abduction (T1) / / /  Grip (C8) / / /  Hip     Flexion (L1, L2) / / /  Extension (knee ext) / / /  Extension (knee flex) / / /  Abduction / / /  Adduction / / /  External rotation / / /  Internal rotation  / / /  Knee     Extension (L3) / / /  Flexion (S2) / / /  Ankle/Foot     Dorsiflexion (L4) / / /  Great toe extension (L5) / / /  Eversion (S1) / / /  Plantarflexion (S1) / / /  Inversion / / /  Pronation / / /  Great toe flexion / / /  Comments:   SPECIAL  TESTS:  .Neurodynamictests .NeurodynamicUE .NeurodynamicLE .CspineInstability .CSPINESPECIALTESTS .SHOULDERSPECIALTESTCLUSTERS .HIPSPECIALTESTS .SIJSPECIALTESTS   SHOULDER SPECIAL TESTS RTC, Impingement, Anterior Instability (macrotrauma), Labral Tear: Painful arc test: R = ***, L = ***. Drop arm test: R = ***, L = ***. Hawkins-Kennedy test: R = ***, L = ***. Infraspinatus test: R = ***, L = ***. Apprehension test: R = ***, L = ***. Relocation test: R = ***, L = ***. Active compression test: R = ***, L = ***.  ACCESSORY MOTION: ***  PALPATION: ***  SUSTAINED POSITIONS TESTING:  ***  REPEATED MOTIONS TESTING: ***  FUNCTIONAL/BALANCE TESTS: Five Time Sit to Stand (5TSTS): *** seconds Functional Gait Assessment (FGA): ***/30 (see details above) Ten meter walking trial ( ): *** m/s Six Minute Walk Test ( ): *** feet Timed Up and Go (TUG): *** seconds   Dynamic Gait Index: ***/24 BERG Balance Scale: ***/56 Tinetti/POMA: ***/28 Timed Up and GO: *** seconds (average of 3 trials) Trial 1: *** Trial 2: *** Trial 3: *** Romberg test: -Narrow stance, eyes open: *** seconds -Narrow stance, eyes closed: *** seconds Sharpened Romberg test: -Tandem stance, eyes open: *** seconds -Tandem stance, eyes closed: *** seconds  Narrow stance, firm surface, eyes open: *** seconds Narrow stance, firm surface, eyes closed: *** seconds Narrow stance, compliant surface, eyes open: *** seconds Narrow stance, compliant surface, eyes closed: *** seconds Single leg stance, firm surface, eyes open: R= *** seconds, L= *** seconds Single leg stance, compliant surface,  eyes open: R= *** seconds, L= *** seconds Gait speed: *** m/s Functional reach test: *** inches     TODAY'S TREATMENT:    PATIENT EDUCATION:  Education details: *** Person educated: {Person educated:25204} Education method: {Education Method:25205} Education comprehension: {Education  Comprehension:25206}  HOME EXERCISE PROGRAM: ***  ASSESSMENT:  CLINICAL IMPRESSION: Patient is a 77 y.o. female referred to outpatient physical therapy with a medical diagnosis of cervical radiculopathy, cervicalgia who presents with signs and symptoms consistent with ***. Patient presents with significant *** impairments that are limiting ability to complete *** without difficulty. Patient will benefit from skilled physical therapy intervention to address current body structure impairments and activity limitations to improve function and work towards goals set in current POC in order to return to prior level of function or maximal functional improvement.    OBJECTIVE IMPAIRMENTS: {opptimpairments:25111}.   ACTIVITY LIMITATIONS: {activitylimitations:27494}  PARTICIPATION LIMITATIONS: {participationrestrictions:25113}  PERSONAL FACTORS: {Personal factors:25162} are also affecting patient's functional outcome.   REHAB POTENTIAL: {rehabpotential:25112}  CLINICAL DECISION MAKING: {clinical decision making:25114}  EVALUATION COMPLEXITY: {Evaluation complexity:25115}   GOALS: Goals reviewed with patient? {yes/no:20286}  SHORT TERM GOALS: Target date: 03/31/2023  Patient will be independent with initial home exercise program for self-management of symptoms. Baseline: {HEPbaseline4:27310} (03/17/23); Goal status: INITIAL   LONG TERM GOALS: Target date: 06/09/2023  Patient will be independent with a long-term home exercise program for self-management of symptoms.  Baseline: {HEPbaseline4:27310} (03/17/23); Goal status: INITIAL  2.  Patient will demonstrate improved FOTO to equal or greater than *** by visit #*** to demonstrate improvement in overall condition and self-reported functional ability.  Baseline: *** (03/17/23); Goal status: INITIAL  3.  *** Baseline: *** (03/17/23); Goal status: INITIAL  4.  *** Baseline: *** (03/17/23); Goal status: INITIAL  5.  Patient will  demonstrate improvement in Patient Specific Functional Scale (PSFS) of equal or greater than 3 points to reflect clinically significant improvement in patient's most valued functional activities. Baseline: *** (03/17/23); Goal status: INITIAL  6.  Patient will complete community, work and/or recreational activities without limitation due to current condition.  Baseline: *** (03/17/23); Goal status: INITIAL    PLAN:  PT FREQUENCY: {rehab frequency:25116}  PT DURATION: {rehab duration:25117}  PLANNED INTERVENTIONS: {rehab planned interventions:25118::"97110-Therapeutic exercises","97530- Therapeutic [phone]- Neuromuscular re-education","97535- Self JXBJ","47829- Manual therapy"}  PLAN FOR NEXT SESSION: ***   Rebie Peale R. Ilsa Iha, PT, DPT 03/17/23, 8:22 PM  [facility] [address] [place], [place] [address] P: [phone] I F: [phone]

## 2023-03-21 ENCOUNTER — Other Ambulatory Visit: Payer: Self-pay | Admitting: Infectious Diseases

## 2023-03-21 DIAGNOSIS — Z1231 Encounter for screening mammogram for malignant neoplasm of breast: Secondary | ICD-10-CM

## 2023-03-24 ENCOUNTER — Ambulatory Visit: Payer: 59 | Attending: Physical Medicine & Rehabilitation | Admitting: Physical Therapy

## 2023-03-24 ENCOUNTER — Encounter: Payer: 59 | Admitting: Physical Therapy

## 2023-03-24 ENCOUNTER — Encounter: Payer: Self-pay | Admitting: Physical Therapy

## 2023-03-24 DIAGNOSIS — R293 Abnormal posture: Secondary | ICD-10-CM | POA: Diagnosis not present

## 2023-03-24 DIAGNOSIS — M6281 Muscle weakness (generalized): Secondary | ICD-10-CM | POA: Diagnosis not present

## 2023-03-24 DIAGNOSIS — M542 Cervicalgia: Secondary | ICD-10-CM | POA: Insufficient documentation

## 2023-03-24 NOTE — Therapy (Signed)
OUTPATIENT PHYSICAL THERAPY EVALUATION   Patient Name: Angela Fernandez MRN: [id number] DOB:10-08-1945, 77 y.o., female Today's Date: 03/25/2023  END OF SESSION:  PT End of Session - 03/25/23 1118     Visit Number 1    Number of Visits 13    Date for PT Re-Evaluation 06/16/23    Authorization Type [employer] MEDICARE reporting period from 03/24/2023    Progress Note Due on Visit 10    PT Start Time 1347    PT Stop Time 1427    PT Time Calculation (min) 40 min    Activity Tolerance Patient tolerated treatment well    Behavior During Therapy WFL for tasks assessed/performed             Past Medical History:  Diagnosis Date   Anemia    Arthritis    Bipolar affect, depressed (HCC)    COPD (chronic obstructive pulmonary disease) (HCC)    Diabetes mellitus without complication (HCC)    type II   GERD (gastroesophageal reflux disease)    Hyperlipidemia    Hypertension    Hypothyroidism    PTSD (post-traumatic stress disorder)    Schizoaffective disorder (HCC)    Sleep apnea    cpap   Stroke (HCC)    hx of  mini stroke - 2001   TIA (transient ischemic attack) 2010   UTI (urinary tract infection)    Past Surgical History:  Procedure Laterality Date   BILATERAL CARPAL TUNNEL RELEASE Bilateral    BREAST BIOPSY     BREAST SURGERY Right    lumpectomy   CATARACT EXTRACTION W/ INTRAOCULAR LENS  IMPLANT, BILATERAL Bilateral    CHOLECYSTECTOMY     COLONOSCOPY     ESOPHAGOGASTRODUODENOSCOPY     EYE SURGERY     JOINT REPLACEMENT     NASAL SINUS SURGERY     REPLACEMENT TOTAL KNEE BILATERAL Bilateral    REVERSE SHOULDER ARTHROPLASTY Right 09/02/2020   Procedure: REVERSE SHOULDER ARTHROPLASTY;  Surgeon: Christena Flake, MD;  Location: [facility];  Service: Orthopedics;  Laterality: Right;   SHOULDER SURGERY Right 2020   rotator cuff repair   TOTAL SHOULDER REVISION Right 10/23/2021   Procedure: REVERSE SHOULDER REVISION;  Surgeon: Yolonda Kida, MD;  Location: [facility];  Service: Orthopedics;  Laterality: Right;  180   Patient Active Problem List   Diagnosis Date Noted   Anemia 01/26/2023   Hemochromatosis carrier 01/26/2023   Hx of sleep apnea 12/21/2021   S/P reverse total shoulder arthroplasty, right 10/23/2021   Cystocele with rectocele 04/10/2021   Mixed urinary incontinence due to female genital prolapse 04/10/2021   Submucous leiomyoma of uterus 04/10/2021   Thickened endometrium 04/10/2021   Pelvic pressure in female 02/11/2021   DJD (degenerative joint disease) of cervical spine 12/28/2020   Anxiety, generalized 12/28/2020   History of depression 12/28/2020   Memory difficulty 12/28/2020   Chronic venous insufficiency 12/24/2020   Lymphedema 12/24/2020   Diabetes (HCC) 12/24/2020   Hyperlipidemia 12/24/2020   Essential hypertension 12/24/2020   Closed fracture of neck of right scapula 10/17/2020   Status post reverse total shoulder replacement, right 09/02/2020   Rotator cuff arthropathy, right 07/14/2020    PCP: Mick Sell, MD  REFERRING PROVIDER: Elijah Birk, MD  REFERRING DIAG: cervical radiculopathy, cervicalgia  THERAPY DIAG:  Cervicalgia  Abnormal posture  Muscle weakness (generalized)  Rationale for Evaluation and Treatment: Rehabilitation  ONSET DATE: 10/23/2021  SUBJECTIVE:  SUBJECTIVE STATEMENT: Patient states she is coming for neck pain and she cannot turn her head to well. She had shots for it, but she states it didn't help much. She states her neck pain really started when she had an operation on her right shoulder, but she remembers having neck pain at a kid and not being able to turn her head well. She had 3 right shoulder surgeries, the last was a reverse total shoulder revision on 10/23/2021.  She states her neck gradually started hurting more after the surgery. She states her neck pain has been getting worse. She states it hurts when she is trying to turn her neck. She swims 3 times a week and that aggravates it. She had an injection in her neck (chart review states "left C5-6 TFESI 12/28/2022) and she states she did not feel much improvement at all.   She states she gets dizzy sometimes when physically exerting herself (but not during aerobics). She has trouble feeling breathless when walking across the lawn. She states she has diabetes and COPD that may be related. She states she has not told her doctor about spells of dizziness. She states it happens quite often, about 10 times a month.   She states it is hard to turn her head, her neck feels stiff, and she can hear a lot of crunching in it.   Hand dominance: Right but uses left hand a lot due to the surgeries on her right shoulder.   PERTINENT HISTORY:  Patient is a 77 y.o. female who presents to outpatient physical therapy with a referral for medical diagnosis cervical radiculopathy, cervicalgia. This patient's chief complaints consist of neck pain and stiffness, leading to the following functional deficits: difficulty with water aerobics, turing to look at things, enjoying nature, sleeping. Relevant past medical history and comorbidities include has Status post reverse total shoulder replacement, right; Chronic venous insufficiency; Lymphedema; Diabetes (HCC); Hyperlipidemia; Essential hypertension; Closed fracture of neck of right scapula; Rotator cuff arthropathy, right; DJD (degenerative joint disease) of cervical spine; Pelvic pressure in female; Cystocele with rectocele; Mixed urinary incontinence due to female genital prolapse; Submucous leiomyoma of uterus; Thickened endometrium; Anxiety, generalized; History of depression; Memory difficulty; S/P reverse total shoulder arthroplasty, right; Hx of sleep apnea; Anemia; and  Hemochromatosis carrier on their problem list.  has a past medical history of Anemia, Arthritis, Bipolar affect, depressed (HCC), COPD (chronic obstructive pulmonary disease) (HCC), Diabetes mellitus without complication (HCC), GERD (gastroesophageal reflux disease), Hyperlipidemia, Hypertension, Hypothyroidism, PTSD (post-traumatic stress disorder), Schizoaffective disorder (HCC), Sleep apnea, Stroke (HCC), TIA (transient ischemic attack) (2010), and UTI (urinary tract infection).  has a past surgical history that includes Shoulder surgery (Right, 2020); Cholecystectomy; Breast surgery (Right); Joint replacement; Replacement total knee bilateral (Bilateral); Eye surgery; Cataract extraction w/ intraocular lens  implant, bilateral (Bilateral); Bilateral carpal tunnel release (Bilateral); Colonoscopy; Esophagogastroduodenoscopy; Nasal sinus surgery; Reverse shoulder arthroplasty (Right, 09/02/2020); Breast biopsy; and Total shoulder revision (Right, 10/23/2021).  Patient denies hx of cancer, seizures, heart problems, unexplained weight loss, unexplained changes in bowel or bladder problems, osteoporosis, and spinal surgery    PAIN: Are you having pain? Yes NPRS: Current: 0/10,  Best: 0/10, Worst: 7/10. Will last the whole day if it starts hurting.  Pain location: lower to mid neck with radiation to top of each shoulder, radiates to base of scull on right, also radiates below bilateral scapulae.  Pain description: sharp shooting, denies UE paresthesia.  Aggravating factors: when holding her head up (extension), holding neck up when  swimming, extending neck, turning head,  Relieving factors: stop doing the things that bother it   FUNCTIONAL LIMITATIONS: difficulty with water aerobics, turing to look at things, enjoying nature, sleeping  LEISURE: water aerobics (3x a week), [place] be [place] the [place],  around nature, painting  PRECAUTIONS: Fall, no lifting heavy things with right UE (maybe 5-6# unsure,  definitely not big bags like 30#)  WEIGHT BEARING RESTRICTIONS: No  FALLS:  Has patient fallen in last 6 months? Yes. Number of falls 2 (one she was sleeping and fell out of bed, other one got really dizzy outside when trying to chase the dog that got away and she tripped and fell  LIVING ENVIRONMENT: Lives with: granddaughter and her boyfriend (moved from [place] about 2.5 years ago, doesn't like it, weather is too hot).  Lives in: one store house Stairs: 1 step to get in door, no handrail Has following equipment at home: Single point cane and Walker - 2 wheeled. Used to use an AD but no longer.  OCCUPATION: retired substance abuse counselor  PLOF: Independent  PATIENT GOALS: "to be able to strengthening my upper arm and get my strength in that where I can hold it up for a while and get that pain under control"  NEXT MD VISIT: don't know  OBJECTIVE  DIAGNOSTIC FINDINGS:  Cervical spine MRI report from 03/23/2021:  CLINICAL DATA:  Chronic neck pain radiating to the left shoulder.   EXAM: MRI CERVICAL SPINE WITHOUT CONTRAST   TECHNIQUE: Multiplanar, multisequence MR imaging of the cervical spine was performed. No intravenous contrast was administered.   COMPARISON:  None.   FINDINGS: Alignment: 2 mm anterolisthesis at C3-C4 and C4-C5.   Vertebrae: No fracture, evidence of discitis, or bone lesion. Left-sided degenerative endplate marrow edema at C3-C4.   Cord: Normal signal and morphology.   Posterior Fossa, vertebral arteries, paraspinal tissues: Negative.   Disc levels:   C2-C3: Mild disc bulging and bilateral uncovertebral hypertrophy. Severe left and moderate right facet arthropathy. Moderate left greater than right neuroforaminal stenosis. No spinal canal stenosis.   C3-C4: Moderate disc bulging with superimposed small central disc protrusion. Severe bilateral facet arthropathy with perifacet marrow edema on the right. Mild spinal canal stenosis. Severe  bilateral neuroforaminal stenosis.   C4-C5: Mild disc bulging moderate right and mild left facet arthropathy. Mild right neuroforaminal stenosis. No spinal canal or left neuroforaminal stenosis.   C5-C6: Small posterior disc osteophyte complex and mild bilateral uncovertebral hypertrophy. Mild right greater than left neuroforaminal stenosis. No spinal canal stenosis.   C6-C7: Mild disc bulging and bilateral uncovertebral hypertrophy. No stenosis.   C7-T1: Negative disc. Moderate right facet arthropathy. No stenosis.   IMPRESSION: 1. Multilevel degenerative changes of the cervical spine as described above, worst at C3-C4 where there is severe bilateral neuroforaminal stenosis. 2. Acute degenerative inflammatory changes of the right C3-C4 facet joint, which can be a source of pain.   OBSERVATION/INSPECTION Posture Posture (seated): forward head, rounded shoulders, thoracic hyperkyphosis Heberden's nodes noted on hands Functional Mobility Gait: grossly WFL for household and short community ambulation. More detailed gait analysis deferred to later date as needed.   SPINE MOTION CERVICAL SPINE AROM *Indicates pain Flexion: 30 painful Extension: 25 painful Side Flexion:   R 14 ipsilateral pain  L 17 ipsilateral pain Rotation:  R 43 bilateral neck pain L 30 pulls at right shoulder and hurts at left base of neck Protraction: WFL Retrusion: 0.5 inches from protruded position, painful   PERIPHERAL JOINT  MOTION (in degrees) ACTIVE RANGE OF MOTION (AROM) *Indicates pain 03/24/23 Date Date  Joint/Motion R/L R/L R/L  Shoulder     Flexion 88/134* / /  Extension 48/60 / /  Abduction  82/155 / /  External rotation 35/77 / /  Internal rotation L5*/T7 / /  Comments:  02/21/2023: B elbows, wrists, hands grossly WFL  MUSCLE PERFORMANCE (MMT):  *Indicates pain 03/24/23 Date Date  Joint/Motion R/L R/L R/L  Shoulder     Flexion 3+/4+ / /  Abduction (C5) 4/5 / /  External  rotation 1/4 / /  Internal rotation 5/5 / /  Extension / / /  Elbow     Flexion (C6) 5/5 / /  Extension (C7) 5/5 / /  Hand     Thumb extension (C8) B WFL / /  Finger abduction (T1) B WFL / /  Comments:   SPECIAL TESTS: CERVICAL SPINE Cervical spine axial compression: left sided neck pain Cervical spine axial distraction: left sided neck pain   TODAY'S TREATMENT:   Therapeutic exercise: to centralize symptoms and improve ROM, strength, muscular endurance, and activity tolerance required for successful completion of functional activities.  - Education on diagnosis, prognosis, POC, anatomy and physiology of current condition.  - Education on HEP including handout     PATIENT EDUCATION:  Education details: Exercise purpose/form. Self management techniques. Education on diagnosis, prognosis, POC, anatomy and physiology of current condition. Education on HEP including handout. Person educated: Patient Education method: Explanation, Demonstration, and Handouts Education comprehension: verbalized understanding and needs further education  HOME EXERCISE PROGRAM: Access Code: [facility] URL: [email or web address] Date: 03/25/2023 Prepared by: Norton Blizzard  Exercises - Seated Cervical Rotation AROM  - 3 x daily - 2 sets - 10 reps - standing cervical flexion/extension AROM  - 3 x daily - 2 sets - 10 reps  ASSESSMENT:  CLINICAL IMPRESSION: Patient is a 77 y.o. female referred to outpatient physical therapy with a medical diagnosis of cervical radiculopathy, cervicalgia who presents with signs and symptoms consistent with chronic neck pain and stiffness. Patient demonstrates deficits in cervical spine ROM and posture that are likely contributing to her neck pain. She does not appear to have radiculopathy but does have right shoulder dysfunction with RTSA revision that likely contributes to her neck pain.Patient presents with significant pain, ROM, joint stiffness, muscle  performance (strength/power/endurance), balance, muscle tension, motor control, and activity tolerance impairments that are limiting ability to complete her usual activities including difficulty with water aerobics, turing to look at things, enjoying nature, sleeping without difficulty. Patient will benefit from skilled physical therapy intervention to address current body structure impairments and activity limitations to improve function and work towards goals set in current POC in order to return to prior level of function or maximal functional improvement.    OBJECTIVE IMPAIRMENTS: decreased activity tolerance, decreased balance, decreased coordination, decreased endurance, decreased knowledge of condition, decreased mobility, decreased ROM, decreased strength, hypomobility, impaired perceived functional ability, increased muscle spasms, impaired flexibility, impaired UE functional use, improper body mechanics, postural dysfunction, obesity, and pain.   ACTIVITY LIMITATIONS: sleeping and moving head, looking at things  PARTICIPATION LIMITATIONS: interpersonal relationship, community activity, and enjoying nature, participating in water aerobics  PERSONAL FACTORS: Age, Past/current experiences, Time since onset of injury/illness/exacerbation, and 3+ comorbidities:   Status post reverse total shoulder replacement, right; Chronic venous insufficiency; Lymphedema; Diabetes (HCC); Hyperlipidemia; Essential hypertension; Closed fracture of neck of right scapula; Rotator cuff arthropathy, right; DJD (degenerative joint disease) of cervical spine; Pelvic pressure  in female; Cystocele with rectocele; Mixed urinary incontinence due to female genital prolapse; Submucous leiomyoma of uterus; Thickened endometrium; Anxiety, generalized; History of depression; Memory difficulty; S/P reverse total shoulder arthroplasty, right; Hx of sleep apnea; Anemia; and Hemochromatosis carrier on their problem list.  has a past  medical history of Anemia, Arthritis, Bipolar affect, depressed (HCC), COPD (chronic obstructive pulmonary disease) (HCC), Diabetes mellitus without complication (HCC), GERD (gastroesophageal reflux disease), Hyperlipidemia, Hypertension, Hypothyroidism, PTSD (post-traumatic stress disorder), Schizoaffective disorder (HCC), Sleep apnea, Stroke (HCC), TIA (transient ischemic attack) (2010), and UTI (urinary tract infection).  has a past surgical history that includes Shoulder surgery (Right, 2020); Cholecystectomy; Breast surgery (Right); Joint replacement; Replacement total knee bilateral (Bilateral); Eye surgery; Cataract extraction w/ intraocular lens  implant, bilateral (Bilateral); Bilateral carpal tunnel release (Bilateral); Colonoscopy; Esophagogastroduodenoscopy; Nasal sinus surgery; Reverse shoulder arthroplasty (Right, 09/02/2020); Breast biopsy; and Total shoulder revision (Right, 10/23/2021) are also affecting patient's functional outcome.   REHAB POTENTIAL: Fair due to chronicity of problem, comorbid conditions  CLINICAL DECISION MAKING: Evolving/moderate complexity  EVALUATION COMPLEXITY: Moderate   GOALS: Goals reviewed with patient? No  SHORT TERM GOALS: Target date: 04/07/2023  Patient will be independent with initial home exercise program for self-management of symptoms. Baseline: Initial HEP provided at IE (03/24/23); Goal status: INITIAL   LONG TERM GOALS: Target date: 06/16/2023  Patient will be independent with a long-term home exercise program for self-management of symptoms.  Baseline: Initial HEP provided at IE (03/24/23); Goal status: INITIAL  2.  Patient will demonstrate improved FOTO by equal or greater than 10 points by visit #10 to demonstrate improvement in overall condition and self-reported functional ability.  Baseline: to be measured at visit 2 as appropriate - tech malfunction at IE (03/24/23); Goal status: INITIAL  3.  Patient will demonstrate the ability  to rotate cervical spine equal or greater than 60 degrees each direction to improve her ability to view her surroundings and enjoy nature.  Baseline: R/L 43/30 (03/24/23); Goal status: INITIAL  4.  Patient will score equal or greater than 39 seconds on the Deep Neck Flexor Endurance Test (DNFET) to match norms for pain free individuals in her age group (norm +1 standard deviation) and improve her ability to enjoy nature and participate in water aerobics.  Baseline: to be tested visit 2 as appropriate (03/24/23); Goal status: INITIAL  5.  Patient will demonstrate improvement in Patient Specific Functional Scale (PSFS) of equal or greater than 3 points to reflect clinically significant improvement in patient's most valued functional activities. Baseline: to be measured visit 2 as appropriate (03/24/23); Goal status: INITIAL  6.  Patient will report decrease in pain during functional activities to equal or less than 3/10 to improve her ability to sleep, view her surroundings, and participate in water aerobics.  Baseline: to be measured visit 2 as appropriate (03/24/23); Goal status: INITIAL    PLAN:  PT FREQUENCY: 1-2x/week  PT DURATION: 8-12 weeks  PLANNED INTERVENTIONS: 97164- PT Re-evaluation, 97110-Therapeutic exercises, 97530- Therapeutic activity, [id number]- Neuromuscular re-education, 97140- Manual therapy, [id number]- Aquatic Therapy, 97014- Electrical stimulation (unattended), [phone]- Electrical stimulation (manual), Patient/Family education, Balance training, Dry Needling, Joint mobilization, Spinal mobilization, Cryotherapy, and Moist heat  PLAN FOR NEXT SESSION: update HEP as appropriate, address joint restrictions and soft tissue tension/shortness, progressive postural/UE/functional strengthening and ROM exercises. Education, manual therapy and dry needling as needed.    Luretha Murphy. Ilsa Iha, PT, DPT 03/25/23, 12:00 PM  [facility] [address] [place],  [place] [address] P: [phone] I F: [phone]
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
## 2023-03-29 ENCOUNTER — Telehealth: Payer: Self-pay | Admitting: Student

## 2023-03-29 ENCOUNTER — Ambulatory Visit: Payer: 59 | Admitting: Physical Therapy

## 2023-03-29 ENCOUNTER — Ambulatory Visit (INDEPENDENT_AMBULATORY_CARE_PROVIDER_SITE_OTHER): Payer: 59 | Admitting: Psychiatry

## 2023-03-29 ENCOUNTER — Encounter: Payer: Self-pay | Admitting: Psychiatry

## 2023-03-29 VITALS — BP 128/84 | HR 63 | Temp 97.6°F | Ht 65.0 in | Wt 201.6 lb

## 2023-03-29 DIAGNOSIS — F4310 Post-traumatic stress disorder, unspecified: Secondary | ICD-10-CM | POA: Diagnosis not present

## 2023-03-29 DIAGNOSIS — F411 Generalized anxiety disorder: Secondary | ICD-10-CM

## 2023-03-29 DIAGNOSIS — F259 Schizoaffective disorder, unspecified: Secondary | ICD-10-CM | POA: Diagnosis not present

## 2023-03-29 DIAGNOSIS — G4700 Insomnia, unspecified: Secondary | ICD-10-CM | POA: Diagnosis not present

## 2023-03-29 DIAGNOSIS — R4189 Other symptoms and signs involving cognitive functions and awareness: Secondary | ICD-10-CM

## 2023-03-29 MED ORDER — CLONAZEPAM 1 MG PO TABS: 1.0000 mg | ORAL_TABLET | Freq: Two times a day (BID) | ORAL | 1 refills | Status: DC | PRN

## 2023-03-29 MED ORDER — ZOLPIDEM TARTRATE 5 MG PO TABS: 5.0000 mg | ORAL_TABLET | Freq: Every evening | ORAL | 1 refills | Status: DC | PRN

## 2023-03-29 NOTE — Therapy (Deleted)
OUTPATIENT PHYSICAL THERAPY EVALUATION   Patient Name: Angela Fernandez MRN: [id number] DOB:[birth year], 77 y.o., female Today's Date: 03/29/2023  END OF SESSION:  PCP: Mick Sell, MD  REFERRING PROVIDER: Elijah Birk, MD  REFERRING DIAG: cervical radiculopathy, cervicalgia  THERAPY DIAG:  No diagnosis found.  Rationale for Evaluation and Treatment: Rehabilitation  ONSET DATE: 10/23/2021  PERTINENT HISTORY:  Patient is a 77 y.o. female who presents to outpatient physical therapy with a referral for medical diagnosis cervical radiculopathy, cervicalgia. This patient's chief complaints consist of neck pain and stiffness, leading to the following functional deficits: difficulty with water aerobics, turing to look at things, enjoying nature, sleeping. Relevant past medical history and comorbidities include has Status post reverse total shoulder replacement, right; Chronic venous insufficiency; Lymphedema; Diabetes (HCC); Hyperlipidemia; Essential hypertension; Closed fracture of neck of right scapula; Rotator cuff arthropathy, right; DJD (degenerative joint disease) of cervical spine; Pelvic pressure in female; Cystocele with rectocele; Mixed urinary incontinence due to female genital prolapse; Submucous leiomyoma of uterus; Thickened endometrium; Anxiety, generalized; History of depression; Memory difficulty; S/P reverse total shoulder arthroplasty, right; Hx of sleep apnea; Anemia; and Hemochromatosis carrier on their problem list.  has a past medical history of Anemia, Arthritis, Bipolar affect, depressed (HCC), COPD (chronic obstructive pulmonary disease) (HCC), Diabetes mellitus without complication (HCC), GERD (gastroesophageal reflux disease), Hyperlipidemia, Hypertension, Hypothyroidism, PTSD (post-traumatic stress disorder), Schizoaffective disorder (HCC), Sleep apnea, Stroke (HCC), TIA (transient ischemic attack) (2010), and UTI (urinary tract infection).  has a past surgical  history that includes Shoulder surgery (Right, 2020); Cholecystectomy; Breast surgery (Right); Joint replacement; Replacement total knee bilateral (Bilateral); Eye surgery; Cataract extraction w/ intraocular lens  implant, bilateral (Bilateral); Bilateral carpal tunnel release (Bilateral); Colonoscopy; Esophagogastroduodenoscopy; Nasal sinus surgery; Reverse shoulder arthroplasty (Right, 09/02/2020); Breast biopsy; and Total shoulder revision (Right, 10/23/2021).  Patient denies hx of cancer, seizures, heart problems, unexplained weight loss, unexplained changes in bowel or bladder problems, osteoporosis, and spinal surgery  SUBJECTIVE:                                                                                                                                                                                                         SUBJECTIVE STATEMENT:   PAIN: NPRS:   PATIENT GOALS: "to be able to strengthening my upper arm and get my strength in that where I can hold it up for a while and get that pain under control"  NEXT MD VISIT: don't know  OBJECTIVE  TODAY'S TREATMENT:   Therapeutic exercise: to centralize symptoms and improve ROM, strength, muscular endurance, and  activity tolerance required for successful completion of functional activities.   Vitals: FOTO: PSFS: DNF Endurance test:  PATIENT EDUCATION:  Education details: Exercise purpose/form. Self management techniques. Education on diagnosis, prognosis, POC, anatomy and physiology of current condition. Education on HEP including handout. Person educated: Patient Education method: Explanation, Demonstration, and Handouts Education comprehension: verbalized understanding and needs further education  HOME EXERCISE PROGRAM: Access Code: [facility] URL: [email or web address] Date: 03/25/2023 Prepared by: Norton Blizzard  Exercises - Seated Cervical Rotation AROM  - 3 x daily - 2 sets - 10 reps - standing cervical  flexion/extension AROM  - 3 x daily - 2 sets - 10 reps  ASSESSMENT:  CLINICAL IMPRESSION:    From Initial Evaluation: Patient is a 77 y.o. female referred to outpatient physical therapy with a medical diagnosis of cervical radiculopathy, cervicalgia who presents with signs and symptoms consistent with chronic neck pain and stiffness. Patient demonstrates deficits in cervical spine ROM and posture that are likely contributing to her neck pain. She does not appear to have radiculopathy but does have right shoulder dysfunction with RTSA revision that likely contributes to her neck pain.Patient presents with significant pain, ROM, joint stiffness, muscle performance (strength/power/endurance), balance, muscle tension, motor control, and activity tolerance impairments that are limiting ability to complete her usual activities including difficulty with water aerobics, turing to look at things, enjoying nature, sleeping without difficulty. Patient will benefit from skilled physical therapy intervention to address current body structure impairments and activity limitations to improve function and work towards goals set in current POC in order to return to prior level of function or maximal functional improvement.    OBJECTIVE IMPAIRMENTS: decreased activity tolerance, decreased balance, decreased coordination, decreased endurance, decreased knowledge of condition, decreased mobility, decreased ROM, decreased strength, hypomobility, impaired perceived functional ability, increased muscle spasms, impaired flexibility, impaired UE functional use, improper body mechanics, postural dysfunction, obesity, and pain.   ACTIVITY LIMITATIONS: sleeping and moving head, looking at things  PARTICIPATION LIMITATIONS: interpersonal relationship, community activity, and enjoying nature, participating in water aerobics  PERSONAL FACTORS: Age, Past/current experiences, Time since onset of injury/illness/exacerbation, and 3+  comorbidities:   Status post reverse total shoulder replacement, right; Chronic venous insufficiency; Lymphedema; Diabetes (HCC); Hyperlipidemia; Essential hypertension; Closed fracture of neck of right scapula; Rotator cuff arthropathy, right; DJD (degenerative joint disease) of cervical spine; Pelvic pressure in female; Cystocele with rectocele; Mixed urinary incontinence due to female genital prolapse; Submucous leiomyoma of uterus; Thickened endometrium; Anxiety, generalized; History of depression; Memory difficulty; S/P reverse total shoulder arthroplasty, right; Hx of sleep apnea; Anemia; and Hemochromatosis carrier on their problem list.  has a past medical history of Anemia, Arthritis, Bipolar affect, depressed (HCC), COPD (chronic obstructive pulmonary disease) (HCC), Diabetes mellitus without complication (HCC), GERD (gastroesophageal reflux disease), Hyperlipidemia, Hypertension, Hypothyroidism, PTSD (post-traumatic stress disorder), Schizoaffective disorder (HCC), Sleep apnea, Stroke (HCC), TIA (transient ischemic attack) (2010), and UTI (urinary tract infection).  has a past surgical history that includes Shoulder surgery (Right, 2020); Cholecystectomy; Breast surgery (Right); Joint replacement; Replacement total knee bilateral (Bilateral); Eye surgery; Cataract extraction w/ intraocular lens  implant, bilateral (Bilateral); Bilateral carpal tunnel release (Bilateral); Colonoscopy; Esophagogastroduodenoscopy; Nasal sinus surgery; Reverse shoulder arthroplasty (Right, 09/02/2020); Breast biopsy; and Total shoulder revision (Right, 10/23/2021) are also affecting patient's functional outcome.   REHAB POTENTIAL: Fair due to chronicity of problem, comorbid conditions  CLINICAL DECISION MAKING: Evolving/moderate complexity  EVALUATION COMPLEXITY: Moderate   GOALS: Goals reviewed with patient? No  SHORT TERM GOALS: Target date: 04/07/2023  Patient  will be independent with initial home exercise  program for self-management of symptoms. Baseline: Initial HEP provided at IE (03/24/23); Goal status: INITIAL   LONG TERM GOALS: Target date: 06/16/2023  Patient will be independent with a long-term home exercise program for self-management of symptoms.  Baseline: Initial HEP provided at IE (03/24/23); Goal status: INITIAL  2.  Patient will demonstrate improved FOTO by equal or greater than 10 points by visit #10 to demonstrate improvement in overall condition and self-reported functional ability.  Baseline: to be measured at visit 2 as appropriate - tech malfunction at IE (03/24/23); Goal status: INITIAL  3.  Patient will demonstrate the ability to rotate cervical spine equal or greater than 60 degrees each direction to improve her ability to view her surroundings and enjoy nature.  Baseline: R/L 43/30 (03/24/23); Goal status: INITIAL  4.  Patient will score equal or greater than 39 seconds on the Deep Neck Flexor Endurance Test (DNFET) to match norms for pain free individuals in her age group (norm +1 standard deviation) and improve her ability to enjoy nature and participate in water aerobics.  Baseline: to be tested visit 2 as appropriate (03/24/23); Goal status: INITIAL  5.  Patient will demonstrate improvement in Patient Specific Functional Scale (PSFS) of equal or greater than 3 points to reflect clinically significant improvement in patient's most valued functional activities. Baseline: to be measured visit 2 as appropriate (03/24/23); Goal status: INITIAL  6.  Patient will report decrease in pain during functional activities to equal or less than 3/10 to improve her ability to sleep, view her surroundings, and participate in water aerobics.  Baseline: to be measured visit 2 as appropriate (03/24/23); Goal status: INITIAL    PLAN:  PT FREQUENCY: 1-2x/week  PT DURATION: 8-12 weeks  PLANNED INTERVENTIONS: 97164- PT Re-evaluation, 97110-Therapeutic exercises, 97530-  Therapeutic activity, [id number]- Neuromuscular re-education, 97140- Manual therapy, [id number]- Aquatic Therapy, 97014- Electrical stimulation (unattended), [phone]- Electrical stimulation (manual), Patient/Family education, Balance training, Dry Needling, Joint mobilization, Spinal mobilization, Cryotherapy, and Moist heat  PLAN FOR NEXT SESSION: update HEP as appropriate, address joint restrictions and soft tissue tension/shortness, progressive postural/UE/functional strengthening and ROM exercises. Education, manual therapy and dry needling as needed.    Luretha Murphy. Ilsa Iha, PT, DPT 03/29/23, 2:25 PM  [facility] [address] [place], [place] [address] P: [phone] I F: [phone]

## 2023-03-29 NOTE — Patient Instructions (Signed)
Continue Pristiq 50 mg every day Continue bupropion 200 mg twice a day Continue Latuda 120 mg at night  Continue clonazepam 1 mg twice a day  Continue Ambien 5 mg at night as needed for insomnia Next appointment: 1/16

## 2023-03-29 NOTE — Telephone Encounter (Signed)
Left voicemail for patient after appointment no-show. Asked her to call back and confirm next appointment  Matthew Saras, SPT [facility] [employer]

## 2023-03-30 ENCOUNTER — Ambulatory Visit: Payer: 59 | Admitting: Licensed Clinical Social Worker

## 2023-03-30 DIAGNOSIS — F411 Generalized anxiety disorder: Secondary | ICD-10-CM | POA: Diagnosis not present

## 2023-03-30 DIAGNOSIS — F4310 Post-traumatic stress disorder, unspecified: Secondary | ICD-10-CM | POA: Diagnosis not present

## 2023-03-30 DIAGNOSIS — F259 Schizoaffective disorder, unspecified: Secondary | ICD-10-CM

## 2023-03-30 NOTE — Progress Notes (Signed)
THERAPIST PROGRESS NOTE  Session Time: 9:59am-10:55am  Participation Level: Active  Behavioral Response: CasualAlertEuthymic  Type of Therapy: Individual Therapy  Treatment Goals addressed: STG: Vyvian will identify situations, thoughts, and feelings that trigger internal anger, and/or angry/aggressive actions as evidenced by self-report   ProgressTowards Goals: Progressing  Interventions: Solution Focused, Supportive, and Other: Art Therapy, CBT  Summary: Angela Fernandez is a 77 y.o. female who presents with anxiety.  Patient reports difficulty falling and staying asleep, racing thoughts, uncontrollable worry, irritability.  Patient was oriented x 5.  Patient was cooperative and engaged.  Patient identifies passive suicidal ideations but denies plan or intent at this time.  Patient denies homicidal ideations or auditory/visual hallucinations.  Patient utilized therapeutic space to process recent life events identifying she has encountered some situations that have impacted her mood.  Patient states "I have only been okay."   Reflected on recent visit with psychiatrist.  Patient reports she has been struggling to sleep throughout the night and modified her medication regimen to take her Klonopin at night.  Patient reports she is now sleeping 6 hours a night due to decreased anxious symptoms.  Patient reflected on takeaways and homework from last session identifying she has been using her ACT skills to address negative thoughts about her current living situation and has also been reflecting on the anger iceberg discussed in the previous sessions.  Shared she will be starting physical therapy due to tension in neck /shoulder and lack of mobility.  Patient reports she is encouraged to keep going to the [facility] to engage in aerobics classes.  Patient reflected on recently distressing situation where she accidentally flooded the bathroom.  Became noticeably distressed discussing the financial  strain as she will have to pay for the repairs.  Reflected on conversations with granddaughter and daughter related to the situation.  Patient reports current living situation is "uncomfortable."  Reflected on what she can control versus what she cannot control.  Patient identifies she misses her alone time.  Engaged in art therapy to reflect on current emotional state.  Processed feelings about upcoming holiday gatherings.  Worked with clinician to challenge unhelpful thoughts about the holidays.  Suicidal/Homicidal: Yeswithout intent/plan  Therapist Response: Clinician utilized supportive and reflective listening to create a safe environment for patient to process symptoms and recently distressing events.  Clinician utilized solution focused modalities to address patient's current sleep hygiene and physical wellbeing.  Reviewed ACT skills and anger iceberg discussed in last session.  Encouraged patient to continue her self-care regimen and explored ways patient can volunteer in the community drawing on her love for art.  Supported patient in processing recent dispute with relatives and implications to her finances.  Supported patient and reflecting on what she can control versus what she cannot control.  Engaged patient in art therapy activity.  Utilized CBT approaches to reframe negative thoughts related to spending the holidays with her daughter and granddaughter.  Plan: Return again in 2 weeks.  Diagnosis: Schizoaffective disorder, unspecified type (HCC)  PTSD (post-traumatic stress disorder)  Anxiety state    Collaboration of Care: AEB psychiatrist can access notes and cln. Will review psychiatrists' notes. Check in with the patient and will see LCSW per availability. Patient agreed with treatment recommendations. Pt. is scheduled for a follow-up in 2 weeks.   Patient/Guardian was advised Release of Information must be obtained prior to any record release in order to collaborate their care  with an outside provider. Patient/Guardian was advised if they have not  already done so to contact the registration department to sign all necessary forms in order for [place] to release information regarding their care.   Consent: Patient/Guardian gives verbal consent for treatment and assignment of benefits for services provided during this visit. Patient/Guardian expressed understanding and agreed to proceed.   Dereck Leep, LCSW 03/30/2023

## 2023-04-04 ENCOUNTER — Ambulatory Visit: Payer: 59 | Admitting: Physical Therapy

## 2023-04-06 ENCOUNTER — Ambulatory Visit: Admit: 2023-04-06 | Payer: 59 | Admitting: Gastroenterology

## 2023-04-06 SURGERY — COLONOSCOPY WITH PROPOFOL
Anesthesia: General

## 2023-04-07 DIAGNOSIS — E119 Type 2 diabetes mellitus without complications: Secondary | ICD-10-CM | POA: Diagnosis not present

## 2023-04-11 ENCOUNTER — Ambulatory Visit (INDEPENDENT_AMBULATORY_CARE_PROVIDER_SITE_OTHER): Payer: 59 | Admitting: Licensed Clinical Social Worker

## 2023-04-11 DIAGNOSIS — F4310 Post-traumatic stress disorder, unspecified: Secondary | ICD-10-CM

## 2023-04-11 DIAGNOSIS — F259 Schizoaffective disorder, unspecified: Secondary | ICD-10-CM | POA: Diagnosis not present

## 2023-04-11 DIAGNOSIS — F411 Generalized anxiety disorder: Secondary | ICD-10-CM | POA: Diagnosis not present

## 2023-04-11 NOTE — Progress Notes (Signed)
   THERAPIST PROGRESS NOTE  Session Time: 10:02-10:50am  Participation Level: Active  Behavioral Response: CasualAlertEuthymic  Type of Therapy: Individual Therapy  Treatment Goals addressed: STG: [place]ala will identify situations, thoughts, and feelings that trigger internal anger, and/or angry/aggressive actions as evidenced by self-report   ProgressTowards Goals: Progressing  Interventions: Psychosocial Skills: Communication  and Supportive, Assertiveness Training   Summary: Angela Fernandez is a 77 y.o. female who presents with symptoms of anxiety.  Patient reports uncontrollable worry, irritability, and racing thoughts.  Patient presents today feeling "spacey."  Pt was oriented times 5. Pt was cooperative and engaged. Pt denies SI/HI/AVH.      Patient utilized therapeutic space to process recent holiday.  Patient reports she enjoyed time with her family and address positive social interactions with daughter and granddaughters.  Patient expressed excitement about getting back into the hobby of painting.  Patient became tearful citing homesickness related to her former home of [place].  Patient expressed concerns about recent weather in [place] reporting she wanted to reach out to an old friend to check on their safety.  Patient identified barriers related to reaching out.  Worked with clinician to problem solve related to barriers interfering with her ability to reach out to this friend.  Patient expressed pride in her ability to socialize more sharing she had reached out to her sister-in-law and another friend.   Patient continues to report behaviors of itching when she gets nervous/uncomfortable to the point where she creates scars and scabs.  Patient identified she only notices the symptoms when she is communicating with her daughter and son-in-law.  Patient identifies she continues to suppress her anger, isolates, and is avoidant around her daughter as she is fearful she will be  labeled as ungrateful.  Patient continues to address difficulties communicating with daughter and identifies a goal of utilizing her voice.  Clinician explored patient's feelings about writing a letter to her daughter to avoid "blowups."  For homework, patient was asked to think through the pros and cons to writing a letter expressing her feelings for her daughter.  Suicidal/Homicidal: Nowithout intent/plan  Therapist Response: Clinician utilized active and supportive reflection to create a safe environment for patient to process recent life stressors and symptoms.  Clinician utilized assertiveness techniques and processed ways in which patient can utilize healthy forms of communication to address feelings with relatives regarding her care.  Encourage patient to set healthy boundaries.  Plan: Return again in 1 week.  Diagnosis: Schizoaffective disorder, unspecified type (HCC)  PTSD (post-traumatic stress disorder)  Anxiety state   Collaboration of Care: AEB psychiatrist can access notes and cln. Will review psychiatrists' notes. Check in with the patient and will see LCSW per availability. Patient agreed with treatment recommendations.  Patient/Guardian was advised Release of Information must be obtained prior to any record release in order to collaborate their care with an outside provider. Patient/Guardian was advised if they have not already done so to contact the registration department to sign all necessary forms in order for [place] to release information regarding their care.   Consent: Patient/Guardian gives verbal consent for treatment and assignment of benefits for services provided during this visit. Patient/Guardian expressed understanding and agreed to proceed.   Dereck Leep, LCSW 04/11/2023

## 2023-04-13 ENCOUNTER — Ambulatory Visit: Payer: 59 | Admitting: Licensed Clinical Social Worker

## 2023-04-14 ENCOUNTER — Encounter: Payer: Self-pay | Admitting: Physical Therapy

## 2023-04-14 ENCOUNTER — Ambulatory Visit: Payer: 59 | Attending: Physical Medicine & Rehabilitation | Admitting: Physical Therapy

## 2023-04-14 VITALS — BP 123/77 | HR 60

## 2023-04-14 DIAGNOSIS — R293 Abnormal posture: Secondary | ICD-10-CM | POA: Diagnosis not present

## 2023-04-14 DIAGNOSIS — M6281 Muscle weakness (generalized): Secondary | ICD-10-CM | POA: Diagnosis not present

## 2023-04-14 DIAGNOSIS — M542 Cervicalgia: Secondary | ICD-10-CM | POA: Insufficient documentation

## 2023-04-14 NOTE — Therapy (Signed)
OUTPATIENT PHYSICAL THERAPY TREATMENT   Patient Name: Angela Fernandez MRN: [id number] DOB:17-Feb-1946, 77 y.o., female Today's Date: 04/14/2023  END OF SESSION:  PT End of Session - 04/14/23 1318     Visit Number 2    Number of Visits 13    Date for PT Re-Evaluation 06/16/23    Authorization Type [employer] reporting period from 03/24/2023    Progress Note Due on Visit 10    PT Start Time 1345    PT Stop Time 1440    PT Time Calculation (min) 55 min    Activity Tolerance Patient tolerated treatment well    Behavior During Therapy WFL for tasks assessed/performed            PCP: Mick Sell, MD  REFERRING PROVIDER: Elijah Birk, MD  REFERRING DIAG: cervical radiculopathy, cervicalgia  THERAPY DIAG:  Cervicalgia  Abnormal posture  Muscle weakness (generalized)  Rationale for Evaluation and Treatment: Rehabilitation  ONSET DATE: 10/23/2021  PERTINENT HISTORY:  Patient is a 77 y.o. female who presents to outpatient physical therapy with a referral for medical diagnosis cervical radiculopathy, cervicalgia. This patient's chief complaints consist of neck pain and stiffness, leading to the following functional deficits: difficulty with water aerobics, turing to look at things, enjoying nature, sleeping. Relevant past medical history and comorbidities include has Status post reverse total shoulder replacement, right; Chronic venous insufficiency; Lymphedema; Diabetes (HCC); Hyperlipidemia; Essential hypertension; Closed fracture of neck of right scapula; Rotator cuff arthropathy, right; DJD (degenerative joint disease) of cervical spine; Pelvic pressure in female; Cystocele with rectocele; Mixed urinary incontinence due to female genital prolapse; Submucous leiomyoma of uterus; Thickened endometrium; Anxiety, generalized; History of depression; Memory difficulty; S/P reverse total shoulder arthroplasty, right; Hx of sleep apnea; Anemia; and  Hemochromatosis carrier on their problem list.  has a past medical history of Anemia, Arthritis, Bipolar affect, depressed (HCC), COPD (chronic obstructive pulmonary disease) (HCC), Diabetes mellitus without complication (HCC), GERD (gastroesophageal reflux disease), Hyperlipidemia, Hypertension, Hypothyroidism, PTSD (post-traumatic stress disorder), Schizoaffective disorder (HCC), Sleep apnea, Stroke (HCC), TIA (transient ischemic attack) (2010), and UTI (urinary tract infection).  has a past surgical history that includes Shoulder surgery (Right, 2020); Cholecystectomy; Breast surgery (Right); Joint replacement; Replacement total knee bilateral (Bilateral); Eye surgery; Cataract extraction w/ intraocular lens  implant, bilateral (Bilateral); Bilateral carpal tunnel release (Bilateral); Colonoscopy; Esophagogastroduodenoscopy; Nasal sinus surgery; Reverse shoulder arthroplasty (Right, 09/02/2020); Breast biopsy; and Total shoulder revision (Right, 10/23/2021).  Patient denies hx of cancer, seizures, heart problems, unexplained weight loss, unexplained changes in bowel or bladder problems, osteoporosis, and spinal surgery  SUBJECTIVE:  SUBJECTIVE STATEMENT: Patient states she has been stiff since last PT session. She states she thinks the exercises are helping and swimming 3 times a week is hard but good for her.   PAIN: NPRS: 4/10 left base of neck   PATIENT GOALS: "to be able to strengthening my upper arm and get my strength in that where I can hold it up for a while and get that pain under control"   OBJECTIVE  Vitals:   04/14/23 1359  BP: 123/77  Pulse: 60  SpO2: 100%  Automatic cuff  SELF-REPORTED FUNCTION FOTO score: 39/100 (neck questionnaire)  SELF-REPORTED FUNCTION Patient Specific  Functional Scale (PSFS)  vacuuming: 5 Turning head: 3 sleeping: 3 Average: 3.7  Deep Neck Flexor Endurance Test: 90 seconds.   Palpation to cervical spine: TTP especially at right distal upper trap and left proximal upper trap, as well as suboccipital muscles (R > L).   CPA to upper thoracic spine and cervical spine: TTP at upper thoracic spine and mid cervical spine.   Pt required multimodal cuing for proper technique and to facilitate improved neuromuscular control, strength, range of motion, and functional ability resulting in improved performance and form.  TODAY'S TREATMENT:   Therapeutic exercise: to centralize symptoms and improve ROM, strength, muscular endurance, and activity tolerance required for successful completion of functional activities.  - vitals measurement for baseline/safety - DNF Endurance test: (see above) (Manual therapy - see below) - supine B horizontal shoulder abduction with scapular depression and retraction, 3x20 with BlueTB (30 second rests, 2-0-2 tempo) - supine diagonal pull aparts, 2x20 each way (once with BlueTB - painful, once with RTB).  - Education on HEP including handout   Manual therapy: to reduce pain and tissue tension, improve range of motion, neuromodulation, in order to promote improved ability to complete functional activities. SUPINE - STM to posterior cervical spine musculature, focusing on tender/tight spots at bilateral UT and suboccipital muscles.  - CPA grade II-III along cervical spine  Pt required multimodal cuing for proper technique and to facilitate improved neuromuscular control, strength, range of motion, and functional ability resulting in improved performance and form.   PATIENT EDUCATION:  Education details: Exercise purpose/form. Self management techniques. Education on diagnosis, prognosis, POC, anatomy and physiology of current condition. Education on HEP including handout. Person educated: Patient Education method:  Explanation, Demonstration, and Handouts Education comprehension: verbalized understanding and needs further education  HOME EXERCISE PROGRAM: Access Code: [facility] URL: [email or web address] Date: 04/14/2023 Prepared by: Norton Blizzard  Exercises - Seated Cervical Rotation AROM  - 2 x daily - 2 sets - 10 reps - standing cervical flexion/extension AROM  - 2 x daily - 2 sets - 10 reps - Supine Shoulder Horizontal Abduction with Resistance  - 3 x weekly - 3 sets - 20 reps - 30 seconds rest - 2 seconds each way tempo - blue band color - Seated Shoulder Diagonal with Resistance  - 3 x weekly - 3 sets - 20 reps - 2 seconds each way tempo - 30 seconds rest - red band color  ASSESSMENT:  CLINICAL IMPRESSION: Patient arrives reporting continued stiffness and discomfort in neck but good participation in HEP 2x a day. Today's session focused on updating HEP and starting postural endurance exercises. Manual assessment of cervical spine revealed tender and tight bilateral upper trap (right more distal, left more proximal) and pain with CPA at upper thoracic and mid cervical spine. Patient was challenged by exercises and required cuing including a metromome for improved tempo  and technique, but required minimal cuing by end of session. Plan to continue with motor control/endurance exercises for posture and shoulder girdle with progressions for strength as appropriate. Patient would benefit from continued management of limiting condition by skilled physical therapist to address remaining impairments and functional limitations to work towards stated goals and return to PLOF or maximal functional independence.   From Initial Evaluation: Patient is a 77 y.o. female referred to outpatient physical therapy with a medical diagnosis of cervical radiculopathy, cervicalgia who presents with signs and symptoms consistent with chronic neck pain and stiffness. Patient demonstrates deficits in cervical spine  ROM and posture that are likely contributing to her neck pain. She does not appear to have radiculopathy but does have right shoulder dysfunction with RTSA revision that likely contributes to her neck pain.Patient presents with significant pain, ROM, joint stiffness, muscle performance (strength/power/endurance), balance, muscle tension, motor control, and activity tolerance impairments that are limiting ability to complete her usual activities including difficulty with water aerobics, turing to look at things, enjoying nature, sleeping without difficulty. Patient will benefit from skilled physical therapy intervention to address current body structure impairments and activity limitations to improve function and work towards goals set in current POC in order to return to prior level of function or maximal functional improvement.    OBJECTIVE IMPAIRMENTS: decreased activity tolerance, decreased balance, decreased coordination, decreased endurance, decreased knowledge of condition, decreased mobility, decreased ROM, decreased strength, hypomobility, impaired perceived functional ability, increased muscle spasms, impaired flexibility, impaired UE functional use, improper body mechanics, postural dysfunction, obesity, and pain.   ACTIVITY LIMITATIONS: sleeping and moving head, looking at things  PARTICIPATION LIMITATIONS: interpersonal relationship, community activity, and enjoying nature, participating in water aerobics  PERSONAL FACTORS: Age, Past/current experiences, Time since onset of injury/illness/exacerbation, and 3+ comorbidities:   Status post reverse total shoulder replacement, right; Chronic venous insufficiency; Lymphedema; Diabetes (HCC); Hyperlipidemia; Essential hypertension; Closed fracture of neck of right scapula; Rotator cuff arthropathy, right; DJD (degenerative joint disease) of cervical spine; Pelvic pressure in female; Cystocele with rectocele; Mixed urinary incontinence due to female  genital prolapse; Submucous leiomyoma of uterus; Thickened endometrium; Anxiety, generalized; History of depression; Memory difficulty; S/P reverse total shoulder arthroplasty, right; Hx of sleep apnea; Anemia; and Hemochromatosis carrier on their problem list.  has a past medical history of Anemia, Arthritis, Bipolar affect, depressed (HCC), COPD (chronic obstructive pulmonary disease) (HCC), Diabetes mellitus without complication (HCC), GERD (gastroesophageal reflux disease), Hyperlipidemia, Hypertension, Hypothyroidism, PTSD (post-traumatic stress disorder), Schizoaffective disorder (HCC), Sleep apnea, Stroke (HCC), TIA (transient ischemic attack) (2010), and UTI (urinary tract infection).  has a past surgical history that includes Shoulder surgery (Right, 2020); Cholecystectomy; Breast surgery (Right); Joint replacement; Replacement total knee bilateral (Bilateral); Eye surgery; Cataract extraction w/ intraocular lens  implant, bilateral (Bilateral); Bilateral carpal tunnel release (Bilateral); Colonoscopy; Esophagogastroduodenoscopy; Nasal sinus surgery; Reverse shoulder arthroplasty (Right, 09/02/2020); Breast biopsy; and Total shoulder revision (Right, 10/23/2021) are also affecting patient's functional outcome.   REHAB POTENTIAL: Fair due to chronicity of problem, comorbid conditions  CLINICAL DECISION MAKING: Evolving/moderate complexity  EVALUATION COMPLEXITY: Moderate   GOALS: Goals reviewed with patient? No  SHORT TERM GOALS: Target date: 04/07/2023  Patient will be independent with initial home exercise program for self-management of symptoms. Baseline: Initial HEP provided at IE (03/24/23); Goal status: In-progress   LONG TERM GOALS: Target date: 06/16/2023  Patient will be independent with a long-term home exercise program for self-management of symptoms.  Baseline: Initial HEP provided at IE (03/24/23); Goal status:  In-progress  2.  Patient will demonstrate improved FOTO by  equal or greater than 10 points by visit #10 to demonstrate improvement in overall condition and self-reported functional ability.  Baseline: to be measured at visit 2 as appropriate - tech malfunction at IE (03/24/23); 39 at visit #2 (04/14/2023);  Goal status: In-progress  3.  Patient will demonstrate the ability to rotate cervical spine equal or greater than 60 degrees each direction to improve her ability to view her surroundings and enjoy nature.  Baseline: R/L 43/30 (03/24/23); Goal status: In-progress  4.  Patient will score equal or greater than 39 seconds on the Deep Neck Flexor Endurance Test (DNFET) to match norms for pain free individuals in her age group (norm +1 standard deviation) and improve her ability to enjoy nature and participate in water aerobics.  Baseline: to be tested visit 2 as appropriate (03/24/23); 90 seconds (04/13/2023);  Goal status: MET  5.  Patient will demonstrate improvement in Patient Specific Functional Scale (PSFS) of equal or greater than 3 points to reflect clinically significant improvement in patient's most valued functional activities. Baseline: to be measured visit 2 as appropriate (03/24/23); 3.7 seconds (04/14/2023);  Goal status: In-progress  6.  Patient will report decrease in pain during functional activities to equal or less than 3/10 to improve her ability to sleep, view her surroundings, and participate in water aerobics.  Baseline: to be measured visit 2 as appropriate (03/24/23); Goal status: In-progress    PLAN:  PT FREQUENCY: 1-2x/week  PT DURATION: 8-12 weeks  PLANNED INTERVENTIONS: 97164- PT Re-evaluation, 97110-Therapeutic exercises, 97530- Therapeutic activity, [id number]- Neuromuscular re-education, 97140- Manual therapy, [id number]- Aquatic Therapy, 97014- Electrical stimulation (unattended), [phone]- Electrical stimulation (manual), Patient/Family education, Balance training, Dry Needling, Joint mobilization, Spinal mobilization,  Cryotherapy, and Moist heat  PLAN FOR NEXT SESSION: update HEP as appropriate, address joint restrictions and soft tissue tension/shortness, progressive postural/UE/functional strengthening and ROM exercises. Education, manual therapy and dry needling as needed.    Luretha Murphy. Ilsa Iha, PT, DPT 04/14/23, 3:40 PM  [facility] [address] [place], [place] [address] P: [phone] I F: [phone]

## 2023-04-15 DIAGNOSIS — G6289 Other specified polyneuropathies: Secondary | ICD-10-CM | POA: Diagnosis not present

## 2023-04-15 DIAGNOSIS — E7800 Pure hypercholesterolemia, unspecified: Secondary | ICD-10-CM | POA: Diagnosis not present

## 2023-04-15 DIAGNOSIS — N1831 Chronic kidney disease, stage 3a: Secondary | ICD-10-CM | POA: Diagnosis not present

## 2023-04-15 DIAGNOSIS — I10 Essential (primary) hypertension: Secondary | ICD-10-CM | POA: Diagnosis not present

## 2023-04-15 DIAGNOSIS — E119 Type 2 diabetes mellitus without complications: Secondary | ICD-10-CM | POA: Diagnosis not present

## 2023-04-15 DIAGNOSIS — E039 Hypothyroidism, unspecified: Secondary | ICD-10-CM | POA: Diagnosis not present

## 2023-04-18 ENCOUNTER — Ambulatory Visit: Payer: 59 | Admitting: Physical Therapy

## 2023-04-18 ENCOUNTER — Encounter: Payer: Self-pay | Admitting: Physical Therapy

## 2023-04-18 DIAGNOSIS — M6281 Muscle weakness (generalized): Secondary | ICD-10-CM | POA: Diagnosis not present

## 2023-04-18 DIAGNOSIS — R293 Abnormal posture: Secondary | ICD-10-CM

## 2023-04-18 DIAGNOSIS — M542 Cervicalgia: Secondary | ICD-10-CM

## 2023-04-18 NOTE — Therapy (Signed)
OUTPATIENT PHYSICAL THERAPY TREATMENT   Patient Name: Angela Fernandez MRN: [id number] DOB:1945-10-25, 77 y.o., female Today's Date: 04/18/2023  END OF SESSION:  PT End of Session - 04/18/23 0950     Visit Number 3    Number of Visits 13    Date for PT Re-Evaluation 06/16/23    Authorization Type [employer] reporting period from 03/24/2023    Progress Note Due on Visit 10    PT Start Time 0947    PT Stop Time 1027    PT Time Calculation (min) 40 min    Activity Tolerance Patient tolerated treatment well    Behavior During Therapy WFL for tasks assessed/performed             PCP: Mick Sell, MD  REFERRING PROVIDER: Elijah Birk, MD  REFERRING DIAG: cervical radiculopathy, cervicalgia  THERAPY DIAG:  Cervicalgia  Abnormal posture  Muscle weakness (generalized)  Rationale for Evaluation and Treatment: Rehabilitation  ONSET DATE: 10/23/2021  PERTINENT HISTORY:  Patient is a 77 y.o. female who presents to outpatient physical therapy with a referral for medical diagnosis cervical radiculopathy, cervicalgia. This patient's chief complaints consist of neck pain and stiffness, leading to the following functional deficits: difficulty with water aerobics, turing to look at things, enjoying nature, sleeping. Relevant past medical history and comorbidities include has Status post reverse total shoulder replacement, right; Chronic venous insufficiency; Lymphedema; Diabetes (HCC); Hyperlipidemia; Essential hypertension; Closed fracture of neck of right scapula; Rotator cuff arthropathy, right; DJD (degenerative joint disease) of cervical spine; Pelvic pressure in female; Cystocele with rectocele; Mixed urinary incontinence due to female genital prolapse; Submucous leiomyoma of uterus; Thickened endometrium; Anxiety, generalized; History of depression; Memory difficulty; S/P reverse total shoulder arthroplasty, right; Hx of sleep apnea; Anemia; and  Hemochromatosis carrier on their problem list.  has a past medical history of Anemia, Arthritis, Bipolar affect, depressed (HCC), COPD (chronic obstructive pulmonary disease) (HCC), Diabetes mellitus without complication (HCC), GERD (gastroesophageal reflux disease), Hyperlipidemia, Hypertension, Hypothyroidism, PTSD (post-traumatic stress disorder), Schizoaffective disorder (HCC), Sleep apnea, Stroke (HCC), TIA (transient ischemic attack) (2010), and UTI (urinary tract infection).  has a past surgical history that includes Shoulder surgery (Right, 2020); Cholecystectomy; Breast surgery (Right); Joint replacement; Replacement total knee bilateral (Bilateral); Eye surgery; Cataract extraction w/ intraocular lens  implant, bilateral (Bilateral); Bilateral carpal tunnel release (Bilateral); Colonoscopy; Esophagogastroduodenoscopy; Nasal sinus surgery; Reverse shoulder arthroplasty (Right, 09/02/2020); Breast biopsy; and Total shoulder revision (Right, 10/23/2021).  Patient denies hx of cancer, seizures, heart problems, unexplained weight loss, unexplained changes in bowel or bladder problems, osteoporosis, and spinal surgery  SUBJECTIVE:  SUBJECTIVE STATEMENT: Patient states she has been doing her HEP. She states it feels bad while she does the exercises, but better afterwards.   PAIN: NPRS: 0/10  PRECAUTIONS: Fall, no lifting heavy things with right UE (maybe 5-6# unsure, definitely not big bags like 30#)   PATIENT GOALS: "to be able to strengthening my upper arm and get my strength in that where I can hold it up for a while and get that pain under control"   OBJECTIVE  TODAY'S TREATMENT:   Therapeutic exercise: to centralize symptoms and improve ROM, strength, muscular endurance, and activity tolerance  required for successful completion of functional activities.  - supine B horizontal shoulder abduction with scapular depression and retraction, 1x20 with BlueTB. - supine diagonal pull aparts, 2x20 each way (attempted BlueTB - painful, changed to GTB).  - quadruped cervicothoracic rotation with hand behind head, 3x5 each side. (Unable to maintain R single UE support longer than 5 reps).   Therapeutic activities: dynamic activities for functional strengthening and improved functional activity tolerance. - single arm carry while walking between Blazepods set in 5 pod home base configuration. Approx 15 feet apart in square, 2x1 min each side walking around room, 5# DB hanging by YTB. 5/7 hits. (Load easy, balance feels challenging per pt report. Consider carry without blazepods due to it causing her to to look down). Exercise to improve posture and postural strength/endurance while performing functional activity.  - squats with 8.8 lb bar (from sled) in back squat position, using straps to hands to hold it in place. Sit <> stand from 17 inch chair, 1x7, 2x5 with approx 1 min rest between sets. Exercise to improve postural strength/endurance in functional position and improve scapular retraction.   Pt required multimodal cuing for proper technique and to facilitate improved neuromuscular control, strength, range of motion, and functional ability resulting in improved performance and form.   PATIENT EDUCATION:  Education details: Exercise purpose/form. Self management techniques. Education on diagnosis, prognosis, POC, anatomy and physiology of current condition. Education on HEP including handout. Person educated: Patient Education method: Explanation, Demonstration, and Handouts Education comprehension: verbalized understanding and needs further education  HOME EXERCISE PROGRAM: Access Code: [facility] URL: [email or web address] Date: 04/14/2023 Prepared by: Norton Blizzard  Exercises -  Seated Cervical Rotation AROM  - 2 x daily - 2 sets - 10 reps - standing cervical flexion/extension AROM  - 2 x daily - 2 sets - 10 reps - Supine Shoulder Horizontal Abduction with Resistance  - 3 x weekly - 3 sets - 20 reps - 30 seconds rest - 2 seconds each way tempo - blue band color - Seated Shoulder Diagonal with Resistance  - 3 x weekly - 3 sets - 20 reps - 2 seconds each way tempo - 30 seconds rest - red band color  ASSESSMENT:  CLINICAL IMPRESSION: Patient arrives reporting good improvement in pain but incorrect form with HEP. Patient needed cuing to perform HEP correctly and was able to progress to GTB for diagonal periscapular exercises. She was able to progress to more functional positions while continuing to work on postural strength and endurance to help reduce neck pain. She tolerated session well overall, reporting no pain and that she felt straighter by end of session. Patient would benefit from continued management of limiting condition by skilled physical therapist to address remaining impairments and functional limitations to work towards stated goals and return to PLOF or maximal functional independence.    From Initial Evaluation: Patient is a 77 y.o. female  referred to outpatient physical therapy with a medical diagnosis of cervical radiculopathy, cervicalgia who presents with signs and symptoms consistent with chronic neck pain and stiffness. Patient demonstrates deficits in cervical spine ROM and posture that are likely contributing to her neck pain. She does not appear to have radiculopathy but does have right shoulder dysfunction with RTSA revision that likely contributes to her neck pain.Patient presents with significant pain, ROM, joint stiffness, muscle performance (strength/power/endurance), balance, muscle tension, motor control, and activity tolerance impairments that are limiting ability to complete her usual activities including difficulty with water aerobics, turing to  look at things, enjoying nature, sleeping without difficulty. Patient will benefit from skilled physical therapy intervention to address current body structure impairments and activity limitations to improve function and work towards goals set in current POC in order to return to prior level of function or maximal functional improvement.    OBJECTIVE IMPAIRMENTS: decreased activity tolerance, decreased balance, decreased coordination, decreased endurance, decreased knowledge of condition, decreased mobility, decreased ROM, decreased strength, hypomobility, impaired perceived functional ability, increased muscle spasms, impaired flexibility, impaired UE functional use, improper body mechanics, postural dysfunction, obesity, and pain.   ACTIVITY LIMITATIONS: sleeping and moving head, looking at things  PARTICIPATION LIMITATIONS: interpersonal relationship, community activity, and enjoying nature, participating in water aerobics  PERSONAL FACTORS: Age, Past/current experiences, Time since onset of injury/illness/exacerbation, and 3+ comorbidities:   Status post reverse total shoulder replacement, right; Chronic venous insufficiency; Lymphedema; Diabetes (HCC); Hyperlipidemia; Essential hypertension; Closed fracture of neck of right scapula; Rotator cuff arthropathy, right; DJD (degenerative joint disease) of cervical spine; Pelvic pressure in female; Cystocele with rectocele; Mixed urinary incontinence due to female genital prolapse; Submucous leiomyoma of uterus; Thickened endometrium; Anxiety, generalized; History of depression; Memory difficulty; S/P reverse total shoulder arthroplasty, right; Hx of sleep apnea; Anemia; and Hemochromatosis carrier on their problem list.  has a past medical history of Anemia, Arthritis, Bipolar affect, depressed (HCC), COPD (chronic obstructive pulmonary disease) (HCC), Diabetes mellitus without complication (HCC), GERD (gastroesophageal reflux disease), Hyperlipidemia,  Hypertension, Hypothyroidism, PTSD (post-traumatic stress disorder), Schizoaffective disorder (HCC), Sleep apnea, Stroke (HCC), TIA (transient ischemic attack) (2010), and UTI (urinary tract infection).  has a past surgical history that includes Shoulder surgery (Right, 2020); Cholecystectomy; Breast surgery (Right); Joint replacement; Replacement total knee bilateral (Bilateral); Eye surgery; Cataract extraction w/ intraocular lens  implant, bilateral (Bilateral); Bilateral carpal tunnel release (Bilateral); Colonoscopy; Esophagogastroduodenoscopy; Nasal sinus surgery; Reverse shoulder arthroplasty (Right, 09/02/2020); Breast biopsy; and Total shoulder revision (Right, 10/23/2021) are also affecting patient's functional outcome.   REHAB POTENTIAL: Fair due to chronicity of problem, comorbid conditions  CLINICAL DECISION MAKING: Evolving/moderate complexity  EVALUATION COMPLEXITY: Moderate   GOALS: Goals reviewed with patient? No  SHORT TERM GOALS: Target date: 04/07/2023  Patient will be independent with initial home exercise program for self-management of symptoms. Baseline: Initial HEP provided at IE (03/24/23); Goal status: In-progress   LONG TERM GOALS: Target date: 06/16/2023  Patient will be independent with a long-term home exercise program for self-management of symptoms.  Baseline: Initial HEP provided at IE (03/24/23); Goal status: In-progress  2.  Patient will demonstrate improved FOTO by equal or greater than 10 points by visit #10 to demonstrate improvement in overall condition and self-reported functional ability.  Baseline: to be measured at visit 2 as appropriate - tech malfunction at IE (03/24/23); 39 at visit #2 (04/14/2023);  Goal status: In-progress  3.  Patient will demonstrate the ability to rotate cervical spine equal or greater than 60 degrees  each direction to improve her ability to view her surroundings and enjoy nature.  Baseline: R/L 43/30 (03/24/23); Goal  status: In-progress  4.  Patient will score equal or greater than 39 seconds on the Deep Neck Flexor Endurance Test (DNFET) to match norms for pain free individuals in her age group (norm +1 standard deviation) and improve her ability to enjoy nature and participate in water aerobics.  Baseline: to be tested visit 2 as appropriate (03/24/23); 90 seconds (04/13/2023);  Goal status: MET  5.  Patient will demonstrate improvement in Patient Specific Functional Scale (PSFS) of equal or greater than 3 points to reflect clinically significant improvement in patient's most valued functional activities. Baseline: to be measured visit 2 as appropriate (03/24/23); 3.7 seconds (04/14/2023);  Goal status: In-progress  6.  Patient will report decrease in pain during functional activities to equal or less than 3/10 to improve her ability to sleep, view her surroundings, and participate in water aerobics.  Baseline: to be measured visit 2 as appropriate (03/24/23); Goal status: In-progress    PLAN:  PT FREQUENCY: 1-2x/week  PT DURATION: 8-12 weeks  PLANNED INTERVENTIONS: 97164- PT Re-evaluation, 97110-Therapeutic exercises, 97530- Therapeutic activity, [id number]- Neuromuscular re-education, 97140- Manual therapy, [id number]- Aquatic Therapy, 97014- Electrical stimulation (unattended), [phone]- Electrical stimulation (manual), Patient/Family education, Balance training, Dry Needling, Joint mobilization, Spinal mobilization, Cryotherapy, and Moist heat  PLAN FOR NEXT SESSION: update HEP as appropriate, address joint restrictions and soft tissue tension/shortness, progressive postural/UE/functional strengthening and ROM exercises. Education, manual therapy and dry needling as needed.    Luretha Murphy. Ilsa Iha, PT, DPT 04/18/23, 11:03 AM  [facility] [address] [place], [place] [address] P: [phone] I F: [phone]

## 2023-04-20 DIAGNOSIS — Z794 Long term (current) use of insulin: Secondary | ICD-10-CM | POA: Diagnosis not present

## 2023-04-20 DIAGNOSIS — J449 Chronic obstructive pulmonary disease, unspecified: Secondary | ICD-10-CM | POA: Diagnosis not present

## 2023-04-20 DIAGNOSIS — I10 Essential (primary) hypertension: Secondary | ICD-10-CM | POA: Diagnosis not present

## 2023-04-20 DIAGNOSIS — E785 Hyperlipidemia, unspecified: Secondary | ICD-10-CM | POA: Diagnosis not present

## 2023-04-20 DIAGNOSIS — M542 Cervicalgia: Secondary | ICD-10-CM | POA: Diagnosis not present

## 2023-04-20 DIAGNOSIS — E119 Type 2 diabetes mellitus without complications: Secondary | ICD-10-CM | POA: Diagnosis not present

## 2023-04-21 ENCOUNTER — Encounter: Payer: Self-pay | Admitting: Physical Therapy

## 2023-04-21 ENCOUNTER — Ambulatory Visit: Payer: 59 | Admitting: Physical Therapy

## 2023-04-21 DIAGNOSIS — M6281 Muscle weakness (generalized): Secondary | ICD-10-CM

## 2023-04-21 DIAGNOSIS — M542 Cervicalgia: Secondary | ICD-10-CM

## 2023-04-21 DIAGNOSIS — R293 Abnormal posture: Secondary | ICD-10-CM | POA: Diagnosis not present

## 2023-04-21 NOTE — Therapy (Addendum)
OUTPATIENT PHYSICAL THERAPY TREATMENT   Patient Name: Angela Fernandez MRN: [id number] DOB:[birth year], 77 y.o., female Today's Date: 04/21/2023  END OF SESSION:  PT End of Session - 04/21/23 1839     Visit Number 4    Number of Visits 13    Date for PT Re-Evaluation 06/16/23    Authorization Type [employer] reporting period from 03/24/2023    Progress Note Due on Visit 10    PT Start Time 1305    PT Stop Time 1345    PT Time Calculation (min) 40 min    Activity Tolerance Patient tolerated treatment well    Behavior During Therapy WFL for tasks assessed/performed              PCP: Mick Sell, MD  REFERRING PROVIDER: Elijah Birk, MD  REFERRING DIAG: cervical radiculopathy, cervicalgia  THERAPY DIAG:  Cervicalgia  Abnormal posture  Muscle weakness (generalized)  Rationale for Evaluation and Treatment: Rehabilitation  ONSET DATE: 10/23/2021  PERTINENT HISTORY:  Patient is a 77 y.o. female who presents to outpatient physical therapy with a referral for medical diagnosis cervical radiculopathy, cervicalgia. This patient's chief complaints consist of neck pain and stiffness, leading to the following functional deficits: difficulty with water aerobics, turing to look at things, enjoying nature, sleeping. Relevant past medical history and comorbidities include has Status post reverse total shoulder replacement, right; Chronic venous insufficiency; Lymphedema; Diabetes (HCC); Hyperlipidemia; Essential hypertension; Closed fracture of neck of right scapula; Rotator cuff arthropathy, right; DJD (degenerative joint disease) of cervical spine; Pelvic pressure in female; Cystocele with rectocele; Mixed urinary incontinence due to female genital prolapse; Submucous leiomyoma of uterus; Thickened endometrium; Anxiety, generalized; History of depression; Memory difficulty; S/P reverse total shoulder arthroplasty, right; Hx of sleep apnea; Anemia; and  Hemochromatosis carrier on their problem list.  has a past medical history of Anemia, Arthritis, Bipolar affect, depressed (HCC), COPD (chronic obstructive pulmonary disease) (HCC), Diabetes mellitus without complication (HCC), GERD (gastroesophageal reflux disease), Hyperlipidemia, Hypertension, Hypothyroidism, PTSD (post-traumatic stress disorder), Schizoaffective disorder (HCC), Sleep apnea, Stroke (HCC), TIA (transient ischemic attack) (2010), and UTI (urinary tract infection).  has a past surgical history that includes Shoulder surgery (Right, 2020); Cholecystectomy; Breast surgery (Right); Joint replacement; Replacement total knee bilateral (Bilateral); Eye surgery; Cataract extraction w/ intraocular lens  implant, bilateral (Bilateral); Bilateral carpal tunnel release (Bilateral); Colonoscopy; Esophagogastroduodenoscopy; Nasal sinus surgery; Reverse shoulder arthroplasty (Right, 09/02/2020); Breast biopsy; and Total shoulder revision (Right, 10/23/2021).  Patient denies hx of cancer, seizures, heart problems, unexplained weight loss, unexplained changes in bowel or bladder problems, osteoporosis, and spinal surgery  SUBJECTIVE:  SUBJECTIVE STATEMENT: Patient states she was sore in her quads and upper traps since last PT session  PAIN: NPRS: 2/10 in the neck, especially when she looks up.  PRECAUTIONS: Fall, no lifting heavy things with right UE (maybe 5-6# unsure, definitely not big bags like 30#)   PATIENT GOALS: "to be able to strengthening my upper arm and get my strength in that where I can hold it up for a while and get that pain under control"   OBJECTIVE  TODAY'S TREATMENT:   Therapeutic exercise: to centralize symptoms and improve ROM, strength, muscular endurance, and activity tolerance  required for successful completion of functional activities. -  NuStep level 5 using bilateral upper and lower extremities. Seat/handle setting 9/9. For improved extremity mobility, muscular endurance, and activity tolerance; and to induce the analgesic effect of aerobic exercise, stimulate improved joint nutrition, and prepare body structures and systems for following interventions. x 6  minutes. Average SPM = 56. - seated cervical retraction AROM, 2x10 - seated cervical retraction AROM with self overpressure, 1x10 - cervical spine AROM rotation and flexion/extension between manual and retraction interventions to assess response/effectiveness of interventions.    Therapeutic activities: dynamic activities for functional strengthening and improved functional activity tolerance. - squats with 8.8 lb bar (from sled) in back squat position, using straps to hands to hold it in place. Squat touch or as low as possible to 17 inch chair, 3x14/13/15 approx 1 min rest between sets. Exercise to improve postural strength/endurance in functional position and improve scapular retraction. - single arm carry while walking around room, 6# DB hanging by YTB. 2x60 seconds with each hand. Exercise to improve posture and postural strength/endurance while performing functional activity.   Manual therapy: to reduce pain and tissue tension, improve range of motion, neuromodulation, in order to promote improved ability to complete functional activities. SEATED - Cervical spine retraction with clinician overpressure, 2x6 (Improved asterisk sign: contralateral pain with left rotation, discontinued after 2nd set due to report of dizziness).   Pt required multimodal cuing for proper technique and to facilitate improved neuromuscular control, strength, range of motion, and functional ability resulting in improved performance and form.   PATIENT EDUCATION:  Education details: Exercise purpose/form. Self management techniques.  Education on diagnosis, prognosis, POC, anatomy and physiology of current condition. Education on HEP including handout. Person educated: Patient Education method: Explanation, Demonstration, and Handouts Education comprehension: verbalized understanding and needs further education  HOME EXERCISE PROGRAM: Access Code: [facility] URL: [email or web address] Date: 04/14/2023 Prepared by: Norton Blizzard  Exercises - Seated Cervical Rotation AROM  - 2 x daily - 2 sets - 10 reps - standing cervical flexion/extension AROM  - 2 x daily - 2 sets - 10 reps - Supine Shoulder Horizontal Abduction with Resistance  - 3 x weekly - 3 sets - 20 reps - 30 seconds rest - 2 seconds each way tempo - blue band color - Seated Shoulder Diagonal with Resistance  - 3 x weekly - 3 sets - 20 reps - 2 seconds each way tempo - 30 seconds rest - red band color  HOME EXERCISE PROGRAM [[email or web address]] View at [email or web address]" using code: [email or web address] Cervical Retraction in Sitting w/OP - MDT -  Repeat 10 Repetitions, Hold 1 Second(s), Complete 1 Set, Perform 5 Times a Day  ASSESSMENT:  CLINICAL IMPRESSION: Patient arrives reporting soreness in her quads and upper traps consistent with DOMS and right sided pain in her neck with left rotation. Neck pain was improved by cervical retraction with clinician overpressure,  but it was not continued due to dizziness that resolved after discontinuing clinician overpressure. Patient did not have any dizziness with self overpressure, so the exercise was added to HEP. She also continued with functional strengthening exercises for posture and neck strength/endurance. She was able to increase her exercise volume with squats. Patient would benefit from continued management of limiting condition by skilled physical therapist to address remaining impairments and functional limitations to work towards stated goals and return to PLOF or maximal functional independence.   From Initial  Evaluation: Patient is a 77 y.o. female referred to outpatient physical therapy with a medical diagnosis of cervical radiculopathy, cervicalgia who presents with signs and symptoms consistent with chronic neck pain and stiffness. Patient demonstrates deficits in cervical spine ROM and posture that are likely contributing to her neck pain. She does not appear to have radiculopathy but does have right shoulder dysfunction with RTSA revision that likely contributes to her neck pain.Patient presents with significant pain, ROM, joint stiffness, muscle performance (strength/power/endurance), balance, muscle tension, motor control, and activity tolerance impairments that are limiting ability to complete her usual activities including difficulty with water aerobics, turing to look at things, enjoying nature, sleeping without difficulty. Patient will benefit from skilled physical therapy intervention to address current body structure impairments and activity limitations to improve function and work towards goals set in current POC in order to return to prior level of function or maximal functional improvement.    OBJECTIVE IMPAIRMENTS: decreased activity tolerance, decreased balance, decreased coordination, decreased endurance, decreased knowledge of condition, decreased mobility, decreased ROM, decreased strength, hypomobility, impaired perceived functional ability, increased muscle spasms, impaired flexibility, impaired UE functional use, improper body mechanics, postural dysfunction, obesity, and pain.   ACTIVITY LIMITATIONS: sleeping and moving head, looking at things  PARTICIPATION LIMITATIONS: interpersonal relationship, community activity, and enjoying nature, participating in water aerobics  PERSONAL FACTORS: Age, Past/current experiences, Time since onset of injury/illness/exacerbation, and 3+ comorbidities:   Status post reverse total shoulder replacement, right; Chronic venous insufficiency; Lymphedema;  Diabetes (HCC); Hyperlipidemia; Essential hypertension; Closed fracture of neck of right scapula; Rotator cuff arthropathy, right; DJD (degenerative joint disease) of cervical spine; Pelvic pressure in female; Cystocele with rectocele; Mixed urinary incontinence due to female genital prolapse; Submucous leiomyoma of uterus; Thickened endometrium; Anxiety, generalized; History of depression; Memory difficulty; S/P reverse total shoulder arthroplasty, right; Hx of sleep apnea; Anemia; and Hemochromatosis carrier on their problem list.  has a past medical history of Anemia, Arthritis, Bipolar affect, depressed (HCC), COPD (chronic obstructive pulmonary disease) (HCC), Diabetes mellitus without complication (HCC), GERD (gastroesophageal reflux disease), Hyperlipidemia, Hypertension, Hypothyroidism, PTSD (post-traumatic stress disorder), Schizoaffective disorder (HCC), Sleep apnea, Stroke (HCC), TIA (transient ischemic attack) (2010), and UTI (urinary tract infection).  has a past surgical history that includes Shoulder surgery (Right, 2020); Cholecystectomy; Breast surgery (Right); Joint replacement; Replacement total knee bilateral (Bilateral); Eye surgery; Cataract extraction w/ intraocular lens  implant, bilateral (Bilateral); Bilateral carpal tunnel release (Bilateral); Colonoscopy; Esophagogastroduodenoscopy; Nasal sinus surgery; Reverse shoulder arthroplasty (Right, 09/02/2020); Breast biopsy; and Total shoulder revision (Right, 10/23/2021) are also affecting patient's functional outcome.   REHAB POTENTIAL: Fair due to chronicity of problem, comorbid conditions  CLINICAL DECISION MAKING: Evolving/moderate complexity  EVALUATION COMPLEXITY: Moderate   GOALS: Goals reviewed with patient? No  SHORT TERM GOALS: Target date: 04/07/2023  Patient will be independent with initial home exercise program for self-management of symptoms. Baseline: Initial HEP provided at IE (03/24/23); Goal status:  In-progress   LONG TERM GOALS: Target date: 06/16/2023  Patient will be independent with a long-term home exercise program for self-management of symptoms.  Baseline: Initial HEP provided at IE (03/24/23); Goal status: In-progress  2.  Patient will demonstrate improved FOTO by equal or greater than 10 points by visit #10 to demonstrate improvement in overall condition and self-reported functional ability.  Baseline: to be measured at visit 2 as appropriate - tech malfunction at IE (03/24/23); 39 at visit #2 (04/14/2023);  Goal status: In-progress  3.  Patient will demonstrate the ability to rotate cervical spine equal or greater than 60 degrees each direction to improve her ability to view her surroundings and enjoy nature.  Baseline: R/L 43/30 (03/24/23); Goal status: In-progress  4.  Patient will score equal or greater than 39 seconds on the Deep Neck Flexor Endurance Test (DNFET) to match norms for pain free individuals in her age group (norm +1 standard deviation) and improve her ability to enjoy nature and participate in water aerobics.  Baseline: to be tested visit 2 as appropriate (03/24/23); 90 seconds (04/13/2023);  Goal status: MET  5.  Patient will demonstrate improvement in Patient Specific Functional Scale (PSFS) of equal or greater than 3 points to reflect clinically significant improvement in patient's most valued functional activities. Baseline: to be measured visit 2 as appropriate (03/24/23); 3.7 seconds (04/14/2023);  Goal status: In-progress  6.  Patient will report decrease in pain during functional activities to equal or less than 3/10 to improve her ability to sleep, view her surroundings, and participate in water aerobics.  Baseline: to be measured visit 2 as appropriate (03/24/23); Goal status: In-progress    PLAN:  PT FREQUENCY: 1-2x/week  PT DURATION: 8-12 weeks  PLANNED INTERVENTIONS: 97164- PT Re-evaluation, 97110-Therapeutic exercises, 97530- Therapeutic  activity, [id number]- Neuromuscular re-education, 97140- Manual therapy, [id number]- Aquatic Therapy, 97014- Electrical stimulation (unattended), [phone]- Electrical stimulation (manual), Patient/Family education, Balance training, Dry Needling, Joint mobilization, Spinal mobilization, Cryotherapy, and Moist heat  PLAN FOR NEXT SESSION: update HEP as appropriate, address joint restrictions and soft tissue tension/shortness, progressive postural/UE/functional strengthening and ROM exercises. Education, manual therapy and dry needling as needed.    Luretha Murphy. Ilsa Iha, PT, DPT 04/21/23, 6:51 PM  [facility] [address] [place], [place] [address] P: [phone] I F: [phone]

## 2023-04-22 ENCOUNTER — Ambulatory Visit
Admission: RE | Admit: 2023-04-22 | Discharge: 2023-04-22 | Disposition: A | Discharge status: Home / Self Care | Payer: 59 | Source: Ambulatory Visit | Attending: Infectious Diseases | Admitting: Infectious Diseases

## 2023-04-22 DIAGNOSIS — Z1231 Encounter for screening mammogram for malignant neoplasm of breast: Secondary | ICD-10-CM | POA: Diagnosis not present

## 2023-04-25 ENCOUNTER — Ambulatory Visit (INDEPENDENT_AMBULATORY_CARE_PROVIDER_SITE_OTHER): Payer: 59 | Admitting: Licensed Clinical Social Worker

## 2023-04-25 DIAGNOSIS — Z91199 Patient's noncompliance with other medical treatment and regimen due to unspecified reason: Secondary | ICD-10-CM

## 2023-04-25 NOTE — Progress Notes (Signed)
Clinician attempted session via face-to-face, but Angela Fernandez did not appear for her session. Cln. called pt. and she reports she fell out of bed and hit her head and is in too much pain to attend her appointment. Pt reports she is in the process of finding transportation to be checked out at the hospital. Reminded pt. of her next appointment.

## 2023-04-26 ENCOUNTER — Ambulatory Visit: Payer: 59 | Admitting: Physical Therapy

## 2023-04-26 ENCOUNTER — Encounter: Payer: Self-pay | Admitting: Physical Therapy

## 2023-04-26 DIAGNOSIS — R293 Abnormal posture: Secondary | ICD-10-CM

## 2023-04-26 DIAGNOSIS — M542 Cervicalgia: Secondary | ICD-10-CM | POA: Diagnosis not present

## 2023-04-26 DIAGNOSIS — M6281 Muscle weakness (generalized): Secondary | ICD-10-CM | POA: Diagnosis not present

## 2023-04-26 NOTE — Therapy (Signed)
OUTPATIENT PHYSICAL THERAPY TREATMENT   Patient Name: Angela Fernandez MRN: [id number] DOB:May 05, 1946, 77 y.o., female Today's Date: 04/26/2023  END OF SESSION:  PT End of Session - 04/26/23 1040     Visit Number 5    Number of Visits 13    Date for PT Re-Evaluation 06/16/23    Authorization Type [employer] reporting period from 03/24/2023    Progress Note Due on Visit 10    PT Start Time 1035    PT Stop Time 1125    PT Time Calculation (min) 50 min    Activity Tolerance Patient tolerated treatment well    Behavior During Therapy WFL for tasks assessed/performed               PCP: Mick Sell, MD  REFERRING PROVIDER: Elijah Birk, MD  REFERRING DIAG: cervical radiculopathy, cervicalgia  THERAPY DIAG:  Cervicalgia  Abnormal posture  Muscle weakness (generalized)  Rationale for Evaluation and Treatment: Rehabilitation  ONSET DATE: 10/23/2021  PERTINENT HISTORY:  Patient is a 77 y.o. female who presents to outpatient physical therapy with a referral for medical diagnosis cervical radiculopathy, cervicalgia. This patient's chief complaints consist of neck pain and stiffness, leading to the following functional deficits: difficulty with water aerobics, turing to look at things, enjoying nature, sleeping. Relevant past medical history and comorbidities include has Status post reverse total shoulder replacement, right; Chronic venous insufficiency; Lymphedema; Diabetes (HCC); Hyperlipidemia; Essential hypertension; Closed fracture of neck of right scapula; Rotator cuff arthropathy, right; DJD (degenerative joint disease) of cervical spine; Pelvic pressure in female; Cystocele with rectocele; Mixed urinary incontinence due to female genital prolapse; Submucous leiomyoma of uterus; Thickened endometrium; Anxiety, generalized; History of depression; Memory difficulty; S/P reverse total shoulder arthroplasty, right; Hx of sleep apnea; Anemia; and  Hemochromatosis carrier on their problem list.  has a past medical history of Anemia, Arthritis, Bipolar affect, depressed (HCC), COPD (chronic obstructive pulmonary disease) (HCC), Diabetes mellitus without complication (HCC), GERD (gastroesophageal reflux disease), Hyperlipidemia, Hypertension, Hypothyroidism, PTSD (post-traumatic stress disorder), Schizoaffective disorder (HCC), Sleep apnea, Stroke (HCC), TIA (transient ischemic attack) (2010), and UTI (urinary tract infection).  has a past surgical history that includes Shoulder surgery (Right, 2020); Cholecystectomy; Breast surgery (Right); Joint replacement; Replacement total knee bilateral (Bilateral); Eye surgery; Cataract extraction w/ intraocular lens  implant, bilateral (Bilateral); Bilateral carpal tunnel release (Bilateral); Colonoscopy; Esophagogastroduodenoscopy; Nasal sinus surgery; Reverse shoulder arthroplasty (Right, 09/02/2020); Breast biopsy; and Total shoulder revision (Right, 10/23/2021).  Patient denies hx of cancer, seizures, heart problems, unexplained weight loss, unexplained changes in bowel or bladder problems, osteoporosis, and spinal surgery  SUBJECTIVE:  SUBJECTIVE STATEMENT: Patient states she was sore in her quads for a couple of days after last PT session. She states she found out she cannot really walk very far anymore because her legs "slow down" and she cannot get the energy to walk faster and cannot breathe. She states she has COPD and they are sending her to a pulmonary guy this or next week to see why she is running out of breath so much. Yesteray morning she fell out of bed while dreaming and hit her head and face. She hit the nightstand and a metal table. She states it woke her up but she does not think she lost  consciousness. She was able to get up with much effort. The only thing that hurts more now is her low back when she walks.   PAIN: NPRS: 5/10 in the right neck only when moving her head, low back up to 7/10 when she walks and moves.   PRECAUTIONS: Fall, no lifting heavy things with right UE (maybe 5-6# unsure, definitely not big bags like 30#)   PATIENT GOALS: "to be able to strengthening my upper arm and get my strength in that where I can hold it up for a while and get that pain under control"   OBJECTIVE  SELF-REPORTED FUNCTION FOTO score: 46/100 (neck questionnaire)  TODAY'S TREATMENT:   Therapeutic exercise: to centralize symptoms and improve ROM, strength, muscular endurance, and activity tolerance required for successful completion of functional activities. -  NuStep level 5 using bilateral upper and lower extremities. Seat/handle setting 9/9. For improved extremity mobility, muscular endurance, and activity tolerance; and to induce the analgesic effect of aerobic exercise, stimulate improved joint nutrition, and prepare body structures and systems for following interventions. x 6  minutes. Average SPM = 61. - seated cervical retraction AROM with self overpressure, 1x10 - cervical spine AROM rotation and flexion/extension after manual and retraction interventions to assess response/effectiveness of interventions.    Therapeutic activities: dynamic activities for functional strengthening and improved functional activity tolerance. - squats with 8.8 lb bar (from sled) in back squat position, using straps to hands to hold it in place. Squat touch or as low as possible to touch 17 inch chair, 3x15 approx 1 min rest between sets. Exercise to improve postural strength/endurance in functional position and improve scapular retraction. - single arm carry while walking around room, 6# DB hanging by YTB. 3x60 seconds with each hand. Exercise to improve posture and postural strength/endurance while  performing functional activity. 1 min seated break after 3 min of continuous walking due to foot pain.   Manual therapy: to reduce pain and tissue tension, improve range of motion, neuromodulation, in order to promote improved ability to complete functional activities. SUPINE - STM to posterior cervical spine musculature, focusing on tender/tight spots at bilateral UT and suboccipital muscles (reproduction of pain especially at R suboccipitals).   Pt required multimodal cuing for proper technique and to facilitate improved neuromuscular control, strength, range of motion, and functional ability resulting in improved performance and form.   PATIENT EDUCATION:  Education details: Exercise purpose/form. Self management techniques. Education on diagnosis, prognosis, POC, anatomy and physiology of current condition. Education on HEP including handout. Person educated: Patient Education method: Explanation, Demonstration, and Handouts Education comprehension: verbalized understanding and needs further education  HOME EXERCISE PROGRAM: Access Code: [facility] URL: [email or web address] Date: 04/14/2023 Prepared by: Norton Blizzard  Exercises - Seated Cervical Rotation AROM  - 2 x daily - 2 sets - 10 reps - standing  cervical flexion/extension AROM  - 2 x daily - 2 sets - 10 reps - Supine Shoulder Horizontal Abduction with Resistance  - 3 x weekly - 3 sets - 20 reps - 30 seconds rest - 2 seconds each way tempo - blue band color - Seated Shoulder Diagonal with Resistance  - 3 x weekly - 3 sets - 20 reps - 2 seconds each way tempo - 30 seconds rest - red band color  HOME EXERCISE PROGRAM [[email or web address]] View at [email or web address]" using code: [email or web address] Cervical Retraction in Sitting w/OP - MDT -  Repeat 10 Repetitions, Hold 1 Second(s), Complete 1 Set, Perform 5 Times a Day  ASSESSMENT:  CLINICAL IMPRESSION: Patient arrives with some marks on her nose and a light bruise on her left forehead  after falling out of bed yesterday. She did not show any sign of serious injury and was able to complete all exercises with tolerable discomfort. She is showing improvements in her postural awareness and muscular endurance while completing squats and carries. She was limited in carries due to foot pain. She also had pronounced shortness of breath with carries and squats but SpO2 was 95% following and she recovered well with sufficient rest. She reported significant pain/tension relief in her neck after manual therapy and concordant pain with pressure to right suboccipital region, suggesting she may benefit from further manual therapy there and/or dry needling. She demonstrates trend towards improvement in FOTO score for the neck (from 39 to 46) when it was measured today. Patient would benefit from continued management of limiting condition by skilled physical therapist to address remaining impairments and functional limitations to work towards stated goals and return to PLOF or maximal functional independence.   From Initial Evaluation: Patient is a 77 y.o. female referred to outpatient physical therapy with a medical diagnosis of cervical radiculopathy, cervicalgia who presents with signs and symptoms consistent with chronic neck pain and stiffness. Patient demonstrates deficits in cervical spine ROM and posture that are likely contributing to her neck pain. She does not appear to have radiculopathy but does have right shoulder dysfunction with RTSA revision that likely contributes to her neck pain.Patient presents with significant pain, ROM, joint stiffness, muscle performance (strength/power/endurance), balance, muscle tension, motor control, and activity tolerance impairments that are limiting ability to complete her usual activities including difficulty with water aerobics, turing to look at things, enjoying nature, sleeping without difficulty. Patient will benefit from skilled physical therapy intervention  to address current body structure impairments and activity limitations to improve function and work towards goals set in current POC in order to return to prior level of function or maximal functional improvement.    OBJECTIVE IMPAIRMENTS: decreased activity tolerance, decreased balance, decreased coordination, decreased endurance, decreased knowledge of condition, decreased mobility, decreased ROM, decreased strength, hypomobility, impaired perceived functional ability, increased muscle spasms, impaired flexibility, impaired UE functional use, improper body mechanics, postural dysfunction, obesity, and pain.   ACTIVITY LIMITATIONS: sleeping and moving head, looking at things  PARTICIPATION LIMITATIONS: interpersonal relationship, community activity, and enjoying nature, participating in water aerobics  PERSONAL FACTORS: Age, Past/current experiences, Time since onset of injury/illness/exacerbation, and 3+ comorbidities:   Status post reverse total shoulder replacement, right; Chronic venous insufficiency; Lymphedema; Diabetes (HCC); Hyperlipidemia; Essential hypertension; Closed fracture of neck of right scapula; Rotator cuff arthropathy, right; DJD (degenerative joint disease) of cervical spine; Pelvic pressure in female; Cystocele with rectocele; Mixed urinary incontinence due to female genital prolapse; Submucous leiomyoma of uterus; Thickened endometrium; Anxiety, generalized; History of depression;  Memory difficulty; S/P reverse total shoulder arthroplasty, right; Hx of sleep apnea; Anemia; and Hemochromatosis carrier on their problem list.  has a past medical history of Anemia, Arthritis, Bipolar affect, depressed (HCC), COPD (chronic obstructive pulmonary disease) (HCC), Diabetes mellitus without complication (HCC), GERD (gastroesophageal reflux disease), Hyperlipidemia, Hypertension, Hypothyroidism, PTSD (post-traumatic stress disorder), Schizoaffective disorder (HCC), Sleep apnea, Stroke (HCC),  TIA (transient ischemic attack) (2010), and UTI (urinary tract infection).  has a past surgical history that includes Shoulder surgery (Right, 2020); Cholecystectomy; Breast surgery (Right); Joint replacement; Replacement total knee bilateral (Bilateral); Eye surgery; Cataract extraction w/ intraocular lens  implant, bilateral (Bilateral); Bilateral carpal tunnel release (Bilateral); Colonoscopy; Esophagogastroduodenoscopy; Nasal sinus surgery; Reverse shoulder arthroplasty (Right, 09/02/2020); Breast biopsy; and Total shoulder revision (Right, 10/23/2021) are also affecting patient's functional outcome.   REHAB POTENTIAL: Fair due to chronicity of problem, comorbid conditions  CLINICAL DECISION MAKING: Evolving/moderate complexity  EVALUATION COMPLEXITY: Moderate   GOALS: Goals reviewed with patient? No  SHORT TERM GOALS: Target date: 04/07/2023  Patient will be independent with initial home exercise program for self-management of symptoms. Baseline: Initial HEP provided at IE (03/24/23); Goal status: In-progress   LONG TERM GOALS: Target date: 06/16/2023  Patient will be independent with a long-term home exercise program for self-management of symptoms.  Baseline: Initial HEP provided at IE (03/24/23); Goal status: In-progress  2.  Patient will demonstrate improved FOTO by equal or greater than 10 points by visit #10 to demonstrate improvement in overall condition and self-reported functional ability.  Baseline: to be measured at visit 2 as appropriate - tech malfunction at IE (03/24/23); 39 at visit #2 (04/14/2023); 46 at visit #5 (04/26/2023);  Goal status: In-progress  3.  Patient will demonstrate the ability to rotate cervical spine equal or greater than 60 degrees each direction to improve her ability to view her surroundings and enjoy nature.  Baseline: R/L 43/30 (03/24/23); Goal status: In-progress  4.  Patient will score equal or greater than 39 seconds on the Deep Neck Flexor  Endurance Test (DNFET) to match norms for pain free individuals in her age group (norm +1 standard deviation) and improve her ability to enjoy nature and participate in water aerobics.  Baseline: to be tested visit 2 as appropriate (03/24/23); 90 seconds (04/13/2023);  Goal status: MET  5.  Patient will demonstrate improvement in Patient Specific Functional Scale (PSFS) of equal or greater than 3 points to reflect clinically significant improvement in patient's most valued functional activities. Baseline: to be measured visit 2 as appropriate (03/24/23); 3.7 seconds (04/14/2023);  Goal status: In-progress  6.  Patient will report decrease in pain during functional activities to equal or less than 3/10 to improve her ability to sleep, view her surroundings, and participate in water aerobics.  Baseline: to be measured visit 2 as appropriate (03/24/23); Goal status: In-progress    PLAN:  PT FREQUENCY: 1-2x/week  PT DURATION: 8-12 weeks  PLANNED INTERVENTIONS: 97164- PT Re-evaluation, 97110-Therapeutic exercises, 97530- Therapeutic activity, [id number]- Neuromuscular re-education, 97140- Manual therapy, [id number]- Aquatic Therapy, 97014- Electrical stimulation (unattended), [id number]- Electrical stimulation (manual), Patient/Family education, Balance training, Dry Needling, Joint mobilization, Spinal mobilization, Cryotherapy, and Moist heat  PLAN FOR NEXT SESSION: update HEP as appropriate, address joint restrictions and soft tissue tension/shortness, progressive postural/UE/functional strengthening and ROM exercises. Education, manual therapy and dry needling as needed.    Luretha Murphy. Ilsa Iha, PT, DPT 04/26/23, 12:05 PM  [facility] [address] [place], [place] [address] P: [phone] I F:  [phone]       

## 2023-04-27 DIAGNOSIS — Q66221 Congenital metatarsus adductus, right foot: Secondary | ICD-10-CM | POA: Diagnosis not present

## 2023-04-27 DIAGNOSIS — M2041 Other hammer toe(s) (acquired), right foot: Secondary | ICD-10-CM | POA: Diagnosis not present

## 2023-04-27 DIAGNOSIS — E119 Type 2 diabetes mellitus without complications: Secondary | ICD-10-CM | POA: Diagnosis not present

## 2023-04-27 DIAGNOSIS — M19072 Primary osteoarthritis, left ankle and foot: Secondary | ICD-10-CM | POA: Diagnosis not present

## 2023-04-27 DIAGNOSIS — M792 Neuralgia and neuritis, unspecified: Secondary | ICD-10-CM | POA: Diagnosis not present

## 2023-04-27 DIAGNOSIS — Q66222 Congenital metatarsus adductus, left foot: Secondary | ICD-10-CM | POA: Diagnosis not present

## 2023-04-27 DIAGNOSIS — M19071 Primary osteoarthritis, right ankle and foot: Secondary | ICD-10-CM | POA: Diagnosis not present

## 2023-04-27 DIAGNOSIS — M2042 Other hammer toe(s) (acquired), left foot: Secondary | ICD-10-CM | POA: Diagnosis not present

## 2023-04-28 ENCOUNTER — Encounter: Payer: Self-pay | Admitting: Physical Therapy

## 2023-04-28 ENCOUNTER — Ambulatory Visit: Payer: 59 | Admitting: Physical Therapy

## 2023-04-28 DIAGNOSIS — M542 Cervicalgia: Secondary | ICD-10-CM | POA: Diagnosis not present

## 2023-04-28 DIAGNOSIS — R293 Abnormal posture: Secondary | ICD-10-CM

## 2023-04-28 DIAGNOSIS — M6281 Muscle weakness (generalized): Secondary | ICD-10-CM | POA: Diagnosis not present

## 2023-04-28 NOTE — Therapy (Signed)
OUTPATIENT PHYSICAL THERAPY TREATMENT   Patient Name: Angela Fernandez MRN: [id number] DOB:Jul 22, 1945, 77 y.o., female Today's Date: 04/28/2023  END OF SESSION:  PT End of Session - 04/28/23 1136     Visit Number 6    Number of Visits 13    Date for PT Re-Evaluation 06/16/23    Authorization Type [employer]IC[employer] reporting period from 03/24/2023    Progress Note Due on Visit 10    PT Start Time 1040    PT Stop Time 1120    PT Time Calculation (min) 40 min    Activity Tolerance Patient tolerated treatment well    Behavior During Therapy WFL for tasks assessed/performed             PCP: Mick Sell, MD  REFERRING PROVIDER: Elijah Birk, MD  REFERRING DIAG: cervical radiculopathy, cervicalgia  THERAPY DIAG:  Cervicalgia  Abnormal posture  Muscle weakness (generalized)  Rationale for Evaluation and Treatment: Rehabilitation  ONSET DATE: 10/23/2021  PERTINENT HISTORY:  Patient is a 77 y.o. female who presents to outpatient physical therapy with a referral for medical diagnosis cervical radiculopathy, cervicalgia. This patient's chief complaints consist of neck pain and stiffness, leading to the following functional deficits: difficulty with water aerobics, turing to look at things, enjoying nature, sleeping. Relevant past medical history and comorbidities include has Status post reverse total shoulder replacement, right; Chronic venous insufficiency; Lymphedema; Diabetes (HCC); Hyperlipidemia; Essential hypertension; Closed fracture of neck of right scapula; Rotator cuff arthropathy, right; DJD (degenerative joint disease) of cervical spine; Pelvic pressure in female; Cystocele with rectocele; Mixed urinary incontinence due to female genital prolapse; Submucous leiomyoma of uterus; Thickened endometrium; Anxiety, generalized; History of depression; Memory difficulty; S/P reverse total shoulder arthroplasty, right; Hx of sleep apnea; Anemia; and  Hemochromatosis carrier on their problem list.  has a past medical history of Anemia, Arthritis, Bipolar affect, depressed (HCC), COPD (chronic obstructive pulmonary disease) (HCC), Diabetes mellitus without complication (HCC), GERD (gastroesophageal reflux disease), Hyperlipidemia, Hypertension, Hypothyroidism, PTSD (post-traumatic stress disorder), Schizoaffective disorder (HCC), Sleep apnea, Stroke (HCC), TIA (transient ischemic attack) (2010), and UTI (urinary tract infection).  has a past surgical history that includes Shoulder surgery (Right, 2020); Cholecystectomy; Breast surgery (Right); Joint replacement; Replacement total knee bilateral (Bilateral); Eye surgery; Cataract extraction w/ intraocular lens  implant, bilateral (Bilateral); Bilateral carpal tunnel release (Bilateral); Colonoscopy; Esophagogastroduodenoscopy; Nasal sinus surgery; Reverse shoulder arthroplasty (Right, 09/02/2020); Breast biopsy; and Total shoulder revision (Right, 10/23/2021).  Patient denies hx of cancer, seizures, heart problems, unexplained weight loss, unexplained changes in bowel or bladder problems, osteoporosis, and spinal surgery  SUBJECTIVE:  SUBJECTIVE STATEMENT: Patient states she got some shoe inserts from her podiatrist that are helping her foot pain. He also gave her some compounded cream for them. She states he said that was all he could do besides surgery. Her neck felt better after last PT session and this lasted pretty much all day.   PAIN: NPRS: 4/10 both sides of the base of her neck when she moves it.    PRECAUTIONS: Fall, no lifting heavy things with right UE (maybe 5-6# unsure, definitely not big bags like 30#)   PATIENT GOALS: "to be able to strengthening my upper arm and get my strength in that where  I can hold it up for a while and get that pain under control"   OBJECTIVE  TODAY'S TREATMENT:   Therapeutic exercise: to centralize symptoms and improve ROM, strength, muscular endurance, and activity tolerance required for successful completion of functional activities. -  NuStep level 6 using bilateral upper and lower extremities. Seat/handle setting 9/9. For improved extremity mobility, muscular endurance, and activity tolerance; and to induce the analgesic effect of aerobic exercise, stimulate improved joint nutrition, and prepare body structures and systems for following interventions. x 6  minutes. Average SPM = 62. - seated cervical retraction AROM with self overpressure, 1x20, 1x10 - cervical spine AROM rotation and flexion/extension after manual/dry-needling and retraction interventions to assess response/effectiveness of interventions.    Therapeutic activities: dynamic activities for functional strengthening and improved functional activity tolerance. - squats with 8.8 lb bar (from sled) in back squat position, using straps to hands to hold it in place. Squat touch or as low as possible to touch 17 inch chair, 3x15 approx 1 min rest between sets. Exercise to improve postural strength/endurance in functional position and improve scapular retraction. - single arm carry while walking around room, 6# DB hanging by YTB. 1x60 seconds with each hand. Exercise to improve posture and postural strength/endurance while performing functional activity.    Manual therapy: to reduce pain and tissue tension, improve range of motion, neuromodulation, in order to promote improved ability to complete functional activities. PRONE - STM to bilateral UT  Modality: Dry needling performed to bilateral UT to decrease pain and spasms along patient's neck region with patient in prone utilizing 2 dry needle(s) .25mm x 40mm with 2-3 sticks at each upper trap . Patient educated about the risks and benefits from  therapy and verbally consents to treatment.  Dry needling performed by Luretha Murphy. Ilsa Iha PT, DPT who is certified in this technique.   Pt required multimodal cuing for proper technique and to facilitate improved neuromuscular control, strength, range of motion, and functional ability resulting in improved performance and form.   PATIENT EDUCATION:  Education details: Exercise purpose/form. Self management techniques. Education on diagnosis, prognosis, POC, anatomy and physiology of current condition. Education on HEP including handout. Person educated: Patient Education method: Explanation, Demonstration, and Handouts Education comprehension: verbalized understanding and needs further education  HOME EXERCISE PROGRAM: Access Code: [facility] URL: [email or web address] Date: 04/14/2023 Prepared by: Norton Blizzard  Exercises - Seated Cervical Rotation AROM  - 2 x daily - 2 sets - 10 reps - standing cervical flexion/extension AROM  - 2 x daily - 2 sets - 10 reps - Supine Shoulder Horizontal Abduction with Resistance  - 3 x weekly - 3 sets - 20 reps - 30 seconds rest - 2 seconds each way tempo - blue band color - Seated Shoulder Diagonal with Resistance  - 3 x weekly - 3 sets - 20  reps - 2 seconds each way tempo - 30 seconds rest - red band color  HOME EXERCISE PROGRAM [[email or web address]] View at [email or web address]" using code: [email or web address] Cervical Retraction in Sitting w/OP - MDT -  Repeat 10 Repetitions, Hold 1 Second(s), Complete 1 Set, Perform 5 Times a Day  ASSESSMENT:  CLINICAL IMPRESSION: Patient arrives with mild carry over in improvement of neck pain after last PT session. Continued working on conditioning exercises for posture and neck with good tolerance. Patient also continues to get mild relief from cervical retraction. Introduced dry needling with good response at left upper trap (no pain there afterwards). The right upper trap had more tight bands and was more uncomfortable  during needling. Patient reported improvement at right upper trap but still felt pulling. Patinet may benefit from dry needling at suboccipital region based on where she felt concordant pain lats PT visit. Plan to continue with strengthening/conditioning exercises and manual therapy / dry needling for pain relief and improved function next session. Consider stretches specifically targeting UT an suboccipitals next session. Patient would benefit from continued management of limiting condition by skilled physical therapist to address remaining impairments and functional limitations to work towards stated goals and return to PLOF or maximal functional independence.   From Initial Evaluation: Patient is a 77 y.o. female referred to outpatient physical therapy with a medical diagnosis of cervical radiculopathy, cervicalgia who presents with signs and symptoms consistent with chronic neck pain and stiffness. Patient demonstrates deficits in cervical spine ROM and posture that are likely contributing to her neck pain. She does not appear to have radiculopathy but does have right shoulder dysfunction with RTSA revision that likely contributes to her neck pain.Patient presents with significant pain, ROM, joint stiffness, muscle performance (strength/power/endurance), balance, muscle tension, motor control, and activity tolerance impairments that are limiting ability to complete her usual activities including difficulty with water aerobics, turing to look at things, enjoying nature, sleeping without difficulty. Patient will benefit from skilled physical therapy intervention to address current body structure impairments and activity limitations to improve function and work towards goals set in current POC in order to return to prior level of function or maximal functional improvement.    OBJECTIVE IMPAIRMENTS: decreased activity tolerance, decreased balance, decreased coordination, decreased endurance, decreased  knowledge of condition, decreased mobility, decreased ROM, decreased strength, hypomobility, impaired perceived functional ability, increased muscle spasms, impaired flexibility, impaired UE functional use, improper body mechanics, postural dysfunction, obesity, and pain.   ACTIVITY LIMITATIONS: sleeping and moving head, looking at things  PARTICIPATION LIMITATIONS: interpersonal relationship, community activity, and enjoying nature, participating in water aerobics  PERSONAL FACTORS: Age, Past/current experiences, Time since onset of injury/illness/exacerbation, and 3+ comorbidities:   Status post reverse total shoulder replacement, right; Chronic venous insufficiency; Lymphedema; Diabetes (HCC); Hyperlipidemia; Essential hypertension; Closed fracture of neck of right scapula; Rotator cuff arthropathy, right; DJD (degenerative joint disease) of cervical spine; Pelvic pressure in female; Cystocele with rectocele; Mixed urinary incontinence due to female genital prolapse; Submucous leiomyoma of uterus; Thickened endometrium; Anxiety, generalized; History of depression; Memory difficulty; S/P reverse total shoulder arthroplasty, right; Hx of sleep apnea; Anemia; and Hemochromatosis carrier on their problem list.  has a past medical history of Anemia, Arthritis, Bipolar affect, depressed (HCC), COPD (chronic obstructive pulmonary disease) (HCC), Diabetes mellitus without complication (HCC), GERD (gastroesophageal reflux disease), Hyperlipidemia, Hypertension, Hypothyroidism, PTSD (post-traumatic stress disorder), Schizoaffective disorder (HCC), Sleep apnea, Stroke (HCC), TIA (transient ischemic attack) (2010), and UTI (urinary tract infection).  has a past surgical history that  includes Shoulder surgery (Right, 2020); Cholecystectomy; Breast surgery (Right); Joint replacement; Replacement total knee bilateral (Bilateral); Eye surgery; Cataract extraction w/ intraocular lens  implant, bilateral (Bilateral);  Bilateral carpal tunnel release (Bilateral); Colonoscopy; Esophagogastroduodenoscopy; Nasal sinus surgery; Reverse shoulder arthroplasty (Right, 09/02/2020); Breast biopsy; and Total shoulder revision (Right, 10/23/2021) are also affecting patient's functional outcome.   REHAB POTENTIAL: Fair due to chronicity of problem, comorbid conditions  CLINICAL DECISION MAKING: Evolving/moderate complexity  EVALUATION COMPLEXITY: Moderate   GOALS: Goals reviewed with patient? No  SHORT TERM GOALS: Target date: 04/07/2023  Patient will be independent with initial home exercise program for self-management of symptoms. Baseline: Initial HEP provided at IE (03/24/23); Goal status: In-progress   LONG TERM GOALS: Target date: 06/16/2023  Patient will be independent with a long-term home exercise program for self-management of symptoms.  Baseline: Initial HEP provided at IE (03/24/23); Goal status: In-progress  2.  Patient will demonstrate improved FOTO by equal or greater than 10 points by visit #10 to demonstrate improvement in overall condition and self-reported functional ability.  Baseline: to be measured at visit 2 as appropriate - tech malfunction at IE (03/24/23); 39 at visit #2 (04/14/2023); 46 at visit #5 (04/26/2023);  Goal status: In-progress  3.  Patient will demonstrate the ability to rotate cervical spine equal or greater than 60 degrees each direction to improve her ability to view her surroundings and enjoy nature.  Baseline: R/L 43/30 (03/24/23); Goal status: In-progress  4.  Patient will score equal or greater than 39 seconds on the Deep Neck Flexor Endurance Test (DNFET) to match norms for pain free individuals in her age group (norm +1 standard deviation) and improve her ability to enjoy nature and participate in water aerobics.  Baseline: to be tested visit 2 as appropriate (03/24/23); 90 seconds (04/13/2023);  Goal status: MET  5.  Patient will demonstrate improvement in Patient  Specific Functional Scale (PSFS) of equal or greater than 3 points to reflect clinically significant improvement in patient's most valued functional activities. Baseline: to be measured visit 2 as appropriate (03/24/23); 3.7 seconds (04/14/2023);  Goal status: In-progress  6.  Patient will report decrease in pain during functional activities to equal or less than 3/10 to improve her ability to sleep, view her surroundings, and participate in water aerobics.  Baseline: to be measured visit 2 as appropriate (03/24/23); Goal status: In-progress    PLAN:  PT FREQUENCY: 1-2x/week  PT DURATION: 8-12 weeks  PLANNED INTERVENTIONS: 97164- PT Re-evaluation, 97110-Therapeutic exercises, 97530- Therapeutic activity, [id number]- Neuromuscular re-education, 97140- Manual therapy, [id number]- Aquatic Therapy, 97014- Electrical stimulation (unattended), [phone]- Electrical stimulation (manual), Patient/Family education, Balance training, Dry Needling, Joint mobilization, Spinal mobilization, Cryotherapy, and Moist heat  PLAN FOR NEXT SESSION: update HEP as appropriate, address joint restrictions and soft tissue tension/shortness, progressive postural/UE/functional strengthening and ROM exercises. Education, manual therapy and dry needling as needed.    Luretha Murphy. Ilsa Iha, PT, DPT 04/28/23, 11:42 AM  [facility] [address] [place], [place] [address] P: [phone] I F: [phone]

## 2023-05-02 ENCOUNTER — Ambulatory Visit: Payer: 59 | Admitting: Physical Therapy

## 2023-05-05 ENCOUNTER — Ambulatory Visit: Payer: 59 | Admitting: Physical Therapy

## 2023-05-07 DIAGNOSIS — E119 Type 2 diabetes mellitus without complications: Secondary | ICD-10-CM | POA: Diagnosis not present

## 2023-05-09 ENCOUNTER — Ambulatory Visit (INDEPENDENT_AMBULATORY_CARE_PROVIDER_SITE_OTHER): Payer: 59 | Admitting: Licensed Clinical Social Worker

## 2023-05-09 ENCOUNTER — Ambulatory Visit: Payer: 59 | Admitting: Physical Therapy

## 2023-05-09 DIAGNOSIS — F4310 Post-traumatic stress disorder, unspecified: Secondary | ICD-10-CM | POA: Diagnosis not present

## 2023-05-09 DIAGNOSIS — F259 Schizoaffective disorder, unspecified: Secondary | ICD-10-CM | POA: Diagnosis not present

## 2023-05-09 DIAGNOSIS — F411 Generalized anxiety disorder: Secondary | ICD-10-CM | POA: Diagnosis not present

## 2023-05-09 NOTE — Progress Notes (Signed)
   THERAPIST PROGRESS NOTE  Session Time: 10:03am-10:45am  Participation Level: Active  Behavioral Response: CasualAlertEuthymic  Type of Therapy: Individual Therapy  Treatment Goals addressed: STG: [place] will identify situations, thoughts, and feelings that trigger internal anger, and/or angry/aggressive actions as evidenced by self-report   Reduce overall frequency, intensity and duration of anxiety so that daily functioning is not impaired per pt self report 3 out of 5 sessions.  Recognize, accept and cope with feelings of depression per pt self report 3 out of 5 sessions   ProgressTowards Goals: Progressing  Interventions: CBT, Psychosocial Skills: Healthy Boundaries/Assertive Communication , Supportive, and Social Skills Training  Summary:Angela Fernandez is a 77 y.o. female who presents with symptoms of anxiety.  Patient reports uncontrollable worry, irritability, and racing thoughts.  Patient presents today feeling "spacey."  Pt was oriented times 5. Pt was cooperative and engaged. Pt denies SI/HI/AVH.       Patient utilized therapeutic space to process current physical wellbeing.  Patient reflected on recent episodes of sleepwalking where she fell and injured her head.  Patient identifies nightmares where she is running from something which often result in her waking jumping out of bed.  Reflected on recent holiday season and relationship with her daughter.  Patient was able to demonstrate improvements by reflecting on things she identified went well.  Patient expressed excitement about the opportunity to get back into water aerobics.  Reflected on patient's current self-care routine.  Patient continues to report she is finding time to paint and is working towards her goal of connecting with friends back in [place].  Discussed ways in which patient is fostering healthy friendships.  Discussed the pros and cons of reaching out to an old friend through a letter to discuss the dynamics of  their current relationship.  Patient reflected on feelings of regret from her previous marriage and her pattern to prioritize the needs of others due to her desire to seek out companionship.  Reflected on patient's history of relationships and discussed root of messages learned from her mother and how childhood impacted patients choice in partners.   Suicidal/Homicidal: Nowithout intent/plan  Therapist Response: Clinician utilized active and supportive reflection to create a safe environment for patient to process recent events.  Clinician assessed for current stressors, symptoms, safety since last session.  Clinician utilized CBT techniques to assist in reframing unhelpful thoughts about reaching out to those in her support system.  Addressed healthy boundaries and assertive communication.  Plan: Return again in 2 weeks.  Diagnosis: Schizoaffective disorder, unspecified type (HCC)  PTSD (post-traumatic stress disorder)  Anxiety state   Collaboration of Care: AEB psychiatrist can access notes and cln. Will review psychiatrists' notes. Check in with the patient and will see LCSW per availability. Patient agreed with treatment recommendations.   Patient/Guardian was advised Release of Information must be obtained prior to any record release in order to collaborate their care with an outside provider. Patient/Guardian was advised if they have not already done so to contact the registration department to sign all necessary forms in order for [place] to release information regarding their care.   Consent: Patient/Guardian gives verbal consent for treatment and assignment of benefits for services provided during this visit. Patient/Guardian expressed understanding and agreed to proceed.   Dereck Leep, LCSW 05/09/2023

## 2023-05-10 ENCOUNTER — Encounter: Payer: Self-pay | Admitting: Physical Therapy

## 2023-05-10 ENCOUNTER — Ambulatory Visit: Payer: 59 | Admitting: Physical Therapy

## 2023-05-10 DIAGNOSIS — M542 Cervicalgia: Secondary | ICD-10-CM

## 2023-05-10 DIAGNOSIS — R293 Abnormal posture: Secondary | ICD-10-CM | POA: Diagnosis not present

## 2023-05-10 DIAGNOSIS — M6281 Muscle weakness (generalized): Secondary | ICD-10-CM | POA: Diagnosis not present

## 2023-05-10 NOTE — Therapy (Signed)
 OUTPATIENT PHYSICAL THERAPY TREATMENT   Patient Name: Angela Fernandez MRN: [id number] DOB:05-29-1945, 77 y.o., female Today's Date: 05/10/2023  END OF SESSION:  PT End of Session - 05/10/23 1417     Visit Number 7    Number of Visits 13    Date for PT Re-Evaluation 06/16/23    Authorization Type [employer]IC[employer] reporting period from 03/24/2023    Progress Note Due on Visit 10    PT Start Time 1400   pt late   PT Stop Time 1425    PT Time Calculation (min) 25 min    Activity Tolerance Patient tolerated treatment well    Behavior During Therapy WFL for tasks assessed/performed              PCP: Epifanio Alm SQUIBB, MD  REFERRING PROVIDER: Dodson Delon FERNS, MD  REFERRING DIAG: cervical radiculopathy, cervicalgia  THERAPY DIAG:  Cervicalgia  Abnormal posture  Muscle weakness (generalized)  Rationale for Evaluation and Treatment: Rehabilitation  ONSET DATE: 10/23/2021  PERTINENT HISTORY:  Patient is a 77 y.o. female who presents to outpatient physical therapy with a referral for medical diagnosis cervical radiculopathy, cervicalgia. This patient's chief complaints consist of neck pain and stiffness, leading to the following functional deficits: difficulty with water aerobics, turing to look at things, enjoying nature, sleeping. Relevant past medical history and comorbidities include has Status post reverse total shoulder replacement, right; Chronic venous insufficiency; Lymphedema; Diabetes (HCC); Hyperlipidemia; Essential hypertension; Closed fracture of neck of right scapula; Rotator cuff arthropathy, right; DJD (degenerative joint disease) of cervical spine; Pelvic pressure in female; Cystocele with rectocele; Mixed urinary incontinence due to female genital prolapse; Submucous leiomyoma of uterus; Thickened endometrium; Anxiety, generalized; History of depression; Memory difficulty; S/P reverse total shoulder arthroplasty, right; Hx of sleep apnea; Anemia; and  Hemochromatosis carrier on their problem list.  has a past medical history of Anemia, Arthritis, Bipolar affect, depressed (HCC), COPD (chronic obstructive pulmonary disease) (HCC), Diabetes mellitus without complication (HCC), GERD (gastroesophageal reflux disease), Hyperlipidemia, Hypertension, Hypothyroidism, PTSD (post-traumatic stress disorder), Schizoaffective disorder (HCC), Sleep apnea, Stroke (HCC), TIA (transient ischemic attack) (2010), and UTI (urinary tract infection).  has a past surgical history that includes Shoulder surgery (Right, 2020); Cholecystectomy; Breast surgery (Right); Joint replacement; Replacement total knee bilateral (Bilateral); Eye surgery; Cataract extraction w/ intraocular lens  implant, bilateral (Bilateral); Bilateral carpal tunnel release (Bilateral); Colonoscopy; Esophagogastroduodenoscopy; Nasal sinus surgery; Reverse shoulder arthroplasty (Right, 09/02/2020); Breast biopsy; and Total shoulder revision (Right, 10/23/2021).  Patient denies hx of cancer, seizures, heart problems, unexplained weight loss, unexplained changes in bowel or bladder problems, osteoporosis, and spinal surgery  SUBJECTIVE:  SUBJECTIVE STATEMENT: Patient states her neck is feeing better. She was sick one day last week and fell out of bed and hit her head again. She has been taking prednisone  pills for her feet and just stopped yesterday. It helped her feet a lot and she wonders if it is what is making her neck feel good too. She can move her neck better with less pain. She states she felt worse for 2 days after dry needling last session, then it felt better for 2 days.   PAIN: NPRS: 3/10 left base of neck.    PRECAUTIONS: Fall, no lifting heavy things with right UE (maybe 5-6# unsure, definitely not  big bags like 30#)   PATIENT GOALS: to be able to strengthening my upper arm and get my strength in that where I can hold it up for a while and get that pain under control   OBJECTIVE  TODAY'S TREATMENT:   Therapeutic exercise: to centralize symptoms and improve ROM, strength, muscular endurance, and activity tolerance required for successful completion of functional activities. -  NuStep level 6 using bilateral upper and lower extremities. Seat/handle setting 9/9. For improved extremity mobility, muscular endurance, and activity tolerance; and to induce the analgesic effect of aerobic exercise, stimulate improved joint nutrition, and prepare body structures and systems for following interventions. x 5  minutes. Average SPM = 69. (Increased B UT pain)  - seated cervical retraction AROM with self overpressure, 1x20, (Neck feels better)  - cervical spine AROM rotation and flexion/extension after retraction/interventions to assess response/effectiveness of interventions.    Therapeutic activities: dynamic activities for functional strengthening and improved functional activity tolerance. - squats with 8.8 lb bar (from sled) in back squat position, using straps to hands to hold it in place. Squat touch or as low as possible to touch 17 inch chair, 3x15. Exercise to improve postural strength/endurance in functional position and improve scapular retraction. approx 1 min rest between sets.   - single arm carry while walking around room, 6# DB, 2x60 seconds with each hand. Exercise to improve posture and postural strength/endurance while performing functional activity.   1 min rest between sets  Pt required multimodal cuing for proper technique and to facilitate improved neuromuscular control, strength, range of motion, and functional ability resulting in improved performance and form.   PATIENT EDUCATION:  Education details: Exercise purpose/form. Self management techniques. Education on  diagnosis, prognosis, POC, anatomy and physiology of current condition. Education on HEP including handout. Person educated: Patient Education method: Explanation, Demonstration, and Handouts Education comprehension: verbalized understanding and needs further education  HOME EXERCISE PROGRAM: Access Code: [facility] URL: [email or web address] Date: 04/14/2023 Prepared by: Camie Cleverly  Exercises - Seated Cervical Rotation AROM  - 2 x daily - 2 sets - 10 reps - standing cervical flexion/extension AROM  - 2 x daily - 2 sets - 10 reps - Supine Shoulder Horizontal Abduction with Resistance  - 3 x weekly - 3 sets - 20 reps - 30 seconds rest - 2 seconds each way tempo - blue band color - Seated Shoulder Diagonal with Resistance  - 3 x weekly - 3 sets - 20 reps - 2 seconds each way tempo - 30 seconds rest - red band color  HOME EXERCISE PROGRAM [[email or web address]] View at [email or web address] using code: [email or web address] Cervical Retraction in Sitting w/OP - MDT -  Repeat 10 Repetitions, Hold 1 Second(s), Complete 1 Set, Perform 5 Times a Day  ASSESSMENT:  CLINICAL IMPRESSION: Patient arrives with improved comfort after taking  a prednisone dosepak since last PT session. She missed her last PT session due to illness. Today's session focused on returning to strengthening and neck exercises to improve her functional activity tolerance and strength and muscular endurance in the neck. She remains limited by R shoulder dysfunction s/p RTSA with revisions. Plan to continue with strengthening and consider dry needling/manual to the suboccipital region next session. Patient would benefit from continued management of limiting condition by skilled physical therapist to address remaining impairments and functional limitations to work towards stated goals and return to PLOF or maximal functional independence.   From Initial Evaluation: Patient is a 77 y.o. female referred to outpatient physical therapy with a  medical diagnosis of cervical radiculopathy, cervicalgia who presents with signs and symptoms consistent with chronic neck pain and stiffness. Patient demonstrates deficits in cervical spine ROM and posture that are likely contributing to her neck pain. She does not appear to have radiculopathy but does have right shoulder dysfunction with RTSA revision that likely contributes to her neck pain.Patient presents with significant pain, ROM, joint stiffness, muscle performance (strength/power/endurance), balance, muscle tension, motor control, and activity tolerance impairments that are limiting ability to complete her usual activities including difficulty with water aerobics, turing to look at things, enjoying nature, sleeping without difficulty. Patient will benefit from skilled physical therapy intervention to address current body structure impairments and activity limitations to improve function and work towards goals set in current POC in order to return to prior level of function or maximal functional improvement.    OBJECTIVE IMPAIRMENTS: decreased activity tolerance, decreased balance, decreased coordination, decreased endurance, decreased knowledge of condition, decreased mobility, decreased ROM, decreased strength, hypomobility, impaired perceived functional ability, increased muscle spasms, impaired flexibility, impaired UE functional use, improper body mechanics, postural dysfunction, obesity, and pain.   ACTIVITY LIMITATIONS: sleeping and moving head, looking at things  PARTICIPATION LIMITATIONS: interpersonal relationship, community activity, and enjoying nature, participating in water aerobics  PERSONAL FACTORS: Age, Past/current experiences, Time since onset of injury/illness/exacerbation, and 3+ comorbidities:   Status post reverse total shoulder replacement, right; Chronic venous insufficiency; Lymphedema; Diabetes (HCC); Hyperlipidemia; Essential hypertension; Closed fracture of neck of right  scapula; Rotator cuff arthropathy, right; DJD (degenerative joint disease) of cervical spine; Pelvic pressure in female; Cystocele with rectocele; Mixed urinary incontinence due to female genital prolapse; Submucous leiomyoma of uterus; Thickened endometrium; Anxiety, generalized; History of depression; Memory difficulty; S/P reverse total shoulder arthroplasty, right; Hx of sleep apnea; Anemia; and Hemochromatosis carrier on their problem list.  has a past medical history of Anemia, Arthritis, Bipolar affect, depressed (HCC), COPD (chronic obstructive pulmonary disease) (HCC), Diabetes mellitus without complication (HCC), GERD (gastroesophageal reflux disease), Hyperlipidemia, Hypertension, Hypothyroidism, PTSD (post-traumatic stress disorder), Schizoaffective disorder (HCC), Sleep apnea, Stroke (HCC), TIA (transient ischemic attack) (2010), and UTI (urinary tract infection).  has a past surgical history that includes Shoulder surgery (Right, 2020); Cholecystectomy; Breast surgery (Right); Joint replacement; Replacement total knee bilateral (Bilateral); Eye surgery; Cataract extraction w/ intraocular lens  implant, bilateral (Bilateral); Bilateral carpal tunnel release (Bilateral); Colonoscopy; Esophagogastroduodenoscopy; Nasal sinus surgery; Reverse shoulder arthroplasty (Right, 09/02/2020); Breast biopsy; and Total shoulder revision (Right, 10/23/2021) are also affecting patient's functional outcome.   REHAB POTENTIAL: Fair due to chronicity of problem, comorbid conditions  CLINICAL DECISION MAKING: Evolving/moderate complexity  EVALUATION COMPLEXITY: Moderate   GOALS: Goals reviewed with patient? No  SHORT TERM GOALS: Target date: 04/07/2023  Patient will be independent with initial home exercise program for self-management of symptoms. Baseline: Initial HEP provided at  IE (03/24/23); Goal status: In-progress   LONG TERM GOALS: Target date: 06/16/2023  Patient will be independent with a  long-term home exercise program for self-management of symptoms.  Baseline: Initial HEP provided at IE (03/24/23); Goal status: In-progress  2.  Patient will demonstrate improved FOTO by equal or greater than 10 points by visit #10 to demonstrate improvement in overall condition and self-reported functional ability.  Baseline: to be measured at visit 2 as appropriate - tech malfunction at IE (03/24/23); 39 at visit #2 (04/14/2023); 46 at visit #5 (04/26/2023);  Goal status: In-progress  3.  Patient will demonstrate the ability to rotate cervical spine equal or greater than 60 degrees each direction to improve her ability to view her surroundings and enjoy nature.  Baseline: R/L 43/30 (03/24/23); Goal status: In-progress  4.  Patient will score equal or greater than 39 seconds on the Deep Neck Flexor Endurance Test (DNFET) to match norms for pain free individuals in her age group (norm +1 standard deviation) and improve her ability to enjoy nature and participate in water aerobics.  Baseline: to be tested visit 2 as appropriate (03/24/23); 90 seconds (04/13/2023);  Goal status: MET  5.  Patient will demonstrate improvement in Patient Specific Functional Scale (PSFS) of equal or greater than 3 points to reflect clinically significant improvement in patient's most valued functional activities. Baseline: to be measured visit 2 as appropriate (03/24/23); 3.7 seconds (04/14/2023);  Goal status: In-progress  6.  Patient will report decrease in pain during functional activities to equal or less than 3/10 to improve her ability to sleep, view her surroundings, and participate in water aerobics.  Baseline: to be measured visit 2 as appropriate (03/24/23); Goal status: In-progress    PLAN:  PT FREQUENCY: 1-2x/week  PT DURATION: 8-12 weeks  PLANNED INTERVENTIONS: 97164- PT Re-evaluation, 97110-Therapeutic exercises, 97530- Therapeutic activity, [id number]- Neuromuscular re-education, 97140- Manual  therapy, [id number]- Aquatic Therapy, 97014- Electrical stimulation (unattended), [phone]- Electrical stimulation (manual), Patient/Family education, Balance training, Dry Needling, Joint mobilization, Spinal mobilization, Cryotherapy, and Moist heat  PLAN FOR NEXT SESSION: update HEP as appropriate, address joint restrictions and soft tissue tension/shortness, progressive postural/UE/functional strengthening and ROM exercises. Education, manual therapy and dry needling as needed.    Camie SAUNDERS. Juli, PT, DPT 05/10/23, 2:26 PM  [facility] [address] [place], [place] [address] P: [phone] I F: [phone]

## 2023-05-16 ENCOUNTER — Ambulatory Visit: Payer: 59 | Attending: Physical Medicine & Rehabilitation | Admitting: Physical Therapy

## 2023-05-16 ENCOUNTER — Telehealth: Payer: Self-pay | Admitting: Physical Therapy

## 2023-05-16 ENCOUNTER — Telehealth: Payer: Self-pay

## 2023-05-16 ENCOUNTER — Encounter: Payer: Self-pay | Admitting: Physical Therapy

## 2023-05-16 DIAGNOSIS — R293 Abnormal posture: Secondary | ICD-10-CM | POA: Insufficient documentation

## 2023-05-16 DIAGNOSIS — M6281 Muscle weakness (generalized): Secondary | ICD-10-CM | POA: Insufficient documentation

## 2023-05-16 DIAGNOSIS — M542 Cervicalgia: Secondary | ICD-10-CM | POA: Insufficient documentation

## 2023-05-16 NOTE — Telephone Encounter (Addendum)
 Called [facility] [place] at [phone] and left a message with Kayla, a call center specialist, asking Dr. Selinda P. Sharl, MD (who performed patient's total shoulder revision in 10/2021) what lifting restrictions she has related to that condition. Kayla agreed to pass the message to his team and someone would call me back.   Angela Fernandez. Juli, PT, DPT 05/16/23, 2:51 PM  Rosina, CMA from Dr. Sharl office called back and said patient missed her last appointment over a year ago where her lifting restriction was to be released. It is now as tolerated.   Angela Fernandez. Juli, PT, DPT 05/16/23, 3:44 PM   [facility] [address] [place], [place] [address] P: [phone] I F: [phone]

## 2023-05-16 NOTE — Telephone Encounter (Signed)
 Patient left a voicemail on my phone that she was calling to make a appointment. She canceled colonoscopy 04/06/2023. Called and left a message for call back

## 2023-05-16 NOTE — Therapy (Signed)
 OUTPATIENT PHYSICAL THERAPY TREATMENT   Patient Name: Angela Fernandez MRN: [id number] DOB:[birth year], 78 y.o., female Today's Date: 05/17/2023  END OF SESSION:  PT End of Session - 05/16/23 1506     Visit Number 8    Number of Visits 13    Date for PT Re-Evaluation 06/16/23    Authorization Type [employer] reporting period from 03/24/2023    Progress Note Due on Visit 10    PT Start Time 1503    PT Stop Time 1541    PT Time Calculation (min) 38 min    Activity Tolerance Patient tolerated treatment well    Behavior During Therapy WFL for tasks assessed/performed               PCP: Epifanio Alm SQUIBB, MD  REFERRING PROVIDER: Dodson Delon FERNS, MD  REFERRING DIAG: cervical radiculopathy, cervicalgia  THERAPY DIAG:  Cervicalgia  Abnormal posture  Muscle weakness (generalized)  Rationale for Evaluation and Treatment: Rehabilitation  ONSET DATE: 10/23/2021  PERTINENT HISTORY:  Patient is a 78 y.o. female who presents to outpatient physical therapy with a referral for medical diagnosis cervical radiculopathy, cervicalgia. This patient's chief complaints consist of neck pain and stiffness, leading to the following functional deficits: difficulty with water aerobics, turing to look at things, enjoying nature, sleeping. Relevant past medical history and comorbidities include has Status post reverse total shoulder replacement, right; Chronic venous insufficiency; Lymphedema; Diabetes (HCC); Hyperlipidemia; Essential hypertension; Closed fracture of neck of right scapula; Rotator cuff arthropathy, right; DJD (degenerative joint disease) of cervical spine; Pelvic pressure in female; Cystocele with rectocele; Mixed urinary incontinence due to female genital prolapse; Submucous leiomyoma of uterus; Thickened endometrium; Anxiety, generalized; History of depression; Memory difficulty; S/P reverse total shoulder arthroplasty, right; Hx of sleep apnea; Anemia; and  Hemochromatosis carrier on their problem list.  has a past medical history of Anemia, Arthritis, Bipolar affect, depressed (HCC), COPD (chronic obstructive pulmonary disease) (HCC), Diabetes mellitus without complication (HCC), GERD (gastroesophageal reflux disease), Hyperlipidemia, Hypertension, Hypothyroidism, PTSD (post-traumatic stress disorder), Schizoaffective disorder (HCC), Sleep apnea, Stroke (HCC), TIA (transient ischemic attack) (2010), and UTI (urinary tract infection).  has a past surgical history that includes Shoulder surgery (Right, 2020); Cholecystectomy; Breast surgery (Right); Joint replacement; Replacement total knee bilateral (Bilateral); Eye surgery; Cataract extraction w/ intraocular lens  implant, bilateral (Bilateral); Bilateral carpal tunnel release (Bilateral); Colonoscopy; Esophagogastroduodenoscopy; Nasal sinus surgery; Reverse shoulder arthroplasty (Right, 09/02/2020); Breast biopsy; and Total shoulder revision (Right, 10/23/2021).  Patient denies hx of cancer, seizures, heart problems, unexplained weight loss, unexplained changes in bowel or bladder problems, osteoporosis, and spinal surgery  SUBJECTIVE:  SUBJECTIVE STATEMENT: Patient states her neck is not turning terrifically today but does not hurt as it usually does when turning. She states her right shoulder has been bothering her some and she has some pain at her right upper trap. Patient states she saw her doctor for diabetes today and she said her A1c 5.3 and cut down her insulin . She also started her on Ozympic.   PAIN: NPRS: 3/10 at right upper trap  PRECAUTIONS: Fall, no official lifting restriction for R UE per Dr. Dr. Sharl office (phone conversation 05/16/2023)  PATIENT GOALS: to be able to strengthening my upper  arm and get my strength in that where I can hold it up for a while and get that pain under control   OBJECTIVE  TODAY'S TREATMENT:   Therapeutic exercise: to centralize symptoms and improve ROM, strength, muscular endurance, and activity tolerance required for successful completion of functional activities. -  NuStep level 6 using bilateral upper and lower extremities. Seat/handle setting 9/9. For improved extremity mobility, muscular endurance, and activity tolerance; and to induce the analgesic effect of aerobic exercise, stimulate improved joint nutrition, and prepare body structures and systems for following interventions. x 5  minutes. Average SPM = 56. (Increased B UT pain)  - seated cervical retraction AROM with self overpressure, 1x20, - seated cervical spine rotation SNAG with rolled pillow case, 2x10 each direction Step by step cuing to set up and execute correctly, teach-back utilized to reinforce learning (still needed cuing, but able to recognize when it was incorrect).    Therapeutic activities: dynamic activities for functional strengthening and improved functional activity tolerance. - squats with 8.8 lb bar (from sled) in back squat position, using straps to hands to hold it in place. Squat touch or as low as possible to touch 17 inch chair, 3x15. Exercise to improve postural strength/endurance in functional position and improve scapular retraction. approx 1-2 min rest between sets.   - single arm carry while walking around room, 10# KB, 1x60 seconds with each hand. Exercise to improve posture and postural strength/endurance while performing functional activity.    Pt required multimodal cuing for proper technique and to facilitate improved neuromuscular control, strength, range of motion, and functional ability resulting in improved performance and form.   PATIENT EDUCATION:  Education details: Exercise purpose/form. Self management techniques. Education on diagnosis,  prognosis, POC, anatomy and physiology of current condition. Education on HEP including handout. Person educated: Patient Education method: Explanation, Demonstration, and Handouts Education comprehension: verbalized understanding and needs further education  HOME EXERCISE PROGRAM: Access Code: [facility] URL: [email or web address] Date: 05/16/2023 Prepared by: Camie Cleverly  Exercises - Seated Cervical Rotation AROM  - 2 x daily - 2 sets - 10 reps - Seated Assisted Cervical Rotation with Towel  - 1 x daily - 2 sets - 20 reps - 5 seconds hold - standing cervical flexion/extension AROM  - 2 x daily - 2 sets - 10 reps - Supine Shoulder Horizontal Abduction with Resistance  - 3 x weekly - 3 sets - 20 reps - 30 seconds rest - 2 seconds each way tempo - blue band color - Seated Shoulder Diagonal with Resistance  - 3 x weekly - 3 sets - 20 reps - 2 seconds each way tempo - 30 seconds rest - red band color  HOME EXERCISE PROGRAM [[email or web address]] View at [email or web address] using code: [email or web address] Cervical Retraction in Sitting w/OP - MDT -  Repeat 10 Repetitions, Hold 1 Second(s), Complete 1 Set, Perform 5 Times a Day  ASSESSMENT: Patient arrives with improved neck ROM per observation and better controled neck pain. Today's session focused on advancing interventions for neck ROM using self AAROM SNAGS and continuing functional strengthening for posture and neck during squatting and carrying. Patient tolerated treatment well and reported improved pain by end of session. Patient was able to progress to heavier weight with single arm carry without limitations from her right shoulder, but she was limited by L > R foot pain with prolonged standing and walking. Dry needling was offered for suboccipital region, but patient preferred not to do dry needling due to increased pain after last attempt. She may benefit from manual therapy to the suboccipital region at future visits. Patient would benefit from  continued management of limiting condition by skilled physical therapist to address remaining impairments and functional limitations to work towards stated goals and return to PLOF or maximal functional independence.   From Initial Evaluation 03/24/2023: Patient is a 78 y.o. female referred to outpatient physical therapy with a medical diagnosis of cervical radiculopathy, cervicalgia who presents with signs and symptoms consistent with chronic neck pain and stiffness. Patient demonstrates deficits in cervical spine ROM and posture that are likely contributing to her neck pain. She does not appear to have radiculopathy but does have right shoulder dysfunction with RTSA revision that likely contributes to her neck pain.Patient presents with significant pain, ROM, joint stiffness, muscle performance (strength/power/endurance), balance, muscle tension, motor control, and activity tolerance impairments that are limiting ability to complete her usual activities including difficulty with water aerobics, turing to look at things, enjoying nature, sleeping without difficulty. Patient will benefit from skilled physical therapy intervention to address current body structure impairments and activity limitations to improve function and work towards goals set in current POC in order to return to prior level of function or maximal functional improvement.    OBJECTIVE IMPAIRMENTS: decreased activity tolerance, decreased balance, decreased coordination, decreased endurance, decreased knowledge of condition, decreased mobility, decreased ROM, decreased strength, hypomobility, impaired perceived functional ability, increased muscle spasms, impaired flexibility, impaired UE functional use, improper body mechanics, postural dysfunction, obesity, and pain.   ACTIVITY LIMITATIONS: sleeping and moving head, looking at things  PARTICIPATION LIMITATIONS: interpersonal relationship, community activity, and enjoying nature,  participating in water aerobics  PERSONAL FACTORS: Age, Past/current experiences, Time since onset of injury/illness/exacerbation, and 3+ comorbidities:   Status post reverse total shoulder replacement, right; Chronic venous insufficiency; Lymphedema; Diabetes (HCC); Hyperlipidemia; Essential hypertension; Closed fracture of neck of right scapula; Rotator cuff arthropathy, right; DJD (degenerative joint disease) of cervical spine; Pelvic pressure in female; Cystocele with rectocele; Mixed urinary incontinence due to female genital prolapse; Submucous leiomyoma of uterus; Thickened endometrium; Anxiety, generalized; History of depression; Memory difficulty; S/P reverse total shoulder arthroplasty, right; Hx of sleep apnea; Anemia; and Hemochromatosis carrier on their problem list.  has a past medical history of Anemia, Arthritis, Bipolar affect, depressed (HCC), COPD (chronic obstructive pulmonary disease) (HCC), Diabetes mellitus without complication (HCC), GERD (gastroesophageal reflux disease), Hyperlipidemia, Hypertension, Hypothyroidism, PTSD (post-traumatic stress disorder), Schizoaffective disorder (HCC), Sleep apnea, Stroke (HCC), TIA (transient ischemic attack) (2010), and UTI (urinary tract infection).  has a past surgical history that includes Shoulder surgery (Right, 2020); Cholecystectomy; Breast surgery (Right); Joint replacement; Replacement total knee bilateral (Bilateral); Eye surgery; Cataract extraction w/ intraocular lens  implant, bilateral (Bilateral); Bilateral carpal tunnel release (Bilateral); Colonoscopy; Esophagogastroduodenoscopy; Nasal sinus surgery; Reverse shoulder arthroplasty (Right, 09/02/2020); Breast biopsy; and Total shoulder revision (Right, 10/23/2021) are also affecting patient's functional outcome.  REHAB POTENTIAL: Fair due to chronicity of problem, comorbid conditions  CLINICAL DECISION MAKING: Evolving/moderate complexity  EVALUATION COMPLEXITY:  Moderate   GOALS: Goals reviewed with patient? No  SHORT TERM GOALS: Target date: 04/07/2023  Patient will be independent with initial home exercise program for self-management of symptoms. Baseline: Initial HEP provided at IE (03/24/23); Goal status: In-progress   LONG TERM GOALS: Target date: 06/16/2023  Patient will be independent with a long-term home exercise program for self-management of symptoms.  Baseline: Initial HEP provided at IE (03/24/23); Goal status: In-progress  2.  Patient will demonstrate improved FOTO by equal or greater than 10 points by visit #10 to demonstrate improvement in overall condition and self-reported functional ability.  Baseline: to be measured at visit 2 as appropriate - tech malfunction at IE (03/24/23); 39 at visit #2 (04/14/2023); 46 at visit #5 (04/26/2023);  Goal status: In-progress  3.  Patient will demonstrate the ability to rotate cervical spine equal or greater than 60 degrees each direction to improve her ability to view her surroundings and enjoy nature.  Baseline: R/L 43/30 (03/24/23); Goal status: In-progress  4.  Patient will score equal or greater than 39 seconds on the Deep Neck Flexor Endurance Test (DNFET) to match norms for pain free individuals in her age group (norm +1 standard deviation) and improve her ability to enjoy nature and participate in water aerobics.  Baseline: to be tested visit 2 as appropriate (03/24/23); 90 seconds (04/13/2023);  Goal status: MET  5.  Patient will demonstrate improvement in Patient Specific Functional Scale (PSFS) of equal or greater than 3 points to reflect clinically significant improvement in patient's most valued functional activities. Baseline: to be measured visit 2 as appropriate (03/24/23); 3.7 seconds (04/14/2023);  Goal status: In-progress  6.  Patient will report decrease in pain during functional activities to equal or less than 3/10 to improve her ability to sleep, view her  surroundings, and participate in water aerobics.  Baseline: to be measured visit 2 as appropriate (03/24/23); Goal status: In-progress    PLAN:  PT FREQUENCY: 1-2x/week  PT DURATION: 8-12 weeks  PLANNED INTERVENTIONS: 97164- PT Re-evaluation, 97110-Therapeutic exercises, 97530- Therapeutic activity, [id number]- Neuromuscular re-education, 97140- Manual therapy, [id number]- Aquatic Therapy, 97014- Electrical stimulation (unattended), [phone]- Electrical stimulation (manual), Patient/Family education, Balance training, Dry Needling, Joint mobilization, Spinal mobilization, Cryotherapy, and Moist heat  PLAN FOR NEXT SESSION: update HEP as appropriate, address joint restrictions and soft tissue tension/shortness, progressive postural/UE/functional strengthening and ROM exercises. Education, manual therapy and dry needling as needed.    Camie SAUNDERS. Juli, PT, DPT 05/17/23, 2:31 PM  [facility] [address] [place], [place] [address] P: [phone] I F: [phone]

## 2023-05-18 ENCOUNTER — Ambulatory Visit: Payer: 59 | Admitting: Physical Therapy

## 2023-05-19 ENCOUNTER — Encounter: Payer: 59 | Admitting: Physical Therapy
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
## 2023-05-23 ENCOUNTER — Ambulatory Visit: Payer: 59 | Admitting: Physical Therapy

## 2023-05-23 ENCOUNTER — Encounter: Payer: Self-pay | Admitting: Physical Therapy

## 2023-05-23 DIAGNOSIS — M542 Cervicalgia: Secondary | ICD-10-CM

## 2023-05-23 DIAGNOSIS — R293 Abnormal posture: Secondary | ICD-10-CM

## 2023-05-23 DIAGNOSIS — M6281 Muscle weakness (generalized): Secondary | ICD-10-CM

## 2023-05-23 NOTE — Therapy (Signed)
 OUTPATIENT PHYSICAL THERAPY TREATMENT   Patient Name: Angela Fernandez MRN: [id number] DOB:1946-02-20, 78 y.o., female Today's Date: 05/23/2023  END OF SESSION:  PT End of Session - 05/23/23 1039     Visit Number 9    Number of Visits 13    Date for PT Re-Evaluation 06/16/23    Authorization Type [employer]IC[employer] reporting period from 03/24/2023    Progress Note Due on Visit 10    PT Start Time 1033    PT Stop Time 1111    PT Time Calculation (min) 38 min    Activity Tolerance Patient tolerated treatment well    Behavior During Therapy WFL for tasks assessed/performed             PCP: Epifanio Alm SQUIBB, MD  REFERRING PROVIDER: Dodson Delon FERNS, MD  REFERRING DIAG: cervical radiculopathy, cervicalgia  THERAPY DIAG:  Cervicalgia  Abnormal posture  Muscle weakness (generalized)  Rationale for Evaluation and Treatment: Rehabilitation  ONSET DATE: 10/23/2021  PERTINENT HISTORY:  Patient is a 78 y.o. female who presents to outpatient physical therapy with a referral for medical diagnosis cervical radiculopathy, cervicalgia. This patient's chief complaints consist of neck pain and stiffness, leading to the following functional deficits: difficulty with water aerobics, turing to look at things, enjoying nature, sleeping. Relevant past medical history and comorbidities include has Status post reverse total shoulder replacement, right; Chronic venous insufficiency; Lymphedema; Diabetes (HCC); Hyperlipidemia; Essential hypertension; Closed fracture of neck of right scapula; Rotator cuff arthropathy, right; DJD (degenerative joint disease) of cervical spine; Pelvic pressure in female; Cystocele with rectocele; Mixed urinary incontinence due to female genital prolapse; Submucous leiomyoma of uterus; Thickened endometrium; Anxiety, generalized; History of depression; Memory difficulty; S/P reverse total shoulder arthroplasty, right; Hx of sleep apnea; Anemia; and  Hemochromatosis carrier on their problem list.  has a past medical history of Anemia, Arthritis, Bipolar affect, depressed (HCC), COPD (chronic obstructive pulmonary disease) (HCC), Diabetes mellitus without complication (HCC), GERD (gastroesophageal reflux disease), Hyperlipidemia, Hypertension, Hypothyroidism, PTSD (post-traumatic stress disorder), Schizoaffective disorder (HCC), Sleep apnea, Stroke (HCC), TIA (transient ischemic attack) (2010), and UTI (urinary tract infection).  has a past surgical history that includes Shoulder surgery (Right, 2020); Cholecystectomy; Breast surgery (Right); Joint replacement; Replacement total knee bilateral (Bilateral); Eye surgery; Cataract extraction w/ intraocular lens  implant, bilateral (Bilateral); Bilateral carpal tunnel release (Bilateral); Colonoscopy; Esophagogastroduodenoscopy; Nasal sinus surgery; Reverse shoulder arthroplasty (Right, 09/02/2020); Breast biopsy; and Total shoulder revision (Right, 10/23/2021).  Patient denies hx of cancer, seizures, heart problems, unexplained weight loss, unexplained changes in bowel or bladder problems, osteoporosis, and spinal surgery  SUBJECTIVE:  SUBJECTIVE STATEMENT: Patient arrives to PT stating her back has been hurting a lot since her last visit. She reports feeling stiffness in her neck today. Her neck felt really good leaving last visit but her feet hurt a lot until the following day. She had to take 3 rest breaks while trying to make her bed yesterday due to back pain and foot pain. She reports her breathing has improved since receiving her inhaler last week. She reports completing her cervical retraction AROM with overpressure 3 times a day and they are feeling good.    PAIN: NPRS: 0/10 at time of arrival. 2/10 in  left low back/hip.   PRECAUTIONS: Fall, no official lifting restriction for R UE per Dr. Dr. Sharl office (phone conversation 05/16/2023)  PATIENT GOALS: to be able to strengthening my upper arm and get my strength in that where I can hold it up for a while and get that pain under control   OBJECTIVE  TODAY'S TREATMENT:   Therapeutic exercise: to centralize symptoms and improve ROM, strength, muscular endurance, and activity tolerance required for successful completion of functional activities. -  NuStep level 6 using bilateral upper and lower extremities. Seat/handle setting 9/9. For improved extremity mobility, muscular endurance, and activity tolerance; and to induce the analgesic effect of aerobic exercise, stimulate improved joint nutrition, and prepare body structures and systems for following interventions. x 5  minutes. Average SPM = 57. (Increased left hip pain 2/10)  - seated cervical retraction AROM with self overpressure, 1x20, - seated cervical spine rotation SNAG with rolled pillow case, 2x10 each direction Step by step cuing to set up and execute correctly, teach-back utilized to reinforce learning (still needed cuing, but able to recognize when it was incorrect).    Therapeutic activities: dynamic activities for functional strengthening and improved functional activity tolerance.  Superset: - squats with 8.8 lb bar (from sled) in back squat position, using straps to hands to hold it in place. Squat touch or as low as possible to touch 17 inch chair, 2x15. Exercise to improve postural strength/endurance in functional position and improve scapular retraction. approx 1-2 min rest between sets.   - single arm carry while walking around room, 20# KB, 1x60 seconds with each hand. 20# KB with thick resistance band loop around handle, 1x60 seconds with each hand. Exercise to improve posture and postural strength/endurance while performing functional activity.    approx 1-2 min rest  between sets.   Pt required multimodal cuing for proper technique and to facilitate improved neuromuscular control, strength, range of motion, and functional ability resulting in improved performance and form.   PATIENT EDUCATION:  Education details: Exercise purpose/form. Self management techniques. Education on diagnosis, prognosis, POC, anatomy and physiology of current condition. Education on HEP including handout. Person educated: Patient Education method: Explanation, Demonstration, and Handouts Education comprehension: verbalized understanding and needs further education  HOME EXERCISE PROGRAM: Access Code: [facility] URL: [email or web address] Date: 05/16/2023 Prepared by: Camie Cleverly  Exercises - Seated Cervical Rotation AROM  - 2 x daily - 2 sets - 10 reps - Seated Assisted Cervical Rotation with Towel  - 1 x daily - 2 sets - 20 reps - 5 seconds hold - Standing cervical flexion/extension AROM  - 2 x daily - 2 sets - 10 reps - Supine Shoulder Horizontal Abduction with Resistance  - 3 x weekly - 3 sets - 20 reps - 30 seconds rest - 2 seconds each way tempo - blue band color - Seated Shoulder Diagonal with  Resistance  - 3 x weekly - 3 sets - 20 reps - 2 seconds each way tempo - 30 seconds rest - red band color  HOME EXERCISE PROGRAM [[email or web address]] View at [email or web address] using code: [email or web address] Cervical Retraction in Sitting w/OP - MDT -  Repeat 10 Repetitions, Hold 1 Second(s), Complete 1 Set, Perform 5 Times a Day  ASSESSMENT: Patient arrives with improved neck ROM and better controlled neck pain. Today's session focused on reviewing interventions for improving neck ROM using self AAROM SNAGS and continuing functional strengthening for posture and neck during carrying and squatting activities. Patient tolerated treatment interventions well and reported 0/10 pain by end of session. She still required cuing for set up and execution of rotational SNAGS, but was able to  recognize when it was incorrect. She was able to progress to 20# KB with single arm carry without limitations or increase in neck and shoulder pain. She needed verbal cuing to stand tall and activate scapular muscles, but was able to maintain proper form after initial correction. To increase perturbations and challenge balance, a thick resistance band loop was placed around the handle of the KB for the second round of 60 second walking. She reported discomfort in her feet (L > R) with prolonged walking, but was able to continue the exercise. Patient would benefit from continued management of limiting condition by skilled physical therapist to address remaining impairments and functional limitations to work towards stated goals and return to PLOF or maximal functional independence.   From Initial Evaluation 03/24/2023: Patient is a 78 y.o. female referred to outpatient physical therapy with a medical diagnosis of cervical radiculopathy, cervicalgia who presents with signs and symptoms consistent with chronic neck pain and stiffness. Patient demonstrates deficits in cervical spine ROM and posture that are likely contributing to her neck pain. She does not appear to have radiculopathy but does have right shoulder dysfunction with RTSA revision that likely contributes to her neck pain.Patient presents with significant pain, ROM, joint stiffness, muscle performance (strength/power/endurance), balance, muscle tension, motor control, and activity tolerance impairments that are limiting ability to complete her usual activities including difficulty with water aerobics, turing to look at things, enjoying nature, sleeping without difficulty. Patient will benefit from skilled physical therapy intervention to address current body structure impairments and activity limitations to improve function and work towards goals set in current POC in order to return to prior level of function or maximal functional improvement.     OBJECTIVE IMPAIRMENTS: decreased activity tolerance, decreased balance, decreased coordination, decreased endurance, decreased knowledge of condition, decreased mobility, decreased ROM, decreased strength, hypomobility, impaired perceived functional ability, increased muscle spasms, impaired flexibility, impaired UE functional use, improper body mechanics, postural dysfunction, obesity, and pain.   ACTIVITY LIMITATIONS: sleeping and moving head, looking at things  PARTICIPATION LIMITATIONS: interpersonal relationship, community activity, and enjoying nature, participating in water aerobics  PERSONAL FACTORS: Age, Past/current experiences, Time since onset of injury/illness/exacerbation, and 3+ comorbidities:   Status post reverse total shoulder replacement, right; Chronic venous insufficiency; Lymphedema; Diabetes (HCC); Hyperlipidemia; Essential hypertension; Closed fracture of neck of right scapula; Rotator cuff arthropathy, right; DJD (degenerative joint disease) of cervical spine; Pelvic pressure in female; Cystocele with rectocele; Mixed urinary incontinence due to female genital prolapse; Submucous leiomyoma of uterus; Thickened endometrium; Anxiety, generalized; History of depression; Memory difficulty; S/P reverse total shoulder arthroplasty, right; Hx of sleep apnea; Anemia; and Hemochromatosis carrier on their problem list.  has a past medical history of Anemia, Arthritis, Bipolar affect, depressed (HCC), COPD (  chronic obstructive pulmonary disease) (HCC), Diabetes mellitus without complication (HCC), GERD (gastroesophageal reflux disease), Hyperlipidemia, Hypertension, Hypothyroidism, PTSD (post-traumatic stress disorder), Schizoaffective disorder (HCC), Sleep apnea, Stroke (HCC), TIA (transient ischemic attack) (2010), and UTI (urinary tract infection).  has a past surgical history that includes Shoulder surgery (Right, 2020); Cholecystectomy; Breast surgery (Right); Joint replacement;  Replacement total knee bilateral (Bilateral); Eye surgery; Cataract extraction w/ intraocular lens  implant, bilateral (Bilateral); Bilateral carpal tunnel release (Bilateral); Colonoscopy; Esophagogastroduodenoscopy; Nasal sinus surgery; Reverse shoulder arthroplasty (Right, 09/02/2020); Breast biopsy; and Total shoulder revision (Right, 10/23/2021) are also affecting patient's functional outcome.   REHAB POTENTIAL: Fair due to chronicity of problem, comorbid conditions  CLINICAL DECISION MAKING: Evolving/moderate complexity  EVALUATION COMPLEXITY: Moderate   GOALS: Goals reviewed with patient? No  SHORT TERM GOALS: Target date: 04/07/2023  Patient will be independent with initial home exercise program for self-management of symptoms. Baseline: Initial HEP provided at IE (03/24/23); Goal status: In-progress   LONG TERM GOALS: Target date: 06/16/2023  Patient will be independent with a long-term home exercise program for self-management of symptoms.  Baseline: Initial HEP provided at IE (03/24/23); Goal status: In-progress  2.  Patient will demonstrate improved FOTO by equal or greater than 10 points by visit #10 to demonstrate improvement in overall condition and self-reported functional ability.  Baseline: to be measured at visit 2 as appropriate - tech malfunction at IE (03/24/23); 39 at visit #2 (04/14/2023); 46 at visit #5 (04/26/2023);  Goal status: In-progress  3.  Patient will demonstrate the ability to rotate cervical spine equal or greater than 60 degrees each direction to improve her ability to view her surroundings and enjoy nature.  Baseline: R/L 43/30 (03/24/23); Goal status: In-progress  4.  Patient will score equal or greater than 39 seconds on the Deep Neck Flexor Endurance Test (DNFET) to match norms for pain free individuals in her age group (norm +1 standard deviation) and improve her ability to enjoy nature and participate in water aerobics.  Baseline: to be tested  visit 2 as appropriate (03/24/23); 90 seconds (04/13/2023);  Goal status: MET  5.  Patient will demonstrate improvement in Patient Specific Functional Scale (PSFS) of equal or greater than 3 points to reflect clinically significant improvement in patient's most valued functional activities. Baseline: to be measured visit 2 as appropriate (03/24/23); 3.7 seconds (04/14/2023);  Goal status: In-progress  6.  Patient will report decrease in pain during functional activities to equal or less than 3/10 to improve her ability to sleep, view her surroundings, and participate in water aerobics.  Baseline: to be measured visit 2 as appropriate (03/24/23); Goal status: In-progress    PLAN:  PT FREQUENCY: 1-2x/week  PT DURATION: 8-12 weeks  PLANNED INTERVENTIONS: 97164- PT Re-evaluation, 97110-Therapeutic exercises, 97530- Therapeutic activity, [id number]- Neuromuscular re-education, 97140- Manual therapy, [id number]- Aquatic Therapy, 97014- Electrical stimulation (unattended), [id number]- Electrical stimulation (manual), Patient/Family education, Balance training, Dry Needling, Joint mobilization, Spinal mobilization, Cryotherapy, and Moist heat  PLAN FOR NEXT SESSION: update HEP as appropriate, address joint restrictions and soft tissue tension/shortness, progressive postural/UE/functional strengthening and ROM exercises. Education, manual therapy and dry needling as needed.   Elmira Olkowski, [place], Student-PT   [place]. Juli, PT, DPT 05/23/23, 2:03 PM  [facility] [address] [place], [place] [address] P: [phone] I F: [phone]

## 2023-05-25 ENCOUNTER — Ambulatory Visit (INDEPENDENT_AMBULATORY_CARE_PROVIDER_SITE_OTHER): Payer: 59 | Admitting: Licensed Clinical Social Worker

## 2023-05-25 DIAGNOSIS — F411 Generalized anxiety disorder: Secondary | ICD-10-CM | POA: Diagnosis not present

## 2023-05-25 DIAGNOSIS — F4310 Post-traumatic stress disorder, unspecified: Secondary | ICD-10-CM

## 2023-05-25 DIAGNOSIS — F259 Schizoaffective disorder, unspecified: Secondary | ICD-10-CM | POA: Diagnosis not present

## 2023-05-25 NOTE — Progress Notes (Signed)
   THERAPIST PROGRESS NOTE  Session Time: 10-10:51am  Participation Level: Active  Behavioral Response: CasualAlertTearful  Type of Therapy: Individual Therapy  Treatment Goals addressed: STG: Ayannah will identify situations, thoughts, and feelings that trigger internal anger, and/or angry/aggressive actions as evidenced by self-report    Reduce overall frequency, intensity and duration of anxiety so that daily functioning is not impaired per pt self report 3 out of 5 sessions.  Recognize, accept and cope with feelings of depression per pt self report 3 out of 5 sessions   ProgressTowards Goals: Progressing  Interventions: Solution Focused, Assertiveness Training, and Supportive  Summary: Angela Fernandez is a 78 y.o. female who presents with symptoms of anxiety. Patient reports uncontrollable worry, irritability, and racing thoughts. Patient presents today feeling "spacey." Pt was oriented times 5. Pt was cooperative and engaged. Pt denies SI/HI/AVH.   Patient utilized therapeutic space to process recent life events reflecting on progress in her physical health and improvements within her relationship with her granddaughter.  Revisited stress related to current living situation and patient's feelings towards loss of independence.  Clinician worked with patient on reframing perspective and shifting to focusing on the positive experiences she has had since moving from up [place].  Patient became tearful processing feelings of regret related to the move.  Clinician explored patient's motivation to seek out alternative housing resources such as assistance through [place].  Patient expressed an interest reporting she received paperwork last summer and had explored that option with her daughter.  Patient expressed a desire to revisit that conversation in the coming weeks.  Clinician worked with patient to identify previous living arrangements and areas which she was happiest.  Patient identified pattern  was often ability to access nature.  Explored opportunities to access trails and parks from her current location.  Patient identified she lives close to a public park but identified transportation difficulties.  Clinician worked with patient on problem solving ways she can address to transportation in order to get to the park to go kayaking as patient expressed a desire to do.  Suicidal/Homicidal: Nowithout intent/plan  Therapist Response: Clinician utilized active and supportive reflection to create a safe environment for patient to process recent events. Clinician assessed for current stressors, symptoms, safety since last session. Clinician utilized CBT techniques to assist in reframing unhelpful thoughts about her current living arrangement. Introduced pt to resources through [employer] for housing.  Worked with patient on reflecting on peaceful places and patient identified a lot for nature.  Clinician worked with patient to problem solve ways she can address transportation difficulties in getting to a local park.  Plan: Return again in 2 weeks.  Diagnosis: Schizoaffective disorder, unspecified type (HCC)  PTSD (post-traumatic stress disorder)  Anxiety state   Collaboration of Care: AEB psychiatrist can access notes and cln. Will review psychiatrists' notes. Check in with the patient and will see LCSW per availability. Patient agreed with treatment recommendations.   Patient/Guardian was advised Release of Information must be obtained prior to any record release in order to collaborate their care with an outside provider. Patient/Guardian was advised if they have not already done so to contact the registration department to sign all necessary forms in order for us  to release information regarding their care.   Consent: Patient/Guardian gives verbal consent for treatment and assignment of benefits for services provided during this visit. Patient/Guardian expressed understanding and agreed to  proceed.   Marvin Slot, LCSW 05/25/2023

## 2023-05-26 ENCOUNTER — Ambulatory Visit (INDEPENDENT_AMBULATORY_CARE_PROVIDER_SITE_OTHER): Payer: 59 | Admitting: Psychiatry

## 2023-05-26 ENCOUNTER — Encounter: Payer: 59 | Admitting: Physical Therapy

## 2023-05-26 ENCOUNTER — Encounter: Payer: Self-pay | Admitting: Psychiatry

## 2023-05-26 VITALS — BP 109/67 | HR 73 | Temp 96.4°F | Ht 65.0 in | Wt 203.0 lb

## 2023-05-26 DIAGNOSIS — F4310 Post-traumatic stress disorder, unspecified: Secondary | ICD-10-CM

## 2023-05-26 DIAGNOSIS — F259 Schizoaffective disorder, unspecified: Secondary | ICD-10-CM | POA: Diagnosis not present

## 2023-05-26 DIAGNOSIS — F411 Generalized anxiety disorder: Secondary | ICD-10-CM

## 2023-05-26 MED ORDER — CLONAZEPAM 1 MG PO TABS: 1.0000 mg | ORAL_TABLET | Freq: Two times a day (BID) | ORAL | 1 refills | Status: DC | PRN

## 2023-05-26 MED ORDER — ZOLPIDEM TARTRATE 5 MG PO TABS: 5.0000 mg | ORAL_TABLET | Freq: Every evening | ORAL | 1 refills | Status: DC | PRN

## 2023-05-26 NOTE — Patient Instructions (Signed)
Continue Pristiq 50 mg every day Continue bupropion 200 mg twice a day Continue Latuda 120 mg at night  Continue clonazepam 1 mg twice a day Continue Ambien 5 mg at night as needed for insomnia Next appointment: 3/24 at 10 AM

## 2023-05-30 ENCOUNTER — Encounter: Payer: Self-pay | Admitting: Physical Therapy

## 2023-05-30 ENCOUNTER — Ambulatory Visit: Payer: 59 | Admitting: Physical Therapy

## 2023-05-30 DIAGNOSIS — R293 Abnormal posture: Secondary | ICD-10-CM

## 2023-05-30 DIAGNOSIS — M542 Cervicalgia: Secondary | ICD-10-CM

## 2023-05-30 DIAGNOSIS — M6281 Muscle weakness (generalized): Secondary | ICD-10-CM

## 2023-05-30 NOTE — Therapy (Signed)
OUTPATIENT PHYSICAL THERAPY TREATMENT / PROGRESS NOTE Dates of reporting from 03/24/2023 to 05/30/2023   Patient Name: Angela Fernandez MRN: [id number] DOB:[birth year], 78 y.o., female Today's Date: 05/30/2023  END OF SESSION:  PT End of Session - 05/30/23 1142     Visit Number 10    Number of Visits 13    Date for PT Re-Evaluation 06/16/23    Authorization Type [employer] reporting period from 03/24/2023    Progress Note Due on Visit 10    PT Start Time 1122    PT Stop Time 1202    PT Time Calculation (min) 40 min    Activity Tolerance Patient tolerated treatment well    Behavior During Therapy WFL for tasks assessed/performed              PCP: Mick Sell, MD  REFERRING PROVIDER: Elijah Birk, MD  REFERRING DIAG: cervical radiculopathy, cervicalgia  THERAPY DIAG:  Cervicalgia  Abnormal posture  Muscle weakness (generalized)  Rationale for Evaluation and Treatment: Rehabilitation  ONSET DATE: 10/23/2021  PERTINENT HISTORY:  Patient is a 78 y.o. female who presents to outpatient physical therapy with a referral for medical diagnosis cervical radiculopathy, cervicalgia. This patient's chief complaints consist of neck pain and stiffness, leading to the following functional deficits: difficulty with water aerobics, turing to look at things, enjoying nature, sleeping. Relevant past medical history and comorbidities include has Status post reverse total shoulder replacement, right; Chronic venous insufficiency; Lymphedema; Diabetes (HCC); Hyperlipidemia; Essential hypertension; Closed fracture of neck of right scapula; Rotator cuff arthropathy, right; DJD (degenerative joint disease) of cervical spine; Pelvic pressure in female; Cystocele with rectocele; Mixed urinary incontinence due to female genital prolapse; Submucous leiomyoma of uterus; Thickened endometrium; Anxiety, generalized; History of depression; Memory difficulty; S/P reverse total  shoulder arthroplasty, right; Hx of sleep apnea; Anemia; and Hemochromatosis carrier on their problem list.  has a past medical history of Anemia, Arthritis, Bipolar affect, depressed (HCC), COPD (chronic obstructive pulmonary disease) (HCC), Diabetes mellitus without complication (HCC), GERD (gastroesophageal reflux disease), Hyperlipidemia, Hypertension, Hypothyroidism, PTSD (post-traumatic stress disorder), Schizoaffective disorder (HCC), Sleep apnea, Stroke (HCC), TIA (transient ischemic attack) (2010), and UTI (urinary tract infection).  has a past surgical history that includes Shoulder surgery (Right, 2020); Cholecystectomy; Breast surgery (Right); Joint replacement; Replacement total knee bilateral (Bilateral); Eye surgery; Cataract extraction w/ intraocular lens  implant, bilateral (Bilateral); Bilateral carpal tunnel release (Bilateral); Colonoscopy; Esophagogastroduodenoscopy; Nasal sinus surgery; Reverse shoulder arthroplasty (Right, 09/02/2020); Breast biopsy; and Total shoulder revision (Right, 10/23/2021).  Patient denies hx of cancer, seizures, heart problems, unexplained weight loss, unexplained changes in bowel or bladder problems, osteoporosis, and spinal surgery  SUBJECTIVE:  SUBJECTIVE STATEMENT: Patient stating her neck feels stiff and she is having a lot of pain in her feet, her back, and some in her neck. Her hands hurt yesterday. She feels like she needs to get some shots she gets periodically for these areas. She does not think PT is making her body-wide pain worse, but is unsure if it is making it better. She thinks PT has helped her neck a little bit. She states that before PT her neck hurt when she walked, but it does not hurt now when she walks. She has been doing her HEP once a day, but  not the SNAG exercise.    PAIN: NPRS: 4/10 at right neck when she turns it.   2/10 in left low back/hip.   PRECAUTIONS: Fall, no official lifting restriction for R UE per Dr. Dr. Aundria Rud office (phone conversation 05/16/2023)  PATIENT GOALS: "to be able to strengthening my upper arm and get my strength in that where I can hold it up for a while and get that pain under control"   OBJECTIVE  SELF-REPORTED FUNCTION FOTO score: 51/100 (neck questionnaire)   SELF-REPORTED FUNCTION Patient Specific Functional Scale (PSFS)  vacuuming: 7 Turning head: 10 sleeping: 10 Average: 9/10  SPINE MOTION CERVICAL SPINE AROM *Indicates pain Flexion: 47 mild pain Extension: 40 mild pain Side Flexion:       R 18 contralateral pain      L 20 ipsilateral pain Rotation:  R 54 pain at B base of neck L 45 pain at left base of neck Protraction: WFL Retrusion: 1 1/8 inches   TODAY'S TREATMENT:   Therapeutic exercise: to centralize symptoms and improve ROM, strength, muscular endurance, and activity tolerance required for successful completion of functional activities. -NuStep level 6 using bilateral upper and lower extremities. Seat/handle setting 9/9. For improved extremity mobility, muscular endurance, and activity tolerance; and to induce the analgesic effect of aerobic exercise, stimulate improved joint nutrition, and prepare body structures and systems for following interventions. x 7:41  minutes. Average SPM = 61.  - seated cervical retraction AROM with self overpressure, 1x20,  - measurements to assess progress (See above)(  - seated cervical spine rotation SNAG with rolled pillow case, 2x20 each direction Mild cuing for set up, then able to perform other side without cuing.   Standing scapular retraction with GTB across front of body, 2x20  Education on HEP including handout   Pt required multimodal cuing for proper technique and to facilitate improved neuromuscular control, strength,  range of motion, and functional ability resulting in improved performance and form.   PATIENT EDUCATION:  Education details: Exercise purpose/form. Self management techniques. Education on diagnosis, prognosis, POC, anatomy and physiology of current condition. Education on HEP including handout. Person educated: Patient Education method: Explanation, Demonstration, and Handouts Education comprehension: verbalized understanding and needs further education  HOME EXERCISE PROGRAM: Access Code: [facility] URL: [email or web address] Date: 05/30/2023 Prepared by: Norton Blizzard  Exercises - Cervical Retraction with Overpressure  - 2-3 x daily - 2 sets - 10 reps - 1 second hold - Seated Cervical Rotation AROM  - 2 x daily - 2 sets - 10 reps - standing cervical flexion/extension AROM  - 2 x daily - 2 sets - 10 reps - Seated Assisted Cervical Rotation with Towel  - 2 x daily - 1 sets - 20 reps - 5 seconds hold - Staggered Stance Scapular Retraction and Depression with Resistance  - 1 x daily - 2 sets - 20 reps  ASSESSMENT:  Patient has attended 10 physical therapy visits since starting current episode of care on 03/23/2024. She has met her short term goal and met 3/6 long term goals including her FOTO goal, PSFS, and deep neck flexor endurance goal. She has made good progress towards her remaining goals, but would benefit from continuing PT for 1-2 more weeks to finalized long term HEP and attempt to meet her cervical spine range of motion and pain goal. Patient would benefit from continued management of limiting condition by skilled physical therapist to address remaining impairments and functional limitations to work towards stated goals and return to PLOF or maximal functional independence.   From Initial Evaluation 03/24/2023: Patient is a 78 y.o. female referred to outpatient physical therapy with a medical diagnosis of cervical radiculopathy, cervicalgia who presents with signs and  symptoms consistent with chronic neck pain and stiffness. Patient demonstrates deficits in cervical spine ROM and posture that are likely contributing to her neck pain. She does not appear to have radiculopathy but does have right shoulder dysfunction with RTSA revision that likely contributes to her neck pain.Patient presents with significant pain, ROM, joint stiffness, muscle performance (strength/power/endurance), balance, muscle tension, motor control, and activity tolerance impairments that are limiting ability to complete her usual activities including difficulty with water aerobics, turing to look at things, enjoying nature, sleeping without difficulty. Patient will benefit from skilled physical therapy intervention to address current body structure impairments and activity limitations to improve function and work towards goals set in current POC in order to return to prior level of function or maximal functional improvement.    OBJECTIVE IMPAIRMENTS: decreased activity tolerance, decreased balance, decreased coordination, decreased endurance, decreased knowledge of condition, decreased mobility, decreased ROM, decreased strength, hypomobility, impaired perceived functional ability, increased muscle spasms, impaired flexibility, impaired UE functional use, improper body mechanics, postural dysfunction, obesity, and pain.   ACTIVITY LIMITATIONS: sleeping and moving head, looking at things  PARTICIPATION LIMITATIONS: interpersonal relationship, community activity, and enjoying nature, participating in water aerobics  PERSONAL FACTORS: Age, Past/current experiences, Time since onset of injury/illness/exacerbation, and 3+ comorbidities:   Status post reverse total shoulder replacement, right; Chronic venous insufficiency; Lymphedema; Diabetes (HCC); Hyperlipidemia; Essential hypertension; Closed fracture of neck of right scapula; Rotator cuff arthropathy, right; DJD (degenerative joint disease) of  cervical spine; Pelvic pressure in female; Cystocele with rectocele; Mixed urinary incontinence due to female genital prolapse; Submucous leiomyoma of uterus; Thickened endometrium; Anxiety, generalized; History of depression; Memory difficulty; S/P reverse total shoulder arthroplasty, right; Hx of sleep apnea; Anemia; and Hemochromatosis carrier on their problem list.  has a past medical history of Anemia, Arthritis, Bipolar affect, depressed (HCC), COPD (chronic obstructive pulmonary disease) (HCC), Diabetes mellitus without complication (HCC), GERD (gastroesophageal reflux disease), Hyperlipidemia, Hypertension, Hypothyroidism, PTSD (post-traumatic stress disorder), Schizoaffective disorder (HCC), Sleep apnea, Stroke (HCC), TIA (transient ischemic attack) (2010), and UTI (urinary tract infection).  has a past surgical history that includes Shoulder surgery (Right, 2020); Cholecystectomy; Breast surgery (Right); Joint replacement; Replacement total knee bilateral (Bilateral); Eye surgery; Cataract extraction w/ intraocular lens  implant, bilateral (Bilateral); Bilateral carpal tunnel release (Bilateral); Colonoscopy; Esophagogastroduodenoscopy; Nasal sinus surgery; Reverse shoulder arthroplasty (Right, 09/02/2020); Breast biopsy; and Total shoulder revision (Right, 10/23/2021) are also affecting patient's functional outcome.   REHAB POTENTIAL: Fair due to chronicity of problem, comorbid conditions  CLINICAL DECISION MAKING: Evolving/moderate complexity  EVALUATION COMPLEXITY: Moderate   GOALS: Goals reviewed with patient? No  SHORT TERM GOALS: Target date: 04/07/2023  Patient will be independent with initial home  exercise program for self-management of symptoms. Baseline: Initial HEP provided at IE (03/24/23); participating once a day (05/30/2023);  Goal status: MET   LONG TERM GOALS: Target date: 06/16/2023  Patient will be independent with a long-term home exercise program for self-management of  symptoms.  Baseline: Initial HEP provided at IE (03/24/23); participating once a day (05/30/2023);  Goal status: In-progress  2.  Patient will demonstrate improved FOTO by equal or greater than 10 points by visit #10 to demonstrate improvement in overall condition and self-reported functional ability.  Baseline: to be measured at visit 2 as appropriate - tech malfunction at IE (03/24/23); 39 at visit #2 (04/14/2023); 46 at visit #5 (04/26/2023); 51 at visit #10 (05/30/2023);  Goal status: MET  3.  Patient will demonstrate the ability to rotate cervical spine equal or greater than 60 degrees each direction to improve her ability to view her surroundings and enjoy nature.  Baseline: R/L 43/30 (03/24/23); R/L 54/45 (05/30/23);  Goal status: In-progress  4.  Patient will score equal or greater than 39 seconds on the Deep Neck Flexor Endurance Test (DNFET) to match norms for pain free individuals in her age group (norm +1 standard deviation) and improve her ability to enjoy nature and participate in water aerobics.  Baseline: to be tested visit 2 as appropriate (03/24/23); 90 seconds (04/13/2023);  Goal status: MET  5.  Patient will demonstrate improvement in Patient Specific Functional Scale (PSFS) of equal or greater than 3 points to reflect clinically significant improvement in patient's most valued functional activities. Baseline: to be measured visit 2 as appropriate (03/24/23); 3.7/10 (04/14/2023); 9/10 at visit 10 (05/30/2023); Goal status: MET  6.  Patient will report decrease in pain during functional activities to equal or less than 3/10 to improve her ability to sleep, view her surroundings, and participate in water aerobics.  Baseline: up to 7/10 (03/24/23); up to 6/10 but less frequent (05/30/2023);  Goal status: In-progress    PLAN:  PT FREQUENCY: 1-2x/week  PT DURATION: 8-12 weeks  PLANNED INTERVENTIONS: 97164- PT Re-evaluation, 97110-Therapeutic exercises, 97530- Therapeutic  activity, [id number]- Neuromuscular re-education, 97140- Manual therapy, [id number]- Aquatic Therapy, 97014- Electrical stimulation (unattended), [phone]- Electrical stimulation (manual), Patient/Family education, Balance training, Dry Needling, Joint mobilization, Spinal mobilization, Cryotherapy, and Moist heat  PLAN FOR NEXT SESSION: update HEP as appropriate, address joint restrictions and soft tissue tension/shortness, progressive postural/UE/functional strengthening and ROM exercises. Education, manual therapy and dry needling as needed.   Angela Fernandez. Ilsa Iha, PT, DPT 05/30/23, 7:18 PM  [facility] [address] [place], [place] [address] P: [phone] I F: [phone]

## 2023-06-01 ENCOUNTER — Ambulatory Visit: Payer: 59 | Admitting: Physical Therapy

## 2023-06-02 ENCOUNTER — Encounter: Payer: 59 | Admitting: Physical Therapy

## 2023-06-06 ENCOUNTER — Ambulatory Visit (INDEPENDENT_AMBULATORY_CARE_PROVIDER_SITE_OTHER): Payer: 59 | Admitting: Licensed Clinical Social Worker

## 2023-06-06 ENCOUNTER — Encounter: Payer: Self-pay | Admitting: Physical Therapy

## 2023-06-06 ENCOUNTER — Ambulatory Visit: Payer: 59 | Admitting: Physical Therapy

## 2023-06-06 DIAGNOSIS — F411 Generalized anxiety disorder: Secondary | ICD-10-CM | POA: Diagnosis not present

## 2023-06-06 DIAGNOSIS — F4310 Post-traumatic stress disorder, unspecified: Secondary | ICD-10-CM

## 2023-06-06 DIAGNOSIS — M6281 Muscle weakness (generalized): Secondary | ICD-10-CM

## 2023-06-06 DIAGNOSIS — M542 Cervicalgia: Secondary | ICD-10-CM | POA: Diagnosis not present

## 2023-06-06 DIAGNOSIS — R293 Abnormal posture: Secondary | ICD-10-CM

## 2023-06-06 DIAGNOSIS — F259 Schizoaffective disorder, unspecified: Secondary | ICD-10-CM | POA: Diagnosis not present

## 2023-06-06 NOTE — Progress Notes (Signed)
   THERAPIST PROGRESS NOTE  Session Time: 10:04am-10:44am  Participation Level: Active  Behavioral Response: CasualAlertEuthymic  Type of Therapy: Individual Therapy  Treatment Goals addressed: STG: Chaya will identify situations, thoughts, and feelings that trigger internal anger, and/or angry/aggressive actions as evidenced by self-report    Reduce overall frequency, intensity and duration of anxiety so that daily functioning is not impaired per pt self report 3 out of 5 sessions.  Recognize, accept and cope with feelings of depression per pt self report 3 out of 5 sessions   ProgressTowards Goals: Progressing  Interventions: CBT, Meditation: Mindfulness, and Reframing  Summary: Angela Fernandez is a 78 y.o. female who presents with symptoms of anxiety. Patient reports uncontrollable worry, irritability, nightmares, and racing thoughts. Patient presents today feeling "spacey." Pt was oriented times 5. Pt was cooperative and engaged. Pt denies SI/HI/AVH.   Patient utilized therapeutic space to process positive shifts in her perspective and life situations patient identified excitement about receiving proper dental care.  Reviewed self-care plan to visit neighboring park.  Patient identified improvements as barriers to self-care have been mitigated.  Reflected on improved relationship with her family and socialization with others.  Patient reflected on recent positive family outing.  Patient identified she also experiences intrusive thoughts related to "Catholic guilt."  Patient identified she often thinks "terrible things "related to past mistakes.  Patient identifies she copes by trying to distract herself but experiences these intrusive thoughts multiple times a day and often experiences nightmares where people are trying to harm her.  She also identifies distress following social interactions where she will often times replay and over analyzes what she said, which results in her isolating.   Patient identifies she often watches the news and has it on in the background day-to-day as a result of keeping to herself and has limited positive interactions.  Clinician educated patient on grounding techniques such as the 5 senses exercise to work on educating the patient on mindfulness techniques before progressing to CBT techniques and reframing unhelpful thought patterns.  Suicidal/Homicidal: Nowithout intent/plan  Therapist Response: Clinician utilized active and supportive reflection to create a safe environment for patient to process recent events. Clinician assessed for current stressors, symptoms, safety since last session.  Assess for unhelpful thought patterns and route.  Educated patient on mindfulness techniques.  Plan: Return again in 2 weeks.  Diagnosis: Schizoaffective disorder, unspecified type (HCC)  PTSD (post-traumatic stress disorder)  Anxiety state   Collaboration of Care: AEB psychiatrist can access notes and cln. Will review psychiatrists' notes. Check in with the patient and will see LCSW per availability. Patient agreed with treatment recommendations.   Patient/Guardian was advised Release of Information must be obtained prior to any record release in order to collaborate their care with an outside provider. Patient/Guardian was advised if they have not already done so to contact the registration department to sign all necessary forms in order for [place] to release information regarding their care.   Consent: Patient/Guardian gives verbal consent for treatment and assignment of benefits for services provided during this visit. Patient/Guardian expressed understanding and agreed to proceed.   Dereck Leep, LCSW 06/06/2023

## 2023-06-06 NOTE — Therapy (Signed)
OUTPATIENT PHYSICAL THERAPY TREATMENT    Patient Name: Angela Fernandez MRN: [id number] DOB:05/12/1945, 78 y.o., female Today's Date: 06/06/2023  END OF SESSION:  PT End of Session - 06/06/23 1441     Visit Number 11    Number of Visits 13    Date for PT Re-Evaluation 06/16/23    Authorization Type [employer] reporting period from 05/30/2023    Progress Note Due on Visit 13    PT Start Time 1434    PT Stop Time 1512    PT Time Calculation (min) 38 min    Activity Tolerance Patient tolerated treatment well    Behavior During Therapy WFL for tasks assessed/performed              PCP: Mick Sell, MD  REFERRING PROVIDER: Elijah Birk, MD  REFERRING DIAG: cervical radiculopathy, cervicalgia  THERAPY DIAG:  Cervicalgia  Abnormal posture  Muscle weakness (generalized)  Rationale for Evaluation and Treatment: Rehabilitation  ONSET DATE: 10/23/2021  PERTINENT HISTORY:  Patient is a 78 y.o. female who presents to outpatient physical therapy with a referral for medical diagnosis cervical radiculopathy, cervicalgia. This patient's chief complaints consist of neck pain and stiffness, leading to the following functional deficits: difficulty with water aerobics, turing to look at things, enjoying nature, sleeping. Relevant past medical history and comorbidities include has Status post reverse total shoulder replacement, right; Chronic venous insufficiency; Lymphedema; Diabetes (HCC); Hyperlipidemia; Essential hypertension; Closed fracture of neck of right scapula; Rotator cuff arthropathy, right; DJD (degenerative joint disease) of cervical spine; Pelvic pressure in female; Cystocele with rectocele; Mixed urinary incontinence due to female genital prolapse; Submucous leiomyoma of uterus; Thickened endometrium; Anxiety, generalized; History of depression; Memory difficulty; S/P reverse total shoulder arthroplasty, right; Hx of sleep apnea; Anemia; and  Hemochromatosis carrier on their problem list.  has a past medical history of Anemia, Arthritis, Bipolar affect, depressed (HCC), COPD (chronic obstructive pulmonary disease) (HCC), Diabetes mellitus without complication (HCC), GERD (gastroesophageal reflux disease), Hyperlipidemia, Hypertension, Hypothyroidism, PTSD (post-traumatic stress disorder), Schizoaffective disorder (HCC), Sleep apnea, Stroke (HCC), TIA (transient ischemic attack) (2010), and UTI (urinary tract infection).  has a past surgical history that includes Shoulder surgery (Right, 2020); Cholecystectomy; Breast surgery (Right); Joint replacement; Replacement total knee bilateral (Bilateral); Eye surgery; Cataract extraction w/ intraocular lens  implant, bilateral (Bilateral); Bilateral carpal tunnel release (Bilateral); Colonoscopy; Esophagogastroduodenoscopy; Nasal sinus surgery; Reverse shoulder arthroplasty (Right, 09/02/2020); Breast biopsy; and Total shoulder revision (Right, 10/23/2021).  Patient denies hx of cancer, seizures, heart problems, unexplained weight loss, unexplained changes in bowel or bladder problems, osteoporosis, and spinal surgery  SUBJECTIVE:  SUBJECTIVE STATEMENT: Patient states she felt pretty good after last PT session. She states she thinks her neck is feeling a little better. She states she was having a hard time sleeping because of her neck pain though. She has been doing her SNAG exercise about 12 reps 1-2 a day. She states she found a black Tband at home that she started using for her scapular retractions.   PAIN: NPRS: 3/10 both sides of her neck when she turns each way (worse turning left).   PRECAUTIONS: Fall, no official lifting restriction for R UE per Dr. Dr. Aundria Rud office (phone conversation  05/16/2023)  PATIENT GOALS: "to be able to strengthening my upper arm and get my strength in that where I can hold it up for a while and get that pain under control"   OBJECTIVE  TODAY'S TREATMENT:   Therapeutic exercise: to centralize symptoms and improve ROM, strength, muscular endurance, and activity tolerance required for successful completion of functional activities. -NuStep level 6 using bilateral upper and lower extremities. Seat/handle setting 9/9. For improved extremity mobility, muscular endurance, and activity tolerance; and to induce the analgesic effect of aerobic exercise, stimulate improved joint nutrition, and prepare body structures and systems for following interventions. x 7:41  minutes. Average SPM = 65.  - seated cervical spine rotation SNAG with rolled pillow case, 2x10-20 each direction Mild cuing to perform correctly, pt loses count  Standing scapular retraction with BlackTB across front of body, 1x20 Cuing to choke up on band so hands do not go as far behind body - no longer painful in R shoulder  Manual therapy: to reduce pain and tissue tension, improve range of motion, neuromodulation, in order to promote improved ability to complete functional activities. HOOKLYING STM at bilateral UT, cervical parapsinals, suboccipitals  Manual distraction intermittently up to 30 seconds  Opening mobilizations grade III-IV, 1-2x30 seconds each side at lower, middle, and upper cervical spine between C2-C6.     Pt required multimodal cuing for proper technique and to facilitate improved neuromuscular control, strength, range of motion, and functional ability resulting in improved performance and form.   PATIENT EDUCATION:  Education details: Exercise purpose/form. Self management techniques. Education on diagnosis, prognosis, POC, anatomy and physiology of current condition. Education on HEP including handout. Person educated: Patient Education method: Explanation,  Demonstration, and Handouts Education comprehension: verbalized understanding and needs further education  HOME EXERCISE PROGRAM: Access Code: [facility] URL: [email or web address] Date: 05/30/2023 Prepared by: Norton Blizzard  Exercises - Cervical Retraction with Overpressure  - 2-3 x daily - 2 sets - 10 reps - 1 second hold - Seated Cervical Rotation AROM  - 2 x daily - 2 sets - 10 reps - standing cervical flexion/extension AROM  - 2 x daily - 2 sets - 10 reps - Seated Assisted Cervical Rotation with Towel  - 2 x daily - 1 sets - 20 reps - 5 seconds hold - Staggered Stance Scapular Retraction and Depression with Resistance  - 1 x daily - 2 sets - 20 reps  ASSESSMENT: Today's session focused more on manual therapy for pain relief. She had improved comfort with cervical spine ROM and said her neck felt "wonderful" by end of PT session. She still needs mild cuing for best form for SNAGs but was able to avoid choking herself by accident. Plan for one more visit to reinforce HEP before discharge to self management.  Patient would benefit from continued management of limiting condition by skilled physical therapist to address remaining impairments and  functional limitations to work towards stated goals and return to PLOF or maximal functional independence.   From Initial Evaluation 03/24/2023: Patient is a 78 y.o. female referred to outpatient physical therapy with a medical diagnosis of cervical radiculopathy, cervicalgia who presents with signs and symptoms consistent with chronic neck pain and stiffness. Patient demonstrates deficits in cervical spine ROM and posture that are likely contributing to her neck pain. She does not appear to have radiculopathy but does have right shoulder dysfunction with RTSA revision that likely contributes to her neck pain.Patient presents with significant pain, ROM, joint stiffness, muscle performance (strength/power/endurance), balance, muscle tension,  motor control, and activity tolerance impairments that are limiting ability to complete her usual activities including difficulty with water aerobics, turing to look at things, enjoying nature, sleeping without difficulty. Patient will benefit from skilled physical therapy intervention to address current body structure impairments and activity limitations to improve function and work towards goals set in current POC in order to return to prior level of function or maximal functional improvement.    OBJECTIVE IMPAIRMENTS: decreased activity tolerance, decreased balance, decreased coordination, decreased endurance, decreased knowledge of condition, decreased mobility, decreased ROM, decreased strength, hypomobility, impaired perceived functional ability, increased muscle spasms, impaired flexibility, impaired UE functional use, improper body mechanics, postural dysfunction, obesity, and pain.   ACTIVITY LIMITATIONS: sleeping and moving head, looking at things  PARTICIPATION LIMITATIONS: interpersonal relationship, community activity, and enjoying nature, participating in water aerobics  PERSONAL FACTORS: Age, Past/current experiences, Time since onset of injury/illness/exacerbation, and 3+ comorbidities:   Status post reverse total shoulder replacement, right; Chronic venous insufficiency; Lymphedema; Diabetes (HCC); Hyperlipidemia; Essential hypertension; Closed fracture of neck of right scapula; Rotator cuff arthropathy, right; DJD (degenerative joint disease) of cervical spine; Pelvic pressure in female; Cystocele with rectocele; Mixed urinary incontinence due to female genital prolapse; Submucous leiomyoma of uterus; Thickened endometrium; Anxiety, generalized; History of depression; Memory difficulty; S/P reverse total shoulder arthroplasty, right; Hx of sleep apnea; Anemia; and Hemochromatosis carrier on their problem list.  has a past medical history of Anemia, Arthritis, Bipolar affect, depressed  (HCC), COPD (chronic obstructive pulmonary disease) (HCC), Diabetes mellitus without complication (HCC), GERD (gastroesophageal reflux disease), Hyperlipidemia, Hypertension, Hypothyroidism, PTSD (post-traumatic stress disorder), Schizoaffective disorder (HCC), Sleep apnea, Stroke (HCC), TIA (transient ischemic attack) (2010), and UTI (urinary tract infection).  has a past surgical history that includes Shoulder surgery (Right, 2020); Cholecystectomy; Breast surgery (Right); Joint replacement; Replacement total knee bilateral (Bilateral); Eye surgery; Cataract extraction w/ intraocular lens  implant, bilateral (Bilateral); Bilateral carpal tunnel release (Bilateral); Colonoscopy; Esophagogastroduodenoscopy; Nasal sinus surgery; Reverse shoulder arthroplasty (Right, 09/02/2020); Breast biopsy; and Total shoulder revision (Right, 10/23/2021) are also affecting patient's functional outcome.   REHAB POTENTIAL: Fair due to chronicity of problem, comorbid conditions  CLINICAL DECISION MAKING: Evolving/moderate complexity  EVALUATION COMPLEXITY: Moderate   GOALS: Goals reviewed with patient? No  SHORT TERM GOALS: Target date: 04/07/2023  Patient will be independent with initial home exercise program for self-management of symptoms. Baseline: Initial HEP provided at IE (03/24/23); participating once a day (05/30/2023);  Goal status: MET   LONG TERM GOALS: Target date: 06/16/2023  Patient will be independent with a long-term home exercise program for self-management of symptoms.  Baseline: Initial HEP provided at IE (03/24/23); participating once a day (05/30/2023);  Goal status: In-progress  2.  Patient will demonstrate improved FOTO by equal or greater than 10 points by visit #10 to demonstrate improvement in overall condition and self-reported functional ability.  Baseline: to be  measured at visit 2 as appropriate - tech malfunction at IE (03/24/23); 39 at visit #2 (04/14/2023); 46 at visit #5  (04/26/2023); 51 at visit #10 (05/30/2023);  Goal status: MET  3.  Patient will demonstrate the ability to rotate cervical spine equal or greater than 60 degrees each direction to improve her ability to view her surroundings and enjoy nature.  Baseline: R/L 43/30 (03/24/23); R/L 54/45 (05/30/23);  Goal status: In-progress  4.  Patient will score equal or greater than 39 seconds on the Deep Neck Flexor Endurance Test (DNFET) to match norms for pain free individuals in her age group (norm +1 standard deviation) and improve her ability to enjoy nature and participate in water aerobics.  Baseline: to be tested visit 2 as appropriate (03/24/23); 90 seconds (04/13/2023);  Goal status: MET  5.  Patient will demonstrate improvement in Patient Specific Functional Scale (PSFS) of equal or greater than 3 points to reflect clinically significant improvement in patient's most valued functional activities. Baseline: to be measured visit 2 as appropriate (03/24/23); 3.7/10 (04/14/2023); 9/10 at visit 10 (05/30/2023); Goal status: MET  6.  Patient will report decrease in pain during functional activities to equal or less than 3/10 to improve her ability to sleep, view her surroundings, and participate in water aerobics.  Baseline: up to 7/10 (03/24/23); up to 6/10 but less frequent (05/30/2023);  Goal status: In-progress    PLAN:  PT FREQUENCY: 1-2x/week  PT DURATION: 8-12 weeks  PLANNED INTERVENTIONS: 97164- PT Re-evaluation, 97110-Therapeutic exercises, 97530- Therapeutic activity, [id number]- Neuromuscular re-education, 97140- Manual therapy, [id number]- Aquatic Therapy, 97014- Electrical stimulation (unattended), [phone]- Electrical stimulation (manual), Patient/Family education, Balance training, Dry Needling, Joint mobilization, Spinal mobilization, Cryotherapy, and Moist heat  PLAN FOR NEXT SESSION: update HEP as appropriate, address joint restrictions and soft tissue tension/shortness, progressive  postural/UE/functional strengthening and ROM exercises. Education, manual therapy and dry needling as needed.   Luretha Murphy. Ilsa Iha, PT, DPT 06/06/23, 4:28 PM  [facility] [address] [place], [place] [address] P: [phone] I F: [phone]

## 2023-06-08 ENCOUNTER — Ambulatory Visit: Payer: 59 | Admitting: Physical Therapy

## 2023-06-14 ENCOUNTER — Encounter: Payer: 59 | Admitting: Physical Therapy

## 2023-06-20 ENCOUNTER — Encounter: Payer: Self-pay | Admitting: Physical Therapy

## 2023-06-20 ENCOUNTER — Ambulatory Visit: Payer: 59 | Attending: Physical Medicine & Rehabilitation | Admitting: Physical Therapy

## 2023-06-20 DIAGNOSIS — R293 Abnormal posture: Secondary | ICD-10-CM | POA: Insufficient documentation

## 2023-06-20 DIAGNOSIS — M542 Cervicalgia: Secondary | ICD-10-CM | POA: Insufficient documentation

## 2023-06-20 DIAGNOSIS — M6281 Muscle weakness (generalized): Secondary | ICD-10-CM | POA: Diagnosis present

## 2023-06-20 NOTE — Addendum Note (Signed)
 Addended by: Alleen Isle R on: 06/20/2023 05:12 PM   Modules accepted: Orders

## 2023-06-20 NOTE — Therapy (Signed)
 OUTPATIENT PHYSICAL THERAPY TREATMENT / DISCHARGE SUMMARY Dates of reporting from 03/24/2023 to 06/20/2023   Patient Name: Angela Fernandez MRN: [id number] DOB:07-Jun-1945, 78 y.o., female Today's Date: 06/20/2023  END OF SESSION:  PT End of Session - 06/20/23 1604     Visit Number 12    Number of Visits 13    Date for PT Re-Evaluation 06/16/23    Authorization Type [employer] reporting period from 05/30/2023    Progress Note Due on Visit 13    PT Start Time 1600    PT Stop Time 1640    PT Time Calculation (min) 40 min    Activity Tolerance Patient tolerated treatment well    Behavior During Therapy WFL for tasks assessed/performed            PCP: Eartha Gold, MD  REFERRING PROVIDER: Jolie Neat, MD  REFERRING DIAG: cervical radiculopathy, cervicalgia  THERAPY DIAG:  Cervicalgia  Abnormal posture  Muscle weakness (generalized)  Rationale for Evaluation and Treatment: Rehabilitation  ONSET DATE: 10/23/2021  PERTINENT HISTORY:  Patient is a 78 y.o. female who presents to outpatient physical therapy with a referral for medical diagnosis cervical radiculopathy, cervicalgia. This patient's chief complaints consist of neck pain and stiffness, leading to the following functional deficits: difficulty with water aerobics, turing to look at things, enjoying nature, sleeping. Relevant past medical history and comorbidities include has Status post reverse total shoulder replacement, right; Chronic venous insufficiency; Lymphedema; Diabetes (HCC); Hyperlipidemia; Essential hypertension; Closed fracture of neck of right scapula; Rotator cuff arthropathy, right; DJD (degenerative joint disease) of cervical spine; Pelvic pressure in female; Cystocele with rectocele; Mixed urinary incontinence due to female genital prolapse; Submucous leiomyoma of uterus; Thickened endometrium; Anxiety, generalized; History of depression; Memory difficulty; S/P reverse total  shoulder arthroplasty, right; Hx of sleep apnea; Anemia; and Hemochromatosis carrier on their problem list.  has a past medical history of Anemia, Arthritis, Bipolar affect, depressed (HCC), COPD (chronic obstructive pulmonary disease) (HCC), Diabetes mellitus without complication (HCC), GERD (gastroesophageal reflux disease), Hyperlipidemia, Hypertension, Hypothyroidism, PTSD (post-traumatic stress disorder), Schizoaffective disorder (HCC), Sleep apnea, Stroke (HCC), TIA (transient ischemic attack) (2010), and UTI (urinary tract infection).  has a past surgical history that includes Shoulder surgery (Right, 2020); Cholecystectomy; Breast surgery (Right); Joint replacement; Replacement total knee bilateral (Bilateral); Eye surgery; Cataract extraction w/ intraocular lens  implant, bilateral (Bilateral); Bilateral carpal tunnel release (Bilateral); Colonoscopy; Esophagogastroduodenoscopy; Nasal sinus surgery; Reverse shoulder arthroplasty (Right, 09/02/2020); Breast biopsy; and Total shoulder revision (Right, 10/23/2021).  Patient denies hx of cancer, seizures, heart problems, unexplained weight loss, unexplained changes in bowel or bladder problems, osteoporosis, and spinal surgery  SUBJECTIVE:  SUBJECTIVE STATEMENT: Patient states she felt really good after last PT session, but she cannot remember how long that lasted. She states her neck doesn't hurt as much when she does her HEP and she has noticed it is not hurting as much during water aerobics class. She says she does not know what is wrong with it. She understands today is her last PT appointment.   PAIN: NPRS: 0/10 in her neck  PRECAUTIONS: Fall, no official lifting restriction for R UE per Dr. Dr. Hiram Lukes office (phone conversation 05/16/2023)  PATIENT  GOALS: "to be able to strengthening my upper arm and get my strength in that where I can hold it up for a while and get that pain under control"   OBJECTIVE  SELF-REPORTED FUNCTION FOTO score: 48/100 (neck questionnaire)  SELF-REPORTED FUNCTION Patient Specific Functional Scale (PSFS)  vacuuming: 10 Turning head: 6 sleeping: 7 Average: 7.7/10   SPINE MOTION CERVICAL SPINE AROM *Indicates pain Rotation:  R 60  L 50   TODAY'S TREATMENT:   Therapeutic exercise: to centralize symptoms and improve ROM, strength, muscular endurance, and activity tolerance required for successful completion of functional activities. -NuStep level 6 using bilateral upper and lower extremities. Seat/handle setting 9/9. For improved extremity mobility, muscular endurance, and activity tolerance; and to induce the analgesic effect of aerobic exercise, stimulate improved joint nutrition, and prepare body structures and systems for following interventions. x 7:41  minutes. Average SPM = 65.  seated cervical spine rotation SNAG with rolled pillow case, 1x40 to the left, 1x20 to the right (R shoulder painful, which limited reps) Mild cuing for optimal hand placement on pillow case.   Standing scapular retraction with BlackTB across front of body, 1x35 Cuing to choke up on band so hands do not go as far behind body  Manual therapy: to reduce pain and tissue tension, improve range of motion, neuromodulation, in order to promote improved ability to complete functional activities. HOOKLYING STM at bilateral UT, cervical parapsinals, suboccipitals (R > L)  Pt required multimodal cuing for proper technique and to facilitate improved neuromuscular control, strength, range of motion, and functional ability resulting in improved performance and form.   PATIENT EDUCATION:  Education details: Exercise purpose/form. Self management techniques. Discharge recommendations. HEP Person educated: Patient Education method:  Explanation, Demonstration, and Handouts Education comprehension: verbalized understanding  HOME EXERCISE PROGRAM: Access Code: [facility] URL: [email or web address] Date: 05/30/2023 Prepared by: Alleen [place]  Exercises - Cervical Retraction with Overpressure  - 2-3 x daily - 2 sets - 10 reps - 1 second hold - Seated Cervical Rotation AROM  - 2 x daily - 2 sets - 10 reps - standing cervical flexion/extension AROM  - 2 x daily - 2 sets - 10 reps - Seated Assisted Cervical Rotation with Towel  - 2 x daily - 1 sets - 20 reps - 5 seconds hold - Staggered Stance Scapular Retraction and Depression with Resistance  - 1 x daily - 2 sets - 20 reps  ASSESSMENT: Patient has attended 12 physical therapy sessions since starting current episode of care on 03/23/2024. She has met her short term goal and met 3/6 long term goals including her HEP, PSFS, and deep neck flexor endurance goal. She has made some progress or has nearly met her remaining goals, and but is now discharging due to lack of continued meaningful improvement. She has received a long term HEP for self management and is now discharged to self care.   From Initial Evaluation 03/24/2023: Patient is a  78 y.o. female referred to outpatient physical therapy with a medical diagnosis of cervical radiculopathy, cervicalgia who presents with signs and symptoms consistent with chronic neck pain and stiffness. Patient demonstrates deficits in cervical spine ROM and posture that are likely contributing to her neck pain. She does not appear to have radiculopathy but does have right shoulder dysfunction with RTSA revision that likely contributes to her neck pain.Patient presents with significant pain, ROM, joint stiffness, muscle performance (strength/power/endurance), balance, muscle tension, motor control, and activity tolerance impairments that are limiting ability to complete her usual activities including difficulty with water aerobics, turing  to look at things, enjoying nature, sleeping without difficulty. Patient will benefit from skilled physical therapy intervention to address current body structure impairments and activity limitations to improve function and work towards goals set in current POC in order to return to prior level of function or maximal functional improvement.    OBJECTIVE IMPAIRMENTS: decreased activity tolerance, decreased balance, decreased coordination, decreased endurance, decreased knowledge of condition, decreased mobility, decreased ROM, decreased strength, hypomobility, impaired perceived functional ability, increased muscle spasms, impaired flexibility, impaired UE functional use, improper body mechanics, postural dysfunction, obesity, and pain.   ACTIVITY LIMITATIONS: sleeping and moving head, looking at things  PARTICIPATION LIMITATIONS: interpersonal relationship, community activity, and enjoying nature, participating in water aerobics  PERSONAL FACTORS: Age, Past/current experiences, Time since onset of injury/illness/exacerbation, and 3+ comorbidities:   Status post reverse total shoulder replacement, right; Chronic venous insufficiency; Lymphedema; Diabetes (HCC); Hyperlipidemia; Essential hypertension; Closed fracture of neck of right scapula; Rotator cuff arthropathy, right; DJD (degenerative joint disease) of cervical spine; Pelvic pressure in female; Cystocele with rectocele; Mixed urinary incontinence due to female genital prolapse; Submucous leiomyoma of uterus; Thickened endometrium; Anxiety, generalized; History of depression; Memory difficulty; S/P reverse total shoulder arthroplasty, right; Hx of sleep apnea; Anemia; and Hemochromatosis carrier on their problem list.  has a past medical history of Anemia, Arthritis, Bipolar affect, depressed (HCC), COPD (chronic obstructive pulmonary disease) (HCC), Diabetes mellitus without complication (HCC), GERD (gastroesophageal reflux disease), Hyperlipidemia,  Hypertension, Hypothyroidism, PTSD (post-traumatic stress disorder), Schizoaffective disorder (HCC), Sleep apnea, Stroke (HCC), TIA (transient ischemic attack) (2010), and UTI (urinary tract infection).  has a past surgical history that includes Shoulder surgery (Right, 2020); Cholecystectomy; Breast surgery (Right); Joint replacement; Replacement total knee bilateral (Bilateral); Eye surgery; Cataract extraction w/ intraocular lens  implant, bilateral (Bilateral); Bilateral carpal tunnel release (Bilateral); Colonoscopy; Esophagogastroduodenoscopy; Nasal sinus surgery; Reverse shoulder arthroplasty (Right, 09/02/2020); Breast biopsy; and Total shoulder revision (Right, 10/23/2021) are also affecting patient's functional outcome.   REHAB POTENTIAL: Fair due to chronicity of problem, comorbid conditions  CLINICAL DECISION MAKING: Evolving/moderate complexity  EVALUATION COMPLEXITY: Moderate   GOALS: Goals reviewed with patient? No  SHORT TERM GOALS: Target date: 04/07/2023  Patient will be independent with initial home exercise program for self-management of symptoms. Baseline: Initial HEP provided at IE (03/24/23); participating once a day (05/30/2023);  Goal status: MET   LONG TERM GOALS: Target date: 06/16/2023  Patient will be independent with a long-term home exercise program for self-management of symptoms.  Baseline: Initial HEP provided at IE (03/24/23); participating once a day (05/30/2023); performs all exercises at least 3 times a week (06/20/2023);  Goal status: MET  2.  Patient will demonstrate improved FOTO by equal or greater than 10 points by visit #10 to demonstrate improvement in overall condition and self-reported functional ability.  Baseline: to be measured at visit 2 as appropriate - tech malfunction at IE (03/24/23); 39 at  visit #2 (04/14/2023); 46 at visit #5 (04/26/2023); 51 at visit #10 (05/30/2023); 48 at visit #12 (06/20/2023);  Goal status: Nearly MET  3.  Patient will  demonstrate the ability to rotate cervical spine equal or greater than 60 degrees each direction to improve her ability to view her surroundings and enjoy nature.  Baseline: R/L 43/30 (03/24/23); R/L 54/45  (05/30/23); R/L 60/50 (06/20/2023);  Goal status: Nearly MET  4.  Patient will score equal or greater than 39 seconds on the Deep Neck Flexor Endurance Test (DNFET) to match norms for pain free individuals in her age group (norm +1 standard deviation) and improve her ability to enjoy nature and participate in water aerobics.  Baseline: to be tested visit 2 as appropriate (03/24/23); 90 seconds (04/13/2023);  Goal status: MET  5.  Patient will demonstrate improvement in Patient Specific Functional Scale (PSFS) of equal or greater than 3 points to reflect clinically significant improvement in patient's most valued functional activities. Baseline: to be measured visit 2 as appropriate (03/24/23); 3.7/10 (04/14/2023); 9/10 at visit 10 (05/30/2023); 7.7/10 at visit #12 (06/20/2023);  Goal status: MET  6.  Patient will report decrease in pain during functional activities to equal or less than 3/10 to improve her ability to sleep, view her surroundings, and participate in water aerobics.  Baseline: up to 7/10 (03/24/23); up to 6/10 but less frequent (05/30/2023); up to 6-7/10 in the last 2 weeks, but not as frequently as it used to be (06/20/2023);  Goal status: Partially met    PLAN:  PT FREQUENCY: 1-2x/week  PT DURATION: 8-12 weeks  PLANNED INTERVENTIONS: 97164- PT Re-evaluation, 97110-Therapeutic exercises, 97530- Therapeutic activity, [id number]- Neuromuscular re-education, 97140- Manual therapy, [id number]- Aquatic Therapy, 97014- Electrical stimulation (unattended), [id number]- Electrical stimulation (manual), Patient/Family education, Balance training, Dry Needling, Joint mobilization, Spinal mobilization, Cryotherapy, and Moist heat  PLAN FOR NEXT SESSION: patient is now discharged  Carilyn Charles. Artemio Larry, PT,  DPT 06/20/23, 5:10 PM  [facility] [address] [place], [place] [address] P: [phone] I F: [phone]

## 2023-06-22 ENCOUNTER — Ambulatory Visit (INDEPENDENT_AMBULATORY_CARE_PROVIDER_SITE_OTHER): Payer: 59 | Admitting: Licensed Clinical Social Worker

## 2023-06-22 DIAGNOSIS — F4310 Post-traumatic stress disorder, unspecified: Secondary | ICD-10-CM | POA: Diagnosis not present

## 2023-06-22 DIAGNOSIS — F259 Schizoaffective disorder, unspecified: Secondary | ICD-10-CM

## 2023-06-22 DIAGNOSIS — F411 Generalized anxiety disorder: Secondary | ICD-10-CM | POA: Diagnosis not present

## 2023-06-22 NOTE — Progress Notes (Signed)
   THERAPIST PROGRESS NOTE  Session Time: 10:01am-10:45am   Participation Level: Active  Behavioral Response: CasualAlertAnxious and Depressed  Type of Therapy: Individual Therapy  Treatment Goals addressed: STG: [name] will identify situations, thoughts, and feelings that trigger internal anger, and/or angry/aggressive actions as evidenced by self-report    Reduce overall frequency, intensity and duration of anxiety so that daily functioning is not impaired per pt self report 3 out of 5 sessions.   Recognize, accept and cope with feelings of depression per pt self report 3 out of 5 sessions  ProgressTowards Goals: Progressing  Interventions: CBT, Supportive, and Reframing, Assertive Communication  Summary: Angela Fernandez is a 78 y.o. female who presents with symptoms of anxiety. Patient reports uncontrollable worry, irritability, nightmares, and racing thoughts. Pt was oriented times 5. Pt was cooperative and engaged. Pt denies SI/HI/AVH.   Patient utilized therapeutic space to process hope to increase physical activity through [employer] classes.  Patient processed difficulties with transportation and ways in which she is trying to prioritize self-care despite barriers.  Discussed ways in which the relationship with her granddaughter has improved and processed ways in which patient can work towards a healthier relationship with her daughter.  Patient processed ways in which she has utilized assertive communication in the past and her feelings of regret and misplaced guilt.  Addressed ways in which patient can practice forgiving herself for past mistakes and continue to work through [employer] guilt."  Patient identified herself as a bad person.  Discovered the root of this behavior as a result of childhood trauma citing on acceptance and judgment from adults in her life.  Began to address ways in which patient can challenge this narrative.   Suicidal/Homicidal: Nowithout  intent/plan  Therapist Response: Clinician utilized active and supportive reflection to create a safe environment for patient to process recent events. Clinician assessed for current stressors, symptoms, safety since last session.  Worked with patient to address ways in which she can utilize assertive communication to establish healthy boundaries.  Worked through CBT techniques to challenge unhelpful thoughts as a result of childhood trauma.  Plan: Return again in 2 weeks.  Diagnosis: Schizoaffective disorder, unspecified type (HCC)  PTSD (post-traumatic stress disorder)  Anxiety state   Collaboration of Care: AEB psychiatrist can access notes and cln. Will review psychiatrists' notes. Check in with the patient and will see LCSW per availability. Patient agreed with treatment recommendations.  Patient/Guardian was advised Release of Information must be obtained prior to any record release in order to collaborate their care with an outside provider. Patient/Guardian was advised if they have not already done so to contact the registration department to sign all necessary forms in order for [place] to release information regarding their care.   Consent: Patient/Guardian gives verbal consent for treatment and assignment of benefits for services provided during this visit. Patient/Guardian expressed understanding and agreed to proceed.   Dereck Leep, LCSW 06/22/2023

## 2023-06-24 ENCOUNTER — Telehealth: Payer: Self-pay

## 2023-06-24 NOTE — Telephone Encounter (Signed)
called the pharmacy and spoke with stephanie. she states that the patient last pick up her last refill on the 1-17 for #90 day supply. that pt should have enough until 4-17

## 2023-06-24 NOTE — Telephone Encounter (Signed)
called and spoke with patient, she was told that according to the pharmacy she should have enough medication to last until 4-17. pt states that her daughter Joice Lofts carl said that she does not have any medication left. I ask if her daughter was at the house and she said no.  I asked her if she would ask her daughter to call our office so I can explain what is going on if I could get her verbal consent to speak with her daughter and patient gave verbal consent to speak with amber carl.

## 2023-06-24 NOTE — Telephone Encounter (Signed)
pt left a message that she needed a refill on the pristiq. pt was last seenon 1-16 next appt 3-24.

## 2023-07-05 ENCOUNTER — Ambulatory Visit: Payer: Self-pay | Admitting: Licensed Clinical Social Worker

## 2023-07-08 ENCOUNTER — Telehealth: Payer: Self-pay | Admitting: *Deleted

## 2023-07-08 NOTE — Telephone Encounter (Signed)
 She is now on the creon and doing good.

## 2023-07-18 ENCOUNTER — Ambulatory Visit (INDEPENDENT_AMBULATORY_CARE_PROVIDER_SITE_OTHER): Payer: 59 | Admitting: Licensed Clinical Social Worker

## 2023-07-18 DIAGNOSIS — F411 Generalized anxiety disorder: Secondary | ICD-10-CM | POA: Diagnosis not present

## 2023-07-18 DIAGNOSIS — F259 Schizoaffective disorder, unspecified: Secondary | ICD-10-CM

## 2023-07-18 DIAGNOSIS — F4310 Post-traumatic stress disorder, unspecified: Secondary | ICD-10-CM

## 2023-07-18 NOTE — Progress Notes (Signed)
 THERAPIST PROGRESS NOTE  Session Time: 10:02am-10:48am  Participation Level: Active  Behavioral Response: CasualAlertEuthymic  Type of Therapy: Individual Therapy  Treatment Goals addressed:         Problem: Anger Management     Dates: Start:  03/16/23       Disciplines: Interdisciplinary, PROVIDER        Goal: LTG: Angela Fernandez will reduce the amount of anger-related incidents/outbursts by 50% as evidenced by self-report        Goal note from Counselor 07/18/2023 by Dereck Leep, LCSW     07/18/2023: "I've gotten more passive aggressive." Will continue to process recent experiences and challenge behaviors.                     Goal: STG: Angela Fernandez will identify situations, thoughts, and feelings that trigger internal anger, and/or angry/aggressive actions as evidenced by self-report                 Goal note from Counselor 07/18/2023 by Dereck Leep, LCSW     07/18/2023: 50% progress "I'm controlling it [her situation] a lot more." Identifies she has not had an outburst since beginning therapy.                           Template: Depression (OP)         Problem: OP Depression               Goal: LTG: Reduce frequency, intensity, and duration of depression symptoms so that daily functioning is improved                 Goal note from Counselor 07/18/2023 by Dereck Leep, LCSW     07/18/23: 40% progress "I'm going through something that is bothering me because I cannot remember things." Pt identified a desire to work on problem solving barriers to patient engaging in self care such as painting and moving.                    Goal: STG: Angela Fernandez will identify cognitive patterns and beliefs that support depression                 Goal note from Counselor 07/18/2023 by Dereck Leep, LCSW     07/18/23: 40% progress "I've been working on that a lot."                     Goal: STG: Angela Fernandez will practice behavioral activation skills 2 times  per week for the next 8 weeks                    Goal note from Counselor 07/18/2023 by Dereck Leep, LCSW     07/18/23: Patient reports she is participating in swim classes 2x/week but reports she is struggling to keep up with painting due to limited supplies.                     Goal: STG - Misc 1 (Resolved)     Dates: Start:  03/16/23    Expected End:  08/12/23    Resolved:  07/18/23    Description: Pt reports she would like to expand her support system. Pt will accomplish this by calling friends starting out once a month form reports 0 times a month.     Counselor  Problem: Self Esteem:     Dates: Start:  03/16/23       Description:      Disciplines: Counselor        Goal: STG - Other Self-Esteem Goal (Specify)     Dates: Start:  03/16/23    Expected End:  08/12/23       Description: Pt will report improvements in self esteem    Disciplines: Counselor                   Goal note from Counselor 07/18/2023 by Renee Ramus B, LCSW     07/18/23: 25% progress "I've been working hard on my self esteem.I tell myself I have a right to do what I need."                           ProgressTowards Goals: Progressing  Interventions: CBT, Strength-based, Assertiveness Training, Supportive, and Reframing  Summary: Angela Fernandez is a 78 y.o. female who presents with symptoms of anxiety. Patient reports uncontrollable worry, irritability, nightmares, and racing thoughts. Pt was oriented times 5. Pt was cooperative and engaged. Pt denies SI/HI/AVH.  The patient used the therapeutic space to address and improve her relationships with her daughter and granddaughter. She expressed concern that her medications may be making her feel tired, especially as her arthritis worsens, which has led her to stop painting. Together with her clinician, she explored ways to engage in hobbies and activities by addressing barriers.  During the session, the  clinician reviewed the patient's progress in therapy (see above). Discussed the treatment plan and made adjustments as the patient deemed necessary. The patient noted improvements in her communication skills and in managing her anger symptoms. However, she recognized the need to work on reducing the frequency and severity of her depression symptoms. The clinician assisted her in using Cognitive Behavioral Therapy (CBT) techniques to reframe her perspective, helping her focus on what she can control in her situation rather than becoming overwhelmed by uncontrollable factors.    Suicidal/Homicidal: Nowithout intent/plan  Therapist Response: Clinician utilized active and supportive reflection to create a safe environment for patient to process recent events. Clinician assessed for current stressors, symptoms, safety since last session.  Worked with patient to address ways in which she can utilize assertive communication to establish healthy boundaries.  Worked through CBT techniques to challenge unhelpful thoughts as a result of childhood trauma.    Plan: Return again in 3 weeks.  Patient reports progress and provided consent to pushing out  frequency of sessions  Diagnosis: Schizoaffective disorder, unspecified type (HCC)  PTSD (post-traumatic stress disorder)  Anxiety state   Collaboration of Care: AEB psychiatrist can access notes and cln. Will review psychiatrists' notes. Check in with the patient and will see LCSW per availability. Patient agreed with treatment recommendations.   Patient/Guardian was advised Release of Information must be obtained prior to any record release in order to collaborate their care with an outside provider. Patient/Guardian was advised if they have not already done so to contact the registration department to sign all necessary forms in order for [place] to release information regarding their care.   Consent: Patient/Guardian gives verbal consent for treatment and  assignment of benefits for services provided during this visit. Patient/Guardian expressed understanding and agreed to proceed.   Dereck Leep, LCSW 07/18/2023

## 2023-07-21 ENCOUNTER — Other Ambulatory Visit: Payer: Self-pay | Admitting: Psychiatry
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
## 2023-08-01 ENCOUNTER — Ambulatory Visit (INDEPENDENT_AMBULATORY_CARE_PROVIDER_SITE_OTHER): Payer: 59 | Admitting: Psychiatry

## 2023-08-01 ENCOUNTER — Encounter: Payer: Self-pay | Admitting: Psychiatry

## 2023-08-01 VITALS — BP 124/86 | HR 83 | Temp 98.3°F | Ht 65.0 in | Wt 202.4 lb

## 2023-08-01 DIAGNOSIS — F259 Schizoaffective disorder, unspecified: Secondary | ICD-10-CM

## 2023-08-01 DIAGNOSIS — F4310 Post-traumatic stress disorder, unspecified: Secondary | ICD-10-CM

## 2023-08-01 DIAGNOSIS — F411 Generalized anxiety disorder: Secondary | ICD-10-CM

## 2023-08-01 DIAGNOSIS — F1320 Sedative, hypnotic or anxiolytic dependence, uncomplicated: Secondary | ICD-10-CM

## 2023-08-01 DIAGNOSIS — Z79899 Other long term (current) drug therapy: Secondary | ICD-10-CM

## 2023-08-01 MED ORDER — LURASIDONE HCL 120 MG PO TABS: 1.0000 | ORAL_TABLET | Freq: Every day | ORAL | 1 refills | Status: DC

## 2023-08-01 MED ORDER — BUPROPION HCL ER (SR) 200 MG PO TB12: 200.0000 mg | ORAL_TABLET | Freq: Two times a day (BID) | ORAL | 1 refills | Status: DC

## 2023-08-01 MED ORDER — CLONAZEPAM 1 MG PO TABS: 1.0000 mg | ORAL_TABLET | Freq: Two times a day (BID) | ORAL | 1 refills | Status: DC | PRN

## 2023-08-01 MED ORDER — ZOLPIDEM TARTRATE 5 MG PO TABS
5.0000 mg | ORAL_TABLET | Freq: Every evening | ORAL | 1 refills | Status: DC | PRN
Start: 2023-08-08 — End: 2023-10-14

## 2023-08-01 MED ORDER — DESVENLAFAXINE SUCCINATE ER 50 MG PO TB24: 50.0000 mg | ORAL_TABLET | Freq: Every day | ORAL | 1 refills | Status: DC

## 2023-08-04 ENCOUNTER — Ambulatory Visit (INDEPENDENT_AMBULATORY_CARE_PROVIDER_SITE_OTHER): Payer: Self-pay | Admitting: Licensed Clinical Social Worker

## 2023-08-04 DIAGNOSIS — F259 Schizoaffective disorder, unspecified: Secondary | ICD-10-CM | POA: Diagnosis not present

## 2023-08-04 DIAGNOSIS — F411 Generalized anxiety disorder: Secondary | ICD-10-CM | POA: Diagnosis not present

## 2023-08-04 DIAGNOSIS — F4310 Post-traumatic stress disorder, unspecified: Secondary | ICD-10-CM

## 2023-08-04 NOTE — Progress Notes (Signed)
 THERAPIST PROGRESS NOTE  Session Time: 9:04am-9:44am  Participation Level: Active  Behavioral Response: CasualAlertEuthymic  Type of Therapy: Individual Therapy  Treatment Goals addressed: Treatment Goals addressed:                        Problem: Anger Management      Dates: Start:  03/16/23        Disciplines: Interdisciplinary, PROVIDER                  Goal: LTG: Jessic will reduce the amount of anger-related incidents/outbursts by 50% as evidenced by self-report           Goal note from Counselor 07/18/2023 by Dereck Leep, LCSW     07/18/2023: "I've gotten more passive aggressive." Will continue to process recent experiences and challenge behaviors.                               Goal: STG: Tomicka will identify situations, thoughts, and feelings that trigger internal anger, and/or angry/aggressive actions as evidenced by self-report                             Goal note from Counselor 07/18/2023 by Dereck Leep, LCSW     07/18/2023: 50% progress "I'm controlling it [her situation] a lot more." Identifies she has not had an outburst since beginning therapy.                                                           Problem: Self Esteem:      Dates: Start:  03/16/23        Description:       Disciplines: Counselor                   Goal: STG - Other Self-Esteem Goal (Specify)      Dates: Start:  03/16/23    Expected End:  08/12/23        Description: Pt will report improvements in self esteem     Disciplines: Counselor                          Goal note from Counselor 07/18/2023 by Renee Ramus B, LCSW     07/18/23: 25% progress "I've been working hard on my self esteem.I tell myself I have a right to do what I need."               ProgressTowards Goals: Progressing  Interventions: Assertiveness Training, Supportive, and Other: Mindfulness  Summary: Ivy Puryear is a 78 y.o. female who presents with symptoms of anxiety.  Patient reports uncontrollable worry, irritability, nightmares, and racing thoughts. Pt was oriented times 5. Pt was cooperative and engaged. Pt denies SI/HI/AVH.   Patient utilized therapeutic space to process recent psychiatry visit citing med changes and hopes to learn coping skills to manage her anxiety.  Patient identified difficulties with medication compliance in the evenings.  Clinician worked with patient on problem solving ways she can remember to take her medications through use of timers.  Patient identified a trigger for her anxiety is often miscommunication between her, her daughter,  and granddaughter.  Patient reflected on recent distressing incident citing increased anxiety due to replaying events in her mind.  Patient identified negative cognitions such as "I am experiencing barriers to feeling like I belong "and "I feel I am always doing something wrong with them."  Addressed ways in which patient can identify controllable factors as well as utilize assertive communication to address passive tendencies of others within her family.  Patient expressed excitement about joining social media and establishing new social connections.  Expressed hope about connecting with estranged family members.  Clinician educated patient on coping skills such as acupressure breathing and constructing a container.  Clinician also utilized bilateral tapping to instill positive connections which patient sided she will use at home to manage her anxious symptoms.  Suicidal/Homicidal: Nowithout intent/plan  Therapist Response: Clinician utilized active and supportive reflection to create a safe environment for patient to process recent events. Clinician assessed for current stressors, symptoms, safety since last session. Worked with patient to address ways in which she can utilize assertive communication to establish healthy boundaries.  Educated patient on coping skills to manage anxious symptoms such as  acupressure breathing and constructing a container.  Plan: Return again in 3 weeks.  Diagnosis: Schizoaffective disorder, unspecified type (HCC)  PTSD (post-traumatic stress disorder)  Anxiety state   Collaboration of Care: AEB psychiatrist can access notes and cln. Will review psychiatrists' notes. Check in with the patient and will see LCSW per availability. Patient agreed with treatment recommendations.   Patient/Guardian was advised Release of Information must be obtained prior to any record release in order to collaborate their care with an outside provider. Patient/Guardian was advised if they have not already done so to contact the registration department to sign all necessary forms in order for [place] to release information regarding their care.   Consent: Patient/Guardian gives verbal consent for treatment and assignment of benefits for services provided during this visit. Patient/Guardian expressed understanding and agreed to proceed.   Dereck Leep, LCSW 08/04/2023

## 2023-08-18 ENCOUNTER — Ambulatory Visit: Payer: Self-pay | Admitting: Licensed Clinical Social Worker

## 2023-08-22 ENCOUNTER — Inpatient Hospital Stay: Payer: 59 | Attending: Oncology

## 2023-08-22 DIAGNOSIS — D649 Anemia, unspecified: Secondary | ICD-10-CM

## 2023-08-22 LAB — CMP (CANCER CENTER ONLY)
ALT: 34 U/L (ref 0–44)
AST: 35 U/L (ref 15–41)
Albumin: 4.1 g/dL (ref 3.5–5.0)
Alkaline Phosphatase: 20 U/L — ABNORMAL LOW (ref 38–126)
Anion gap: 10 (ref 5–15)
BUN: 21 mg/dL (ref 8–23)
CO2: 25 mmol/L (ref 22–32)
Calcium: 9.3 mg/dL (ref 8.9–10.3)
Chloride: 104 mmol/L (ref 98–111)
Creatinine: 1.06 mg/dL — ABNORMAL HIGH (ref 0.44–1.00)
GFR, Estimated: 54 mL/min — ABNORMAL LOW (ref >60)
Glucose, Bld: 113 mg/dL — ABNORMAL HIGH (ref 70–99)
Potassium: 4.7 mmol/L (ref 3.5–5.1)
Sodium: 139 mmol/L (ref 135–145)
Total Bilirubin: 0.7 mg/dL (ref 0.0–1.2)
Total Protein: 6.5 g/dL (ref 6.5–8.1)

## 2023-08-22 LAB — CBC WITH DIFFERENTIAL (CANCER CENTER ONLY)
Abs Immature Granulocytes: 0.05 10*3/uL (ref 0.00–0.07)
Basophils Absolute: 0.1 10*3/uL (ref 0.0–0.1)
Basophils Relative: 1 %
Eosinophils Absolute: 0 10*3/uL (ref 0.0–0.5)
Eosinophils Relative: 0 %
HCT: 36.7 % (ref 36.0–46.0)
Hemoglobin: 11.6 g/dL — ABNORMAL LOW (ref 12.0–15.0)
Immature Granulocytes: 1 %
Lymphocytes Relative: 52 %
Lymphs Abs: 4 10*3/uL (ref 0.7–4.0)
MCH: 31.3 pg (ref 26.0–34.0)
MCHC: 31.6 g/dL (ref 30.0–36.0)
MCV: 98.9 fL (ref 80.0–100.0)
Monocytes Absolute: 0.4 10*3/uL (ref 0.1–1.0)
Monocytes Relative: 5 %
Neutro Abs: 3.2 10*3/uL (ref 1.7–7.7)
Neutrophils Relative %: 41 %
Platelet Count: 262 10*3/uL (ref 150–400)
RBC: 3.71 MIL/uL — ABNORMAL LOW (ref 3.87–5.11)
RDW: 13 % (ref 11.5–15.5)
WBC Count: 7.7 10*3/uL (ref 4.0–10.5)
nRBC: 0 % (ref 0.0–0.2)

## 2023-08-22 LAB — IRON AND TIBC
Iron: 87 ug/dL (ref 28–170)
Saturation Ratios: 21 % (ref 10.4–31.8)
TIBC: 414 ug/dL (ref 250–450)
UIBC: 327 ug/dL

## 2023-08-22 LAB — FERRITIN: Ferritin: 572 ng/mL — ABNORMAL HIGH (ref 11–307)

## 2023-08-24 ENCOUNTER — Encounter: Payer: Self-pay | Admitting: Oncology

## 2023-08-24 ENCOUNTER — Inpatient Hospital Stay (HOSPITAL_BASED_OUTPATIENT_CLINIC_OR_DEPARTMENT_OTHER): Payer: 59 | Admitting: Oncology

## 2023-08-24 VITALS — BP 136/72 | HR 58 | Temp 97.4°F | Resp 18 | Wt 201.0 lb

## 2023-08-24 DIAGNOSIS — N1831 Chronic kidney disease, stage 3a: Secondary | ICD-10-CM | POA: Diagnosis not present

## 2023-08-24 DIAGNOSIS — Z148 Genetic carrier of other disease: Secondary | ICD-10-CM

## 2023-08-24 DIAGNOSIS — D649 Anemia, unspecified: Secondary | ICD-10-CM

## 2023-08-24 DIAGNOSIS — N183 Chronic kidney disease, stage 3 unspecified: Secondary | ICD-10-CM | POA: Insufficient documentation

## 2023-08-24 NOTE — Progress Notes (Signed)
 Hematology/Oncology Progress note [phone] [phone]         Patient Care Team: Mick Sell, MD as PCP - General (Infectious Diseases) Rickard Patience, MD as Consulting Physician (Oncology)   CHIEF COMPLAINTS/REASON FOR VISIT:  Heterozygous hemachromatosis mutation, elevated ferritin.   ASSESSMENT & PLAN:   Hemochromatosis carrier heterozygous Cys282Tyr Lab Results  Component Value Date   HGB 11.6 (L) 08/22/2023   TIBC 414 08/22/2023   IRONPCTSAT 21 08/22/2023   FERRITIN 572 (H) 08/22/2023   Avoid alcohol consumption, vitamin C supplementation.  Ferritin is slightly above 500, LFT is normal No need for phlebotomy for now.   Anemia Likely due to CKD.  Observation,     Orders Placed This Encounter  Procedures   CBC with Differential (Cancer Center Only)    Standing Status:   Future    Expected Date:   12/24/2023    Expiration Date:   08/23/2024   CMP (Cancer Center only)    Standing Status:   Future    Expected Date:   12/24/2023    Expiration Date:   08/23/2024   Iron and TIBC    Standing Status:   Future    Expected Date:   12/24/2023    Expiration Date:   08/23/2024   Ferritin    Standing Status:   Future    Expected Date:   12/24/2023    Expiration Date:   08/23/2024   Follow up in 6 months All questions were answered. The patient knows to call the clinic with any problems, questions or concerns.  Rickard Patience, MD, PhD [facility] Hematology Oncology 08/24/2023     HISTORY OF PRESENTING ILLNESS:  Angela Fernandez is a  78 y.o.  female with PMH listed below who was referred to me for anemia Reviewed patient's recent labs that was done.  She has chronic macrocytic anemia since March 2024, Hb progressively decreased to 10.5 on 01/12/2023 10/13/22 iron panel showed ferritin 870, iron saturation 28.  She denies recent chest pain on exertion, shortness of breath on minimal exertion, pre-syncopal episodes, or palpitations She had not noticed any  recent bleeding such as epistaxis, hematuria or hematochezia.   She is on Plaquenil for calcium pyrophosphate deposition disease, she follows up with rheumatology  History of recurrent bladder infection, currently on Trimethoprim long term for prophylaxis.    INTERVAL HISTORY Angela Fernandez is a 78 y.o. female who has above history reviewed by me today presents for follow up visit to review blood work up results.  She take oral iron supplementation.  She was diagnosed with pancreatic insufficiency, was recommended by GI to take Creon.   MEDICAL HISTORY:  Past Medical History:  Diagnosis Date   Anemia    Arthritis    Bipolar affect, depressed (HCC)    COPD (chronic obstructive pulmonary disease) (HCC)    Diabetes mellitus without complication (HCC)    type II   GERD (gastroesophageal reflux disease)    Hyperlipidemia    Hypertension    Hypothyroidism    PTSD (post-traumatic stress disorder)    Schizoaffective disorder (HCC)    Sleep apnea    cpap   Stroke (HCC)    hx of  mini stroke - 2001   TIA (transient ischemic attack) 2010   UTI (urinary tract infection)     SURGICAL HISTORY: Past Surgical History:  Procedure Laterality Date   BILATERAL CARPAL TUNNEL RELEASE Bilateral    BREAST BIOPSY     BREAST SURGERY Right  lumpectomy   CATARACT EXTRACTION W/ INTRAOCULAR LENS  IMPLANT, BILATERAL Bilateral    CHOLECYSTECTOMY     COLONOSCOPY     ESOPHAGOGASTRODUODENOSCOPY     EYE SURGERY     JOINT REPLACEMENT     NASAL SINUS SURGERY     REPLACEMENT TOTAL KNEE BILATERAL Bilateral    REVERSE SHOULDER ARTHROPLASTY Right 09/02/2020   Procedure: REVERSE SHOULDER ARTHROPLASTY;  Surgeon: Christena Flake, MD;  Location: [facility] ORS;  Service: Orthopedics;  Laterality: Right;   SHOULDER SURGERY Right 2020   rotator cuff repair   TOTAL SHOULDER REVISION Right 10/23/2021   Procedure: REVERSE SHOULDER REVISION;  Surgeon: Yolonda Kida, MD;  Location: [facility];  Service: Orthopedics;   Laterality: Right;  180    SOCIAL HISTORY: Social History   Socioeconomic History   Marital status: Single    Spouse name: Not on file   Number of children: 1   Years of education: Not on file   Highest education level: Some college, no degree  Occupational History   Not on file  Tobacco Use   Smoking status: Former    Current packs/day: 0.00    Types: Cigarettes    Start date: 41    Quit date: 2021    Years since quitting: 4.2   Smokeless tobacco: Never  Vaping Use   Vaping status: Never Used  Substance and Sexual Activity   Alcohol use: Not Currently    Comment: quit in age 67's or 33's   Drug use: Yes    Comment: last used in age 33's   Sexual activity: Not Currently  Other Topics Concern   Not on file  Social History Narrative   Moved from [place]; lives with daughter   Social Drivers of Health   Financial Resource Strain: Low Risk  (04/27/2023)   Received from [facility]   Overall Financial Resource Strain (CARDIA)    Difficulty of Paying Living Expenses: Not very hard  Food Insecurity: No Food Insecurity (04/27/2023)   Received from [facility]   Hunger Vital Sign    Worried About Running Out of Food in the Last Year: Never true    Ran Out of Food in the Last Year: Never true  Transportation Needs: No Transportation Needs (04/27/2023)   Received from [facility]    In the past 12 months, has lack of transportation kept you from medical appointments or from getting medications?: No    Lack of Transportation (Non-Medical): No  Physical Activity: Not on file  Stress: Not on file  Social Connections: Moderately Isolated (02/24/2023)   Social Connection and Isolation Panel [NHANES]    Frequency of Communication with Friends and Family: More than three times a week    Frequency of Social Gatherings with Friends and Family: Never    Attends Religious Services: Never    Loss adjuster, chartered or Organizations: Yes    Attends Engineer, structural: More than 4 times per year    Marital Status: Divorced  Intimate Partner Violence: Not At Risk (01/26/2023)   Humiliation, Afraid, Rape, and Kick questionnaire    Fear of Current or Ex-Partner: No    Emotionally Abused: No    Physically Abused: No    Sexually Abused: No    FAMILY HISTORY: Family History  Problem Relation Age of Onset   Alzheimer's disease Mother    Alcohol abuse Father    Schizophrenia Father  Pancreatitis Father    Alcohol abuse Maternal Grandfather    Alzheimer's disease Maternal Grandmother     ALLERGIES:  has no known allergies.  MEDICATIONS:  Current Outpatient Medications  Medication Sig Dispense Refill   albuterol (VENTOLIN HFA) 108 (90 Base) MCG/ACT inhaler Inhale 2 puffs into the lungs every 6 (six) hours as needed for wheezing or shortness of breath. 8 g 2   Aloe-Sodium Chloride (AYR SALINE NASAL GEL NA) Place 1 application. into the nose every other day.     buPROPion (WELLBUTRIN SR) 200 MG 12 hr tablet Take 1 tablet (200 mg total) by mouth 2 (two) times daily. 180 tablet 1   clonazePAM (KLONOPIN) 1 MG tablet Take 1 tablet (1 mg total) by mouth 2 (two) times daily as needed. for anxiety 60 tablet 1   colchicine 0.6 MG tablet Take by mouth.     cyclobenzaprine (FLEXERIL) 10 MG tablet Take 10 mg by mouth 3 (three) times daily as needed.     [START ON 08/31/2023] desvenlafaxine (PRISTIQ) 50 MG 24 hr tablet Take 1 tablet (50 mg total) by mouth daily. 90 tablet 1   diclofenac Sodium (VOLTAREN) 1 % GEL Apply topically.     docusate sodium (COLACE) 100 MG capsule Take 200 mg by mouth daily. 2 tabs in the am     Dulaglutide (TRULICITY) 0.75 MG/0.5ML SOPN Inject 0.75 mg into the skin once a week. 3 mL 2   fenofibrate (TRICOR) 145 MG tablet Take 1 tablet (145 mg total) by mouth daily. 4pm 90 tablet 1   hydroxychloroquine (PLAQUENIL) 200 MG tablet Take 200 mg by mouth 2 (two) times daily.      insulin glargine (LANTUS SOLOSTAR) 100 UNIT/ML Solostar Pen Inject 24 Units into the skin daily.     levothyroxine (SYNTHROID) 175 MCG tablet Take 1 tablet (175 mcg total) by mouth daily before breakfast. 90 tablet 1   lidocaine-prilocaine (EMLA) cream Apply 1 Application topically.     lipase/protease/amylase (CREON) 36000 UNITS CPEP capsule Take 1 capsules with the first bite of each meal and 1 capsule with the first bite of each snack 150 capsule 1   lisinopril (ZESTRIL) 5 MG tablet Take 1 tablet (5 mg total) by mouth daily. 90 tablet 1   Lurasidone HCl 120 MG TABS Take 1 tablet (120 mg total) by mouth daily. 90 tablet 1   meloxicam (MOBIC) 7.5 MG tablet Take 1 tablet (7.5 mg total) by mouth daily. 90 tablet 0   metoprolol succinate (TOPROL-XL) 100 MG 24 hr tablet Take 1 tablet (100 mg total) by mouth daily. Take with or immediately following a meal. 90 tablet 1   Multiple Vitamins-Minerals (HAIR/SKIN/NAILS) TABS Take 1 tablet by mouth daily.     Multiple Vitamins-Minerals (PRESERVISION AREDS 2) CAPS Take 1 capsule by mouth 2 (two) times daily.     omeprazole (PRILOSEC) 40 MG capsule Take 1 capsule (40 mg total) by mouth daily. 90 capsule 1   OVER THE COUNTER MEDICATION Take 1 Scoop by mouth daily in the afternoon. Collagen protein powder     pregabalin (LYRICA) 25 MG capsule Take 25 mg by mouth 2 (two) times daily.     simvastatin (ZOCOR) 20 MG tablet Take 1 tablet (20 mg total) by mouth daily. 90 tablet 1   tiZANidine (ZANAFLEX) 4 MG tablet Take 2-4 mg by mouth every 8 (eight) hours as needed for muscle spasms.     TRELEGY ELLIPTA 100-62.5-25 MCG/ACT AEPB INHALE 1 PUFF ONCE DAILY  trimethoprim (TRIMPEX) 100 MG tablet Take 1 tablet (100 mg total) by mouth daily. 90 tablet 3   umeclidinium-vilanterol (ANORO ELLIPTA) 62.5-25 MCG/ACT AEPB Inhale 2 puffs into the lungs daily as needed (shortness of breath).     Vibegron (GEMTESA) 75 MG TABS Take 1 tablet (75 mg total) by mouth daily. 90  tablet 3   VITAMIN E, TOPICAL, CREA Apply 1 application  topically daily.     zolpidem (AMBIEN) 5 MG tablet Take 1 tablet (5 mg total) by mouth at bedtime as needed for sleep. 30 tablet 1   No current facility-administered medications for this visit.    Review of Systems  Constitutional:  Negative for appetite change, chills, fatigue and fever.  HENT:   Negative for hearing loss and voice change.   Eyes:  Negative for eye problems.  Respiratory:  Negative for chest tightness and cough.   Cardiovascular:  Negative for chest pain.  Gastrointestinal:  Negative for abdominal distention, abdominal pain and blood in stool.  Endocrine: Negative for hot flashes.  Genitourinary:  Negative for difficulty urinating and frequency.   Musculoskeletal:  Positive for arthralgias.  Skin:  Negative for itching and rash.  Neurological:  Negative for extremity weakness.  Hematological:  Negative for adenopathy.  Psychiatric/Behavioral:  Negative for confusion.     PHYSICAL EXAMINATION: Vitals:   08/24/23 1027 08/24/23 1041  BP: (!) 147/90 136/72  Pulse: (!) 58   Resp: 18   Temp: (!) 97.4 F (36.3 C)   SpO2: 100%    Filed Weights   08/24/23 1027  Weight: 201 lb (91.2 kg)    Physical Exam Constitutional:      General: She is not in acute distress. HENT:     Head: Normocephalic and atraumatic.  Eyes:     General: No scleral icterus. Cardiovascular:     Rate and Rhythm: Normal rate and regular rhythm.     Heart sounds: Normal heart sounds.  Pulmonary:     Effort: Pulmonary effort is normal. No respiratory distress.     Breath sounds: No wheezing.  Abdominal:     General: Bowel sounds are normal. There is no distension.     Palpations: Abdomen is soft.  Musculoskeletal:        General: No deformity. Normal range of motion.     Cervical back: Normal range of motion and neck supple.  Skin:    General: Skin is warm and dry.     Findings: No erythema or rash.  Neurological:     Mental  Status: She is alert and oriented to person, place, and time. Mental status is at baseline.     Cranial Nerves: No cranial nerve deficit.     Coordination: Coordination normal.  Psychiatric:        Mood and Affect: Mood normal.      LABORATORY DATA:  I have reviewed the data as listed    Latest Ref Rng & Units 08/22/2023   10:11 AM 01/26/2023   10:14 AM 10/25/2021    2:52 AM  CBC  WBC 4.0 - 10.5 K/uL 7.7  9.3    Hemoglobin 12.0 - 15.0 g/dL 16.1  09.6  04.5   Hematocrit 36.0 - 46.0 % 36.7  38.1  30.3   Platelets 150 - 400 K/uL 262  345        Latest Ref Rng & Units 08/22/2023   10:11 AM 01/26/2023   10:14 AM 10/24/2021    3:14 AM  CMP  Glucose 70 -  99 mg/dL 161  94  096   BUN 8 - 23 mg/dL 21  24  23    Creatinine 0.44 - 1.00 mg/dL 0.45  4.09  8.11   Sodium 135 - 145 mmol/L 139  136  138   Potassium 3.5 - 5.1 mmol/L 4.7  4.1  3.6   Chloride 98 - 111 mmol/L 104  102  107   CO2 22 - 32 mmol/L 25  26  26    Calcium 8.9 - 10.3 mg/dL 9.3  9.8  8.7   Total Protein 6.5 - 8.1 g/dL 6.5  7.8    Total Bilirubin 0.0 - 1.2 mg/dL 0.7  0.6    Alkaline Phos 38 - 126 U/L 20  19    AST 15 - 41 U/L 35  36    ALT 0 - 44 U/L 34  27     Lab Results  Component Value Date   IRON 87 08/22/2023   TIBC 414 08/22/2023   IRONPCTSAT 21 08/22/2023   FERRITIN 572 (H) 08/22/2023     RADIOGRAPHIC STUDIES: I have personally reviewed the radiological images as listed and agreed with the findings in the report. No results found.

## 2023-08-24 NOTE — Assessment & Plan Note (Addendum)
 heterozygous Cys282Tyr Lab Results  Component Value Date   HGB 11.6 (L) 08/22/2023   TIBC 414 08/22/2023   IRONPCTSAT 21 08/22/2023   FERRITIN 572 (H) 08/22/2023   Avoid alcohol consumption, vitamin C supplementation.  Ferritin is slightly above 500, LFT is normal No need for phlebotomy for now.

## 2023-08-24 NOTE — Assessment & Plan Note (Signed)
 Likely due to CKD.  Observation,

## 2023-08-25 ENCOUNTER — Ambulatory Visit (INDEPENDENT_AMBULATORY_CARE_PROVIDER_SITE_OTHER): Payer: Self-pay | Admitting: Licensed Clinical Social Worker

## 2023-08-25 ENCOUNTER — Telehealth: Payer: Self-pay

## 2023-08-25 DIAGNOSIS — F411 Generalized anxiety disorder: Secondary | ICD-10-CM

## 2023-08-25 DIAGNOSIS — F259 Schizoaffective disorder, unspecified: Secondary | ICD-10-CM

## 2023-08-25 DIAGNOSIS — F4310 Post-traumatic stress disorder, unspecified: Secondary | ICD-10-CM | POA: Diagnosis not present

## 2023-08-25 NOTE — Progress Notes (Signed)
 THERAPIST PROGRESS NOTE  Session Time: 9:05am-10:04am  Participation Level: Active  Behavioral Response: CasualAlertDepressed  Type of Therapy: Individual Therapy  Treatment Goals addressed:  Problem: Anger Management        Dates: Start:  03/16/23          Disciplines: Interdisciplinary, PROVIDER                                 Goal: LTG: Ezri will reduce the amount of anger-related incidents/outbursts by 50% as evidenced by self-report                                                           Goal: STG: Katrenia will identify situations, thoughts, and feelings that trigger internal anger, and/or angry/aggressive actions as evidenced by self-report                                                                     Problem: Self Esteem:        Dates: Start:  03/16/23          Description:         Disciplines: Counselor                                    Goal: STG - Other Self-Esteem Goal (Specify)        Dates: Start:  03/16/23    Expected End:  08/12/23          Description: Pt will report improvements in self esteem       Disciplines: Counselor                             ProgressTowards Goals: Progressing  Interventions: Supportive and Anger Management Training  Summary:  Angela Fernandez is a 78 y.o. female who presents with symptoms of anxiety. Patient reports uncontrollable worry, irritability, nightmares, and racing thoughts. Pt was oriented times 5. Pt was cooperative and engaged. Pt denies SI/HI/AVH.   The patient is experiencing overwhelming feelings due to a high volume of doctor appointments. She reflected on improvements in her socialization, noting that she has been reaching out to friends. However, she expressed concern about her brother due to a lack of contact. The clinician assisted her in problem-solving ways to reach out to him via text message. The patient also reflected on her role as the primary maternal disciplinarian during her  upbringing, which has led to difficulties in her relationships with her brothers.  The patient reported that she has forgotten to take her mental health medications for the past two days. The clinician helped her set up a recurring alarm on her phone to remind her to take her meds.  The clinician educated the patient on different forms of anger. She identified a strong sense of anger stemming from feelings of helplessness, particularly regarding her living situation with her granddaughter. The patient  also recognized a history of attachment-related anger due to her mother being a perfectionist, stating, "I was not what she wanted." This relationship has contributed to her history of anorexia.   Additionally, the patient identified feelings of shame and low self-esteem related to her mother's parenting style, leading her to feel humiliated and ridiculed. She expressed that she feels she has lost her identity as a person due to aging. Lastly, she noted feelings of guilt associated with shame and anger, causing her to feel worthless when reflecting on her life and believing she does not do enough to feel meaningful today.   The patient mentioned that she has been utilizing coping skills discussed in previous sessions, such as her container technique and deep breathing, which she finds helpful for regulating unhelpful thoughts.  In future sessions, the patient and clinician will continue to explore the impact of the patient's relationship with her mother on her current mental health.  Suicidal/Homicidal: Nowithout intent/plan  Therapist Response: Clinician utilized active and supportive reflection to create a safe environment for patient to process recent events. Clinician assessed for current stressors, symptoms, safety since last session.  Educated patient on distractive thinking patterns and styles of anger in accordance to anger management expert Ron Potter-Efhron.  Explored how patient's childhood  experiences have contributed to ways in which she manages emotional regulation as well as cope today.  Plan: Return again in 3 weeks.  Diagnosis: Schizoaffective disorder, unspecified type (HCC)  PTSD (post-traumatic stress disorder)  Anxiety state   Collaboration of Care: : AEB psychiatrist can access notes and cln. Will review psychiatrists' notes. Check in with the patient and will see LCSW per availability. Patient agreed with treatment recommendations.   Patient/Guardian was advised Release of Information must be obtained prior to any record release in order to collaborate their care with an outside provider. Patient/Guardian was advised if they have not already done so to contact the registration department to sign all necessary forms in order for us  to release information regarding their care.   Consent: Patient/Guardian gives verbal consent for treatment and assignment of benefits for services provided during this visit. Patient/Guardian expressed understanding and agreed to proceed.   Marvin Slot, LCSW 08/25/2023

## 2023-08-25 NOTE — Telephone Encounter (Signed)
 Patient states she stopped taking the creon because she thought it was making her constipated. She states she has recently found out that is not what was causing her to be constipated it was another medication. She states she is now taking the medication again and wanted to let us  know
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
## 2023-09-12 ENCOUNTER — Ambulatory Visit (INDEPENDENT_AMBULATORY_CARE_PROVIDER_SITE_OTHER): Admitting: Psychiatry

## 2023-09-12 ENCOUNTER — Encounter: Payer: Self-pay | Admitting: Psychiatry

## 2023-09-12 VITALS — BP 124/84 | HR 68 | Temp 98.0°F | Ht 65.0 in | Wt 199.6 lb

## 2023-09-12 DIAGNOSIS — F259 Schizoaffective disorder, unspecified: Secondary | ICD-10-CM

## 2023-09-12 DIAGNOSIS — F4310 Post-traumatic stress disorder, unspecified: Secondary | ICD-10-CM | POA: Diagnosis not present

## 2023-09-12 DIAGNOSIS — G4700 Insomnia, unspecified: Secondary | ICD-10-CM

## 2023-09-12 DIAGNOSIS — F411 Generalized anxiety disorder: Secondary | ICD-10-CM | POA: Diagnosis not present

## 2023-09-12 DIAGNOSIS — Z79899 Other long term (current) drug therapy: Secondary | ICD-10-CM

## 2023-09-12 DIAGNOSIS — F1320 Sedative, hypnotic or anxiolytic dependence, uncomplicated: Secondary | ICD-10-CM

## 2023-09-12 NOTE — Patient Instructions (Signed)
 Continue Pristiq  50 mg every day Continue bupropion  200 mg twice a day Continue Latuda  120 mg at night  Decrease clonazepam  0.5 mg in AM, 1 mg at night    Continue Ambien  5 mg at night as needed for insomnia Obtain urine drug testing at [facility] Next appointment: 6/26 at 10 am

## 2023-09-15 ENCOUNTER — Ambulatory Visit (INDEPENDENT_AMBULATORY_CARE_PROVIDER_SITE_OTHER): Admitting: Licensed Clinical Social Worker

## 2023-09-15 DIAGNOSIS — F259 Schizoaffective disorder, unspecified: Secondary | ICD-10-CM | POA: Diagnosis not present

## 2023-09-15 DIAGNOSIS — F411 Generalized anxiety disorder: Secondary | ICD-10-CM | POA: Diagnosis not present

## 2023-09-15 DIAGNOSIS — F4310 Post-traumatic stress disorder, unspecified: Secondary | ICD-10-CM | POA: Diagnosis not present

## 2023-09-15 NOTE — Progress Notes (Signed)
 THERAPIST PROGRESS NOTE  Session Time: 10-10:45am  Participation Level: Active  Behavioral Response: CasualAlertEuthymic  Type of Therapy: Individual Therapy  Treatment Goals addressed: Treatment Goals addressed:        Problem: Anger Management         Dates: Start:  03/16/23           Disciplines: Interdisciplinary, PROVIDER                                                   Goal: LTG: Maaya will reduce the amount of anger-related incidents/outbursts by 50% as evidenced by self-report                                                                                   Goal: STG: Roselind will identify situations, thoughts, and feelings that trigger internal anger, and/or angry/aggressive actions as evidenced by self-report                                                                                                     Problem: Self Esteem:          Dates: Start:  03/16/23            Description:           Disciplines: Counselor                                                         Goal: STG - Other Self-Esteem Goal (Specify)          Dates: Start:  03/16/23    Expected End:  08/12/23            Description: Pt will report improvements in self esteem         Disciplines: Counselor                           ProgressTowards Goals: Progressing  Interventions: CBT, Solution Focused, Supportive, and Reframing  Summary: Angela Fernandez is a 78 y.o. female who presents with symptoms of anxiety. Patient reports uncontrollable worry, irritability, nightmares, and racing thoughts. Pt was oriented times 5. Pt was cooperative and engaged. Pt denies SI/HI/AVH.    The patient utilized the therapeutic space to process a recent family event and a challenging incident involving her granddaughter. She expressed feelings of rejection from both her granddaughter and her boyfriend, noting that these feelings contribute to stress in  their living situation. However, the  patient identified an improvement in her relationship with her daughter and reflected on a recent conversation about her possibly moving into her own home. She was pleasantly surprised by her daughter's support as they take steps toward finding independent housing.  The clinician provided contact information for a housing support program available through the patient's Medicaid. The patient also mentioned that she is actively working on completing forms for low-income housing and plans to reach out to [facility] to explore case management options. Discussed strategies for maintaining a healthy perspective in case her housing process takes longer than expected and emphasized the importance of coping mechanisms and self-care.  The patient shared that she has found a group of women to socialize with at the [facility] and is interested in attending free lectures at [facility] for senior citizens. She identified researching these lectures as her main priority and agreed to treat this as "homework" before her next session.  Additionally, the patient reflected on the efforts she made in the last session to reach out to her estranged brother and recognized the significance of maintaining relationships with her siblings.     Suicidal/Homicidal: Nowithout intent/plan  Therapist Response: Clinician utilized active and supportive reflection to create a safe environment for patient to process recent events. Clinician assessed for current stressors, symptoms, safety since last session.  Clinician continues to utilize CBT to assist in reframing patient's negative perspectives around uncontrollable situations.  Addressed ways in which patient can increase self-care.  Plan: Return again in 2 weeks.  Diagnosis: Schizoaffective disorder, unspecified type (HCC)  PTSD (post-traumatic stress disorder)  Anxiety state   Collaboration of Care: AEB psychiatrist can access notes and cln. Will review psychiatrists' notes.  Check in with the patient and will see LCSW per availability. Patient agreed with treatment recommendations.   Patient/Guardian was advised Release of Information must be obtained prior to any record release in order to collaborate their care with an outside provider. Patient/Guardian was advised if they have not already done so to contact the registration department to sign all necessary forms in order for us  to release information regarding their care.   Consent: Patient/Guardian gives verbal consent for treatment and assignment of benefits for services provided during this visit. Patient/Guardian expressed understanding and agreed to proceed.   Marvin Slot, LCSW 09/15/2023

## 2023-09-29 ENCOUNTER — Telehealth: Payer: Self-pay | Admitting: *Deleted

## 2023-09-29 NOTE — Telephone Encounter (Signed)
 Next appointment to see Dr. Wilhelmenia Harada is August 18 at 10 AM and then right after labs he will see Dr. Wilhelmenia Harada.  If she needs any assistance she can give us  a call back

## 2023-09-29 NOTE — Telephone Encounter (Signed)
 Patient had called saying that she needs a referral to go over to GI and Dr. Elene Griffes had done it and she now needs to do it again.  Patient did have an appointment for the GYN at [facility] but she was unable to get there.  She does not need a referral she just needs to call over to [facility] the telephone is [phone].  She will need to just call that number to get set up for the GI/then at about the appointment for Dr. Wilhelmenia Harada there is an appointment already set up

## 2023-10-06 ENCOUNTER — Ambulatory Visit (INDEPENDENT_AMBULATORY_CARE_PROVIDER_SITE_OTHER): Admitting: Licensed Clinical Social Worker

## 2023-10-06 DIAGNOSIS — F4310 Post-traumatic stress disorder, unspecified: Secondary | ICD-10-CM

## 2023-10-06 DIAGNOSIS — F411 Generalized anxiety disorder: Secondary | ICD-10-CM | POA: Diagnosis not present

## 2023-10-06 DIAGNOSIS — F259 Schizoaffective disorder, unspecified: Secondary | ICD-10-CM

## 2023-10-06 NOTE — Progress Notes (Signed)
 THERAPIST PROGRESS NOTE  Session Time: 10:10am-10:58am  Participation Level: Active  Behavioral Response: CasualAlertEuthymic  Type of Therapy: Individual Therapy  Treatment Goals addressed:  Problem: Anger Management         Dates: Start:  03/16/23           Disciplines: Interdisciplinary, PROVIDER                                                            Goal: LTG: Aeralyn will reduce the amount of anger-related incidents/outbursts by 50% as evidenced by self-report                                                                                              Goal: STG: Cayli will identify situations, thoughts, and feelings that trigger internal anger, and/or angry/aggressive actions as evidenced by self-report                                                                                                                   Problem: Self Esteem:           Dates: Start:  03/16/23             Description:            Disciplines: Counselor                                                                    Goal: STG - Other Self-Esteem Goal (Specify)           Dates: Start:  03/16/23    Expected End:  08/12/23             Description: Pt will report improvements in self esteem          Disciplines: Counselor                            ProgressTowards Goals: Progressing  Interventions: Solution Focused, Supportive, and Other: Mindfulness  Summary:  Ever Halberg is a 78 y.o. female who presents with symptoms of anxiety. Patient reports uncontrollable worry, irritability, nightmares, and racing thoughts. Pt was oriented times 5. Pt was cooperative and engaged. Pt  denies SI/HI/AVH.   Patient utilized therapeutic space to process improvements in physical health following the back procedure citing significant improvement with back pain.  However despite improvements with physical abilities, patient reports her overall mood and energy levels have been low.   Clinician assessed for medication adherence with patient reporting she has been using her medications as prescribed.  Clinician provided brief psychoeducation on symptomology related to schizoaffective disorder.  Patient expressed anxious symptoms citing uncontrollable worry and an upset stomach.  Clinician utilized session to review coping skills addressed in previous session such as 7-11 breathing, acupressure breathing, eye roll breathing, safe place, her container.  Patient reports successful use with breathing techniques and decreasing her anxious symptoms but cites tension in her shoulders and jaw.  Therefore, clinician educated patient on progressive muscle relaxation.  Patient will continue to utilize his coping skills routinely to manage her symptoms.   Suicidal/Homicidal: Nowithout intent/plan  Therapist Response: Clinician utilized active and supportive reflection to create a safe environment for patient to process recent events. Clinician assessed for current stressors, symptoms, safety since last session.  Reviewed and educated patient on coping skills to manage anxious symptoms.    Plan: Return again in 2 weeks.  Diagnosis: Schizoaffective disorder, unspecified type (HCC)  PTSD (post-traumatic stress disorder)  Anxiety state   Collaboration of Care: AEB psychiatrist can access notes and cln. Will review psychiatrists' notes. Check in with the patient and will see LCSW per availability. Patient agreed with treatment recommendations.   Patient/Guardian was advised Release of Information must be obtained prior to any record release in order to collaborate their care with an outside provider. Patient/Guardian was advised if they have not already done so to contact the registration department to sign all necessary forms in order for us  to release information regarding their care.   Consent: Patient/Guardian gives verbal consent for treatment and assignment of benefits for services  provided during this visit. Patient/Guardian expressed understanding and agreed to proceed.   Marvin Slot, LCSW 10/06/2023

## 2023-10-13 ENCOUNTER — Other Ambulatory Visit: Payer: Self-pay | Admitting: Psychiatry

## 2023-10-14 ENCOUNTER — Other Ambulatory Visit: Payer: Self-pay | Admitting: Psychiatry

## 2023-10-14 MED ORDER — ZOLPIDEM TARTRATE 5 MG PO TABS: 5.0000 mg | ORAL_TABLET | Freq: Every evening | ORAL | 2 refills | Status: DC | PRN

## 2023-10-14 MED ORDER — CLONAZEPAM 0.5 MG PO TABS: ORAL_TABLET | ORAL | 0 refills | Status: DC

## 2023-10-27 ENCOUNTER — Ambulatory Visit (INDEPENDENT_AMBULATORY_CARE_PROVIDER_SITE_OTHER): Admitting: Licensed Clinical Social Worker

## 2023-10-27 ENCOUNTER — Telehealth: Payer: Self-pay | Admitting: Licensed Clinical Social Worker

## 2023-10-27 DIAGNOSIS — F411 Generalized anxiety disorder: Secondary | ICD-10-CM

## 2023-10-27 DIAGNOSIS — F259 Schizoaffective disorder, unspecified: Secondary | ICD-10-CM

## 2023-10-27 DIAGNOSIS — F4310 Post-traumatic stress disorder, unspecified: Secondary | ICD-10-CM

## 2023-10-27 NOTE — Progress Notes (Signed)
 THERAPIST PROGRESS NOTE  Session Time: 9-9:49am  Participation Level: Active  Behavioral Response: CasualAlertEuthymic  Type of Therapy: Individual Therapy  Treatment Goals addressed: Treatment Goals addressed:        Problem: Anger Management         Dates: Start:  03/16/23           Disciplines: Interdisciplinary, PROVIDER                                                            Goal: LTG: Heleena will reduce the amount of anger-related incidents/outbursts by 50% as evidenced by self-report                                                                                              Goal: STG: Towanda will identify situations, thoughts, and feelings that trigger internal anger, and/or angry/aggressive actions as evidenced by self-report                                                                                                                   Problem: Self Esteem:           Dates: Start:  03/16/23             Description:            Disciplines: Counselor                                                                    Goal: STG - Other Self-Esteem Goal (Specify)           Dates: Start:  03/16/23    Expected End:  08/12/23             Description: Pt will report improvements in self esteem          Disciplines: Counselor                            ProgressTowards Goals: Progressing  Interventions: Supportive and Other: Mindfulness  Summary: Sani Loiseau is a 78 y.o. female who presents with symptoms of anxiety. Patient reports uncontrollable worry, irritability, nightmares, and racing thoughts. Pt was oriented times 5.  Pt was cooperative and engaged. Pt denies SI/HI/AVH.  The patient utilized the therapeutic session to discuss improvements in her personal relationships. She reports significant progress in her anxiety symptoms and continues to work toward the goal of reducing her anxiety medications. Additionally, the patient has made  efforts to explore alternative living opportunities with the support of her daughter. She has successfully used certain coping skills to manage her interactions with others, although she finds it challenging to recall breathing techniques. The clinician assisted the patient in problem-solving by creating a physical outline of coping skills that she can practice at home.  The patient experiences occasional auditory and visual hallucinations but is able to reassure herself that they are not real. Specifically, she reports seeing objects in her peripheral vision, such as bugs. With the patient's permission, the clinician will reach out to her psychiatrist regarding these new symptoms, as the patient has expressed difficulties using her MyChart.  The patient has a long history of feeling fear and experiencing out-of-body sensations, stating, I am not myself. She identifies frequent dissociation and forgetfulness as issues. During the session, the clinician helped her identify triggers, such as changes in vision, that signal the onset of dissociative episodes. The patient noted that these episodes are occurring more frequently. She plans to share these symptoms with her psychiatrist during her upcoming session.  Suicidal/Homicidal: Nowithout intent/plan  Therapist Response: Clinician utilized active and supportive reflection to create a safe environment for patient to process recent events. Clinician assessed for current stressors, symptoms, safety since last session.  Reviewed and educated patient on coping skills to manage anxious symptoms.   Plan: Return again in 2 weeks.  Diagnosis:Schizoaffective disorder, unspecified type (HCC)  PTSD (post-traumatic stress disorder)  Anxiety state   Collaboration of Care: AEB psychiatrist can access notes and cln. Will review psychiatrists' notes. Check in with the patient and will see LCSW per availability. Patient agreed with treatment recommendations.    Patient/Guardian was advised Release of Information must be obtained prior to any record release in order to collaborate their care with an outside provider. Patient/Guardian was advised if they have not already done so to contact the registration department to sign all necessary forms in order for us  to release information regarding their care.   Consent: Patient/Guardian gives verbal consent for treatment and assignment of benefits for services provided during this visit. Patient/Guardian expressed understanding and agreed to proceed.   Marvin Slot, LCSW 10/27/2023

## 2023-10-27 NOTE — Telephone Encounter (Signed)
 Thank you so much for the notification. I will address this during her upcoming visit in a few days.
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
## 2023-11-03 ENCOUNTER — Ambulatory Visit (INDEPENDENT_AMBULATORY_CARE_PROVIDER_SITE_OTHER): Admitting: Psychiatry

## 2023-11-03 ENCOUNTER — Other Ambulatory Visit: Payer: Self-pay

## 2023-11-03 ENCOUNTER — Encounter: Payer: Self-pay | Admitting: Psychiatry

## 2023-11-03 VITALS — BP 125/76 | HR 63 | Temp 97.4°F | Ht 65.0 in | Wt 199.8 lb

## 2023-11-03 DIAGNOSIS — F4310 Post-traumatic stress disorder, unspecified: Secondary | ICD-10-CM | POA: Diagnosis not present

## 2023-11-03 DIAGNOSIS — F1320 Sedative, hypnotic or anxiolytic dependence, uncomplicated: Secondary | ICD-10-CM

## 2023-11-03 DIAGNOSIS — F411 Generalized anxiety disorder: Secondary | ICD-10-CM | POA: Diagnosis not present

## 2023-11-03 DIAGNOSIS — F259 Schizoaffective disorder, unspecified: Secondary | ICD-10-CM | POA: Diagnosis not present

## 2023-11-03 MED ORDER — CLONAZEPAM 0.5 MG PO TABS: ORAL_TABLET | ORAL | 1 refills | Status: DC

## 2023-11-03 NOTE — Patient Instructions (Signed)
 Continue Pristiq  50 mg every day Continue bupropion  200 mg twice a day Continue Latuda  120 mg at night  Continue clonazepam  0.5 mg in AM, 1 mg at night   Continue Ambien  5 mg at night as needed for insomnia Obtain urine drug testing at [facility] Next appointment: 8/14 at 10 am

## 2023-11-17 ENCOUNTER — Ambulatory Visit (INDEPENDENT_AMBULATORY_CARE_PROVIDER_SITE_OTHER): Admitting: Licensed Clinical Social Worker

## 2023-11-17 ENCOUNTER — Telehealth: Payer: Self-pay | Admitting: Psychiatry

## 2023-11-17 DIAGNOSIS — F4310 Post-traumatic stress disorder, unspecified: Secondary | ICD-10-CM | POA: Diagnosis not present

## 2023-11-17 DIAGNOSIS — F259 Schizoaffective disorder, unspecified: Secondary | ICD-10-CM

## 2023-11-17 DIAGNOSIS — F411 Generalized anxiety disorder: Secondary | ICD-10-CM

## 2023-11-17 NOTE — Progress Notes (Signed)
 THERAPIST PROGRESS NOTE  Session Time: 10-10:48am  Participation Level: Active  Behavioral Response: CasualAlertAnxious  Type of Therapy: Individual Therapy  Treatment Goals addressed:  Active     Anger Management     LTG: Angela Fernandez will reduce the amount of anger-related incidents/outbursts by 50% as evidenced by self-report (Progressing)     Start:  03/16/23    Expected End:  08/12/23       Goal Note     07/18/2023: I've gotten more passive aggressive. Will continue to process recent experiences and challenge behaviors.          STG: Angela Fernandez will identify situations, thoughts, and feelings that trigger internal anger, and/or angry/aggressive actions as evidenced by self-report (Progressing)     Start:  03/16/23    Expected End:  08/12/23       Goal Note     07/18/2023: 50% progress I'm controlling it [her situation] a lot more. Identifies she has not had an outburst since beginning therapy.          Educate Angela Fernandez on anger management skills and the rationale for learning these skills     Start:  03/16/23         Educate Angela Fernandez on internal and external reinforcers of anger response and the rationale for identifying reinforcers of angry responses     Start:  03/16/23         Educate Angela Fernandez on relaxation techniques and the rationale for learning these techniques     Start:  03/16/23         Encourage Angela Fernandez to practice relaxation skills at home for 5 minutes, 1 times per day, for 10 days     Start:  03/16/23         Educate Angela Fernandez on the 6 categories of cognitive distortions that promote anger (Blaming, Catastrophizing, Global Labeling, Misattributions, Overgeneralization, Demanding)      Start:  03/16/23         Educate Angela Fernandez on coping skills to cope with anger arousal      Start:  03/16/23           OP Depression     LTG: Reduce frequency, intensity, and duration of depression symptoms so that daily functioning is improved (Progressing)      Start:  03/16/23    Expected End:  08/12/23       Goal Note     07/18/23: 40% progress I'm going through something that is bothering me because I cannot remember things. Pt identified a desire to work on problem solving barriers to patient engaging in self care such as painting and moving.         STG: Angela Fernandez will identify cognitive patterns and beliefs that support depression (Progressing)     Start:  03/16/23    Expected End:  08/12/23       Goal Note     07/18/23: 40% progress I've been working on that a lot.          STG: Angela Fernandez will practice behavioral activation skills 2 times per week for the next 8 weeks (Progressing)     Start:  03/16/23    Expected End:  08/12/23       Goal Note     07/18/23: Patient reports she is participating in swim classes 2x/week but reports she is struggling to keep up with painting due to limited supplies.          STG - Misc 1 (Not Applicable)  Start:  03/16/23    Expected End:  08/12/23    Resolved:  07/18/23   Pt reports she would like to expand her support system. Pt will accomplish this by calling friends starting out once a month form reports 0 times a month.     Goal Note     Blank         Work with Angela Fernandez to identify the major components of a recent episode of depression: physical symptoms, major thoughts and images, and major behaviors they experienced     Start:  03/16/23         Therapist will educate patient on cognitive distortions and the rationale for treatment of depression     Start:  03/16/23         Angela Fernandez will identify 3 trauma related cognitive distortions     Start:  03/16/23         Perform motivational interviewing regarding physical activity     Start:  03/16/23           Self Esteem:         STG - Other Self-Esteem Goal (Specify) (Progressing)     Start:  03/16/23    Expected End:  08/12/23      Pt will report improvements in self esteem    Goal Note     07/18/23: 25% progress  I've been working hard on my self esteem.I tell myself I have a right to do what I need.         Identify opportunities to increase self-esteem     Start:  03/16/23            Coping Skills      Start:  03/16/23      Will Work with patient to decrease the frequency of negative self-descriptive statements and increase the frequency of positive self- descriptive statements using CBT/DBT/REBT techniques per patient self report 3 out of 5 documented sessions. Some of the techniques that will be used will be CBT, positive affirmations, role playing, modeling, homework and journaling.           ProgressTowards Goals: Progressing  Interventions: Solution Focused and Supportive  Summary: Angela Fernandez is a 78 y.o. female who presents with symptoms of anxiety. Patient reports uncontrollable worry, irritability, nightmares, and racing thoughts. Pt was oriented times 5. Pt was cooperative and engaged. Pt denies SI/HI/AVH.   Patient reports to session stating her moods have been not so good. The patient reports struggling to engage in social outings she is interested in, leading to continued isolation. She attributes some changes in her routine to a recent procedure, but she still expresses a desire to return to hobbies she enjoys. Additionally, she reports ongoing discord at home, which she finds challenging to process mentally. In an effort to relieve her anxiety, she has been using a container for this situation as a coping mechanism.   The patient became tearful while reflecting on her desire to move back to [place] . The clinician worked with her to explore what steps she could take to assess whether that move is a feasible next step. Although she identified several barriers to relocating, she expressed a desire to visit her brother in [place]  instead. The clinician assisted her in exploring the necessary steps to make that trip happen.   The patient also expressed a desire to move into an  apartment in the local area to gain more independence, believing this will positively impact her mental health.  The clinician assessed her progress on homework assigned in the previous session, where the patient had denied having completed it. They identified barriers to her follow-through and explored the patient's readiness to discuss finding housing in [place] with her daughter. The patient agreed to this conversation.  The patient continues to struggle with depression and anxiety, making it difficult for her to engage in activities and life changes that she desires. While she establishes steps during sessions, she demonstrates poor follow-through outside of the sessions, despite the clinician's efforts to explore barriers and potential solutions. Nevertheless, the patient remains committed to working toward changing her situation and regulating her mood.  Suicidal/Homicidal: Nowithout intent/plan  Therapist Response: Clinician utilized active and supportive reflection to create a safe environment for patient to process recent events. Clinician assessed for current stressors, symptoms, safety since last session. The clinician helped the patient process her feelings and employed solution-focused interventions to outline steps for improving her current situation and moving toward her goal of living independently. They identified barriers to the patient's follow-through on the steps discussed in the session and assessed her readiness for change.  Plan: Return again in 2 weeks.  Diagnosis:Schizoaffective disorder, unspecified type (HCC)  PTSD (post-traumatic stress disorder)  Anxiety state   Collaboration of Care: AEB psychiatrist can access notes and cln. Will review psychiatrists' notes. Check in with the patient and will see LCSW per availability. Patient agreed with treatment recommendations.   Patient/Guardian was advised Release of Information must be obtained prior to any record release  in order to collaborate their care with an outside provider. Patient/Guardian was advised if they have not already done so to contact the registration department to sign all necessary forms in order for us  to release information regarding their care.   Consent: Patient/Guardian gives verbal consent for treatment and assignment of benefits for services provided during this visit. Patient/Guardian expressed understanding and agreed to proceed.   Evalene KATHEE Husband, LCSW 11/17/2023

## 2023-11-21 ENCOUNTER — Ambulatory Visit (INDEPENDENT_AMBULATORY_CARE_PROVIDER_SITE_OTHER): Payer: Self-pay | Admitting: Urology

## 2023-11-21 VITALS — BP 127/72 | HR 64 | Ht 64.0 in | Wt 196.0 lb

## 2023-11-21 DIAGNOSIS — N3941 Urge incontinence: Secondary | ICD-10-CM

## 2023-11-21 DIAGNOSIS — N390 Urinary tract infection, site not specified: Secondary | ICD-10-CM

## 2023-11-21 DIAGNOSIS — R3915 Urgency of urination: Secondary | ICD-10-CM

## 2023-11-21 DIAGNOSIS — Z87440 Personal history of urinary (tract) infections: Secondary | ICD-10-CM

## 2023-11-21 LAB — URINALYSIS, COMPLETE
Bilirubin, UA: NEGATIVE
Glucose, UA: NEGATIVE
Ketones, UA: NEGATIVE
Nitrite, UA: NEGATIVE
Protein,UA: NEGATIVE
RBC, UA: NEGATIVE
Specific Gravity, UA: 1.015 (ref 1.005–1.030)
Urobilinogen, Ur: 0.2 mg/dL (ref 0.2–1.0)
pH, UA: 7 (ref 5.0–7.5)

## 2023-11-21 LAB — MICROSCOPIC EXAMINATION

## 2023-11-21 MED ORDER — TRIMETHOPRIM 100 MG PO TABS: 100.0000 mg | ORAL_TABLET | Freq: Every day | ORAL | 3 refills | Status: AC

## 2023-11-21 MED ORDER — GEMTESA 75 MG PO TABS: 75.0000 mg | ORAL_TABLET | Freq: Every day | ORAL | 3 refills | Status: AC

## 2023-11-21 NOTE — Progress Notes (Signed)
 11/21/2023 10:31 AM   Angela Fernandez 1945-07-11 [id number]  Referring provider: Epifanio Alm SQUIBB, MD [address] [place],  [place] [address]  Chief Complaint  Patient presents with   Follow-up    HPI: I was consulted to assess the patient's recurrent bladder infections. She has had about 8 infections each year with small-volume frequency urgency pressure and dysuria that respond favorably antibiotics.  At baseline she is continent. She voids every 2-3 hours gets up once at night  She has no hysterectomy. She has had a TIA. She is on insulin  for diabetes      Well supported bladder neck and a small asymptomatic grade 1 cystocele    Patient has recurrent bladder infections.  Reassess in 8 weeks Trimethoprim  prophylaxis.  30x11 sent to the pharmacy.  Call if culture positive.  Call if renal ultrasound abnormal    Ultrasound normal.  Saw the nurse practitioner on Gemtesa  in September 2023.  Was doing very well on the Gemtesa .  infection free on trimethoprim  infection free on trimethoprim .  Very good control on Gemtesa  both medications renewed and see in 1 year  Today Infection free on trimethoprim .  Urge incontinence much better on Gemtesa .  Frequency stable.  Very    PMH: Past Medical History:  Diagnosis Date   Anemia    Arthritis    Bipolar affect, depressed (HCC)    COPD (chronic obstructive pulmonary disease) (HCC)    Diabetes mellitus without complication (HCC)    type II   GERD (gastroesophageal reflux disease)    Hyperlipidemia    Hypertension    Hypothyroidism    PTSD (post-traumatic stress disorder)    Schizoaffective disorder (HCC)    Sleep apnea    cpap   Stroke (HCC)    hx of  mini stroke - 2001   TIA (transient ischemic attack) 2010   UTI (urinary tract infection)     Surgical History: Past Surgical History:  Procedure Laterality Date   BILATERAL CARPAL TUNNEL RELEASE Bilateral    BREAST BIOPSY     BREAST SURGERY Right    lumpectomy    CATARACT EXTRACTION W/ INTRAOCULAR LENS  IMPLANT, BILATERAL Bilateral    CHOLECYSTECTOMY     COLONOSCOPY     ESOPHAGOGASTRODUODENOSCOPY     EYE SURGERY     JOINT REPLACEMENT     NASAL SINUS SURGERY     REPLACEMENT TOTAL KNEE BILATERAL Bilateral    REVERSE SHOULDER ARTHROPLASTY Right 09/02/2020   Procedure: REVERSE SHOULDER ARTHROPLASTY;  Surgeon: Edie Norleen PARAS, MD;  Location: [facility] ORS;  Service: Orthopedics;  Laterality: Right;   SHOULDER SURGERY Right 2020   rotator cuff repair   TOTAL SHOULDER REVISION Right 10/23/2021   Procedure: REVERSE SHOULDER REVISION;  Surgeon: Sharl Selinda Dover, MD;  Location: [facility];  Service: Orthopedics;  Laterality: Right;  180    Home Medications:  Allergies as of 11/21/2023   No Known Allergies      Medication List        Accurate as of November 21, 2023 10:31 AM. If you have any questions, ask your nurse or doctor.          STOP taking these medications    Hair/Skin/Nails Tabs   PreserVision AREDS 2 Caps       TAKE these medications    albuterol  108 (90 Base) MCG/ACT inhaler Commonly known as: VENTOLIN  HFA Inhale 2 puffs into the lungs every 6 (six) hours as needed for wheezing or shortness of breath.  Anoro Ellipta  62.5-25 MCG/ACT Aepb Generic drug: umeclidinium-vilanterol Inhale 2 puffs into the lungs daily as needed (shortness of breath).   AYR SALINE NASAL GEL NA Place 1 application. into the nose every other day.   buPROPion  200 MG 12 hr tablet Commonly known as: WELLBUTRIN  SR Take 1 tablet (200 mg total) by mouth 2 (two) times daily.   clonazePAM  0.5 MG tablet Commonly known as: KLONOPIN  Take 1 tablet (0.5 mg total) by mouth daily AND 2 tablets (1 mg total) every evening. for anxiety.   colchicine 0.6 MG tablet Take by mouth.   cyclobenzaprine  10 MG tablet Commonly known as: FLEXERIL  Take 10 mg by mouth 3 (three) times daily as needed.   desvenlafaxine  50 MG 24 hr tablet Commonly known as: PRISTIQ  Take 1  tablet (50 mg total) by mouth daily.   diclofenac Sodium 1 % Gel Commonly known as: VOLTAREN Apply topically.   docusate sodium  100 MG capsule Commonly known as: COLACE Take 200 mg by mouth daily. 2 tabs in the am   fenofibrate  145 MG tablet Commonly known as: TRICOR  Take 1 tablet (145 mg total) by mouth daily. 4pm   Gemtesa  75 MG Tabs Generic drug: Vibegron  Take 1 tablet (75 mg total) by mouth daily.   hydroxychloroquine 200 MG tablet Commonly known as: PLAQUENIL Take 200 mg by mouth 2 (two) times daily.   Lantus  SoloStar 100 UNIT/ML Solostar Pen Generic drug: insulin  glargine Inject 24 Units into the skin daily.   levothyroxine  175 MCG tablet Commonly known as: SYNTHROID  Take 1 tablet (175 mcg total) by mouth daily before breakfast.   lidocaine -prilocaine cream Commonly known as: EMLA Apply 1 Application topically.   lipase/protease/amylase 63999 UNITS Cpep capsule Commonly known as: Creon  Take 1 capsules with the first bite of each meal and 1 capsule with the first bite of each snack   lisinopril  5 MG tablet Commonly known as: ZESTRIL  Take 1 tablet (5 mg total) by mouth daily.   Lurasidone  HCl 120 MG Tabs Take 1 tablet (120 mg total) by mouth daily.   meloxicam  7.5 MG tablet Commonly known as: MOBIC  Take 1 tablet (7.5 mg total) by mouth daily.   metoprolol  succinate 100 MG 24 hr tablet Commonly known as: TOPROL -XL Take 1 tablet (100 mg total) by mouth daily. Take with or immediately following a meal.   omeprazole  40 MG capsule Commonly known as: PRILOSEC Take 1 capsule (40 mg total) by mouth daily.   OVER THE COUNTER MEDICATION Take 1 Scoop by mouth daily in the afternoon. Collagen protein powder   pregabalin 25 MG capsule Commonly known as: LYRICA Take 25 mg by mouth 2 (two) times daily.   simvastatin  20 MG tablet Commonly known as: ZOCOR  Take 1 tablet (20 mg total) by mouth daily.   tiZANidine  4 MG tablet Commonly known as: ZANAFLEX  Take 2-4  mg by mouth every 8 (eight) hours as needed for muscle spasms.   Trelegy Ellipta  100-62.5-25 MCG/ACT Aepb Generic drug: Fluticasone-Umeclidin-Vilant INHALE 1 PUFF ONCE DAILY   triamcinolone  ointment 0.1 % Commonly known as: KENALOG  Apply topically 2 (two) times daily.   trimethoprim  100 MG tablet Commonly known as: TRIMPEX  Take 1 tablet (100 mg total) by mouth daily.   Trulicity  0.75 MG/0.5ML Soaj Generic drug: Dulaglutide  Inject 0.75 mg into the skin once a week.   VITAMIN E (TOPICAL) Crea Apply 1 application  topically daily.   zolpidem  5 MG tablet Commonly known as: AMBIEN  Take 1 tablet (5 mg total) by mouth at bedtime  as needed for sleep.        Allergies: No Known Allergies  Family History: Family History  Problem Relation Age of Onset   Alzheimer's disease Mother    Alcohol  abuse Father    Schizophrenia Father    Pancreatitis Father    Alcohol  abuse Maternal Grandfather    Alzheimer's disease Maternal Grandmother     Social History:  reports that she quit smoking about 4 years ago. Her smoking use included cigarettes. She started smoking about 58 years ago. She has never used smokeless tobacco. She reports that she does not currently use alcohol . She reports current drug use.  ROS:                                        Physical Exam: There were no vitals taken for this visit.  Constitutional:  Alert and oriented, No acute distress. HEENT: [place] AT, moist mucus membranes.  Trachea midline, no masses. Cardiovascular: No clubbing, cyanosis, or edema.   Laboratory Data: Lab Results  Component Value Date   WBC 7.7 08/22/2023   HGB 11.6 (L) 08/22/2023   HCT 36.7 08/22/2023   MCV 98.9 08/22/2023   PLT 262 08/22/2023    Lab Results  Component Value Date   CREATININE 1.06 (H) 08/22/2023    No results found for: PSA  No results found for: TESTOSTERONE  Lab Results  Component Value Date   HGBA1C 5.2 10/12/2021     Urinalysis    Component Value Date/Time   COLORURINE STRAW (A) 02/06/2021 1630   APPEARANCEUR Clear 11/29/2022 1042   LABSPEC 1.003 (L) 02/06/2021 1630   PHURINE 6.0 02/06/2021 1630   GLUCOSEU Negative 11/29/2022 1042   HGBUR NEGATIVE 02/06/2021 1630   BILIRUBINUR Negative 11/29/2022 1042   KETONESUR negative 08/06/2021 1930   KETONESUR NEGATIVE 02/06/2021 1630   PROTEINUR Negative 11/29/2022 1042   PROTEINUR NEGATIVE 02/06/2021 1630   UROBILINOGEN 1.0 09/01/2022 1100   NITRITE Negative 11/29/2022 1042   NITRITE NEGATIVE 02/06/2021 1630   LEUKOCYTESUR Trace (A) 11/29/2022 1042   LEUKOCYTESUR TRACE (A) 02/06/2021 1630    Pertinent Imaging:   Assessment & Plan: Both prescriptions renewed 90 x 3 and see in 1 year  1. Recurrent UTI (Primary)  - Urinalysis, Complete   No follow-ups on file.  Glendia DELENA Elizabeth, MD  [facility] [address] [place], [place] [address] [phone]

## 2023-12-08 ENCOUNTER — Telehealth: Payer: Self-pay | Admitting: Psychiatry

## 2023-12-08 ENCOUNTER — Ambulatory Visit (INDEPENDENT_AMBULATORY_CARE_PROVIDER_SITE_OTHER): Admitting: Licensed Clinical Social Worker

## 2023-12-08 DIAGNOSIS — F4310 Post-traumatic stress disorder, unspecified: Secondary | ICD-10-CM | POA: Diagnosis not present

## 2023-12-08 DIAGNOSIS — F411 Generalized anxiety disorder: Secondary | ICD-10-CM | POA: Diagnosis not present

## 2023-12-08 DIAGNOSIS — F259 Schizoaffective disorder, unspecified: Secondary | ICD-10-CM | POA: Diagnosis not present

## 2023-12-08 NOTE — Progress Notes (Signed)
 THERAPIST PROGRESS NOTE  Session Time: 9:40am-10:34am  Participation Level: Active  Behavioral Response: CasualAlertEuthymic  Type of Therapy: Individual Therapy  Treatment Goals addressed:  Active     Anger Management     LTG: Angela Fernandez will reduce the amount of anger-related incidents/outbursts by 50% as evidenced by self-report (Progressing)     Start:  03/16/23    Expected End:  08/12/23       Goal Note     07/18/2023: I've gotten more passive aggressive. Will continue to process recent experiences and challenge behaviors.          STG: Angela Fernandez will identify situations, thoughts, and feelings that trigger internal anger, and/or angry/aggressive actions as evidenced by self-report (Progressing)     Start:  03/16/23    Expected End:  08/12/23       Goal Note     07/18/2023: 50% progress I'm controlling it [her situation] a lot more. Identifies she has not had an outburst since beginning therapy.          Educate Angela Fernandez on anger management skills and the rationale for learning these skills     Start:  03/16/23         Educate Angela Fernandez on internal and external reinforcers of anger response and the rationale for identifying reinforcers of angry responses     Start:  03/16/23         Educate Angela Fernandez on relaxation techniques and the rationale for learning these techniques     Start:  03/16/23         Encourage Angela Fernandez to practice relaxation skills at home for 5 minutes, 1 times per day, for 10 days     Start:  03/16/23         Educate Angela Fernandez on the 6 categories of cognitive distortions that promote anger (Blaming, Catastrophizing, Global Labeling, Misattributions, Overgeneralization, Demanding)      Start:  03/16/23         Educate Angela Fernandez on coping skills to cope with anger arousal      Start:  03/16/23           OP Depression     LTG: Reduce frequency, intensity, and duration of depression symptoms so that daily functioning is improved (Progressing)      Start:  03/16/23    Expected End:  08/12/23       Goal Note     07/18/23: 40% progress I'm going through something that is bothering me because I cannot remember things. Pt identified a desire to work on problem solving barriers to patient engaging in self care such as painting and moving.         STG: Angela Fernandez will identify cognitive patterns and beliefs that support depression (Progressing)     Start:  03/16/23    Expected End:  08/12/23       Goal Note     07/18/23: 40% progress I've been working on that a lot.          STG: Angela Fernandez will practice behavioral activation skills 2 times per week for the next 8 weeks (Progressing)     Start:  03/16/23    Expected End:  08/12/23       Goal Note     07/18/23: Patient reports she is participating in swim classes 2x/week but reports she is struggling to keep up with painting due to limited supplies.          STG - Misc 1 (Not Applicable)  Start:  03/16/23    Expected End:  08/12/23    Resolved:  07/18/23   Pt reports she would like to expand her support system. Pt will accomplish this by calling friends starting out once a month form reports 0 times a month.     Goal Note     Blank         Work with Angela Fernandez to identify the major components of a recent episode of depression: physical symptoms, major thoughts and images, and major behaviors they experienced     Start:  03/16/23         Therapist will educate patient on cognitive distortions and the rationale for treatment of depression     Start:  03/16/23         Angela Fernandez will identify 3 trauma related cognitive distortions     Start:  03/16/23         Perform motivational interviewing regarding physical activity     Start:  03/16/23           Self Esteem:         STG - Other Self-Esteem Goal (Specify) (Progressing)     Start:  03/16/23    Expected End:  08/12/23      Pt will report improvements in self esteem    Goal Note     07/18/23: 25%  progress I've been working hard on my self esteem.I tell myself I have a right to do what I need.         Identify opportunities to increase self-esteem     Start:  03/16/23            Coping Skills      Start:  03/16/23      Will Work with patient to decrease the frequency of negative self-descriptive statements and increase the frequency of positive self- descriptive statements using CBT/DBT/REBT techniques per patient self report 3 out of 5 documented sessions. Some of the techniques that will be used will be CBT, positive affirmations, role playing, modeling, homework and journaling.           ProgressTowards Goals: Progressing  Interventions: CBT, Supportive, Reframing, and Other: Psychoeducation  Summary: Angela Fernandez is a 78 y.o. female who presents with symptoms of anxiety. Patient reports uncontrollable worry, irritability, nightmares, and racing thoughts. Pt was oriented times 5. Pt was cooperative and engaged. Pt denies SI/HI/AVH.   Patient utilized space to continue to process difficulty communicating with her granddaughter and providing housing update.  Patient reports successful use of her container and helping improve her anxiety as she is letting go of others' behaviors impacting her mental health.  The clinician educated the patient on common cognitive distortions and reflected on her experiences with all-or-nothing thinking, labeling (e.g., I'm useless), ignoring the positive, and personalization. The patient worked with the clinician through [employer] [facility] (CBT) to challenge these thinking traps by reflecting on the evidence for and against these thoughts. The patient then reframed her negative cognitions by acknowledging what she can still do and moving towards acceptance. Lastly, the patient created a visual representation of the bully in her brain.  Suicidal/Homicidal: Nowithout intent/plan  Therapist Response: Clinician utilized active and  supportive reflection to create a safe environment for patient to process recent events. Clinician assessed for current stressors, symptoms, safety since last session. Cln eductaed the patient on common cognitive problems and educated on ways she can challenge these thoughts.   Plan: Return again in 4 weeks.  Diagnosis: Schizoaffective disorder, unspecified type (HCC)  PTSD (post-traumatic stress disorder)  Anxiety state   Collaboration of Care: AEB psychiatrist can access notes and cln. Will review psychiatrists' notes. Check in with the patient and will see LCSW per availability. Patient agreed with treatment recommendations.   Patient/Guardian was advised Release of Information must be obtained prior to any record release in order to collaborate their care with an outside provider. Patient/Guardian was advised if they have not already done so to contact the registration department to sign all necessary forms in order for us  to release information regarding their care.   Consent: Patient/Guardian gives verbal consent for treatment and assignment of benefits for services provided during this visit. Patient/Guardian expressed understanding and agreed to proceed.   Evalene KATHEE Husband, LCSW 12/08/2023

## 2023-12-08 NOTE — Telephone Encounter (Signed)
 I'm unable to order the test to be done at the [facility], as the provider there must place the order. However, I can order the test at [facility]. Please let me know if that works for her. If not, she may need to contact her primary care provider to have it arranged.

## 2023-12-12 NOTE — Telephone Encounter (Signed)
Called patient no answer left voicemail for patient to return call to office 

## 2023-12-13 ENCOUNTER — Other Ambulatory Visit: Payer: Self-pay | Admitting: Emergency Medicine

## 2023-12-13 DIAGNOSIS — J439 Emphysema, unspecified: Secondary | ICD-10-CM

## 2023-12-13 DIAGNOSIS — Z87891 Personal history of nicotine dependence: Secondary | ICD-10-CM

## 2023-12-13 DIAGNOSIS — R0602 Shortness of breath: Secondary | ICD-10-CM

## 2023-12-13 DIAGNOSIS — Z122 Encounter for screening for malignant neoplasm of respiratory organs: Secondary | ICD-10-CM

## 2023-12-13 NOTE — Telephone Encounter (Signed)
See video visit
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
## 2023-12-21 ENCOUNTER — Other Ambulatory Visit: Payer: Self-pay | Admitting: Physical Medicine & Rehabilitation

## 2023-12-21 ENCOUNTER — Telehealth: Payer: Self-pay | Admitting: Oncology

## 2023-12-21 DIAGNOSIS — M5412 Radiculopathy, cervical region: Secondary | ICD-10-CM

## 2023-12-21 NOTE — Telephone Encounter (Signed)
 Pt called and asked to have appt r/s - LH

## 2023-12-22 ENCOUNTER — Encounter: Payer: Self-pay | Admitting: Psychiatry

## 2023-12-22 ENCOUNTER — Telehealth: Payer: Self-pay

## 2023-12-22 ENCOUNTER — Other Ambulatory Visit: Payer: Self-pay

## 2023-12-22 ENCOUNTER — Ambulatory Visit (INDEPENDENT_AMBULATORY_CARE_PROVIDER_SITE_OTHER): Admitting: Psychiatry

## 2023-12-22 VITALS — BP 113/73 | HR 66 | Temp 96.3°F | Ht 64.0 in | Wt 194.8 lb

## 2023-12-22 DIAGNOSIS — G4700 Insomnia, unspecified: Secondary | ICD-10-CM

## 2023-12-22 DIAGNOSIS — F411 Generalized anxiety disorder: Secondary | ICD-10-CM

## 2023-12-22 DIAGNOSIS — F4310 Post-traumatic stress disorder, unspecified: Secondary | ICD-10-CM

## 2023-12-22 DIAGNOSIS — Z79899 Other long term (current) drug therapy: Secondary | ICD-10-CM

## 2023-12-22 DIAGNOSIS — F1320 Sedative, hypnotic or anxiolytic dependence, uncomplicated: Secondary | ICD-10-CM

## 2023-12-22 DIAGNOSIS — F259 Schizoaffective disorder, unspecified: Secondary | ICD-10-CM

## 2023-12-22 MED ORDER — CLONAZEPAM 0.5 MG PO TABS: ORAL_TABLET | ORAL | 1 refills | Status: DC

## 2023-12-22 MED ORDER — DOXEPIN HCL 6 MG PO TABS: 6.0000 mg | ORAL_TABLET | Freq: Every evening | ORAL | 1 refills | Status: DC | PRN

## 2023-12-22 MED ORDER — BUPROPION HCL ER (SR) 200 MG PO TB12: 200.0000 mg | ORAL_TABLET | Freq: Two times a day (BID) | ORAL | 1 refills | Status: AC

## 2023-12-22 NOTE — Telephone Encounter (Signed)
 Received fax requesting a prior authorization for the patients Doxepin  HCI 6 mg tablets initiated via CoverMyMeds pending determination

## 2023-12-22 NOTE — Telephone Encounter (Signed)
 Patients Doxepin  6 mg was denied called patient to inform and of the denial no answer left voicemail for patient to return call to office  Message from CoverMyMeds  Doxepin  tablet is denied because it is not on your plan's Drug List (formulary). Medication authorization requires the following: (1) You need to try three (3) of these covered drugs: (a) Belsomra. (b) Eszopiclone. (c) Quviviq. (d) Ramelteon . (e) Zaleplon

## 2023-12-22 NOTE — Patient Instructions (Signed)
 Continue Pristiq  50 mg every day Continue bupropion  200 mg twice a day Continue Latuda  120 mg at night  Continue clonazepam  0.5 mg in AM, 1 mg at night   Start doxepin  6 mg at night as needed for insomnia Hold Ambien   Obtain urine drug testing at [facility] Next appointment: 10/13 at 8:30

## 2023-12-26 ENCOUNTER — Inpatient Hospital Stay: Attending: Oncology

## 2023-12-26 DIAGNOSIS — E1122 Type 2 diabetes mellitus with diabetic chronic kidney disease: Secondary | ICD-10-CM | POA: Insufficient documentation

## 2023-12-26 DIAGNOSIS — I129 Hypertensive chronic kidney disease with stage 1 through stage 4 chronic kidney disease, or unspecified chronic kidney disease: Secondary | ICD-10-CM | POA: Diagnosis not present

## 2023-12-26 DIAGNOSIS — Z791 Long term (current) use of non-steroidal anti-inflammatories (NSAID): Secondary | ICD-10-CM | POA: Insufficient documentation

## 2023-12-26 DIAGNOSIS — N183 Chronic kidney disease, stage 3 unspecified: Secondary | ICD-10-CM | POA: Diagnosis not present

## 2023-12-26 DIAGNOSIS — Z79890 Hormone replacement therapy (postmenopausal): Secondary | ICD-10-CM | POA: Diagnosis not present

## 2023-12-26 DIAGNOSIS — E039 Hypothyroidism, unspecified: Secondary | ICD-10-CM | POA: Diagnosis not present

## 2023-12-26 DIAGNOSIS — Z96653 Presence of artificial knee joint, bilateral: Secondary | ICD-10-CM | POA: Insufficient documentation

## 2023-12-26 DIAGNOSIS — Z87891 Personal history of nicotine dependence: Secondary | ICD-10-CM | POA: Diagnosis not present

## 2023-12-26 DIAGNOSIS — E785 Hyperlipidemia, unspecified: Secondary | ICD-10-CM | POA: Insufficient documentation

## 2023-12-26 DIAGNOSIS — Z148 Genetic carrier of other disease: Secondary | ICD-10-CM

## 2023-12-26 DIAGNOSIS — Z794 Long term (current) use of insulin: Secondary | ICD-10-CM | POA: Insufficient documentation

## 2023-12-26 DIAGNOSIS — Z7985 Long-term (current) use of injectable non-insulin antidiabetic drugs: Secondary | ICD-10-CM | POA: Diagnosis not present

## 2023-12-26 DIAGNOSIS — Z79899 Other long term (current) drug therapy: Secondary | ICD-10-CM | POA: Insufficient documentation

## 2023-12-26 DIAGNOSIS — D649 Anemia, unspecified: Secondary | ICD-10-CM | POA: Diagnosis not present

## 2023-12-26 LAB — CBC WITH DIFFERENTIAL (CANCER CENTER ONLY)
Abs Immature Granulocytes: 0.04 K/uL (ref 0.00–0.07)
Basophils Absolute: 0.1 K/uL (ref 0.0–0.1)
Basophils Relative: 1 %
Eosinophils Absolute: 0.2 K/uL (ref 0.0–0.5)
Eosinophils Relative: 2 %
HCT: 34.1 % — ABNORMAL LOW (ref 36.0–46.0)
Hemoglobin: 11.1 g/dL — ABNORMAL LOW (ref 12.0–15.0)
Immature Granulocytes: 0 %
Lymphocytes Relative: 41 %
Lymphs Abs: 4.1 K/uL — ABNORMAL HIGH (ref 0.7–4.0)
MCH: 32.2 pg (ref 26.0–34.0)
MCHC: 32.6 g/dL (ref 30.0–36.0)
MCV: 98.8 fL (ref 80.0–100.0)
Monocytes Absolute: 0.5 K/uL (ref 0.1–1.0)
Monocytes Relative: 5 %
Neutro Abs: 5.2 K/uL (ref 1.7–7.7)
Neutrophils Relative %: 51 %
Platelet Count: 296 K/uL (ref 150–400)
RBC: 3.45 MIL/uL — ABNORMAL LOW (ref 3.87–5.11)
RDW: 13.2 % (ref 11.5–15.5)
WBC Count: 10.1 K/uL (ref 4.0–10.5)
nRBC: 0 % (ref 0.0–0.2)

## 2023-12-26 LAB — FERRITIN: Ferritin: 501 ng/mL — ABNORMAL HIGH (ref 11–307)

## 2023-12-26 LAB — CMP (CANCER CENTER ONLY)
ALT: 23 U/L (ref 0–44)
AST: 29 U/L (ref 15–41)
Albumin: 4.1 g/dL (ref 3.5–5.0)
Alkaline Phosphatase: 19 U/L — ABNORMAL LOW (ref 38–126)
Anion gap: 9 (ref 5–15)
BUN: 27 mg/dL — ABNORMAL HIGH (ref 8–23)
CO2: 24 mmol/L (ref 22–32)
Calcium: 9.4 mg/dL (ref 8.9–10.3)
Chloride: 102 mmol/L (ref 98–111)
Creatinine: 0.96 mg/dL (ref 0.44–1.00)
GFR, Estimated: >60 mL/min (ref >60)
Glucose, Bld: 99 mg/dL (ref 70–99)
Potassium: 4.7 mmol/L (ref 3.5–5.1)
Sodium: 135 mmol/L (ref 135–145)
Total Bilirubin: 0.6 mg/dL (ref 0.0–1.2)
Total Protein: 6.7 g/dL (ref 6.5–8.1)

## 2023-12-26 LAB — IRON AND TIBC
Iron: 68 ug/dL (ref 28–170)
Saturation Ratios: 18 % (ref 10.4–31.8)
TIBC: 370 ug/dL (ref 250–450)
UIBC: 302 ug/dL

## 2023-12-28 ENCOUNTER — Ambulatory Visit: Admission: RE | Admit: 2023-12-28 | Source: Ambulatory Visit

## 2023-12-28 ENCOUNTER — Inpatient Hospital Stay (HOSPITAL_BASED_OUTPATIENT_CLINIC_OR_DEPARTMENT_OTHER): Admitting: Oncology

## 2023-12-28 ENCOUNTER — Encounter: Payer: Self-pay | Admitting: Oncology

## 2023-12-28 ENCOUNTER — Ambulatory Visit

## 2023-12-28 VITALS — BP 130/80 | HR 60 | Temp 97.6°F | Resp 18 | Ht 64.0 in | Wt 196.0 lb

## 2023-12-28 DIAGNOSIS — N1831 Chronic kidney disease, stage 3a: Secondary | ICD-10-CM

## 2023-12-28 DIAGNOSIS — D649 Anemia, unspecified: Secondary | ICD-10-CM

## 2023-12-28 DIAGNOSIS — Z148 Genetic carrier of other disease: Secondary | ICD-10-CM

## 2023-12-28 NOTE — Progress Notes (Signed)
 Hematology/Oncology Progress note [phone] [phone]         Patient Care Team: Epifanio Alm SQUIBB, MD as PCP - General (Infectious Diseases) Babara Call, MD as Consulting Physician (Oncology)   CHIEF COMPLAINTS/REASON FOR VISIT:  Heterozygous hemachromatosis mutation, elevated ferritin.   ASSESSMENT & PLAN:   Hemochromatosis carrier heterozygous Cys282Tyr Lab Results  Component Value Date   HGB 11.1 (L) 12/26/2023   TIBC 370 12/26/2023   IRONPCTSAT 18 12/26/2023   FERRITIN 501 (H) 12/26/2023   Avoid alcohol  consumption, vitamin C supplementation.  Ferritin is slightly above 500, LFT is normal No need for phlebotomy for now.   CKD (chronic kidney disease) stage 3, GFR 30-59 ml/min (HCC) Encourage oral hydration and avoid nephrotoxins.    Anemia Likely due to CKD.  Observation,     Orders Placed This Encounter  Procedures   CBC with Differential (Cancer Center Only)    Standing Status:   Future    Expected Date:   06/29/2024    Expiration Date:   09/27/2024   Chromogranin A    Standing Status:   Future    Expected Date:   06/29/2024    Expiration Date:   09/27/2024   Iron  and TIBC    Standing Status:   Future    Expected Date:   06/29/2024    Expiration Date:   09/27/2024   Ferritin    Standing Status:   Future    Expected Date:   06/29/2024    Expiration Date:   09/27/2024   Follow up in 6 months All questions were answered. The patient knows to call the clinic with any problems, questions or concerns.  Call Babara, MD, PhD [facility] Hematology Oncology 12/28/2023     HISTORY OF PRESENTING ILLNESS:  Angela Fernandez is a  78 y.o.  female with PMH listed below who was referred to me for anemia Reviewed patient's recent labs that was done.  She has chronic macrocytic anemia since March 2024, Hb progressively decreased to 10.5 on 01/12/2023 10/13/22 iron  panel showed ferritin 870, iron  saturation 28.  She denies recent chest pain on  exertion, shortness of breath on minimal exertion, pre-syncopal episodes, or palpitations She had not noticed any recent bleeding such as epistaxis, hematuria or hematochezia.   She is on Plaquenil for calcium pyrophosphate deposition disease, she follows up with rheumatology  History of recurrent bladder infection, currently on Trimethoprim  long term for prophylaxis.    INTERVAL HISTORY Angela Fernandez is a 78 y.o. female who has above history reviewed by me today presents for follow up visit to review blood work up results.  She take oral iron  supplementation.  pancreatic insufficiency, was recommended by GI to take Creon .  + fatigue. Low glucose events.   MEDICAL HISTORY:  Past Medical History:  Diagnosis Date   Anemia    Arthritis    Bipolar affect, depressed (HCC)    COPD (chronic obstructive pulmonary disease) (HCC)    Diabetes mellitus without complication (HCC)    type II   GERD (gastroesophageal reflux disease)    Hyperlipidemia    Hypertension    Hypothyroidism    PTSD (post-traumatic stress disorder)    Schizoaffective disorder (HCC)    Sleep apnea    cpap   Stroke (HCC)    hx of  mini stroke - 2001   TIA (transient ischemic attack) 2010   UTI (urinary tract infection)     SURGICAL HISTORY: Past Surgical History:  Procedure Laterality Date  BILATERAL CARPAL TUNNEL RELEASE Bilateral    BREAST BIOPSY     BREAST SURGERY Right    lumpectomy   CATARACT EXTRACTION W/ INTRAOCULAR LENS  IMPLANT, BILATERAL Bilateral    CHOLECYSTECTOMY     COLONOSCOPY     ESOPHAGOGASTRODUODENOSCOPY     EYE SURGERY     JOINT REPLACEMENT     NASAL SINUS SURGERY     REPLACEMENT TOTAL KNEE BILATERAL Bilateral    REVERSE SHOULDER ARTHROPLASTY Right 09/02/2020   Procedure: REVERSE SHOULDER ARTHROPLASTY;  Surgeon: Edie Norleen PARAS, MD;  Location: [facility] ORS;  Service: Orthopedics;  Laterality: Right;   SHOULDER SURGERY Right 2020   rotator cuff repair   TOTAL SHOULDER REVISION Right  10/23/2021   Procedure: REVERSE SHOULDER REVISION;  Surgeon: Sharl Selinda Dover, MD;  Location: [facility] ORS;  Service: Orthopedics;  Laterality: Right;  180    SOCIAL HISTORY: Social History   Socioeconomic History   Marital status: Single    Spouse name: Not on file   Number of children: 1   Years of education: Not on file   Highest education level: Some college, no degree  Occupational History   Not on file  Tobacco Use   Smoking status: Former    Current packs/day: 0.00    Types: Cigarettes    Start date: 62    Quit date: 2021    Years since quitting: 4.6   Smokeless tobacco: Never  Vaping Use   Vaping status: Never Used  Substance and Sexual Activity   Alcohol  use: Not Currently    Comment: quit in age 21's or 39's   Drug use: Yes    Comment: last used in age 69's   Sexual activity: Not Currently  Other Topics Concern   Not on file  Social History Narrative   Moved from [place]; lives with daughter   Social Drivers of Health   Financial Resource Strain: Low Risk  (11/15/2023)   Received from [facility]   Overall Financial Resource Strain (CARDIA)    Difficulty of Paying Living Expenses: Not hard at all  Food Insecurity: No Food Insecurity (11/15/2023)   Received from [facility]   Hunger Vital Sign    Within the past 12 months, you worried that your food would run out before you got the money to buy more.: Never true    Within the past 12 months, the food you bought just didn't last and you didn't have money to get more.: Never true  Transportation Needs: No Transportation Needs (11/15/2023)   Received from [facility] - Transportation    In the past 12 months, has lack of transportation kept you from medical appointments or from getting medications?: No    Lack of Transportation (Non-Medical): No  Physical Activity: Not on file  Stress: Not on file  Social Connections: Moderately Isolated (02/24/2023)    Social Connection and Isolation Panel    Frequency of Communication with Friends and Family: More than three times a week    Frequency of Social Gatherings with Friends and Family: Never    Attends Religious Services: Never    Database administrator or Organizations: Yes    Attends Engineer, structural: More than 4 times per year    Marital Status: Divorced  Intimate Partner Violence: Not At Risk (01/26/2023)   Humiliation, Afraid, Rape, and Kick questionnaire    Fear of Current or Ex-Partner: No    Emotionally Abused: No  Physically Abused: No    Sexually Abused: No    FAMILY HISTORY: Family History  Problem Relation Age of Onset   Alzheimer's disease Mother    Alcohol  abuse Father    Schizophrenia Father    Pancreatitis Father    Alcohol  abuse Maternal Grandfather    Alzheimer's disease Maternal Grandmother     ALLERGIES:  has no known allergies.  MEDICATIONS:  Current Outpatient Medications  Medication Sig Dispense Refill   albuterol  (VENTOLIN  HFA) 108 (90 Base) MCG/ACT inhaler Inhale 2 puffs into the lungs every 6 (six) hours as needed for wheezing or shortness of breath. 8 g 2   Aloe-Sodium Chloride  (AYR SALINE NASAL GEL NA) Place 1 application. into the nose every other day.     [START ON 02/16/2024] buPROPion  (WELLBUTRIN  SR) 200 MG 12 hr tablet Take 1 tablet (200 mg total) by mouth 2 (two) times daily. 180 tablet 1   [START ON 01/12/2024] clonazePAM  (KLONOPIN ) 0.5 MG tablet Take 1 tablet (0.5 mg total) by mouth daily AND 2 tablets (1 mg total) every evening. for anxiety. 90 tablet 1   colchicine 0.6 MG tablet Take by mouth.     desvenlafaxine  (PRISTIQ ) 50 MG 24 hr tablet Take 1 tablet (50 mg total) by mouth daily. 90 tablet 1   diclofenac Sodium (VOLTAREN) 1 % GEL Apply topically.     docusate sodium  (COLACE) 100 MG capsule Take 200 mg by mouth daily. 2 tabs in the am     Doxepin  HCl 6 MG TABS Take 1 tablet (6 mg total) by mouth at bedtime as needed. 30 tablet 1    Dulaglutide  (TRULICITY ) 0.75 MG/0.5ML SOPN Inject 0.75 mg into the skin once a week. 3 mL 2   fenofibrate  (TRICOR ) 145 MG tablet Take 1 tablet (145 mg total) by mouth daily. 4pm 90 tablet 1   hydroxychloroquine (PLAQUENIL) 200 MG tablet Take 200 mg by mouth 2 (two) times daily.     insulin  glargine (LANTUS  SOLOSTAR) 100 UNIT/ML Solostar Pen Inject 24 Units into the skin daily.     levothyroxine  (SYNTHROID ) 175 MCG tablet Take 1 tablet (175 mcg total) by mouth daily before breakfast. 90 tablet 1   lidocaine -prilocaine (EMLA) cream Apply 1 Application topically.     lipase/protease/amylase (CREON ) 12000-38000 units CPEP capsule Take 12,000 Units by mouth as directed.     lisinopril  (ZESTRIL ) 5 MG tablet Take 1 tablet (5 mg total) by mouth daily. 90 tablet 1   Lurasidone  HCl 120 MG TABS Take 1 tablet (120 mg total) by mouth daily. 90 tablet 1   meloxicam  (MOBIC ) 7.5 MG tablet Take 1 tablet (7.5 mg total) by mouth daily. 90 tablet 0   metoprolol  succinate (TOPROL -XL) 100 MG 24 hr tablet Take 1 tablet (100 mg total) by mouth daily. Take with or immediately following a meal. 90 tablet 1   omeprazole  (PRILOSEC) 40 MG capsule Take 1 capsule (40 mg total) by mouth daily. 90 capsule 1   OVER THE COUNTER MEDICATION Take 1 Scoop by mouth daily in the afternoon. Collagen protein powder     pregabalin (LYRICA) 25 MG capsule Take 25 mg by mouth 2 (two) times daily.     simvastatin  (ZOCOR ) 20 MG tablet Take 1 tablet (20 mg total) by mouth daily. 90 tablet 1   tiZANidine  (ZANAFLEX ) 4 MG tablet Take 2-4 mg by mouth every 8 (eight) hours as needed for muscle spasms.     TRELEGY ELLIPTA  100-62.5-25 MCG/ACT AEPB INHALE 1 PUFF ONCE DAILY  triamcinolone  ointment (KENALOG ) 0.1 % Apply topically 2 (two) times daily.     trimethoprim  (TRIMPEX ) 100 MG tablet Take 1 tablet (100 mg total) by mouth daily. 90 tablet 3   umeclidinium-vilanterol (ANORO ELLIPTA ) 62.5-25 MCG/ACT AEPB Inhale 2 puffs into the lungs daily as  needed (shortness of breath).     Vibegron  (GEMTESA ) 75 MG TABS Take 1 tablet (75 mg total) by mouth daily. 90 tablet 3   VITAMIN E, TOPICAL, CREA Apply 1 application  topically daily.     zolpidem  (AMBIEN ) 5 MG tablet Take 1 tablet (5 mg total) by mouth at bedtime as needed for sleep. 30 tablet 2   cyclobenzaprine  (FLEXERIL ) 10 MG tablet Take 10 mg by mouth 3 (three) times daily as needed. (Patient not taking: Reported on 12/28/2023)     lipase/protease/amylase (CREON ) 36000 UNITS CPEP capsule Take 1 capsules with the first bite of each meal and 1 capsule with the first bite of each snack (Patient not taking: Reported on 12/28/2023) 150 capsule 1   No current facility-administered medications for this visit.    Review of Systems  Constitutional:  Positive for fatigue. Negative for appetite change, chills and fever.  HENT:   Negative for hearing loss and voice change.   Eyes:  Negative for eye problems.  Respiratory:  Negative for chest tightness and cough.   Cardiovascular:  Negative for chest pain.  Gastrointestinal:  Negative for abdominal distention, abdominal pain and blood in stool.  Endocrine: Negative for hot flashes.  Genitourinary:  Negative for difficulty urinating and frequency.   Musculoskeletal:  Positive for arthralgias.  Skin:  Negative for itching and rash.  Neurological:  Negative for extremity weakness.  Hematological:  Negative for adenopathy.  Psychiatric/Behavioral:  Negative for confusion.     PHYSICAL EXAMINATION: Vitals:   12/28/23 1013  BP: 130/80  Pulse: 60  Resp: 18  Temp: 97.6 F (36.4 C)  SpO2: 100%   Filed Weights   12/28/23 1013  Weight: 196 lb (88.9 kg)    Physical Exam Constitutional:      General: She is not in acute distress. HENT:     Head: Normocephalic and atraumatic.  Eyes:     General: No scleral icterus. Cardiovascular:     Rate and Rhythm: Normal rate and regular rhythm.     Heart sounds: Normal heart sounds.  Pulmonary:      Effort: Pulmonary effort is normal. No respiratory distress.     Breath sounds: No wheezing.  Abdominal:     General: Bowel sounds are normal. There is no distension.     Palpations: Abdomen is soft.  Musculoskeletal:        General: No deformity. Normal range of motion.     Cervical back: Normal range of motion and neck supple.  Skin:    General: Skin is warm and dry.     Findings: No erythema or rash.  Neurological:     Mental Status: She is alert and oriented to person, place, and time. Mental status is at baseline.     Cranial Nerves: No cranial nerve deficit.     Coordination: Coordination normal.  Psychiatric:        Mood and Affect: Mood normal.      LABORATORY DATA:  I have reviewed the data as listed    Latest Ref Rng & Units 12/26/2023    9:40 AM 08/22/2023   10:11 AM 01/26/2023   10:14 AM  CBC  WBC 4.0 - 10.5 K/uL 10.1  7.7  9.3   Hemoglobin 12.0 - 15.0 g/dL 88.8  88.3  87.7   Hematocrit 36.0 - 46.0 % 34.1  36.7  38.1   Platelets 150 - 400 K/uL 296  262  345       Latest Ref Rng & Units 12/26/2023    9:40 AM 08/22/2023   10:11 AM 01/26/2023   10:14 AM  CMP  Glucose 70 - 99 mg/dL 99  886  94   BUN 8 - 23 mg/dL 27  21  24    Creatinine 0.44 - 1.00 mg/dL 9.03  8.93  8.77   Sodium 135 - 145 mmol/L 135  139  136   Potassium 3.5 - 5.1 mmol/L 4.7  4.7  4.1   Chloride 98 - 111 mmol/L 102  104  102   CO2 22 - 32 mmol/L 24  25  26    Calcium 8.9 - 10.3 mg/dL 9.4  9.3  9.8   Total Protein 6.5 - 8.1 g/dL 6.7  6.5  7.8   Total Bilirubin 0.0 - 1.2 mg/dL 0.6  0.7  0.6   Alkaline Phos 38 - 126 U/L 19  20  19    AST 15 - 41 U/L 29  35  36   ALT 0 - 44 U/L 23  34  27    Lab Results  Component Value Date   IRON  68 12/26/2023   TIBC 370 12/26/2023   IRONPCTSAT 18 12/26/2023   FERRITIN 501 (H) 12/26/2023     RADIOGRAPHIC STUDIES: I have personally reviewed the radiological images as listed and agreed with the findings in the report. No results found.

## 2023-12-28 NOTE — Assessment & Plan Note (Addendum)
 heterozygous Cys282Tyr Lab Results  Component Value Date   HGB 11.1 (L) 12/26/2023   TIBC 370 12/26/2023   IRONPCTSAT 18 12/26/2023   FERRITIN 501 (H) 12/26/2023   Avoid alcohol  consumption, vitamin C supplementation.  Ferritin is slightly above 500, LFT is normal No need for phlebotomy for now.

## 2023-12-28 NOTE — Progress Notes (Signed)
 Patient has been feeling weak and lightheaded due to her Blood Sugar being so low, which her PCP did take her off of her insulin . Her taste has started losing her taste.

## 2023-12-28 NOTE — Assessment & Plan Note (Signed)
 Encourage oral hydration and avoid nephrotoxins.

## 2023-12-28 NOTE — Assessment & Plan Note (Signed)
 Likely due to CKD.  Observation,

## 2023-12-30 ENCOUNTER — Telehealth: Payer: Self-pay

## 2023-12-30 ENCOUNTER — Other Ambulatory Visit: Payer: Self-pay | Admitting: Psychiatry

## 2023-12-30 MED ORDER — RAMELTEON 8 MG PO TABS: 8.0000 mg | ORAL_TABLET | Freq: Every evening | ORAL | 1 refills | Status: DC | PRN

## 2023-12-30 NOTE — Telephone Encounter (Signed)
 Ordered Ramelteon  8 mg at night as needed for insomnia

## 2023-12-30 NOTE — Telephone Encounter (Signed)
 Patient called requesting a different sleeping medication informed patient that her insurance will not pay for the Doxepin  please advise

## 2023-12-30 NOTE — Telephone Encounter (Addendum)
 Patient called stating that the provider sent a medication to [facility] home delivery she was not able to tell me the name of the medication went through patients medication chart did not see any medication that had been sent to [facility] upon checking went to [facility] preferred pharmacy and [facility] is not [facility] in patients chart patient stated that she would call her daughter and see if she could tell her the name of the medication and she would call the office back

## 2023-12-30 NOTE — Telephone Encounter (Signed)
 Spoke to patient inform her of the medication sent to her pharmacy patient voiced understanding

## 2024-01-04 ENCOUNTER — Ambulatory Visit: Payer: Self-pay | Admitting: Psychiatry

## 2024-01-04 LAB — MONITOR DRUG PROFILE 10(MW)
Amphetamine Scrn, Ur: NEGATIVE ng/mL
BARBITURATE SCREEN URINE: NEGATIVE ng/mL
BENZODIAZEPINE SCREEN, URINE: NEGATIVE ng/mL
CANNABINOIDS UR QL SCN: NEGATIVE ng/mL
Cocaine (Metab) Scrn, Ur: NEGATIVE ng/mL
Creatinine(Crt), U: 59.7 mg/dL (ref 20.0–300.0)
Methadone Screen, Urine: NEGATIVE ng/mL
OXYCODONE+OXYMORPHONE UR QL SCN: NEGATIVE ng/mL
Opiate Scrn, Ur: NEGATIVE ng/mL
Ph of Urine: 6.6 (ref 4.5–8.9)
Phencyclidine Qn, Ur: NEGATIVE ng/mL
Propoxyphene Scrn, Ur: NEGATIVE ng/mL

## 2024-01-05 ENCOUNTER — Ambulatory Visit

## 2024-01-05 ENCOUNTER — Ambulatory Visit: Admitting: Licensed Clinical Social Worker

## 2024-01-05 ENCOUNTER — Other Ambulatory Visit

## 2024-01-12 ENCOUNTER — Ambulatory Visit
Admission: RE | Admit: 2024-01-12 | Discharge: 2024-01-12 | Disposition: A | Discharge status: Home / Self Care | Source: Ambulatory Visit | Attending: Emergency Medicine | Admitting: Emergency Medicine

## 2024-01-12 DIAGNOSIS — J439 Emphysema, unspecified: Secondary | ICD-10-CM | POA: Diagnosis present

## 2024-01-12 DIAGNOSIS — Z122 Encounter for screening for malignant neoplasm of respiratory organs: Secondary | ICD-10-CM | POA: Diagnosis present

## 2024-01-12 DIAGNOSIS — R0602 Shortness of breath: Secondary | ICD-10-CM | POA: Insufficient documentation

## 2024-01-12 DIAGNOSIS — Z87891 Personal history of nicotine dependence: Secondary | ICD-10-CM | POA: Diagnosis present

## 2024-01-13 ENCOUNTER — Telehealth: Payer: Self-pay | Admitting: Psychiatry

## 2024-01-13 ENCOUNTER — Other Ambulatory Visit: Payer: Self-pay | Admitting: Psychiatry

## 2024-01-13 NOTE — Telephone Encounter (Signed)
 She should have a refill at the pharmacy. Please verify with the pharmacy, and update the patient. thanks

## 2024-01-13 NOTE — Telephone Encounter (Signed)
 Spoke to Missy at patients pharmacy she stated that the patient had one on hold for the klonopin  informed dr hisada of the one on hold she verify and stated that it was ok to fill today. Called to update patient on medication refill no answer left voicemail for patient.

## 2024-01-22 ENCOUNTER — Emergency Department

## 2024-01-22 ENCOUNTER — Emergency Department
Admission: EM | Admit: 2024-01-22 | Discharge: 2024-01-22 | Discharge status: Against Medical Advice | Attending: Emergency Medicine | Admitting: Emergency Medicine

## 2024-01-22 ENCOUNTER — Other Ambulatory Visit: Payer: Self-pay

## 2024-01-22 DIAGNOSIS — R0602 Shortness of breath: Secondary | ICD-10-CM | POA: Insufficient documentation

## 2024-01-22 DIAGNOSIS — R0789 Other chest pain: Secondary | ICD-10-CM | POA: Diagnosis not present

## 2024-01-22 DIAGNOSIS — Z5321 Procedure and treatment not carried out due to patient leaving prior to being seen by health care provider: Secondary | ICD-10-CM | POA: Diagnosis not present

## 2024-01-22 LAB — CBC
HCT: 32.4 % — ABNORMAL LOW (ref 36.0–46.0)
Hemoglobin: 10.6 g/dL — ABNORMAL LOW (ref 12.0–15.0)
MCH: 32.2 pg (ref 26.0–34.0)
MCHC: 32.7 g/dL (ref 30.0–36.0)
MCV: 98.5 fL (ref 80.0–100.0)
Platelets: 264 K/uL (ref 150–400)
RBC: 3.29 MIL/uL — ABNORMAL LOW (ref 3.87–5.11)
RDW: 13.1 % (ref 11.5–15.5)
WBC: 9.9 K/uL (ref 4.0–10.5)
nRBC: 0 % (ref 0.0–0.2)

## 2024-01-22 LAB — BASIC METABOLIC PANEL WITH GFR
Anion gap: 8 (ref 5–15)
BUN: 39 mg/dL — ABNORMAL HIGH (ref 8–23)
CO2: 24 mmol/L (ref 22–32)
Calcium: 8.8 mg/dL — ABNORMAL LOW (ref 8.9–10.3)
Chloride: 99 mmol/L (ref 98–111)
Creatinine, Ser: 1.48 mg/dL — ABNORMAL HIGH (ref 0.44–1.00)
GFR, Estimated: 36 mL/min — ABNORMAL LOW (ref >60)
Glucose, Bld: 103 mg/dL — ABNORMAL HIGH (ref 70–99)
Potassium: 5.4 mmol/L — ABNORMAL HIGH (ref 3.5–5.1)
Sodium: 131 mmol/L — ABNORMAL LOW (ref 135–145)

## 2024-01-22 LAB — TROPONIN I (HIGH SENSITIVITY)
Troponin I (High Sensitivity): 5 ng/L (ref <18)
Troponin I (High Sensitivity): 5 ng/L (ref <18)

## 2024-01-22 NOTE — ED Notes (Signed)
 Patient to NF desk stating she needs to leave. Patient in NAD when exiting ER with family member.

## 2024-01-22 NOTE — ED Triage Notes (Signed)
 Patient wheeled to triage with complaints of chest pain and shortness of breath. Patient states when she went out to walk her dog earlier today she felt a heaviness in her chest and felt like she could not catch her breath. Denies previous cardiac hx, takes medications for HTN.

## 2024-01-23 ENCOUNTER — Other Ambulatory Visit
Admission: RE | Admit: 2024-01-23 | Discharge: 2024-01-23 | Disposition: A | Discharge status: Home / Self Care | Source: Ambulatory Visit | Attending: Physician Assistant | Admitting: Physician Assistant

## 2024-01-23 DIAGNOSIS — R0789 Other chest pain: Secondary | ICD-10-CM | POA: Diagnosis present

## 2024-01-23 LAB — TROPONIN I (HIGH SENSITIVITY): Troponin I (High Sensitivity): 4 ng/L (ref <18)

## 2024-01-23 NOTE — Discharge Instructions (Signed)

## 2024-01-24 ENCOUNTER — Ambulatory Visit
Admission: RE | Admit: 2024-01-24 | Discharge: 2024-01-24 | Disposition: A | Discharge status: Home / Self Care | Source: Ambulatory Visit | Attending: Physical Medicine & Rehabilitation | Admitting: Physical Medicine & Rehabilitation

## 2024-01-24 DIAGNOSIS — M5412 Radiculopathy, cervical region: Secondary | ICD-10-CM

## 2024-01-24 MED ORDER — TRIAMCINOLONE ACETONIDE 40 MG/ML IJ SUSP (RADIOLOGY)
60.0000 mg | Freq: Once | INTRAMUSCULAR | Status: AC
Start: 2024-01-24 — End: 2024-01-24
  Administered 2024-01-24: 60 mg via EPIDURAL

## 2024-01-24 MED ORDER — IOPAMIDOL (ISOVUE-300) INJECTION 61%
15.0000 mL | Freq: Once | INTRAVENOUS | Status: AC | PRN
  Administered 2024-01-24: 3 mL

## 2024-01-31 ENCOUNTER — Ambulatory Visit (INDEPENDENT_AMBULATORY_CARE_PROVIDER_SITE_OTHER): Admitting: Licensed Clinical Social Worker

## 2024-01-31 DIAGNOSIS — F411 Generalized anxiety disorder: Secondary | ICD-10-CM

## 2024-01-31 DIAGNOSIS — F4310 Post-traumatic stress disorder, unspecified: Secondary | ICD-10-CM

## 2024-01-31 DIAGNOSIS — F259 Schizoaffective disorder, unspecified: Secondary | ICD-10-CM | POA: Diagnosis not present

## 2024-01-31 NOTE — Progress Notes (Signed)
 THERAPIST PROGRESS NOTE  Progress Review   Session Time: 8-  8:59am  Participation Level: Active  Behavioral Response: CasualAlertEuthymic  Type of Therapy: Individual Therapy  Treatment Goals addressed:  Active     Anger Management     LTG: Sheneka will reduce the amount of anger-related incidents/outbursts by 50% as evidenced by self-report (Completed/Met)     Start:  03/16/23    Expected End:  05/01/24    Resolved:  01/31/24   01/31/24:     Goal Note     01/31/24: I don't have those awful feelings like I want to destroy everything.          STG: Ayshia will identify situations, thoughts, and feelings that trigger internal anger, and/or angry/aggressive actions as evidenced by self-report (Completed/Met)     Start:  03/16/23    Expected End:  05/01/24    Resolved:  01/31/24      Educate [name] on anger management skills and the rationale for learning these skills     Start:  03/16/23         Educate [name] on internal and external reinforcers of anger response and the rationale for identifying reinforcers of angry responses     Start:  03/16/23         Educate [name] on relaxation techniques and the rationale for learning these techniques     Start:  03/16/23         Encourage [name] to practice relaxation skills at home for 5 minutes, 1 times per day, for 10 days     Start:  03/16/23         Educate [name] on the 6 categories of cognitive distortions that promote anger (Blaming, Catastrophizing, Global Labeling, Misattributions, Overgeneralization, Demanding)      Start:  03/16/23         Educate [name] on coping skills to cope with anger arousal      Start:  03/16/23           BH CCP Acute or Chronic Trauma Reaction     LTG: Elimination of maladaptive behaviors and thinking patterns which interfere with resolution of trauma as evidenced by self report     Start:  01/31/24    Expected End:  05/01/24         STG: Have it not bother me  anymore AEB decreased physical symptoms such as nausea arise with unwanted memories from the past.      Start:  01/31/24    Expected End:  05/01/24         Cooperate with trauma-focused psychotherapy techniques to reduce emotional reaction to the traumatic event      Start:  01/31/24         Teach [name] coping strategies (e.g., writing down thoughts and feelings in a journal; taking deep, slow breaths; calling a support person to talk about memories) to deal with trauma memories and sudden emotional reactions without becoming emotionally nu     Start:  01/31/24         Educate [name] on trauma influenced cognitive distortions     Start:  01/31/24         Encourage Jacari to identify 2 trauma related cognitive distortions     Start:  01/31/24           OP Depression     LTG: Reduce frequency, intensity, and duration of depression symptoms so that daily functioning is improved (Progressing)     Start:  03/16/23    Expected End:  05/01/24       Goal Note     01/31/24: about the same.         STG: Sabrie will identify cognitive patterns and beliefs that support depression (Progressing)     Start:  03/16/23    Expected End:  05/01/24       Goal Note     01/31/24: Reports she has been utilize her container and reframing to chase away negative unhelpful thought patterns.          STG - Misc 1 (Not Applicable)     Start:  03/16/23    Expected End:  08/12/23    Resolved:  07/18/23   Pt reports she would like to expand her support system. Pt will accomplish this by calling friends starting out once a month form reports 0 times a month.     Goal Note     Blank         Work with [name] to identify the major components of a recent episode of depression: physical symptoms, major thoughts and images, and major behaviors they experienced     Start:  03/16/23         Therapist will educate patient on cognitive distortions and the rationale for treatment of  depression     Start:  03/16/23         Raizy will identify 3 trauma related cognitive distortions     Start:  03/16/23         Perform motivational interviewing regarding physical activity     Start:  03/16/23           Self Esteem:         STG - Other Self-Esteem Goal (Specify) (Progressing)     Start:  03/16/23    Expected End:  05/01/24      Pt will report improvements in self esteem    Goal Note     01/31/24: It's a lot better. 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 Increase showers from reported 1x/ every 5 days.          STG - Misc 1 (Initial)     Start:  01/31/24    Expected End:  05/01/24       Increase showers from reported 1x/ every 5 days.       Identify opportunities to increase self-esteem     Start:  03/16/23            Coping Skills      Start:  03/16/23      Will Work with patient to decrease the frequency of negative self-descriptive statements and increase the frequency of positive self- descriptive statements using CBT/DBT/REBT techniques per patient self report 3 out of 5 documented sessions. Some of the techniques that will be used will be CBT, positive affirmations, role playing, modeling, homework and journaling.            ProgressTowards Goals: Progressing  Interventions: Strength-based and Supportive  Summary: Niharika Savino is a 78 y.o. female who presents with symptoms of anxiety. Patient reports uncontrollable worry, irritability, nightmares, and racing thoughts. Pt was oriented times 5. Pt was cooperative and engaged. Pt denies SI/HI/AVH.   Cln utilized the first half of session to review patients progress. See progress notes documented above.   The clinician readministered the PHQ-9 and GAD-7 assessments. The patient's anxiety scores decreased from 17 to 9, and depression scores also decreased from 14 to 13.  The patient shares she has lost transportation, therefore, has been spending more time at home, alone and bored.  States, I itch myself constantly when I am around people like my daughter. Patient hypothesizes this may be due to her anxiety. Attributes progress to being outside with her dogs.  Reports she has not been able to swim as often due to physical health.   Cln readminstered the PCL5 with patients PTSD scored 31 . Reports nightmares 4x/wk of someone trying to kill her. Reports panic attacks as a result of flashbacks.   Clinician revisited patient's readiness to begin EMDR with patient consented to begin EMDR treatment.  Therefore, patient and clinician worked together to construct her target sequence plan.  Patient will begin processing this target sequence plan in the coming sessions.  Suicidal/Homicidal: Nowithout intent/plan  Therapist Response: Clinician utilized active and supportive reflection to create a safe environment for patient to process recent events. Clinician assessed for current stressors, symptoms, safety since last session.  Reflected on patient's progress in therapy by readministering the PHQ-9 and GAD-7 screeners.  Updated patient's treatment plan based on progress.  Constructed patient's target sequence plan to begin EMDR treatment.  Plan: Return again in 3 weeks.  Diagnosis: Schizoaffective disorder, unspecified type (HCC)  PTSD (post-traumatic stress disorder)  Anxiety state   Collaboration of Care: AEB psychiatrist can access notes and cln. Will review psychiatrists' notes. Check in with the patient and will see LCSW per availability. Patient agreed with treatment recommendations.   Patient/Guardian was advised Release of Information must be obtained prior to any record release in order to collaborate their care with an outside provider. Patient/Guardian was advised if they have not already done so to contact the registration department to sign all necessary forms in order for us  to release information regarding their care.   Consent: Patient/Guardian gives verbal  consent for treatment and assignment of benefits for services provided during this visit. Patient/Guardian expressed understanding and agreed to proceed.   Evalene KATHEE Husband, LCSW 01/31/2024

## 2024-02-07 NOTE — Progress Notes (Signed)
 Chief Complaint: Chief Complaint  Patient presents with  . Follow-up  Back pain traveling into the bilateral legs as well as neck pain traveling into the bilateral arms    HPI: Patient is a pleasant 78 y.o. female seen in follow-up for the evaluation of neck pain traveling into the bilateral arms and back pain traveling into the bilateral legs.  She is status post bilateral L5-S1 TFESI 01/17/2024 as well as C7-T1 IL ESI 01/24/2024.  She denies any problems or complications postprocedure.  She states that she does not remember her blood sugars postprocedure but states that they were normal the entire time.  She rates her pain as a 4/10.  Is intermittent, dull.  She does note weakness in her right arm which she thinks is from the shoulder replacement but denies any numbness/tingling, loss of control of bowel or bladder. Pain is chronic.  Pain is worse with bending, lifting, twisting, squatting, sitting and better with rest and ice.  She has been taking tizanidine  intermittently and denies any side effects on it.  She feels that it does help when she takes it.  Overall she is very happy the results of the epidural injections and does feel a lot more functional.  Review of Systems: A 10 point review of systems is negative, except for the pertinent positives and negatives detailed in the HPI.  PMH: Past Medical History:  Diagnosis Date  . Anemia   . Bipolar affect, depressed (CMS/HHS-HCC)   . COPD (chronic obstructive pulmonary disease) (CMS/HHS-HCC)   . Diabetes mellitus without complication (CMS/HHS-HCC)   . GERD (gastroesophageal reflux disease)   . Hyperlipidemia   . Hypertension   . Osteoarthritis   . PTSD (post-traumatic stress disorder)   . Schizoaffective disorder (CMS/HHS-HCC)   . Thyroid  disease      PSH: Past Surgical History:  Procedure Laterality Date  . Left total knee arthroplasty Left 05/30/2009  . Right total knee arthroplasty Right 09/05/2018  . Right shoulder  arthroscopy. Right arthroscopic biceps tenotomy. Arthroscopic subacromial decompression. Mini open rotator cuff repair of massive tear. Right 12/29/2018   Sherwood Shin, Md  .  Reverse right total shoulder arthroplasty Right 09/02/2020   Dr.Poggi  . INCISION TENDON SHEATH FOR TRIGGER FINGER Right 04/2022   middle finger  . BACK SURGERY     Fatty cyst removal  . Bilateral Hand Surgery    . Breast Surgery     Cyst Removal  . CATARACT EXTRACTION W/ INTRAOCULAR LENS IMPLANT & ANTERIOR VITRECTOMY, BILATERAL    . CHOLECYSTECTOMY    . CHOLECYSTECTOMY    . COLONOSCOPY    . FRACTURE SURGERY    . INCISION TENDON SHEATH FOR TRIGGER FINGER Right    By Dr. Kathlynn  . JOINT REPLACEMENT    . UPPER GASTROINTESTINAL ENDOSCOPY       Family History: Family History  Problem Relation Name Age of Onset  . Alzheimer's disease Mother    . Pancreatitis Father       Social History: Social History   Socioeconomic History  . Marital status: Single  Tobacco Use  . Smoking status: Former    Current packs/day: 0.00    Average packs/day: 1 pack/day for 55.0 years (55.0 ttl pk-yrs)    Types: Cigarettes    Start date: 8    Quit date: 05/23/2017    Years since quitting: 6.7  . Smokeless tobacco: Never  Vaping Use  . Vaping status: Never Used  Substance and Sexual Activity  . Alcohol  use: Not  Currently  . Drug use: Not Currently   Social Drivers of Health   Financial Resource Strain: Low Risk  (11/15/2023)   Overall Financial Resource Strain (CARDIA)   . Difficulty of Paying Living Expenses: Not hard at all  Food Insecurity: No Food Insecurity (11/15/2023)   Hunger Vital Sign   . Worried About Programme researcher, broadcasting/film/video in the Last Year: Never true   . Ran Out of Food in the Last Year: Never true  Transportation Needs: No Transportation Needs (11/15/2023)   PRAPARE - Transportation   . Lack of Transportation (Medical): No   . Lack of Transportation (Non-Medical): No     Allergies: No Known Allergies    Medications:  Current Outpatient Medications:  .  albuterol  MDI, PROVENTIL , VENTOLIN , PROAIR , HFA 90 mcg/actuation inhaler, INHALE 2 PUFFS BY MOUTH EVERY 4 HOURS AS NEEDED, Disp: 9 g, Rfl: 0 .  buPROPion  (WELLBUTRIN  SR) 200 MG SR tablet, Take 200 mg by mouth 2 (two) times daily, Disp: , Rfl:  .  clonazePAM  (KLONOPIN ) 1 MG tablet, Take by mouth Takes 1.5mg  in the am, 1/2mg  in the afternoon, and 1 mg in the pm for a total daily dose of 3.5mg ., Disp: , Rfl:  .  colchicine (COLCRYS) 0.6 mg tablet, Take 1 tablet (0.6 mg total) by mouth once daily for 180 days, Disp: 90 tablet, Rfl: 1 .  CREON  36,000-114,000- 180,000 unit DR capsule, TAKE ONE TABLET BY MOUTH WITH EACH MEAL AND SNACK UP TO FOUR TIMES DAILY AS DIRECTED, Disp: 150 capsule, Rfl: 1 .  desvenlafaxine  succinate (PRISTIQ ) 50 MG ER tablet, Take 50 mg by mouth at bedtime, Disp: , Rfl:  .  docusate (COLACE) 100 MG capsule, Take 200 mg by mouth 2 (two) times daily, Disp: , Rfl:  .  dulaglutide  (TRULICITY ) 0.75 mg/0.5 mL subcutaneous pen injector, Inject 0.5 mLs (0.75 mg total) subcutaneously once a week, Disp: 6 mL, Rfl: 3 .  fenofibrate  nanocrystallized (TRICOR ) 145 MG tablet, Take 1 tablet (145 mg total) by mouth once daily, Disp: 90 tablet, Rfl: 3 .  fluticasone-umeclidinium-vilanterol (TRELEGY ELLIPTA ) 100-62.5-25 mcg inhaler, INHALE 1 PUFF ONCE DAILY, Disp: 180 each, Rfl: 1 .  GEMTESA  75 mg Tab, Take 1 tablet by mouth once daily, Disp: , Rfl:  .  hydroxychloroquine (PLAQUENIL) 200 mg tablet, Take 1 tablet (200 mg total) by mouth 2 (two) times daily with meals for 180 days, Disp: 180 tablet, Rfl: 1 .  levothyroxine  (SYNTHROID ) 150 MCG tablet, Take 1 tablet by mouth once daily, Disp: 90 tablet, Rfl: 0 .  lisinopriL  (ZESTRIL ) 5 MG tablet, Take 1 tablet by mouth once daily, Disp: 90 tablet, Rfl: 0 .  lurasidone  (LATUDA ) 120 mg tablet, Take 1 tablet by mouth daily with breakfast, Disp: , Rfl:  .  meloxicam  (MOBIC ) 7.5 MG tablet, Take 1 tablet by  mouth once daily, Disp: 90 tablet, Rfl: 0 .  metoprolol  SUCCinate (TOPROL -XL) 100 MG XL tablet, Take 1 tablet by mouth once daily, Disp: 90 tablet, Rfl: 0 .  omeprazole  (PRILOSEC) 40 MG DR capsule, Take 1 capsule (40 mg total) by mouth once daily as needed, Disp: 90 capsule, Rfl: 3 .  ondansetron  (ZOFRAN -ODT) 4 MG disintegrating tablet, Take 1 tablet (4 mg total) by mouth every 8 (eight) hours as needed, Disp: 20 tablet, Rfl: 2 .  ONETOUCH DELICA PLUS LANCET, 1 each 3 (three) times daily, Disp: , Rfl:  .  pregabalin (LYRICA) 25 MG capsule, Take 1 capsule (25 mg total) by mouth 2 (two)  times daily, Disp: 60 capsule, Rfl: 2 .  simvastatin  (ZOCOR ) 20 MG tablet, Take 1 tablet by mouth once daily, Disp: 90 tablet, Rfl: 2 .  tiZANidine  (ZANAFLEX ) 4 MG tablet, TAKE 1/2 TO 1 (ONE-HALF TO ONE) TABLET BY MOUTH TWICE DAILY AS NEEDED FOR MUSCLE SPASM, Disp: 60 tablet, Rfl: 5 .  triamcinolone  0.1 % ointment, Apply topically 2 (two) times daily, Disp: 30 g, Rfl: 0 .  trimethoprim  100 mg tablet, Take 100 mg by mouth once daily, Disp: , Rfl:  .  vitamin E Crea, Apply topically, Disp: , Rfl:  .  zolpidem  (AMBIEN ) 5 MG tablet, , Disp: , Rfl:  .  blood glucose diagnostic test strip, 1 each (1 strip total) 3 (three) times daily Use as instructed., Disp: 100 each, Rfl: 11 .  pen needle, diabetic 31 gauge x 5/16 needle, Use as directed, Disp: 100 each, Rfl: 5  Vitals: Vitals:   02/07/24 1115  BP: 133/61  Pulse: 56  Temp: 36.4 C (97.6 F)  TempSrc: Oral  Weight: 87 kg (191 lb 12.8 oz)  Height: 162.6 cm (5' 4.02)  PainSc: 0-No pain  PainLoc: Back   Imaging: MRI cervical spine done at [facility] 03/2021: C2-3 moderate bilateral NFS, severe facet arthropathy; C3-4 severe bilateral NFS and severe facet arthropathy, mild CCS; C4-5 mild right NFS; C5-6 mild bilateral NFS  MRI lumbar spine 03/2021 done at [facility]: L3-4 mild CCS; L4-5 severe CCS and facet arthropathy; L5-S1 severe facet arthropathy, mild CCS,  severe left and moderate right LRS  EMG bilateral upper extremities done by Dr. Lane 08/31/2023: Chronic mild bilateral carpal tunnel syndrome  GFR 43 drawn on 01/23/2024 Platelet count 254 drawn on 01/23/2024  Assessment: Cervicalgia  Cervical radiculitis -s/p C7-T1 IL ESI 09/25/2021 with 50% relief -s/p left C5-6 TFESI 12/28/2022 with 75% improvement -s/p C7-T1 IL ESI 01/24/2024 with 90% relief  Chronic low back pain with bilateral lumbar radiculitis -s/p bilateral L5-S1 TFESI 11/23/2022 with 90% relief -s/p bilateral L5-S1 TFESI 07/05/2023 with 50% improvement -s/p bilateral L5-S1 TFESI with Celestone  08/09/2023 with 65% improvement -s/p bilateral L3, L4 medial branch and dorsal RFA 11/14/2023 with 50% relief -s/p bilateral L5-S1 TFESI with Celestone  01/17/2024 with 90% improvement  History of diabetes with libre with hemoglobin A1c 5.3 drawn on 11/08/2023  History of alcohol  abuse per patient  Trigger finger -Per Ortho  Status post right total shoulder arthroplasty 10/23/2021  Right arm weakness/hand clumsiness per patient   Plan: She denies any problems or complications postsurgical or lumbar epidural steroid injection.  She states that she does not remember her blood sugars postprocedure but they remained within normal values the whole time.  Overall she is very happy the results of both injections and does feel a lot more functional.  No need for further epidural injections at this time.  I do recommend a continued home exercise program.  I did review the hemoglobin A1c from 11/08/2023, CBC and CMP from 01/23/2024.  Please see pertinent values above.  She has been discharged from the physical therapy 06/20/2023.  She notes minimal improvement but continues with a home exercise program.    I have prescribed her tizanidine .  She does not need refills.  She does note significant improvement with the tizanidine . She may continue to take this as needed muscle spasms.     In terms of her right  arm weakness/clumsiness, I did order an EMG of the right upper extremity which did show a chronic bilateral mild carpal tunnel syndrome.  In terms of the right arm weakness at the shoulder, I do defer that to Ortho and told her to talk with her surgeon regarding it.  Follow-up as needed. She may call back for bilateral L5-S1 TFESI with Celestone  no sooner than 04/17/2024.  She may call back for C7-T1 IL ESI no sooner than 04/24/2024.  Blood sugar should be checked with her libre.  Would also consider referral back to neurosurgery.    Patient agrees with above plan.  Answered all questions.

## 2024-02-12 ENCOUNTER — Other Ambulatory Visit: Payer: Self-pay | Admitting: Psychiatry
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
## 2024-02-20 ENCOUNTER — Other Ambulatory Visit: Payer: Self-pay

## 2024-02-20 ENCOUNTER — Ambulatory Visit (INDEPENDENT_AMBULATORY_CARE_PROVIDER_SITE_OTHER): Admitting: Psychiatry

## 2024-02-20 ENCOUNTER — Encounter: Payer: Self-pay | Admitting: Psychiatry

## 2024-02-20 VITALS — BP 120/72 | HR 61 | Temp 96.8°F | Ht 65.0 in | Wt 195.4 lb

## 2024-02-20 DIAGNOSIS — F411 Generalized anxiety disorder: Secondary | ICD-10-CM | POA: Diagnosis not present

## 2024-02-20 DIAGNOSIS — F4310 Post-traumatic stress disorder, unspecified: Secondary | ICD-10-CM | POA: Diagnosis not present

## 2024-02-20 DIAGNOSIS — F1320 Sedative, hypnotic or anxiolytic dependence, uncomplicated: Secondary | ICD-10-CM | POA: Diagnosis not present

## 2024-02-20 DIAGNOSIS — G4700 Insomnia, unspecified: Secondary | ICD-10-CM

## 2024-02-20 DIAGNOSIS — F259 Schizoaffective disorder, unspecified: Secondary | ICD-10-CM

## 2024-02-20 MED ORDER — RAMELTEON 8 MG PO TABS: 8.0000 mg | ORAL_TABLET | Freq: Every evening | ORAL | 0 refills | Status: DC | PRN

## 2024-02-20 MED ORDER — DESVENLAFAXINE SUCCINATE ER 50 MG PO TB24: 50.0000 mg | ORAL_TABLET | Freq: Every day | ORAL | 1 refills | Status: AC

## 2024-02-20 MED ORDER — CLONAZEPAM 0.5 MG PO TABS: ORAL_TABLET | ORAL | 0 refills | Status: DC

## 2024-02-20 NOTE — Patient Instructions (Signed)
 Continue Pristiq  50 mg every day Continue bupropion  200 mg twice a day Continue Latuda  120 mg at night  Continue clonazepam  0.5 mg in AM, 1 mg at night  Continue ramelteon  8 mg at night as needed for insomnia Next appointment: 12/2 at 8:30,

## 2024-02-22 ENCOUNTER — Ambulatory Visit (INDEPENDENT_AMBULATORY_CARE_PROVIDER_SITE_OTHER): Admitting: Licensed Clinical Social Worker

## 2024-02-22 DIAGNOSIS — Z91199 Patient's noncompliance with other medical treatment and regimen due to unspecified reason: Secondary | ICD-10-CM

## 2024-02-22 NOTE — Progress Notes (Unsigned)
   THERAPIST PROGRESS NOTE  Year Progress Review   Session Time: ***  Participation Level: {B[facility] PARTICIPATION LEVEL:22264}  Behavioral Response: Casual, Alert, Euthymic  Type of Therapy: Individual Therapy  Treatment Goals addressed: ***  ProgressTowards Goals: Progressing  Interventions: {CHL AMB BH Type of Intervention:21022753}  Summary: Angela Fernandez is a 78 y.o. female who presents with ***.   Suicidal/Homicidal: Nowithout intent/plan  Therapist Response: ***  Plan: Return again in 3 weeks.  Diagnosis: No diagnosis found.  Collaboration of Care: AEB psychiatrist can access notes and cln. Will review psychiatrists' notes. Check in with the patient and will see LCSW per availability. Patient agreed with treatment recommendations.   Patient/Guardian was advised Release of Information must be obtained prior to any record release in order to collaborate their care with an outside provider. Patient/Guardian was advised if they have not already done so to contact the registration department to sign all necessary forms in order for us  to release information regarding their care.   Consent: Patient/Guardian gives verbal consent for treatment and assignment of benefits for services provided during this visit. Patient/Guardian expressed understanding and agreed to proceed.   Angela KATHEE Husband, LCSW 02/22/2024

## 2024-02-24 ENCOUNTER — Encounter: Payer: Self-pay | Admitting: Licensed Clinical Social Worker

## 2024-03-06 NOTE — Progress Notes (Unsigned)
 THERAPIST PROGRESS NOTE  1 Year Review  Session Time: ***  Participation Level: Active  Behavioral Response: CasualAlertEuthymic  Type of Therapy: Individual Therapy  Treatment Goals addressed:  Active     Anger Management     LTG: Angela Fernandez will reduce the amount of anger-related incidents/outbursts by 50% as evidenced by self-report (Completed/Met)     Start:  03/16/23    Expected End:  05/01/24    Resolved:  01/31/24   01/31/24:     Goal Note     01/31/24: I don't have those awful feelings like I want to destroy everything.          STG: Angela Fernandez will identify situations, thoughts, and feelings that trigger internal anger, and/or angry/aggressive actions as evidenced by self-report (Completed/Met)     Start:  03/16/23    Expected End:  05/01/24    Resolved:  01/31/24      Educate Angela Fernandez on anger management skills and the rationale for learning these skills     Start:  03/16/23         Educate Angela Fernandez on internal and external reinforcers of anger response and the rationale for identifying reinforcers of angry responses     Start:  03/16/23         Educate Angela Fernandez on relaxation techniques and the rationale for learning these techniques     Start:  03/16/23         Encourage Angela Fernandez to practice relaxation skills at home for 5 minutes, 1 times per day, for 10 days     Start:  03/16/23         Educate Angela Fernandez on the 6 categories of cognitive distortions that promote anger (Blaming, Catastrophizing, Global Labeling, Misattributions, Overgeneralization, Demanding)      Start:  03/16/23         Educate Angela Fernandez on coping skills to cope with anger arousal      Start:  03/16/23           BH CCP Acute or Chronic Trauma Reaction     LTG: Angela Fernandez which interfere with resolution of trauma as evidenced by self report     Start:  01/31/24    Expected End:  05/01/24         STG: Have it not bother me anymore AEB  decreased physical symptoms such as nausea arise with unwanted memories from the past.      Start:  01/31/24    Expected End:  05/01/24         Cooperate with trauma-focused psychotherapy techniques to reduce emotional reaction to the traumatic event      Start:  01/31/24         Teach Angela Fernandez coping strategies (e.g., writing down thoughts and feelings in a journal; taking deep, slow breaths; calling a support person to talk about memories) to deal with trauma memories and sudden emotional reactions without becoming emotionally nu     Start:  01/31/24         Educate Angela Fernandez on trauma influenced cognitive distortions     Start:  01/31/24         Encourage Angela Fernandez to identify 2 trauma related cognitive distortions     Start:  01/31/24           OP Depression     LTG: Reduce frequency, intensity, and duration of depression symptoms so that daily functioning is improved (Progressing)     Start:  03/16/23  Expected End:  05/01/24       Goal Note     01/31/24: about the same.         STG: Angela Fernandez will identify cognitive Fernandez and beliefs that support depression (Progressing)     Start:  03/16/23    Expected End:  05/01/24       Goal Note     01/31/24: Reports she has been utilize her container and reframing to chase away negative unhelpful thought Fernandez.          STG - Misc 1 (Not Applicable)     Start:  03/16/23    Expected End:  08/12/23    Resolved:  07/18/23   Pt reports she would like to expand her support system. Pt will accomplish this by calling friends starting out once a month form reports 0 times a month.     Goal Note     Blank         Work with Angela Fernandez to identify the major components of a recent episode of depression: physical symptoms, major thoughts and images, and major behaviors they experienced     Start:  03/16/23         Therapist will educate patient on cognitive distortions and the rationale for treatment of depression      Start:  03/16/23         Angela Fernandez will identify 3 trauma related cognitive distortions     Start:  03/16/23         Perform motivational interviewing regarding physical activity     Start:  03/16/23           Self Esteem:         STG - Other Self-Esteem Goal (Specify) (Progressing)     Start:  03/16/23    Expected End:  05/01/24      Pt will report improvements in self esteem    Goal Note     01/31/24: It's a lot better. 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 Increase showers from reported 1x/ every 5 days.          STG - Misc 1 (Initial)     Start:  01/31/24    Expected End:  05/01/24       Increase showers from reported 1x/ every 5 days.       Identify opportunities to increase self-esteem     Start:  03/16/23            Coping Skills      Start:  03/16/23      Will Work with patient to decrease the frequency of negative self-descriptive statements and increase the frequency of positive self- descriptive statements using CBT/DBT/REBT techniques per patient self report 3 out of 5 documented sessions. Some of the techniques that will be used will be CBT, positive affirmations, role playing, modeling, homework and journaling.           ProgressTowards Goals: Progressing  Interventions: {CHL AMB BH Type of Intervention:[id number]}  Summary: Angela Fernandez is a 78 y.o. female who presents with symptoms of anxiety. Patient reports uncontrollable worry, irritability, nightmares, and racing thoughts. Pt was oriented times 5. Pt was cooperative and engaged. Pt denies SI/HI/AVH.   Suicidal/Homicidal: Nowithout intent/plan  Therapist Response:  Clinician utilized active and supportive reflection to create a safe environment for patient to process recent events. Clinician assessed for current stressors, symptoms, safety since last session.   Plan: Return again in 3 weeks.  Diagnosis:Schizoaffective disorder, unspecified  type [facility])  PTSD (post-traumatic stress  disorder)  Anxiety state   Collaboration of Care: AEB psychiatrist can access notes and cln. Will review psychiatrists' notes. Check in with the patient and will see LCSW per availability. Patient agreed with treatment recommendations.   Patient/Guardian was advised Release of Information must be obtained prior to any record release in order to collaborate their care with an outside provider. Patient/Guardian was advised if they have not already done so to contact the registration department to sign all necessary forms in order for us  to release information regarding their care.   Consent: Patient/Guardian gives verbal consent for treatment and assignment of benefits for services provided during this visit. Patient/Guardian expressed understanding and agreed to proceed.   Evalene KATHEE Husband, LCSW 03/06/2024

## 2024-03-08 ENCOUNTER — Ambulatory Visit: Admitting: Licensed Clinical Social Worker

## 2024-03-08 DIAGNOSIS — F259 Schizoaffective disorder, unspecified: Secondary | ICD-10-CM

## 2024-03-08 DIAGNOSIS — F411 Generalized anxiety disorder: Secondary | ICD-10-CM

## 2024-03-08 DIAGNOSIS — F4310 Post-traumatic stress disorder, unspecified: Secondary | ICD-10-CM

## 2024-03-13 ENCOUNTER — Encounter: Payer: Self-pay | Admitting: Licensed Clinical Social Worker

## 2024-03-15 ENCOUNTER — Ambulatory Visit (INDEPENDENT_AMBULATORY_CARE_PROVIDER_SITE_OTHER): Admitting: Licensed Clinical Social Worker

## 2024-03-15 DIAGNOSIS — F4310 Post-traumatic stress disorder, unspecified: Secondary | ICD-10-CM

## 2024-03-15 DIAGNOSIS — F411 Generalized anxiety disorder: Secondary | ICD-10-CM | POA: Diagnosis not present

## 2024-03-15 DIAGNOSIS — F259 Schizoaffective disorder, unspecified: Secondary | ICD-10-CM | POA: Diagnosis not present
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
## 2024-03-21 ENCOUNTER — Other Ambulatory Visit: Payer: Self-pay | Admitting: Cardiology

## 2024-03-21 DIAGNOSIS — E119 Type 2 diabetes mellitus without complications: Secondary | ICD-10-CM

## 2024-03-21 DIAGNOSIS — R0789 Other chest pain: Secondary | ICD-10-CM

## 2024-03-21 DIAGNOSIS — R0609 Other forms of dyspnea: Secondary | ICD-10-CM

## 2024-03-21 DIAGNOSIS — I10 Essential (primary) hypertension: Secondary | ICD-10-CM

## 2024-03-21 DIAGNOSIS — I2510 Atherosclerotic heart disease of native coronary artery without angina pectoris: Secondary | ICD-10-CM

## 2024-03-21 DIAGNOSIS — E782 Mixed hyperlipidemia: Secondary | ICD-10-CM

## 2024-03-21 DIAGNOSIS — Z87891 Personal history of nicotine dependence: Secondary | ICD-10-CM

## 2024-03-21 DIAGNOSIS — R9439 Abnormal result of other cardiovascular function study: Secondary | ICD-10-CM

## 2024-03-21 DIAGNOSIS — J439 Emphysema, unspecified: Secondary | ICD-10-CM

## 2024-04-02 ENCOUNTER — Other Ambulatory Visit: Payer: Self-pay | Admitting: Cardiology

## 2024-04-02 DIAGNOSIS — R0609 Other forms of dyspnea: Secondary | ICD-10-CM

## 2024-04-02 NOTE — Progress Notes (Signed)
 Orders only for Cardiac PET stress test.   Danita Bloch, PA-C

## 2024-04-03 ENCOUNTER — Ambulatory Visit: Admitting: Licensed Clinical Social Worker

## 2024-04-08 ENCOUNTER — Other Ambulatory Visit: Payer: Self-pay | Admitting: Psychiatry

## 2024-04-10 ENCOUNTER — Ambulatory Visit (INDEPENDENT_AMBULATORY_CARE_PROVIDER_SITE_OTHER): Admitting: Licensed Clinical Social Worker

## 2024-04-10 ENCOUNTER — Encounter (HOSPITAL_COMMUNITY): Payer: Self-pay

## 2024-04-10 DIAGNOSIS — F411 Generalized anxiety disorder: Secondary | ICD-10-CM | POA: Diagnosis not present

## 2024-04-10 DIAGNOSIS — F259 Schizoaffective disorder, unspecified: Secondary | ICD-10-CM

## 2024-04-10 DIAGNOSIS — F4310 Post-traumatic stress disorder, unspecified: Secondary | ICD-10-CM | POA: Diagnosis not present

## 2024-04-10 NOTE — Progress Notes (Signed)
 THERAPIST PROGRESS NOTE  Session Time: 11:03-11:48am  Participation Level: Active  Behavioral Response: CasualAlertEuthymicAnxious  Type of Therapy: Individual Therapy  Treatment Goals addressed:  Active     Anger Management     LTG: Treniya will reduce the amount of anger-related incidents/outbursts by 50% as evidenced by self-report (Completed/Met)     Start:  03/16/23    Expected End:  05/01/24    Resolved:  01/31/24   01/31/24:     Goal Note     01/31/24: I don't have those awful feelings like I want to destroy everything.          STG: Danyale will identify situations, thoughts, and feelings that trigger internal anger, and/or angry/aggressive actions as evidenced by self-report (Completed/Met)     Start:  03/16/23    Expected End:  05/01/24    Resolved:  01/31/24      Educate [name] on anger management skills and the rationale for learning these skills     Start:  03/16/23         Educate [name] on internal and external reinforcers of anger response and the rationale for identifying reinforcers of angry responses     Start:  03/16/23         Educate [name] on relaxation techniques and the rationale for learning these techniques     Start:  03/16/23         Encourage [name] to practice relaxation skills at home for 5 minutes, 1 times per day, for 10 days     Start:  03/16/23         Educate [name] on the 6 categories of cognitive distortions that promote anger (Blaming, Catastrophizing, Global Labeling, Misattributions, Overgeneralization, Demanding)      Start:  03/16/23         Educate [name] on coping skills to cope with anger arousal      Start:  03/16/23           BH CCP Acute or Chronic Trauma Reaction     LTG: Elimination of maladaptive behaviors and thinking patterns which interfere with resolution of trauma as evidenced by self report     Start:  01/31/24    Expected End:  05/01/24         STG: Have it not bother me anymore AEB  decreased physical symptoms such as nausea arise with unwanted memories from the past.      Start:  01/31/24    Expected End:  05/01/24         Cooperate with trauma-focused psychotherapy techniques to reduce emotional reaction to the traumatic event      Start:  01/31/24         Teach [name] coping strategies (e.g., writing down thoughts and feelings in a journal; taking deep, slow breaths; calling a support person to talk about memories) to deal with trauma memories and sudden emotional reactions without becoming emotionally nu     Start:  01/31/24         Educate [name] on trauma influenced cognitive distortions     Start:  01/31/24         Encourage Dellie to identify 2 trauma related cognitive distortions     Start:  01/31/24           OP Depression     LTG: Reduce frequency, intensity, and duration of depression symptoms so that daily functioning is improved (Progressing)     Start:  03/16/23    Expected End:  05/01/24       Goal Note     01/31/24: about the same.         STG: Lucindia will identify cognitive patterns and beliefs that support depression (Progressing)     Start:  03/16/23    Expected End:  05/01/24       Goal Note     01/31/24: Reports she has been utilize her container and reframing to chase away negative unhelpful thought patterns.          STG - Misc 1 (Not Applicable)     Start:  03/16/23    Expected End:  08/12/23    Resolved:  07/18/23   Pt reports she would like to expand her support system. Pt will accomplish this by calling friends starting out once a month form reports 0 times a month.     Goal Note     Blank         Work with [name] to identify the major components of a recent episode of depression: physical symptoms, major thoughts and images, and major behaviors they experienced     Start:  03/16/23         Therapist will educate patient on cognitive distortions and the rationale for treatment of depression      Start:  03/16/23         Jessyca will identify 3 trauma related cognitive distortions     Start:  03/16/23         Perform motivational interviewing regarding physical activity     Start:  03/16/23           Self Esteem:         STG - Other Self-Esteem Goal (Specify) (Progressing)     Start:  03/16/23    Expected End:  05/01/24      Pt will report improvements in self esteem    Goal Note     01/31/24: It's a lot better. 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 Increase showers from reported 1x/ every 5 days.          STG - Misc 1 (Initial)     Start:  01/31/24    Expected End:  05/01/24       Increase showers from reported 1x/ every 5 days.       Identify opportunities to increase self-esteem     Start:  03/16/23            Coping Skills      Start:  03/16/23      Will Work with patient to decrease the frequency of negative self-descriptive statements and increase the frequency of positive self- descriptive statements using CBT/DBT/REBT techniques per patient self report 3 out of 5 documented sessions. Some of the techniques that will be used will be CBT, positive affirmations, role playing, modeling, homework and journaling.           ProgressTowards Goals: Progressing  Interventions: Solution Focused, Assertiveness Training, and Supportive  Summary: Shantrice Rodenberg is a 78 y.o. female who presents with symptoms of anxiety. Patient reports uncontrollable worry, irritability, nightmares, and racing thoughts. Pt was oriented times 5. Pt was cooperative and engaged. Pt denies SI/HI/AVH.  The patient attended the session reporting that she experienced auditory hallucinations twice in the past week. These episodes included loud noises and hearing people talk while she was alone at home. She mentioned that there was a time when she was not taking her medication as prescribed; however, she has  shown improvement in medication compliance over the past few months. The  patient also noted that her mind has been racing, and she denies utilizing her coping skills. The clinician worked with her to address compliance with these coping strategies and identified solution-focused interventions to enhance her adherence.  Additionally, the patient expressed distress related to memory issues. She mentioned difficulty accepting changes in her capabilities and is reluctant to accept help from her family. Although she has a family history of Alzheimer's disease and has sought medical evaluation for a possible diagnosis, it was reported that she does not currently qualify for an Alzheimer's diagnosis, despite her family history. Instead, the patient was encouraged to maintain a daily diary, though she has not been compliant with this practice.  Due to stressors in her home environment, the patient has been experiencing panic attacks, during which she feels as though she cannot breathe, often triggered by a fear of her granddaughter's dog escaping the house. The clinician collaborated with the patient to identify ways for her to use assertive communication to express her feelings and concerns with her grandchildren. However, the patient is apprehensive about expressing these concerns, fearing that her grandchildren may view her as incapable of caring for the dogs. She believes that the only way to improve her anxiety is to move out on her own.  With the patient's permission, the clinician communicated concerns regarding the worsening of her schizoaffective symptoms to a psychiatrist via [employer].  Suicidal/Homicidal: Nowithout intent/plan  Therapist Response: Clinician utilized active and supportive reflection to create a safe environment for patient to process recent events. Clinician assessed for current stressors, symptoms, safety since last session.  Reflected on compliance with coping skills.  Patient was provided with a printout of coping skills to practice routinely.   Engage in solution focused interventions to address compliance with both coping skills and medications.  Explored use of assertive communication to address anxiety stressors.  Plan: Return again in 3 weeks.  Diagnosis: Schizoaffective disorder, unspecified type (HCC)  PTSD (post-traumatic stress disorder)  GAD (generalized anxiety disorder)   Collaboration of Care: AEB psychiatrist can access notes and cln. Will review psychiatrists' notes. Check in with the patient and will see LCSW per availability. Patient agreed with treatment recommendations.   Patient/Guardian was advised Release of Information must be obtained prior to any record release in order to collaborate their care with an outside provider. Patient/Guardian was advised if they have not already done so to contact the registration department to sign all necessary forms in order for us  to release information regarding their care.   Consent: Patient/Guardian gives verbal consent for treatment and assignment of benefits for services provided during this visit. Patient/Guardian expressed understanding and agreed to proceed.   Evalene KATHEE Husband, LCSW 04/10/2024

## 2024-04-12 ENCOUNTER — Ambulatory Visit
Admission: RE | Admit: 2024-04-12 | Discharge: 2024-04-12 | Disposition: A | Source: Ambulatory Visit | Attending: Cardiology

## 2024-04-12 ENCOUNTER — Ambulatory Visit: Admitting: Licensed Clinical Social Worker

## 2024-04-12 DIAGNOSIS — R0789 Other chest pain: Secondary | ICD-10-CM

## 2024-04-12 DIAGNOSIS — E782 Mixed hyperlipidemia: Secondary | ICD-10-CM

## 2024-04-12 DIAGNOSIS — I10 Essential (primary) hypertension: Secondary | ICD-10-CM

## 2024-04-12 DIAGNOSIS — Z87891 Personal history of nicotine dependence: Secondary | ICD-10-CM

## 2024-04-12 DIAGNOSIS — R9439 Abnormal result of other cardiovascular function study: Secondary | ICD-10-CM

## 2024-04-12 DIAGNOSIS — J439 Emphysema, unspecified: Secondary | ICD-10-CM

## 2024-04-12 DIAGNOSIS — E119 Type 2 diabetes mellitus without complications: Secondary | ICD-10-CM

## 2024-04-12 DIAGNOSIS — R0609 Other forms of dyspnea: Secondary | ICD-10-CM

## 2024-04-12 DIAGNOSIS — I2510 Atherosclerotic heart disease of native coronary artery without angina pectoris: Secondary | ICD-10-CM

## 2024-04-12 MED ORDER — REGADENOSON 0.4 MG/5ML IV SOLN
INTRAVENOUS | Status: AC
  Filled 2024-04-12: qty 5

## 2024-04-12 MED ORDER — RUBIDIUM RB82 GENERATOR (RUBYFILL)
25.0000 | PACK | Freq: Once | INTRAVENOUS | Status: AC
  Administered 2024-04-12: 23.38 via INTRAVENOUS

## 2024-04-12 MED ORDER — REGADENOSON 0.4 MG/5ML IV SOLN
0.4000 mg | Freq: Once | INTRAVENOUS | Status: AC
  Administered 2024-04-12: 0.4 mg via INTRAVENOUS
  Filled 2024-04-12: qty 5

## 2024-04-12 MED ORDER — RUBIDIUM RB82 GENERATOR (RUBYFILL)
25.0000 | PACK | Freq: Once | INTRAVENOUS | Status: AC
  Administered 2024-04-12: 22.87 via INTRAVENOUS

## 2024-04-12 NOTE — Progress Notes (Signed)
 Patient presents for a cardiac PET stress test and tolerated procedure. She reported a headache, abdominal cramping, and flushing that lasted for about a minute. Patient maintained acceptable vital signs throughout the test and was offered caffeine after test.  Patient ambulated out of department with a steady gait.

## 2024-04-16 ENCOUNTER — Other Ambulatory Visit: Payer: Self-pay | Admitting: Psychiatry

## 2024-04-16 LAB — NM PET CT CARDIAC PERFUSION MULTI W/ABSOLUTE BLOODFLOW
MBFR: 1.8
Nuc Rest EF: 62 %
Nuc Stress EF: 69 %
Peak HR: 81 {beats}/min
Rest HR: 66 {beats}/min
Rest MBF: 1 ml/g/min
Rest Nuclear Isotope Dose: 23.4 mCi
SRS: 0
SSS: 1
ST Depression (mm): 0 mm
Stress MBF: 1.8 ml/g/min
Stress Nuclear Isotope Dose: 22.9 mCi
TID: 1.24

## 2024-04-26 ENCOUNTER — Telehealth: Payer: Self-pay | Admitting: *Deleted

## 2024-04-26 NOTE — Telephone Encounter (Signed)
 Patient requesting RF for Creon  from Dr. Babara. I reviewed her chart and call patient back. I advised pt to call the [facility] GI office with Dr. Unk, who prescribed this medication. I explained that Dr. Babara is only following her for the hemachromatosis.
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
## 2024-04-30 ENCOUNTER — Encounter: Admission: RE | Disposition: A | Payer: Self-pay | Attending: Internal Medicine

## 2024-04-30 ENCOUNTER — Ambulatory Visit: Admitting: Psychiatry

## 2024-04-30 ENCOUNTER — Ambulatory Visit
Admission: RE | Admit: 2024-04-30 | Discharge: 2024-04-30 | Disposition: A | Discharge status: Home / Self Care | Attending: Internal Medicine | Admitting: Internal Medicine

## 2024-04-30 ENCOUNTER — Other Ambulatory Visit: Payer: Self-pay

## 2024-04-30 ENCOUNTER — Encounter: Payer: Self-pay | Admitting: Internal Medicine

## 2024-04-30 DIAGNOSIS — Z87891 Personal history of nicotine dependence: Secondary | ICD-10-CM | POA: Diagnosis not present

## 2024-04-30 DIAGNOSIS — E782 Mixed hyperlipidemia: Secondary | ICD-10-CM | POA: Insufficient documentation

## 2024-04-30 DIAGNOSIS — Z7982 Long term (current) use of aspirin: Secondary | ICD-10-CM | POA: Insufficient documentation

## 2024-04-30 DIAGNOSIS — J449 Chronic obstructive pulmonary disease, unspecified: Secondary | ICD-10-CM | POA: Diagnosis not present

## 2024-04-30 DIAGNOSIS — Z794 Long term (current) use of insulin: Secondary | ICD-10-CM | POA: Diagnosis not present

## 2024-04-30 DIAGNOSIS — Z79899 Other long term (current) drug therapy: Secondary | ICD-10-CM | POA: Insufficient documentation

## 2024-04-30 DIAGNOSIS — I10 Essential (primary) hypertension: Secondary | ICD-10-CM

## 2024-04-30 DIAGNOSIS — R9439 Abnormal result of other cardiovascular function study: Secondary | ICD-10-CM | POA: Diagnosis present

## 2024-04-30 DIAGNOSIS — Z7985 Long-term (current) use of injectable non-insulin antidiabetic drugs: Secondary | ICD-10-CM | POA: Insufficient documentation

## 2024-04-30 DIAGNOSIS — I2510 Atherosclerotic heart disease of native coronary artery without angina pectoris: Secondary | ICD-10-CM | POA: Insufficient documentation

## 2024-04-30 DIAGNOSIS — E119 Type 2 diabetes mellitus without complications: Secondary | ICD-10-CM | POA: Insufficient documentation

## 2024-04-30 HISTORY — PX: LEFT HEART CATH AND CORONARY ANGIOGRAPHY: CATH118249

## 2024-04-30 LAB — GLUCOSE, CAPILLARY
Glucose-Capillary: 79 mg/dL (ref 70–99)
Glucose-Capillary: 62 mg/dL — ABNORMAL LOW (ref 70–99)
Glucose-Capillary: 163 mg/dL — ABNORMAL HIGH (ref 70–99)

## 2024-04-30 SURGERY — LEFT HEART CATH AND CORONARY ANGIOGRAPHY
Anesthesia: Moderate Sedation | Laterality: Left

## 2024-04-30 MED ORDER — SODIUM CHLORIDE 0.9% FLUSH: 3.0000 mL | INTRAVENOUS | Status: DC | PRN

## 2024-04-30 MED ORDER — LIDOCAINE HCL (PF) 1 % IJ SOLN
INTRAMUSCULAR | Status: DC | PRN
  Administered 2024-04-30: 2 mL

## 2024-04-30 MED ORDER — HEPARIN (PORCINE) IN NACL 1000-0.9 UT/500ML-% IV SOLN
INTRAVENOUS | Status: DC | PRN
  Administered 2024-04-30: 1000 mL

## 2024-04-30 MED ORDER — FREE WATER
500.0000 mL | Freq: Once | Status: AC
  Administered 2024-04-30: 500 mL via ORAL

## 2024-04-30 MED ORDER — HYDRALAZINE HCL 20 MG/ML IJ SOLN: 10.0000 mg | INTRAMUSCULAR | Status: DC | PRN

## 2024-04-30 MED ORDER — ASPIRIN 81 MG PO CHEW
CHEWABLE_TABLET | ORAL | Status: AC
  Filled 2024-04-30: qty 1

## 2024-04-30 MED ORDER — ASPIRIN 81 MG PO CHEW
81.0000 mg | CHEWABLE_TABLET | ORAL | Status: AC
  Administered 2024-04-30: 81 mg via ORAL

## 2024-04-30 MED ORDER — VERAPAMIL HCL 2.5 MG/ML IV SOLN
INTRAVENOUS | Status: AC
  Filled 2024-04-30: qty 2

## 2024-04-30 MED ORDER — SODIUM CHLORIDE 0.9 % IV SOLN: 250.0000 mL | INTRAVENOUS | Status: DC | PRN

## 2024-04-30 MED ORDER — MIDAZOLAM HCL (PF) 2 MG/2ML IJ SOLN
INTRAMUSCULAR | Status: DC | PRN
  Administered 2024-04-30: 1 mg via INTRAVENOUS

## 2024-04-30 MED ORDER — HEPARIN (PORCINE) IN NACL 1000-0.9 UT/500ML-% IV SOLN
INTRAVENOUS | Status: AC
  Filled 2024-04-30: qty 1000

## 2024-04-30 MED ORDER — DEXTROSE 50 % IV SOLN
INTRAVENOUS | Status: AC
  Administered 2024-04-30: 50 mL via INTRAVENOUS
  Filled 2024-04-30: qty 50

## 2024-04-30 MED ORDER — FENTANYL CITRATE (PF) 100 MCG/2ML IJ SOLN
INTRAMUSCULAR | Status: AC
  Filled 2024-04-30: qty 2

## 2024-04-30 MED ORDER — HEPARIN SODIUM (PORCINE) 1000 UNIT/ML IJ SOLN
INTRAMUSCULAR | Status: DC | PRN
  Administered 2024-04-30: 4000 [IU] via INTRAVENOUS

## 2024-04-30 MED ORDER — FREE WATER: 500.0000 mL | Freq: Once | Status: DC

## 2024-04-30 MED ORDER — SODIUM CHLORIDE 0.9% FLUSH: 3.0000 mL | Freq: Two times a day (BID) | INTRAVENOUS | Status: DC

## 2024-04-30 MED ORDER — DEXTROSE 50 % IV SOLN: 1.0000 | INTRAVENOUS | Status: AC

## 2024-04-30 MED ORDER — MIDAZOLAM HCL 2 MG/2ML IJ SOLN
INTRAMUSCULAR | Status: AC
  Filled 2024-04-30: qty 2

## 2024-04-30 MED ORDER — LIDOCAINE HCL 1 % IJ SOLN
INTRAMUSCULAR | Status: AC
  Filled 2024-04-30: qty 20

## 2024-04-30 MED ORDER — VERAPAMIL HCL 2.5 MG/ML IV SOLN
INTRAVENOUS | Status: DC | PRN
  Administered 2024-04-30: 2.5 mg via INTRA_ARTERIAL

## 2024-04-30 MED ORDER — FENTANYL CITRATE (PF) 100 MCG/2ML IJ SOLN
INTRAMUSCULAR | Status: DC | PRN
  Administered 2024-04-30: 25 ug via INTRAVENOUS

## 2024-04-30 MED ORDER — ACETAMINOPHEN 325 MG PO TABS: 650.0000 mg | ORAL_TABLET | ORAL | Status: DC | PRN

## 2024-04-30 MED ORDER — ONDANSETRON HCL 4 MG/2ML IJ SOLN: 4.0000 mg | Freq: Four times a day (QID) | INTRAMUSCULAR | Status: DC | PRN

## 2024-04-30 MED ORDER — HEPARIN SODIUM (PORCINE) 1000 UNIT/ML IJ SOLN
INTRAMUSCULAR | Status: AC
  Filled 2024-04-30: qty 10

## 2024-04-30 MED ORDER — IOHEXOL 300 MG/ML  SOLN
INTRAMUSCULAR | Status: DC | PRN
  Administered 2024-04-30: 59 mL

## 2024-04-30 MED ADMIN — Sodium Chloride IV Soln 0.9%: 250 mL | INTRAVENOUS | NDC 00264780020

## 2024-04-30 SURGICAL SUPPLY — 9 items
CATH 5FR JL3.5 JR4 ANG PIG MP (CATHETERS) IMPLANT
DEVICE RAD TR BAND REGULAR (VASCULAR PRODUCTS) IMPLANT
DRAPE BRACHIAL (DRAPES) IMPLANT
GLIDESHEATH SLEND SS 6F .021 (SHEATH) IMPLANT
GUIDEWIRE INQWIRE 1.5J.035X260 (WIRE) IMPLANT
PACK CARDIAC CATH (CUSTOM PROCEDURE TRAY) ×1 IMPLANT
SET ATX-X65L (MISCELLANEOUS) IMPLANT
STATION PROTECTION PRESSURIZED (MISCELLANEOUS) IMPLANT
WIRE HITORQ VERSACORE ST 145CM (WIRE) IMPLANT

## 2024-04-30 NOTE — Progress Notes (Signed)
 FABIENE Bloch, PA with Dr. Florencio in at bedside speaking with pt. And her daugher re: cath results And follow-up with Dr. Wilburn. Both verbalized understanding of conversation with PA.

## 2024-05-01 ENCOUNTER — Encounter: Payer: Self-pay | Admitting: Internal Medicine

## 2024-05-08 ENCOUNTER — Ambulatory Visit: Admitting: Psychiatry

## 2024-05-09 ENCOUNTER — Ambulatory Visit (INDEPENDENT_AMBULATORY_CARE_PROVIDER_SITE_OTHER): Admitting: Licensed Clinical Social Worker

## 2024-05-09 DIAGNOSIS — F4310 Post-traumatic stress disorder, unspecified: Secondary | ICD-10-CM | POA: Diagnosis not present

## 2024-05-09 DIAGNOSIS — F259 Schizoaffective disorder, unspecified: Secondary | ICD-10-CM | POA: Diagnosis not present

## 2024-05-09 DIAGNOSIS — F411 Generalized anxiety disorder: Secondary | ICD-10-CM | POA: Diagnosis not present

## 2024-05-09 NOTE — Progress Notes (Signed)
 "  THERAPIST PROGRESS NOTE  Session Time: 11:45-12:35pm  Participation Level: Active  Behavioral Response: CasualAlertAnxious  Type of Therapy: Individual Therapy  Treatment Goals addressed:  Active     BH CCP Acute or Chronic Trauma Reaction     LTG: Elimination of maladaptive behaviors and thinking patterns which interfere with resolution of trauma as evidenced by self report (Not Progressing)     Start:  01/31/24    Expected End:  09/06/24         STG: Have it not bother me anymore AEB decreased physical symptoms such as nausea arise with unwanted memories from the past.  (Not Progressing)     Start:  01/31/24    Expected End:  09/06/24         Cooperate with trauma-focused psychotherapy techniques to reduce emotional reaction to the traumatic event      Start:  01/31/24         Teach Angela Fernandez coping strategies (e.g., writing down thoughts and feelings in a journal; taking deep, slow breaths; calling a support person to talk about memories) to deal with trauma memories and sudden emotional reactions without becoming emotionally nu     Start:  01/31/24         Educate Angela Fernandez on trauma influenced cognitive distortions     Start:  01/31/24         Encourage Angela Fernandez to identify 2 trauma related cognitive distortions     Start:  01/31/24           OP Depression     LTG: Reduce frequency, intensity, and duration of depression symptoms so that daily functioning is improved (Progressing)     Start:  03/16/23    Expected End:  09/06/24       Goal Note     05/09/24: Patient reports she has noticed her mood improving more days than not citing overall improvements with feelings of depression and panic attacks.         STG: Angela Fernandez will identify cognitive patterns and beliefs that support depression (Progressing)     Start:  03/16/23    Expected End:  09/06/24         STG - Misc 1 (Not Applicable)     Start:  03/16/23    Expected End:  08/12/23    Resolved:   07/18/23   Pt reports she would like to expand her support system. Pt will accomplish this by calling friends starting out once a month form reports 0 times a month.     Goal Note     Blank         Work with Angela Fernandez to identify the major components of a recent episode of depression: physical symptoms, major thoughts and images, and major behaviors they experienced     Start:  03/16/23         Therapist will educate patient on cognitive distortions and the rationale for treatment of depression     Start:  03/16/23         Angela Fernandez will identify 3 trauma related cognitive distortions     Start:  03/16/23         Perform motivational interviewing regarding physical activity     Start:  03/16/23           Self Esteem:         STG - Other Self-Esteem Goal (Specify) (Not Progressing)     Start:  03/16/23    Expected End:  09/06/24  Pt will report improvements in self esteem    Goal Note     05/09/24: Patient continues to demonstrate difficulty and identifying negative internal dialogue reinstilled by negative feedback by others and thought problems related to mind-reading when it comes to believes of judgment by others.  Patient also continues to demonstrate a lack of improvement with the ways in which she talks to herself and negative labeling she engages in.         STG - Misc 1 (Not Progressing)     Start:  01/31/24    Expected End:  09/06/24       Increase showers from reported 1x/ every 5 days.       Identify opportunities to increase self-esteem     Start:  03/16/23            Coping Skills      Start:  03/16/23      Will Work with patient to decrease the frequency of negative self-descriptive statements and increase the frequency of positive self- descriptive statements using CBT/DBT/REBT techniques per patient self report 3 out of 5 documented sessions. Some of the techniques that will be used will be CBT, positive affirmations, role playing, modeling,  homework and journaling.           ProgressTowards Goals: Progressing  Interventions: Conservator, Museum/gallery, Supportive, Reframing, and Other: EMDR  Summary: Angela Fernandez is a 78 y.o. female who presents with symptoms of anxiety. Patient reports uncontrollable worry, irritability, nightmares, and racing thoughts. Pt was oriented times 5. Pt was cooperative and engaged. Pt denies SI/HI/AVH.  Patient continued EMDR reprocessing her Touch[place] memory.  Clinician worked with patient on reframing negative perceptions about her intelligence and self-worth as a result of negative feedback received by adults early in childhood.  Clinician worked with patient to challenge thought problems specifically related to patterns of mind-reading and jumping to conclusions.  Explored ways in which patient could engage in assertive communication to seek clarity related to interactions leading her to feel others do not accept her.  Suicidal/Homicidal: Nowithout intent/plan  Therapist Response: Clinician utilized active and supportive reflection to create a safe environment for patient to process recent events. Clinician assessed for current stressors, symptoms, safety since last session.  Worked with patient through EMDR to reprocess touchstone memory.  Role-played conversations in which patient can utilize assertive communication to seek out clarity in an effort to challenge evidence against mind-reading tactics.  Plan: Return again in 2 weeks.  Diagnosis: Schizoaffective disorder, unspecified type (HCC)  PTSD (post-traumatic stress disorder)  GAD (generalized anxiety disorder)   Collaboration of Care: AEB psychiatrist can access notes and cln. Will review psychiatrists' notes. Check in with the patient and will see LCSW per availability. Patient agreed with treatment recommendations.   Patient/Guardian was advised Release of Information must be obtained prior to any record release in order to collaborate  their care with an outside provider. Patient/Guardian was advised if they have not already done so to contact the registration department to sign all necessary forms in order for us  to release information regarding their care.   Consent: Patient/Guardian gives verbal consent for treatment and assignment of benefits for services provided during this visit. Patient/Guardian expressed understanding and agreed to proceed.   Angela KATHEE Husband, LCSW 05/09/2024  "

## 2024-05-15 ENCOUNTER — Other Ambulatory Visit: Payer: Self-pay | Admitting: Psychiatry

## 2024-05-16 ENCOUNTER — Other Ambulatory Visit: Payer: Self-pay | Admitting: Psychiatry

## 2024-05-16 ENCOUNTER — Telehealth: Payer: Self-pay

## 2024-05-16 LAB — CARDIAC CATHETERIZATION: Cath EF Quantitative: 55 %

## 2024-05-16 MED ORDER — CLONAZEPAM 0.5 MG PO TABS: ORAL_TABLET | ORAL | 1 refills | Status: AC

## 2024-05-16 NOTE — Telephone Encounter (Signed)
 Ordered

## 2024-05-16 NOTE — Telephone Encounter (Signed)
 Pt calling to requestclonazePAM Expired - clonazePAM  (KLONOPIN ) 0.5 MG tablet Pharmacy:[facility] [place], [place] - [address]  Last seen:02/20/24 Next apt:  06/19/24

## 2024-05-22 ENCOUNTER — Other Ambulatory Visit: Payer: Self-pay | Admitting: Infectious Diseases

## 2024-05-22 ENCOUNTER — Ambulatory Visit: Admitting: Licensed Clinical Social Worker

## 2024-05-22 DIAGNOSIS — Z1231 Encounter for screening mammogram for malignant neoplasm of breast: Secondary | ICD-10-CM

## 2024-06-05 ENCOUNTER — Ambulatory Visit: Admitting: Licensed Clinical Social Worker

## 2024-06-11 ENCOUNTER — Ambulatory Visit
Admission: RE | Admit: 2024-06-11 | Discharge: 2024-06-11 | Disposition: A | Source: Ambulatory Visit | Attending: Infectious Diseases | Admitting: Infectious Diseases

## 2024-06-11 DIAGNOSIS — Z1231 Encounter for screening mammogram for malignant neoplasm of breast: Secondary | ICD-10-CM
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
## 2024-06-19 ENCOUNTER — Ambulatory Visit: Admitting: Psychiatry

## 2024-06-21 ENCOUNTER — Ambulatory Visit: Admitting: Licensed Clinical Social Worker

## 2024-06-29 ENCOUNTER — Other Ambulatory Visit

## 2024-07-03 ENCOUNTER — Ambulatory Visit: Admitting: Oncology

## 2024-07-05 ENCOUNTER — Ambulatory Visit: Admitting: Licensed Clinical Social Worker

## 2024-11-19 ENCOUNTER — Ambulatory Visit: Admitting: Urology
